# Patient Record
Sex: Female | Born: 1950 | Race: Black or African American | Hispanic: No | Smoking: Never smoker
Health system: Southern US, Community
[De-identification: ages and names within clinical notes are randomized; demographics above are authoritative.]

## PROBLEM LIST (undated history)

## (undated) DIAGNOSIS — I10 Essential (primary) hypertension: Secondary | ICD-10-CM

## (undated) DIAGNOSIS — M1990 Unspecified osteoarthritis, unspecified site: Secondary | ICD-10-CM

## (undated) DIAGNOSIS — D649 Anemia, unspecified: Secondary | ICD-10-CM

## (undated) DIAGNOSIS — E7800 Pure hypercholesterolemia, unspecified: Secondary | ICD-10-CM

## (undated) DIAGNOSIS — T7840XA Allergy, unspecified, initial encounter: Secondary | ICD-10-CM

## (undated) DIAGNOSIS — N189 Chronic kidney disease, unspecified: Secondary | ICD-10-CM

## (undated) DIAGNOSIS — R252 Cramp and spasm: Secondary | ICD-10-CM

## (undated) DIAGNOSIS — I219 Acute myocardial infarction, unspecified: Secondary | ICD-10-CM

## (undated) DIAGNOSIS — G5621 Lesion of ulnar nerve, right upper limb: Secondary | ICD-10-CM

## (undated) DIAGNOSIS — J189 Pneumonia, unspecified organism: Secondary | ICD-10-CM

## (undated) DIAGNOSIS — R400 Somnolence: Secondary | ICD-10-CM

## (undated) DIAGNOSIS — J309 Allergic rhinitis, unspecified: Secondary | ICD-10-CM

## (undated) HISTORY — DX: Allergy, unspecified, initial encounter: T78.40XA

## (undated) HISTORY — DX: Somnolence: R40.0

## (undated) HISTORY — DX: Unspecified osteoarthritis, unspecified site: M19.90

## (undated) HISTORY — DX: Anemia, unspecified: D64.9

## (undated) HISTORY — DX: Cramp and spasm: R25.2

## (undated) HISTORY — DX: Lesion of ulnar nerve, right upper limb: G56.21

## (undated) HISTORY — DX: Allergic rhinitis, unspecified: J30.9

---

## 1998-12-18 DIAGNOSIS — I10 Essential (primary) hypertension: Secondary | ICD-10-CM

## 1998-12-18 HISTORY — DX: Essential (primary) hypertension: I10

## 1999-09-28 ENCOUNTER — Ambulatory Visit (HOSPITAL_COMMUNITY): Admission: RE | Admit: 1999-09-28 | Discharge: 1999-09-28 | Payer: Self-pay | Admitting: Internal Medicine

## 2000-03-28 ENCOUNTER — Emergency Department (HOSPITAL_COMMUNITY): Admission: EM | Admit: 2000-03-28 | Discharge: 2000-03-28 | Payer: Self-pay | Admitting: Emergency Medicine

## 2000-04-09 ENCOUNTER — Emergency Department (HOSPITAL_COMMUNITY): Admission: EM | Admit: 2000-04-09 | Discharge: 2000-04-09 | Payer: Self-pay | Admitting: Emergency Medicine

## 2000-10-29 ENCOUNTER — Emergency Department (HOSPITAL_COMMUNITY): Admission: EM | Admit: 2000-10-29 | Discharge: 2000-10-29 | Payer: Self-pay | Admitting: Emergency Medicine

## 2000-10-29 ENCOUNTER — Encounter: Payer: Self-pay | Admitting: Emergency Medicine

## 2001-04-05 ENCOUNTER — Ambulatory Visit (HOSPITAL_COMMUNITY): Admission: RE | Admit: 2001-04-05 | Discharge: 2001-04-05 | Payer: Self-pay | Admitting: *Deleted

## 2001-04-09 ENCOUNTER — Encounter: Admission: RE | Admit: 2001-04-09 | Discharge: 2001-04-09 | Payer: Self-pay | Admitting: Obstetrics & Gynecology

## 2001-12-12 ENCOUNTER — Encounter: Payer: Self-pay | Admitting: Family Medicine

## 2001-12-12 ENCOUNTER — Ambulatory Visit (HOSPITAL_COMMUNITY): Admission: RE | Admit: 2001-12-12 | Discharge: 2001-12-12 | Payer: Self-pay | Admitting: Family Medicine

## 2003-06-08 ENCOUNTER — Encounter: Admission: RE | Admit: 2003-06-08 | Discharge: 2003-06-08 | Payer: Self-pay | Admitting: Family Medicine

## 2003-06-30 ENCOUNTER — Encounter: Admission: RE | Admit: 2003-06-30 | Discharge: 2003-06-30 | Payer: Self-pay | Admitting: Family Medicine

## 2003-07-02 ENCOUNTER — Encounter: Admission: RE | Admit: 2003-07-02 | Discharge: 2003-07-02 | Payer: Self-pay | Admitting: Family Medicine

## 2003-07-21 ENCOUNTER — Encounter: Admission: RE | Admit: 2003-07-21 | Discharge: 2003-07-21 | Payer: Self-pay | Admitting: Family Medicine

## 2003-07-29 ENCOUNTER — Encounter: Admission: RE | Admit: 2003-07-29 | Discharge: 2003-07-29 | Payer: Self-pay | Admitting: Family Medicine

## 2003-08-18 ENCOUNTER — Encounter: Admission: RE | Admit: 2003-08-18 | Discharge: 2003-08-18 | Payer: Self-pay | Admitting: Family Medicine

## 2003-08-20 ENCOUNTER — Encounter: Admission: RE | Admit: 2003-08-20 | Discharge: 2003-08-20 | Payer: Self-pay | Admitting: Family Medicine

## 2003-09-28 ENCOUNTER — Encounter: Admission: RE | Admit: 2003-09-28 | Discharge: 2003-09-28 | Payer: Self-pay | Admitting: Family Medicine

## 2003-10-12 ENCOUNTER — Encounter: Admission: RE | Admit: 2003-10-12 | Discharge: 2003-10-12 | Payer: Self-pay | Admitting: Family Medicine

## 2003-11-17 ENCOUNTER — Encounter: Admission: RE | Admit: 2003-11-17 | Discharge: 2003-11-17 | Payer: Self-pay | Admitting: Family Medicine

## 2004-01-21 ENCOUNTER — Encounter: Admission: RE | Admit: 2004-01-21 | Discharge: 2004-01-21 | Payer: Self-pay | Admitting: Family Medicine

## 2004-05-05 ENCOUNTER — Encounter: Admission: RE | Admit: 2004-05-05 | Discharge: 2004-05-05 | Payer: Self-pay | Admitting: Sports Medicine

## 2004-08-26 ENCOUNTER — Ambulatory Visit: Payer: Self-pay | Admitting: Family Medicine

## 2004-08-29 ENCOUNTER — Ambulatory Visit: Payer: Self-pay | Admitting: Family Medicine

## 2004-09-23 ENCOUNTER — Ambulatory Visit: Payer: Self-pay | Admitting: Family Medicine

## 2004-12-30 ENCOUNTER — Ambulatory Visit: Payer: Self-pay | Admitting: Sports Medicine

## 2005-01-09 ENCOUNTER — Ambulatory Visit: Payer: Self-pay | Admitting: Family Medicine

## 2005-01-11 ENCOUNTER — Ambulatory Visit: Payer: Self-pay | Admitting: Family Medicine

## 2005-05-13 ENCOUNTER — Emergency Department (HOSPITAL_COMMUNITY): Admission: EM | Admit: 2005-05-13 | Discharge: 2005-05-13 | Payer: Self-pay | Admitting: Emergency Medicine

## 2005-05-18 ENCOUNTER — Ambulatory Visit: Payer: Self-pay | Admitting: Family Medicine

## 2005-05-19 ENCOUNTER — Ambulatory Visit: Payer: Self-pay | Admitting: Family Medicine

## 2005-05-22 ENCOUNTER — Ambulatory Visit: Payer: Self-pay | Admitting: Sports Medicine

## 2005-05-30 ENCOUNTER — Ambulatory Visit: Payer: Self-pay | Admitting: Sports Medicine

## 2005-06-19 ENCOUNTER — Ambulatory Visit: Payer: Self-pay | Admitting: Family Medicine

## 2005-06-27 ENCOUNTER — Ambulatory Visit: Payer: Self-pay | Admitting: Family Medicine

## 2005-06-27 ENCOUNTER — Encounter (INDEPENDENT_AMBULATORY_CARE_PROVIDER_SITE_OTHER): Payer: Self-pay | Admitting: Specialist

## 2005-07-03 ENCOUNTER — Ambulatory Visit: Payer: Self-pay | Admitting: Sports Medicine

## 2005-09-01 ENCOUNTER — Ambulatory Visit (HOSPITAL_COMMUNITY): Admission: RE | Admit: 2005-09-01 | Discharge: 2005-09-01 | Payer: Self-pay | Admitting: Sports Medicine

## 2005-09-14 ENCOUNTER — Encounter: Admission: RE | Admit: 2005-09-14 | Discharge: 2005-09-14 | Payer: Self-pay | Admitting: Sports Medicine

## 2005-11-08 ENCOUNTER — Ambulatory Visit: Payer: Self-pay | Admitting: Family Medicine

## 2006-09-17 ENCOUNTER — Encounter (INDEPENDENT_AMBULATORY_CARE_PROVIDER_SITE_OTHER): Payer: Self-pay | Admitting: *Deleted

## 2006-10-10 ENCOUNTER — Encounter (INDEPENDENT_AMBULATORY_CARE_PROVIDER_SITE_OTHER): Payer: Self-pay | Admitting: *Deleted

## 2006-10-10 ENCOUNTER — Ambulatory Visit: Payer: Self-pay | Admitting: Sports Medicine

## 2006-10-17 ENCOUNTER — Ambulatory Visit: Payer: Self-pay | Admitting: Family Medicine

## 2006-11-30 ENCOUNTER — Ambulatory Visit: Payer: Self-pay | Admitting: Sports Medicine

## 2006-12-07 ENCOUNTER — Ambulatory Visit: Payer: Self-pay | Admitting: Family Medicine

## 2007-02-14 DIAGNOSIS — E669 Obesity, unspecified: Secondary | ICD-10-CM | POA: Insufficient documentation

## 2007-02-14 DIAGNOSIS — M129 Arthropathy, unspecified: Secondary | ICD-10-CM | POA: Insufficient documentation

## 2007-02-14 DIAGNOSIS — I10 Essential (primary) hypertension: Secondary | ICD-10-CM | POA: Insufficient documentation

## 2007-02-14 DIAGNOSIS — G4700 Insomnia, unspecified: Secondary | ICD-10-CM | POA: Insufficient documentation

## 2007-02-14 DIAGNOSIS — E785 Hyperlipidemia, unspecified: Secondary | ICD-10-CM | POA: Insufficient documentation

## 2007-02-14 DIAGNOSIS — E1140 Type 2 diabetes mellitus with diabetic neuropathy, unspecified: Secondary | ICD-10-CM | POA: Insufficient documentation

## 2007-02-15 ENCOUNTER — Encounter (INDEPENDENT_AMBULATORY_CARE_PROVIDER_SITE_OTHER): Payer: Self-pay | Admitting: *Deleted

## 2007-02-19 ENCOUNTER — Telehealth (INDEPENDENT_AMBULATORY_CARE_PROVIDER_SITE_OTHER): Payer: Self-pay

## 2007-02-19 ENCOUNTER — Ambulatory Visit: Payer: Self-pay | Admitting: Family Medicine

## 2007-02-19 ENCOUNTER — Telehealth: Payer: Self-pay | Admitting: *Deleted

## 2007-02-19 DIAGNOSIS — J309 Allergic rhinitis, unspecified: Secondary | ICD-10-CM

## 2007-02-19 HISTORY — DX: Allergic rhinitis, unspecified: J30.9

## 2007-02-22 ENCOUNTER — Telehealth: Payer: Self-pay | Admitting: *Deleted

## 2007-04-29 ENCOUNTER — Ambulatory Visit: Payer: Self-pay | Admitting: Sports Medicine

## 2007-05-01 ENCOUNTER — Ambulatory Visit: Payer: Self-pay | Admitting: Family Medicine

## 2007-10-28 ENCOUNTER — Encounter (INDEPENDENT_AMBULATORY_CARE_PROVIDER_SITE_OTHER): Payer: Self-pay | Admitting: Family Medicine

## 2008-01-20 ENCOUNTER — Encounter (INDEPENDENT_AMBULATORY_CARE_PROVIDER_SITE_OTHER): Payer: Self-pay | Admitting: Family Medicine

## 2008-01-20 ENCOUNTER — Ambulatory Visit: Payer: Self-pay | Admitting: Family Medicine

## 2008-01-20 LAB — CONVERTED CEMR LAB
ALT: 11 units/L (ref 0–35)
Albumin: 4.2 g/dL (ref 3.5–5.2)
BUN: 10 mg/dL (ref 6–23)
Creatinine, Ser: 0.7 mg/dL (ref 0.40–1.20)
Direct LDL: 150 mg/dL — ABNORMAL HIGH
Glucose, Bld: 219 mg/dL — ABNORMAL HIGH (ref 70–99)
Total Protein: 7.2 g/dL (ref 6.0–8.3)

## 2008-01-21 ENCOUNTER — Encounter (INDEPENDENT_AMBULATORY_CARE_PROVIDER_SITE_OTHER): Payer: Self-pay | Admitting: Family Medicine

## 2008-01-24 ENCOUNTER — Telehealth: Payer: Self-pay | Admitting: *Deleted

## 2008-02-04 ENCOUNTER — Telehealth (INDEPENDENT_AMBULATORY_CARE_PROVIDER_SITE_OTHER): Payer: Self-pay | Admitting: Family Medicine

## 2008-02-05 ENCOUNTER — Telehealth: Payer: Self-pay | Admitting: *Deleted

## 2008-02-10 ENCOUNTER — Encounter: Payer: Self-pay | Admitting: *Deleted

## 2008-02-10 ENCOUNTER — Telehealth (INDEPENDENT_AMBULATORY_CARE_PROVIDER_SITE_OTHER): Payer: Self-pay | Admitting: Family Medicine

## 2008-02-11 ENCOUNTER — Telehealth: Payer: Self-pay | Admitting: *Deleted

## 2008-02-12 ENCOUNTER — Telehealth: Payer: Self-pay | Admitting: *Deleted

## 2008-02-19 ENCOUNTER — Encounter: Payer: Self-pay | Admitting: *Deleted

## 2008-02-21 ENCOUNTER — Ambulatory Visit (HOSPITAL_COMMUNITY): Admission: RE | Admit: 2008-02-21 | Discharge: 2008-02-21 | Payer: Self-pay | Admitting: Internal Medicine

## 2008-06-02 ENCOUNTER — Telehealth: Payer: Self-pay | Admitting: *Deleted

## 2008-06-04 ENCOUNTER — Encounter (INDEPENDENT_AMBULATORY_CARE_PROVIDER_SITE_OTHER): Payer: Self-pay | Admitting: Family Medicine

## 2008-07-17 ENCOUNTER — Telehealth: Payer: Self-pay | Admitting: *Deleted

## 2008-11-27 ENCOUNTER — Encounter: Payer: Self-pay | Admitting: Family Medicine

## 2009-06-29 ENCOUNTER — Ambulatory Visit (HOSPITAL_COMMUNITY): Admission: RE | Admit: 2009-06-29 | Discharge: 2009-06-29 | Payer: Self-pay | Admitting: Internal Medicine

## 2009-08-19 ENCOUNTER — Inpatient Hospital Stay (HOSPITAL_COMMUNITY): Admission: EM | Admit: 2009-08-19 | Discharge: 2009-08-21 | Payer: Self-pay | Admitting: Emergency Medicine

## 2009-12-01 ENCOUNTER — Emergency Department (HOSPITAL_COMMUNITY): Admission: EM | Admit: 2009-12-01 | Discharge: 2009-12-01 | Payer: Self-pay | Admitting: Family Medicine

## 2010-01-20 ENCOUNTER — Encounter: Payer: Self-pay | Admitting: Family Medicine

## 2010-01-20 ENCOUNTER — Ambulatory Visit: Payer: Self-pay | Admitting: Family Medicine

## 2010-01-20 LAB — CONVERTED CEMR LAB
ALT: 13 units/L (ref 0–35)
AST: 20 units/L (ref 0–37)
Creatinine, Ser: 0.64 mg/dL (ref 0.40–1.20)
HDL: 46 mg/dL (ref >39)
Hgb A1c MFr Bld: 7.3 %
LDL Cholesterol: 153 mg/dL — ABNORMAL HIGH (ref 0–99)
Potassium: 4.1 meq/L (ref 3.5–5.3)
Sodium: 138 meq/L (ref 135–145)
Total Bilirubin: 0.5 mg/dL (ref 0.3–1.2)
Total Protein: 7.3 g/dL (ref 6.0–8.3)
Triglycerides: 130 mg/dL (ref <150)
VLDL: 26 mg/dL (ref 0–40)

## 2010-03-25 ENCOUNTER — Telehealth: Payer: Self-pay | Admitting: *Deleted

## 2010-04-21 ENCOUNTER — Ambulatory Visit: Payer: Self-pay | Admitting: Family Medicine

## 2010-05-09 ENCOUNTER — Ambulatory Visit: Payer: Self-pay | Admitting: Internal Medicine

## 2010-05-09 LAB — CONVERTED CEMR LAB
AST: 18 units/L (ref 0–37)
Albumin: 4.4 g/dL (ref 3.5–5.2)
Alkaline Phosphatase: 55 units/L (ref 39–117)
Basophils Relative: 0 % (ref 0–1)
CO2: 24 meq/L (ref 19–32)
Calcium: 9.9 mg/dL (ref 8.4–10.5)
Cholesterol: 265 mg/dL — ABNORMAL HIGH (ref 0–200)
Eosinophils Absolute: 0.2 10*3/uL (ref 0.0–0.7)
Eosinophils Relative: 3 % (ref 0–5)
Glucose, Bld: 327 mg/dL — ABNORMAL HIGH (ref 70–99)
HDL: 61 mg/dL (ref >39)
LDL Cholesterol: 172 mg/dL — ABNORMAL HIGH (ref 0–99)
Lymphs Abs: 2.3 10*3/uL (ref 0.7–4.0)
MCHC: 32.1 g/dL (ref 30.0–36.0)
MCV: 86.1 fL (ref 78.0–100.0)
Neutro Abs: 3.1 10*3/uL (ref 1.7–7.7)
Potassium: 4 meq/L (ref 3.5–5.3)
Sodium: 137 meq/L (ref 135–145)
Total Bilirubin: 0.5 mg/dL (ref 0.3–1.2)
WBC: 5.9 10*3/uL (ref 4.0–10.5)

## 2010-05-12 ENCOUNTER — Ambulatory Visit: Payer: Self-pay | Admitting: Internal Medicine

## 2010-05-23 ENCOUNTER — Ambulatory Visit: Payer: Self-pay | Admitting: Internal Medicine

## 2010-06-01 ENCOUNTER — Ambulatory Visit: Payer: Self-pay | Admitting: Family Medicine

## 2010-06-03 ENCOUNTER — Ambulatory Visit: Payer: Self-pay | Admitting: Internal Medicine

## 2010-11-07 ENCOUNTER — Emergency Department (HOSPITAL_COMMUNITY)
Admission: EM | Admit: 2010-11-07 | Discharge: 2010-11-07 | Payer: Self-pay | Source: Home / Self Care | Admitting: Emergency Medicine

## 2010-11-07 ENCOUNTER — Emergency Department (HOSPITAL_COMMUNITY)
Admission: EM | Admit: 2010-11-07 | Discharge: 2010-11-07 | Disposition: A | Discharge status: Other Acute Inpatient Hospital | Payer: Self-pay | Source: Home / Self Care | Admitting: Family Medicine

## 2010-12-09 ENCOUNTER — Ambulatory Visit (HOSPITAL_COMMUNITY)
Admission: RE | Admit: 2010-12-09 | Discharge: 2010-12-09 | Payer: Self-pay | Source: Home / Self Care | Attending: Family Medicine | Admitting: Family Medicine

## 2011-01-19 NOTE — Assessment & Plan Note (Signed)
Summary: f/u HTN, HLD, DM   Vital Signs:  Patient profile:   60 year old female Height:      66 inches Weight:      219.1 pounds BMI:     35.49 Temp:     97.9 degrees F oral Pulse rate:   83 / minute Pulse rhythm:   regular BP sitting:   159 / 80  (right arm) Cuff size:   large  Vitals Entered By: Audelia Hives CMA (January 20, 2010 11:02 AM)  Nutrition Counseling: Patient's BMI is greater than 25 and therefore counseled on weight management options.  CC:  re-establish/follow up DM, HTN, and HLD.  History of Present Illness: 60 y/o female here to re-establish care/follow up chronic issues. no complaints  DM - doesn't check blood sugars and has been out of glucophage for a while.  denies polyuria, polydipsia.   HTN: No shortness of breath, chest pain, edema.  has been out of BP med. took daughters benicar.   HLD: previously on pravastatin. ran out of that as well.   Obesity- lost  ~16 lbs since last visit. eats a lot of chocolate. not doing as much exercise 2/2 weather  Current Medications (verified): 1)  None  Allergies (verified): No Known Drug Allergies  Past History:  Past medical history reviewed for relevance to current acute and chronic problems.  Past Medical History: Allergic rhinitis HTN HLD DM  Social History: Lives alone.  out of work.  No etoh/drugs/tob.  Physical Exam  General:  obese female, NAD. vitals reviewed.  Eyes:  EOMI, PERRLA Ears:  hearing grossly normal Nose:  no rhinorrhea Mouth:  MMM Neck:  no carotid bruits or thyromegaly Lungs:  Normal respiratory effort, chest expands symmetrically. Lungs are clear to auscultation, no crackles or wheezes. Heart:  Normal rate and regular rhythm. S1 and S2 normal without gallop, murmur, click, rub or other extra sounds. Abdomen:  obese, NT, ND, +BS Extremities:  No clubbing, cyanosis, edema, or deformity noted with normal full range of motion of all joints.     Impression &  Recommendations:  Problem # 1:  DIABETES MELLITUS II, UNCOMPLICATED ([id number]) Assessment Improved A1C improved despite being off medication. will refill glucophage. unable to refer for eye testing since uninsured. will check microalbumin and feet at next visit  The following medications were removed from the medication list:    Glucophage 500 Mg Tabs (Metformin hcl) .Marland Kitchen... Take 1 tablet by mouth twice a day    Zestoretic 10-12.5 Mg Tabs (Lisinopril-hydrochlorothiazide)  Orders: Comp Met-[facility] [id number]) Lipid-[facility] [id number]) A1C-[facility] [id number]) [place]- Est  Level 4 [id number])  Problem # 2:  HYPERTENSION, BENIGN SYSTEMIC (ICD-401.1) Assessment: Unchanged  refill medication.   The following medications were removed from the medication list:    Zestoretic 10-12.5 Mg Tabs (Lisinopril-hydrochlorothiazide)  Orders: [place]- Est  Level 4 [id number])  Problem # 3:  HYPERLIPIDEMIA (ICD-272.4) Assessment: Unchanged  check direct LDL  The following medications were removed from the medication list:    Pravachol 20 Mg Tabs (Pravastatin sodium) .Marland KitchenMarland KitchenMarland KitchenMarland Kitchen 4 by mouth qhs  Orders: [place]- Est  Level 4 [id number])  Patient Instructions: 1)  Nice to have met you! 2)  Follow up with Dr. Drue Flirt in 6 weeks.    Prevention & Chronic Care Immunizations   Influenza vaccine: Not documented    Tetanus booster: 06/17/2005: Done.   Tetanus booster due: 06/18/2015    Pneumococcal vaccine: Not documented  Colorectal Screening   Hemoccult: Done.  (09/17/2006)   Hemoccult  due: 09/18/2007    Colonoscopy: Not documented  Other Screening   Pap smear: normal  (01/20/2008)   Pap smear due: 01/2009    Mammogram: normal  (02/26/2008)   Mammogram due: 02/25/2009   Smoking status: never  (02/19/2007)  Diabetes Mellitus   HgbA1C: 7.3  (01/20/2010)   Hemoglobin A1C due: 04/18/2008    Eye exam: Not documented    Foot exam: Not documented   High risk foot: Not documented   Foot care education: Not  documented    Urine microalbumin/creatinine ratio: Not documented    Diabetes flowsheet reviewed?: Yes   Progress toward A1C goal: Improved  Lipids   Total Cholesterol: Not documented   LDL: Not documented   LDL Direct: 150  (01/20/2008)   HDL: Not documented   Triglycerides: Not documented    SGOT (AST): 17  (01/20/2008)   SGPT (ALT): 11  (01/20/2008) CMP ordered    Alkaline phosphatase: 62  (01/20/2008)   Total bilirubin: 0.6  (01/20/2008)    Lipid flowsheet reviewed?: Yes   Progress toward LDL goal: Unchanged  Hypertension   Last Blood Pressure: 159 / 80  (01/20/2010)   Serum creatinine: 0.70  (01/20/2008)   Serum potassium 3.7  (01/20/2008) CMP ordered     Hypertension flowsheet reviewed?: Yes   Progress toward BP goal: Improved  Self-Management Support :   Personal Goals (by the next clinic visit) :     Personal A1C goal: 7  (01/20/2010)     Personal blood pressure goal: 130/80  (01/20/2010)     Personal LDL goal: 100  (01/20/2010)    Patient will work on the following items until the next clinic visit to reach self-care goals:     Medications and monitoring: take my medicines every day, check my blood sugar, examine my feet every day  (01/20/2010)     Eating: eat foods that are low in salt, eat baked foods instead of fried foods, eat fruit for snacks and desserts  (01/20/2010)     Activity: take a 30 minute walk every day  (01/20/2010)    Diabetes self-management support: Copy of home glucose meter record, CBG self-monitoring log, Written self-care plan  (01/20/2010)   Diabetes care plan printed    Hypertension self-management support: BP self-monitoring log, Written self-care plan  (01/20/2010)   Hypertension self-care plan printed.    Lipid self-management support: Lipid monitoring log, Written self-care plan  (01/20/2010)   Lipid self-care plan printed.   Laboratory Results   Blood Tests   Date/Time Recieved: January 20, 2010 11:25 AM  Date/Time  Reported: January 20, 2010 3:31 PM   HGBA1C: 7.3%   (Normal Range: Non-Diabetic - 3-6%   Control Diabetic - 6-8%)  Comments: ...............test performed by......Marland KitchenBonnie A. Martinique, MLS (ASCP)cm

## 2011-01-19 NOTE — Progress Notes (Signed)
Summary: cxl appt  Phone Note Call from Patient Call back at Home Phone [phone]   Caller: Patient Summary of Call: pt called to cancel appt for today and has an appt at [facility] on 5/5.  She is going to switch to them because of health problems with her daughter?  Initial call taken by: Audie Clear,  March 25, 2010 9:22 AM  Follow-up for Phone Call        I will make the changes in [facility] and [place]. Follow-up by: Elray Mcgregor RN,  March 26, 2010 8:08 AM    noted.  Nancy Nordmann  MD  March 25, 2010 9:31 AM

## 2011-02-28 LAB — DIFFERENTIAL
Eosinophils Absolute: 0.1 10*3/uL (ref 0.0–0.7)
Eosinophils Relative: 1 % (ref 0–5)
Lymphs Abs: 3.2 10*3/uL (ref 0.7–4.0)
Monocytes Absolute: 0.4 10*3/uL (ref 0.1–1.0)
Monocytes Relative: 5 % (ref 3–12)

## 2011-02-28 LAB — COMPREHENSIVE METABOLIC PANEL
ALT: 13 U/L (ref 0–35)
Albumin: 4 g/dL (ref 3.5–5.2)
BUN: 9 mg/dL (ref 6–23)
CO2: 29 mEq/L (ref 19–32)
Calcium: 9.8 mg/dL (ref 8.4–10.5)
Chloride: 103 mEq/L (ref 96–112)
Glucose, Bld: 61 mg/dL — ABNORMAL LOW (ref 70–99)
Potassium: 2.9 mEq/L — ABNORMAL LOW (ref 3.5–5.1)
Sodium: 139 mEq/L (ref 135–145)
Total Bilirubin: 0.4 mg/dL (ref 0.3–1.2)

## 2011-02-28 LAB — GLUCOSE, CAPILLARY: Glucose-Capillary: 63 mg/dL — ABNORMAL LOW (ref 70–99)

## 2011-02-28 LAB — CK TOTAL AND CKMB (NOT AT ARMC)
CK, MB: 1.1 ng/mL (ref 0.3–4.0)
Total CK: 95 U/L (ref 7–177)

## 2011-02-28 LAB — TROPONIN I: Troponin I: 0.01 ng/mL (ref 0.00–0.06)

## 2011-02-28 LAB — CBC
MCV: 87.5 fL (ref 78.0–100.0)
Platelets: 313 10*3/uL (ref 150–400)
RDW: 13.2 % (ref 11.5–15.5)

## 2011-03-24 LAB — CLOSTRIDIUM DIFFICILE EIA
C difficile Toxins A+B, EIA: NEGATIVE
C difficile Toxins A+B, EIA: NEGATIVE

## 2011-03-24 LAB — STOOL CULTURE

## 2011-03-24 LAB — CBC
HCT: 30.1 % — ABNORMAL LOW (ref 36.0–46.0)
HCT: 36.4 % (ref 36.0–46.0)
Hemoglobin: 12.1 g/dL (ref 12.0–15.0)
Hemoglobin: 13.3 g/dL (ref 12.0–15.0)
MCHC: 33.1 g/dL (ref 30.0–36.0)
MCHC: 33.4 g/dL (ref 30.0–36.0)
MCV: 88 fL (ref 78.0–100.0)
MCV: 88.1 fL (ref 78.0–100.0)
MCV: 88.2 fL (ref 78.0–100.0)
Platelets: 255 10*3/uL (ref 150–400)
Platelets: 304 10*3/uL (ref 150–400)
RBC: 4.14 MIL/uL (ref 3.87–5.11)
WBC: 14.4 10*3/uL — ABNORMAL HIGH (ref 4.0–10.5)
WBC: 18.9 10*3/uL — ABNORMAL HIGH (ref 4.0–10.5)
WBC: 23.6 10*3/uL — ABNORMAL HIGH (ref 4.0–10.5)

## 2011-03-24 LAB — COMPREHENSIVE METABOLIC PANEL
ALT: 15 U/L (ref 0–35)
AST: 26 U/L (ref 0–37)
Albumin: 3.4 g/dL — ABNORMAL LOW (ref 3.5–5.2)
Alkaline Phosphatase: 86 U/L (ref 39–117)
BUN: 13 mg/dL (ref 6–23)
Calcium: 8.9 mg/dL (ref 8.4–10.5)
Chloride: 101 mEq/L (ref 96–112)
Creatinine, Ser: 0.75 mg/dL (ref 0.4–1.2)
GFR calc Af Amer: >60 mL/min (ref >60)
Potassium: 2.8 mEq/L — ABNORMAL LOW (ref 3.5–5.1)
Sodium: 136 mEq/L (ref 135–145)
Total Bilirubin: 0.4 mg/dL (ref 0.3–1.2)
Total Protein: 7.2 g/dL (ref 6.0–8.3)

## 2011-03-24 LAB — LACTIC ACID, PLASMA: Lactic Acid, Venous: 1 mmol/L (ref 0.5–2.2)

## 2011-03-24 LAB — URINALYSIS, ROUTINE W REFLEX MICROSCOPIC
Specific Gravity, Urine: 1.027 (ref 1.005–1.030)
pH: 6 (ref 5.0–8.0)

## 2011-03-24 LAB — DIFFERENTIAL
Basophils Relative: 0 % (ref 0–1)
Lymphocytes Relative: 21 % (ref 12–46)
Lymphs Abs: 5 10*3/uL — ABNORMAL HIGH (ref 0.7–4.0)
Monocytes Relative: 2 % — ABNORMAL LOW (ref 3–12)

## 2011-03-24 LAB — OVA AND PARASITE EXAMINATION
Ova and parasites: NONE SEEN
Ova and parasites: NONE SEEN

## 2011-03-24 LAB — BASIC METABOLIC PANEL
BUN: 6 mg/dL (ref 6–23)
CO2: 27 mEq/L (ref 19–32)
Chloride: 107 mEq/L (ref 96–112)
Chloride: 112 mEq/L (ref 96–112)
GFR calc Af Amer: >60 mL/min (ref >60)
GFR calc non Af Amer: >60 mL/min (ref >60)
Potassium: 3.4 mEq/L — ABNORMAL LOW (ref 3.5–5.1)
Potassium: 3.6 mEq/L (ref 3.5–5.1)
Sodium: 139 mEq/L (ref 135–145)

## 2011-03-24 LAB — GLUCOSE, CAPILLARY
Glucose-Capillary: 126 mg/dL — ABNORMAL HIGH (ref 70–99)
Glucose-Capillary: 132 mg/dL — ABNORMAL HIGH (ref 70–99)
Glucose-Capillary: 216 mg/dL — ABNORMAL HIGH (ref 70–99)

## 2011-03-24 LAB — HEMOGLOBIN A1C
Hgb A1c MFr Bld: 7.9 % — ABNORMAL HIGH (ref 4.6–6.1)
Mean Plasma Glucose: 180 mg/dL

## 2011-03-24 LAB — HEMOCCULT GUIAC POC 1CARD (OFFICE): Fecal Occult Bld: NEGATIVE

## 2011-03-24 LAB — MAGNESIUM: Magnesium: 1.6 mg/dL (ref 1.5–2.5)

## 2011-05-02 NOTE — Consult Note (Signed)
NAMEMARLA, DELGIUDICE             ACCOUNT NO.:  [id number]   MEDICAL RECORD NO.:  [id number]          PATIENT TYPE:  EMS   LOCATION:  ED                           FACILITY:  [facility]   PHYSICIAN:  John C. Amedeo Plenty, M.D.    DATE OF BIRTH:  September 08, 1951   DATE OF CONSULTATION:  08/19/2009  DATE OF DISCHARGE:                                 CONSULTATION   REASON FOR CONSULTATION:  Diarrhea, leukocytosis and mesenteric  adenitis.   HISTORY OF PRESENT ILLNESS:  The patient is a 60 year old black female  who presents with a 1-1/2-week history of watery nonbloody diarrhea.  She has had nocturnal diarrhea and some episodes of incontinence,  particularly this morning.  She has had occasional vomiting primarily  near the onset which has resolved with some residual nausea.  She has  also had  fairly mild intermittent epigastric pain usually after eating.  She denies any fever or blood.  She denies any recent antibiotics,  recent travel, does not get her drinking water from a well, denies any  sick contacts or suspicious food ingestion that she had can recall.  Her  evaluation revealed a WBC count of 23,600, potassium 2.8 and a CT scan  showing some mesenteric adenopathy around the right colon as well as a  distended gallbladder with no other findings.  Her stool was heme  negative.  She has never had previous similar symptoms before.   PAST MEDICAL HISTORY:  Hypertension and non-insulin-dependent diabetes.   PAST SURGICAL HISTORY:  None.   MEDICATIONS:  Lisinopril and metformin.   ALLERGIES:  Levaquin.   FAMILY HISTORY:  Positive for diabetes and hypertension.  Grandmother  had breast cancer.   SOCIAL HISTORY:  The patient denies alcohol or tobacco use.   PHYSICAL EXAMINATION:  Obese black female alert, oriented, in no acute  distress.  HEART:  Regular rate and rhythm without murmur.  LUNGS:  Clear.  ABDOMEN:  Soft, nondistended with normoactive bowel sounds.  No  hepatosplenomegaly, mass  or guarding.  There is only some mild  tenderness in the suprapubic area.  Otherwise nontender.   IMPRESSION:  Probable bacterial enterocolitis or parasitic  enterocolitis.   PLAN:  Obtain full stool studies for enteric pathogens and then  recommend broad-spectrum antibiotics with Cipro and Flagyl or their  equivalent.           ______________________________  Elyse Jarvis. Amedeo Plenty, M.D.     JCH/MEDQ  D:  08/19/2009  T:  08/19/2009  Job:  [id number]

## 2011-05-02 NOTE — Consult Note (Signed)
NAMEJENNIFFER, Simmons             ACCOUNT NO.:  [id number]   MEDICAL RECORD NO.:  [id number]          PATIENT TYPE:  INP   LOCATION:  0108                         FACILITY:  [facility]   PHYSICIAN:  Marland Kitchen T. Simmons, M.D.DATE OF BIRTH:  May 11, 1951   DATE OF CONSULTATION:  08/19/2009  DATE OF DISCHARGE:                                 CONSULTATION   CONSULT REQUESTED BY:  [facility] Team.   CHIEF COMPLAINT:  Diarrhea, nausea.   PRESENT ILLNESS:  I was asked by the hospitalist team to evaluate Ms.  Simmons due to a questionably abnormal gallbladder seen on CT scan today.  The patient presents with a chief complaint of diarrhea which has been  present for 1 week.  She describes several loose or watery stools a day  without mucous or blood.  This has been associated with nausea and some  rare vomiting.  This has been going on for about a week and she  therefore presented to the emergency room.  She denies fever or chills.  There is no hematochezia.  On questioning she has been having some  epigastric pressure discomfort during this illness which appears to be  relieved when she has a bowel movement.  She however denies having any  discomfort over the past 24 hours.  She has no previous history of  abdominal pain or significant chronic GI complaints.  No fever or  chills.  No jaundice noted.   PAST MEDICAL HISTORY:  SURGERY:  None.   MEDICAL:  She is followed for:  1. Hypertension.  2. Diabetes.   MEDICATIONS:  1. Lisinopril 10 twice daily.  2. Metformin 500 twice daily.   ALLERGIES:  None.   SOCIAL HISTORY:  She is employed.  No cigarettes or alcohol.   FAMILY HISTORY:  Noncontributory.   REVIEW OF SYSTEMS:  GENERAL:  No fever, chills, weight change.  RESPIRATORY:  No shortness of breath, cough, wheezing.  CARDIAC:  No  chest pain, history of heart disease.  ABDOMEN:  GI as above.  GU:  No  urinary burning, frequency, hematuria.   PHYSICAL EXAM:  She is currently  afebrile.  VITAL SIGNS:  Within normal limits.  GENERAL:  Mildly obese, African American female in no acute distress.  SKIN:  Warm and dry.  No rash or infection.  HEENT:  No palpable mass or thyromegaly.  Sclerae nonicteric.  LUNGS:  Clear without wheezing or increased work of breathing.  CARDIAC:  Regular rate and rhythm.  No murmurs.  ABDOMEN:  Nondistended.  No scars.  No hernias.  There is mild  epigastric tenderness without guarding.  No tenderness in the right  upper quadrant.  No discernible masses or organomegaly.   LABORATORY:  White blood count is significantly elevated 23.6 thousand,  hemoglobin is 13.3, potassium is 2.8 and being replaced.  LFTs all  within normal limits.  Albumin 3.4.   CT scan of the abdomen and pelvis is reviewed.  The gallbladder is  moderately distended but there is no evidence of stones.  No wall  thickening.  No pericholecystic fluid or edema.  There are a  few mildly  enlarged lymph nodes around the mesentery of the right colon, suggesting  possibly mesenteric adenitis.   ASSESSMENT/PLAN:  A 59 year old female with 1-week illness manifested  primarily with frequent diarrhea, nausea and occasional vomiting.  She  has some upper abdominal mild pain or pressure discomfort but this is a  minor component of her illness and she denies any pain today.  I see no  evidence for acute cholecystitis.  I believe her gallbladder is likely  physiologically distended secondary to her GI illness and reduced p.o.  intake.  This all seems most consistent with an enterocolitis.  She is  being worked up and treated for this.   I do not think that any further intervention or workup for her  gallbladder is required as long as her illness resolves as expected for  an enterocolitis.  If however, she has persistent symptoms despite  appropriate treatment for enterocolitis then you could proceed with a  gallbladder ultrasound and/or HIDA scan to further evaluate the   gallbladder, but I doubt this will be necessary.  I strongly doubt  gallbladder disease.   I will not plan to follow the patient routinely.  Please reconsult if  she does not respond appropriately.      Veronica Simmons, M.D.  Electronically Signed     BTH/MEDQ  D:  08/19/2009  T:  08/19/2009  Job:  [id number]

## 2011-08-11 LAB — TB SKIN TEST
Induration: 0 mm
TB Skin Test: NEGATIVE

## 2011-09-08 ENCOUNTER — Other Ambulatory Visit (HOSPITAL_COMMUNITY): Payer: Self-pay | Admitting: Family Medicine

## 2011-09-08 ENCOUNTER — Ambulatory Visit (HOSPITAL_COMMUNITY)
Admission: RE | Admit: 2011-09-08 | Discharge: 2011-09-08 | Disposition: A | Discharge status: Home / Self Care | Payer: Self-pay | Source: Ambulatory Visit | Attending: Family Medicine | Admitting: Family Medicine

## 2011-09-08 DIAGNOSIS — R52 Pain, unspecified: Secondary | ICD-10-CM

## 2011-09-08 DIAGNOSIS — M7750 Other enthesopathy of unspecified foot: Secondary | ICD-10-CM | POA: Insufficient documentation

## 2011-09-08 DIAGNOSIS — M79609 Pain in unspecified limb: Secondary | ICD-10-CM | POA: Insufficient documentation

## 2011-12-08 ENCOUNTER — Encounter: Payer: Self-pay | Admitting: Emergency Medicine

## 2011-12-08 ENCOUNTER — Emergency Department (HOSPITAL_COMMUNITY)
Admission: EM | Admit: 2011-12-08 | Discharge: 2011-12-09 | Disposition: A | Discharge status: Home / Self Care | Payer: Self-pay | Attending: Emergency Medicine | Admitting: Emergency Medicine

## 2011-12-08 ENCOUNTER — Other Ambulatory Visit: Payer: Self-pay

## 2011-12-08 DIAGNOSIS — I10 Essential (primary) hypertension: Secondary | ICD-10-CM | POA: Insufficient documentation

## 2011-12-08 DIAGNOSIS — E119 Type 2 diabetes mellitus without complications: Secondary | ICD-10-CM | POA: Insufficient documentation

## 2011-12-08 DIAGNOSIS — Z79899 Other long term (current) drug therapy: Secondary | ICD-10-CM | POA: Insufficient documentation

## 2011-12-08 DIAGNOSIS — J329 Chronic sinusitis, unspecified: Secondary | ICD-10-CM | POA: Insufficient documentation

## 2011-12-08 DIAGNOSIS — R0602 Shortness of breath: Secondary | ICD-10-CM | POA: Insufficient documentation

## 2011-12-08 DIAGNOSIS — R51 Headache: Secondary | ICD-10-CM | POA: Insufficient documentation

## 2011-12-08 HISTORY — DX: Essential (primary) hypertension: I10

## 2011-12-08 NOTE — ED Notes (Signed)
Patient states that she has had minimal to eat today. Patient states that she started to have "facial pressure" today. The patient has had these symptoms x 3 days. The patient states that this pressure indicates that she has high blood pressure

## 2011-12-08 NOTE — ED Notes (Signed)
Pt st's she has SOB and lightheadedness related to her high bp, st's she has felt this way the entire week.  St's she takes 25mg  of lisinopril once a day, she has taken her dose today.  Denies n/v/d.  Denies chest pain.  St's she woke up and her jaw was hurting, st's it's starting to hurt again.

## 2011-12-09 MED ORDER — AZITHROMYCIN 250 MG PO TABS: ORAL_TABLET | ORAL | Status: AC

## 2011-12-09 MED ORDER — AZITHROMYCIN 250 MG PO TABS
500.0000 mg | ORAL_TABLET | Freq: Once | ORAL | Status: AC
  Administered 2011-12-09: 500 mg via ORAL
  Filled 2011-12-09 (×2): qty 1

## 2011-12-09 MED ORDER — AMLODIPINE BESYLATE 10 MG PO TABS
10.0000 mg | ORAL_TABLET | Freq: Once | ORAL | Status: AC
  Administered 2011-12-09: 10 mg via ORAL
  Filled 2011-12-09: qty 1

## 2011-12-09 MED ORDER — AMLODIPINE BESYLATE 10 MG PO TABS: 10.0000 mg | ORAL_TABLET | Freq: Every day | ORAL | Status: DC

## 2011-12-09 NOTE — ED Notes (Signed)
BP after norvasc: 172/80

## 2011-12-09 NOTE — ED Provider Notes (Signed)
Medical screening examination/treatment/procedure(s) were performed by non-physician practitioner and as supervising physician I was immediately available for consultation/collaboration.   Wynetta Fines, MD 12/09/11 [phone]

## 2011-12-09 NOTE — ED Provider Notes (Signed)
History     CSN: [id number]  Arrival date & time 12/08/11  2159   First MD Initiated Contact with Patient 12/08/11 2348      Chief Complaint  Patient presents with  . Hypertension  . Facial Pain    L sided and sinus  . Shortness of Breath    w/ exertion    (Consider location/radiation/quality/duration/timing/severity/associated sxs/prior treatment) HPI Comments: Patient here with facial pain and pressure - she states that this started about 5 days ago - she reports bilateral ear pain, denies headache - states runny nose with green drainage and nasal stuffiness - states that her blood pressure has been running high as well over the past week - denies headache, chest pain, shortness of breath - numbness or tingling.  Patient is a 60 y.o. female presenting with hypertension and shortness of breath. The history is provided by the patient. No language interpreter was used.  Hypertension This is a chronic problem. The current episode started in the past 7 days. The problem occurs constantly. The problem has been unchanged. Associated symptoms include congestion. Pertinent negatives include no abdominal pain, anorexia, change in bowel habit, chest pain, coughing, diaphoresis, fatigue, fever, headaches, myalgias, nausea, neck pain, numbness, sore throat, swollen glands, vertigo, visual change, vomiting or weakness. The symptoms are aggravated by nothing. She has tried nothing for the symptoms. The treatment provided no relief.  Shortness of Breath  Associated symptoms include shortness of breath. Pertinent negatives include no chest pain, no fever, no sore throat and no cough.    Past Medical History  Diagnosis Date  . Hypertension   . Diabetes mellitus     No past surgical history on file.  No family history on file.  History  Substance Use Topics  . Smoking status: Never Smoker   . Smokeless tobacco: Not on file  . Alcohol Use: No    OB History    Grav Para Term Preterm  Abortions TAB SAB Ect Mult Living                  Review of Systems  Constitutional: Negative for fever, diaphoresis and fatigue.  HENT: Positive for congestion. Negative for sore throat and neck pain.   Respiratory: Positive for shortness of breath. Negative for cough.   Cardiovascular: Negative for chest pain.  Gastrointestinal: Negative for nausea, vomiting, abdominal pain, anorexia and change in bowel habit.  Musculoskeletal: Negative for myalgias.  Neurological: Negative for vertigo, weakness, numbness and headaches.  All other systems reviewed and are negative.    Allergies  Levaquin  Home Medications   Current Outpatient Rx  Name Route Sig Dispense Refill  . GLYBURIDE 5 MG PO TABS Oral Take 5 mg by mouth daily with breakfast.      . LISINOPRIL 10 MG PO TABS Oral Take 10 mg by mouth daily.      Marland Kitchen METFORMIN HCL 1000 MG PO TABS Oral Take 500 mg by mouth 2 (two) times daily with a meal.       BP 172/80  Pulse 73  Temp(Src) 98.7 F (37.1 C) (Oral)  Resp 20  SpO2 97%  Physical Exam  Nursing note and vitals reviewed. Constitutional: She is oriented to person, place, and time. She appears well-developed and well-nourished. No distress.       hypertensive  HENT:  Head: Atraumatic.  Right Ear: External ear normal.  Left Ear: External ear normal.  Nose: Mucosal edema present. No nasal deformity. Right sinus exhibits maxillary sinus tenderness  and frontal sinus tenderness. Left sinus exhibits maxillary sinus tenderness and frontal sinus tenderness.  Mouth/Throat: Oropharynx is clear and moist. No oropharyngeal exudate.  Eyes: Conjunctivae are normal. Pupils are equal, round, and reactive to light.  Neck: Normal range of motion. Neck supple.  Cardiovascular: Normal rate, regular rhythm and normal heart sounds.  Exam reveals no gallop and no friction rub.   No murmur heard. Pulmonary/Chest: Effort normal and breath sounds normal. No respiratory distress. She exhibits no  tenderness.  Abdominal: Soft. Bowel sounds are normal. She exhibits no distension. There is no tenderness.  Musculoskeletal: Normal range of motion.  Lymphadenopathy:    She has no cervical adenopathy.  Neurological: She is alert and oriented to person, place, and time. No cranial nerve deficit.  Skin: Skin is warm and dry.  Psychiatric: She has a normal mood and affect. Her behavior is normal. Judgment and thought content normal.    ED Course  Procedures (including critical care time)  Labs Reviewed - No data to display No results found.   Sinusitis hypertenstion  MDM  Patient here with sinusitis - although this may be the cause of the increase in blood pressure - I think she likely will need the addition of a second agent - have given norvasc here and will place the patient on the same daily - she will follow up with PCP Dr. Brigitte Pulse this coming week and will continue to monitor her pressure.        Idalia Needle [place], [place] 12/09/11 [phone]

## 2012-04-29 LAB — TSH: TSH: 0.8

## 2012-05-27 ENCOUNTER — Emergency Department (HOSPITAL_COMMUNITY): Payer: Self-pay

## 2012-05-27 ENCOUNTER — Emergency Department (HOSPITAL_COMMUNITY)
Admission: EM | Admit: 2012-05-27 | Discharge: 2012-05-28 | Disposition: A | Discharge status: Home / Self Care | Payer: Self-pay | Attending: Emergency Medicine | Admitting: Emergency Medicine

## 2012-05-27 ENCOUNTER — Encounter (HOSPITAL_COMMUNITY): Payer: Self-pay | Admitting: *Deleted

## 2012-05-27 DIAGNOSIS — R5383 Other fatigue: Secondary | ICD-10-CM

## 2012-05-27 DIAGNOSIS — I10 Essential (primary) hypertension: Secondary | ICD-10-CM | POA: Insufficient documentation

## 2012-05-27 DIAGNOSIS — R509 Fever, unspecified: Secondary | ICD-10-CM | POA: Insufficient documentation

## 2012-05-27 DIAGNOSIS — R5381 Other malaise: Secondary | ICD-10-CM | POA: Insufficient documentation

## 2012-05-27 DIAGNOSIS — E119 Type 2 diabetes mellitus without complications: Secondary | ICD-10-CM | POA: Insufficient documentation

## 2012-05-27 LAB — COMPREHENSIVE METABOLIC PANEL
ALT: 11 U/L (ref 0–35)
AST: 18 U/L (ref 0–37)
Albumin: 4.1 g/dL (ref 3.5–5.2)
Alkaline Phosphatase: 68 U/L (ref 39–117)
BUN: 13 mg/dL (ref 6–23)
CO2: 25 mEq/L (ref 19–32)
Calcium: 10.2 mg/dL (ref 8.4–10.5)
Chloride: 92 mEq/L — ABNORMAL LOW (ref 96–112)
Creatinine, Ser: 0.78 mg/dL (ref 0.50–1.10)
GFR calc Af Amer: >90 mL/min (ref >90)
GFR calc non Af Amer: 88 mL/min — ABNORMAL LOW (ref >90)
Glucose, Bld: 305 mg/dL — ABNORMAL HIGH (ref 70–99)
Potassium: 3.6 mEq/L (ref 3.5–5.1)
Sodium: 131 mEq/L — ABNORMAL LOW (ref 135–145)
Total Bilirubin: 0.5 mg/dL (ref 0.3–1.2)
Total Protein: 8.2 g/dL (ref 6.0–8.3)

## 2012-05-27 LAB — URINALYSIS, ROUTINE W REFLEX MICROSCOPIC
Bilirubin Urine: NEGATIVE
Glucose, UA: >1000 mg/dL — AB
Ketones, ur: NEGATIVE mg/dL
Leukocytes, UA: NEGATIVE
Nitrite: NEGATIVE
Protein, ur: 100 mg/dL — AB
Specific Gravity, Urine: 1.025 (ref 1.005–1.030)
Urobilinogen, UA: 1 mg/dL (ref 0.0–1.0)
pH: 7.5 (ref 5.0–8.0)

## 2012-05-27 LAB — CBC
HCT: 38.8 % (ref 36.0–46.0)
Hemoglobin: 12.9 g/dL (ref 12.0–15.0)
MCH: 28.4 pg (ref 26.0–34.0)
MCHC: 33.2 g/dL (ref 30.0–36.0)
MCV: 85.3 fL (ref 78.0–100.0)
Platelets: 271 10*3/uL (ref 150–400)
RBC: 4.55 MIL/uL (ref 3.87–5.11)
RDW: 12.4 % (ref 11.5–15.5)
WBC: 13.2 10*3/uL — ABNORMAL HIGH (ref 4.0–10.5)

## 2012-05-27 LAB — URINE MICROSCOPIC-ADD ON

## 2012-05-27 LAB — GLUCOSE, CAPILLARY: Glucose-Capillary: 299 mg/dL — ABNORMAL HIGH (ref 70–99)

## 2012-05-27 LAB — LACTIC ACID, PLASMA: Lactic Acid, Venous: 1.9 mmol/L (ref 0.5–2.2)

## 2012-05-27 MED ORDER — ACETAMINOPHEN 650 MG RE SUPP
650.0000 mg | Freq: Once | RECTAL | Status: AC
  Administered 2012-05-27: 650 mg via RECTAL
  Filled 2012-05-27: qty 1

## 2012-05-27 MED ORDER — LIDOCAINE HCL 1 % IJ SOLN
INTRAMUSCULAR | Status: AC
  Filled 2012-05-27: qty 20

## 2012-05-27 MED ORDER — ONDANSETRON HCL 4 MG/2ML IJ SOLN
4.0000 mg | Freq: Once | INTRAMUSCULAR | Status: AC
  Administered 2012-05-27: 4 mg via INTRAVENOUS
  Filled 2012-05-27: qty 2

## 2012-05-27 MED ORDER — IOHEXOL 300 MG/ML  SOLN
100.0000 mL | Freq: Once | INTRAMUSCULAR | Status: AC | PRN
  Administered 2012-05-27: 100 mL via INTRAVENOUS

## 2012-05-27 MED ORDER — LIDOCAINE HCL (PF) 1 % IJ SOLN
5.0000 mL | Freq: Once | INTRAMUSCULAR | Status: AC
  Administered 2012-05-27: 5 mL

## 2012-05-27 MED ORDER — SODIUM CHLORIDE 0.9 % IV BOLUS (SEPSIS)
2000.0000 mL | Freq: Once | INTRAVENOUS | Status: AC
  Administered 2012-05-27: 1000 mL via INTRAVENOUS

## 2012-05-27 NOTE — ED Provider Notes (Addendum)
History    61 year old female with decreased mental status. Patient woke up this morning feeling well but more tired than usual, but otherwise her usual state of health. Throughout the day.her noticed that she had become increasingly more tired. Sleeping throughout the day which is very unusual for her. Patient was incontinent of urine because she said she couldn't get up to use the bathroom. No urinary complaints otherwise. Vomiting just prior to arrival. Patient currently denies any pain. She's not sure why she is so tired. Slightly nauseated. No fevers or chills. No neck pain or neck stiffness. She denies any trauma. States that she's taking her medicines as prescribed. Denies any ingestion.  CSN: [id number]  Arrival date & time 05/27/12  2012   First MD Initiated Contact with Patient 05/27/12 2030      No chief complaint on file.   (Consider location/radiation/quality/duration/timing/severity/associated sxs/prior treatment) HPI  Past Medical History  Diagnosis Date  . Hypertension   . Diabetes mellitus     History reviewed. No pertinent past surgical history.  No family history on file.  History  Substance Use Topics  . Smoking status: Never Smoker   . Smokeless tobacco: Not on file  . Alcohol Use: No    OB History    Grav Para Term Preterm Abortions TAB SAB Ect Mult Living                  Review of Systems   Review of symptoms negative unless otherwise noted in HPI.   Allergies  Levofloxacin  Home Medications   Current Outpatient Rx  Name Route Sig Dispense Refill  . AMLODIPINE BESYLATE 10 MG PO TABS Oral Take 1 tablet (10 mg total) by mouth daily. 30 tablet 1  . GLYBURIDE 5 MG PO TABS Oral Take 5 mg by mouth daily with breakfast.      . LISINOPRIL 10 MG PO TABS Oral Take 10 mg by mouth daily.      Marland Kitchen METFORMIN HCL 1000 MG PO TABS Oral Take 500 mg by mouth 2 (two) times daily with a meal.       There were no vitals taken for this visit.  Physical Exam    Nursing note and vitals reviewed. Constitutional: She is oriented to person, place, and time. No distress.       Laying in bed. Tired appearing. Obese.  HENT:  Head: Normocephalic and atraumatic.  Right Ear: External ear normal.  Left Ear: External ear normal.  Mouth/Throat: Oropharynx is clear and moist. No oropharyngeal exudate.  Eyes: Conjunctivae and EOM are normal. Pupils are equal, round, and reactive to light. Right eye exhibits no discharge. Left eye exhibits no discharge. No scleral icterus.  Neck: Normal range of motion. Neck supple.  Cardiovascular: Normal rate, regular rhythm and normal heart sounds.  Exam reveals no gallop and no friction rub.   No murmur heard. Pulmonary/Chest: Effort normal and breath sounds normal. No stridor. No respiratory distress.  Abdominal: Soft. She exhibits no distension. There is tenderness.       Mild tenderness in epigastrium. No rebound or guarding. No distention.  Genitourinary:       No CVA tenderness  Musculoskeletal: She exhibits no edema and no tenderness.  Lymphadenopathy:    She has no cervical adenopathy.  Neurological: She is alert and oriented to person, place, and time. No cranial nerve deficit. She exhibits normal muscle tone. Coordination normal.       Speech clear and content appropriate. AOx3. Good finger  to nose b/l.  Skin: Skin is warm and dry. She is not diaphoretic.  Psychiatric: She has a normal mood and affect. Her behavior is normal. Thought content normal.    ED Course  LUMBAR PUNCTURE Date/Time: 05/28/2012 12:00 AM Performed by: Virgel Manifold Authorized by: Virgel Manifold Consent: Verbal consent obtained. Risks and benefits: risks, benefits and alternatives were discussed Consent given by: patient Required items: required blood products, implants, devices, and special equipment available Patient identity confirmed: verbally with patient and arm band Time out: Immediately prior to procedure a "time out" was  called to verify the correct patient, procedure, equipment, support staff and site/side marked as required. Indications: evaluation for infection Anesthesia: local infiltration Local anesthetic: lidocaine 1% without epinephrine Anesthetic total: 3 ml Patient sedated: no Preparation: Patient was prepped and draped in the usual sterile fashion. Lumbar space: L4-L5 interspace Patient's position: sitting Needle gauge: 22 Needle type: spinal needle - Quincke tip Needle length: 3.5 in Number of attempts: 1 Fluid appearance: blood-tinged then clearing Tubes of fluid: 4 Total volume: 4 ml Post-procedure: site cleaned Patient tolerance: Patient tolerated the procedure well with no immediate complications. Comments: Pt with vagal response during procedure. Brady down to 30s. Talking through out.   (including critical care time)  Labs Reviewed  GLUCOSE, CAPILLARY - Abnormal; Notable for the following:    Glucose-Capillary 299 (*)    All other components within normal limits  CBC - Abnormal; Notable for the following:    WBC 13.2 (*)    All other components within normal limits  URINALYSIS, ROUTINE W REFLEX MICROSCOPIC - Abnormal; Notable for the following:    Glucose, UA >1000 (*)    Hgb urine dipstick TRACE (*)    Protein, ur 100 (*)    All other components within normal limits  COMPREHENSIVE METABOLIC PANEL - Abnormal; Notable for the following:    Sodium 131 (*)    Chloride 92 (*)    Glucose, Bld 305 (*)    GFR calc non Af Amer 88 (*)    All other components within normal limits  GLUCOSE, CSF - Abnormal; Notable for the following:    Glucose, CSF 157 (*)    All other components within normal limits  PROTEIN, CSF - Abnormal; Notable for the following:    Total  Protein, CSF 139 (*)    All other components within normal limits  LACTIC ACID, PLASMA  URINE MICROSCOPIC-ADD ON  CULTURE, BLOOD (ROUTINE X 2)  CULTURE, BLOOD (ROUTINE X 2)  CSF CELL COUNT WITH DIFFERENTIAL  CSF  CULTURE  GRAM STAIN  VDRL, CSF   Dg Chest Portable 1 View  05/27/2012  *RADIOLOGY REPORT*  Clinical Data: Fever.  PORTABLE CHEST - 1 VIEW  Comparison: 11/07/2010  Findings: Heart size and pulmonary vascularity are normal considering the supine technique.  No infiltrates or effusions.  No acute osseous abnormality.  IMPRESSION: No acute disease.  Original Report Authenticated By: Larey Seat, M.D.   EKG:  Rhythm: Sinus  Rate: 78 Axis: Normal Intervals: Right atrial enlargement ST segments: Slight ST depression laterally and inferiorly Comparison: T-wave changes are more pronounced than prior EKG from 12/08/2011  1. Fever   2. Mental status, decreased       MDM  61 year old female with decreased mental status. Likely secondary to febrile illness. Source of fever is unclear based on history, exam and w/u. Suspect viral illness. No signs of meningismus on exam. Tired but not altered. Has been in ED for 5 hours  with improvement of mental status and symptoms. No new complaints on repeat exam just prior to DC. LP bloody tap which cleared. Interpretation of results, particularly protein and glucose unreliable. No organisms on gram stain and no WBC on cell count. Feel pt safe for discharge at this time. Return precautions discussed with pt and family.  Virgel Manifold, MD 05/28/12 [phone]

## 2012-05-27 NOTE — ED Notes (Signed)
Lumbar tray set up.  EDP [place] notified.

## 2012-05-27 NOTE — ED Notes (Signed)
Per family:  Pt laid down around 10am b/c her sinuses have been bothering her.  Later family notice she had fallen on the floor, was lethargic.  Pt slept for a little while longer and then started becoming incontinent.  Pt was very weak and unable to stand on her own.  Pt started mumbling and unable to complete sentences.  Pt has also been vomiting.

## 2012-05-28 LAB — CSF CELL COUNT WITH DIFFERENTIAL
Eosinophils, CSF: 0 % (ref 0–1)
Lymphs, CSF: 0 % — ABNORMAL LOW (ref 40–80)
Monocyte-Macrophage-Spinal Fluid: 0 % — ABNORMAL LOW (ref 15–45)
Other Cells, CSF: 0
RBC Count, CSF: 130 /mm3 — ABNORMAL HIGH
Segmented Neutrophils-CSF: 0 % (ref 0–6)
Tube #: 3

## 2012-05-28 LAB — PROTEIN, CSF: Total  Protein, CSF: 139 mg/dL — ABNORMAL HIGH (ref 15–45)

## 2012-05-28 LAB — GRAM STAIN
Gram Stain: NONE SEEN
Special Requests: NORMAL

## 2012-05-28 LAB — GLUCOSE, CSF: Glucose, CSF: 157 mg/dL — ABNORMAL HIGH (ref 43–76)

## 2012-05-28 MED ORDER — IBUPROFEN 200 MG PO TABS
400.0000 mg | ORAL_TABLET | Freq: Once | ORAL | Status: AC
  Administered 2012-05-28: 400 mg via ORAL
  Filled 2012-05-28: qty 2

## 2012-05-28 NOTE — Discharge Instructions (Signed)
Fatigue Fatigue is a feeling of tiredness, lack of energy, lack of motivation, or feeling tired all the time. Having enough rest, good nutrition, and reducing stress will normally reduce fatigue. Consult your caregiver if it persists. The nature of your fatigue will help your caregiver to find out its cause. The treatment is based on the cause.  CAUSES  There are many causes for fatigue. Most of the time, fatigue can be traced to one or more of your habits or routines. Most causes fit into one or more of three general areas. They are: Lifestyle problems  Sleep disturbances.   Overwork.   Physical exertion.   Unhealthy habits.   Poor eating habits or eating disorders.   Alcohol and/or drug use .   Lack of proper nutrition (malnutrition).  Psychological problems  Stress and/or anxiety problems.   Depression.   Grief.   Boredom.  Medical Problems or Conditions  Anemia.   Pregnancy.   Thyroid gland problems.   Recovery from major surgery.   Continuous pain.   Emphysema or asthma that is not well controlled   Allergic conditions.   Diabetes.   Infections (such as mononucleosis).   Obesity.   Sleep disorders, such as sleep apnea.   Heart failure or other heart-related problems.   Cancer.   Kidney disease.   Liver disease.   Effects of certain medicines such as antihistamines, cough and cold remedies, prescription pain medicines, heart and blood pressure medicines, drugs used for treatment of cancer, and some antidepressants.  SYMPTOMS  The symptoms of fatigue include:   Lack of energy.   Lack of drive (motivation).   Drowsiness.   Feeling of indifference to the surroundings.  DIAGNOSIS  The details of how you feel help guide your caregiver in finding out what is causing the fatigue. You will be asked about your present and past health condition. It is important to review all medicines that you take, including prescription and non-prescription items. A  thorough exam will be done. You will be questioned about your feelings, habits, and normal lifestyle. Your caregiver may suggest blood tests, urine tests, or other tests to look for common medical causes of fatigue.  TREATMENT  Fatigue is treated by correcting the underlying cause. For example, if you have continuous pain or depression, treating these causes will improve how you feel. Similarly, adjusting the dose of certain medicines will help in reducing fatigue.  HOME CARE INSTRUCTIONS   Try to get the required amount of good sleep every night.   Eat a healthy and nutritious diet, and drink enough water throughout the day.   Practice ways of relaxing (including yoga or meditation).   Exercise regularly.   Make plans to change situations that cause stress. Act on those plans so that stresses decrease over time. Keep your work and personal routine reasonable.   Avoid street drugs and minimize use of alcohol.   Start taking a daily multivitamin after consulting your caregiver.  SEEK MEDICAL CARE IF:   You have persistent tiredness, which cannot be accounted for.   You have fever.   You have unintentional weight loss.   You have headaches.   You have disturbed sleep throughout the night.   You are feeling sad.   You have constipation.   You have dry skin.   You have gained weight.   You are taking any new or different medicines that you suspect are causing fatigue.   You are unable to sleep at night.     You develop any unusual swelling of your legs or other parts of your body.  SEEK IMMEDIATE MEDICAL CARE IF:   You are feeling confused.   Your vision is blurred.   You feel faint or pass out.   You develop severe headache.   You develop severe abdominal, pelvic, or back pain.   You develop chest pain, shortness of breath, or an irregular or fast heartbeat.   You are unable to pass a normal amount of urine.   You develop abnormal bleeding such as bleeding from  the rectum or you vomit blood.   You have thoughts about harming yourself or committing suicide.   You are worried that you might harm someone else.  MAKE SURE YOU:   Understand these instructions.   Will watch your condition.   Will get help right away if you are not doing well or get worse.  Document Released: 10/01/2007 Document Revised: 11/23/2011 Document Reviewed: 10/01/2007 [facility] Patient Information 2012 [place].Fever, Adult A fever is a temperature of 100.4 F (38 C) or above.  HOME CARE  Take fever medicine as told by your doctor. Do not  take aspirin for fever if you are younger than 62 years of age.   If you are given antibiotic medicine, take it as told. Finish the medicine even if you start to feel better.   Rest.   Drink enough fluids to keep your pee (urine) clear or pale yellow. Do not drink alcohol.   Take a bath or shower with room temperature water. Do not use ice water or alcohol sponge baths.   Wear lightweight, loose clothes.  GET HELP RIGHT AWAY IF:   You are short of breath or have trouble breathing.   You are very weak.   You are dizzy or you pass out (faint).   You are very thirsty or are making little or no urine.   You have new pain.   You throw up (vomit) or have watery poop (diarrhea).   You keep throwing up or having watery poop for more than 1 to 2 days.   You have a stiff neck or light bothers your eyes.   You have a skin rash.   You have a fever or problems (symptoms) that last for more than 2 to 3 days.   You have a fever and your problems quickly get worse.   You keep throwing up the fluids you drink.   You do not feel better after 3 days.   You have new problems.  MAKE SURE YOU:   Understand these instructions.   Will watch your condition.   Will get help right away if you are not doing well or get worse.  Document Released: 09/12/2008 Document Revised: 11/23/2011 Document Reviewed: 10/05/2011 [facility]  Patient Information 2012 [place].  RESOURCE GUIDE  Chronic Pain Problems: Contact [facility]  [phone] Patients need to be referred by their primary care doctor.  Insufficient Money for Medicine: Contact United Way:  call "211" or [place] [phone].  No Primary Care Doctor: - Call [facility]  [phone] - can help you locate a primary care doctor that  accepts your insurance, provides certain services, etc. - Physician Referral Service- [phone]  Agencies that provide inexpensive medical care: - [facility]  [id number] - [facility]  [phone] - [facility]  [phone] - [facility]  [phone] - Planned Parenthood  [facility]  [id number]  [place] Providers: - [facility]- [address]  [phone], Mon-Fri 9am-7pm, Sat 9am-1pm - [place], [place]  [place], [address] [place]  [phone] [facility]- [address]  [place], [address], [phone]  Only accepts [place] patients after they have their name  applied to their card  Self Pay (no insurance) in [place]: - Sickle Cell Patients: Dr Kevan Ny, [facility]  [place], [facility]- [place]  [facility] [place]- [id number] [address], [facility]- see information above (Speak to [employer] if you do not have insurance)       -  [facility]- [place], [place],  [place], [place]  Dr Vista Lawman-  [address], [place],  [facility]- [address] [phone]       -  [facility]- [address], [place], also [address] [id number]       -    [facility]- [address], [place], 1st & 3rd Saturday   every month, 10am-1pm  1) Find a Doctor and Pay Out of Pocket Although you won't have to find out who is covered by your insurance plan, it is a good idea to ask around and get recommendations. You will then need to call the office and see if the doctor you have chosen will accept you as a new patient and what types of options they offer for patients who are self-pay. Some doctors offer discounts or will set up payment plans for their patients who do not have insurance, but you will need to ask so you aren't surprised when you get to your appointment.  2) Contact Your Local Health Department Not all health departments have doctors that can see patients for sick visits, but many do, so it is worth a call to see if yours does. If you don't know where your local health department is, you can check in your phone book. The CDC also has a tool to help you locate your state's health department, and many state websites also have listings of all of their local health departments.  3) Find a [facility] If your illness is not likely to be very severe or complicated, you may want to try a walk in clinic. These are popping up all over the country in pharmacies, drugstores, and shopping centers. They're usually staffed by nurse practitioners or physician assistants that have been trained to treat common illnesses and complaints. They're usually fairly quick and inexpensive. However, if you have serious medical issues or chronic medical problems, these are probably not your best option  STD Testing - [place], [facility], [address] [place], phone [phone] or [phone].  Monday - Friday, call  for  an appointment. - [place], [facility] Clinic, [address], [place], phone [phone] or [phone].  Monday - Friday, call for an appointment.  Abuse/Neglect: - [place] [phone] - [place] [phone] (After Hours)  Emergency Shelter:  [facility] [phone]  Maternity Homes: - [facility] [place] [phone] - [place] [phone]  MRSA Hotline #:   618-216-2069  [facility] Dept. [address]         [place] Phone:  [phone]                                  Phone:  [phone]                   Phone:  [phone]  [place], [place] in [place], [address],                                  [place] [phone] or [phone] (After Hours)   [place]  Substance Abuse Resources: - Alcohol and Drug Services  [phone] - [place] [phone] - [facility] [place] [phone] - Residential & Outpatient Substance Abuse Program  [phone]  Psychological Services: - [place]  [place]  [place], [phone] [place]. [address] [place], [place]: [phone] or [phone], [employer]  Dental Assistance  If unable to pay or uninsured, contact:  [facility] or [facility]. to become qualified  for the adult dental clinic.  Patients with Medicaid: [facility] [phone], [place] [address] [phone]  If unable to pay, or uninsured, contact [facility] [phone]) or [place] [phone] in [place], [place] in [facility]) to become qualified for the adult dental clinic  Other [place]- [place], [place], [place], [address], [place], [place], 2nd and 4th Thursday of the month at 6:30am.  10 clients each day by appointment, can sometimes see walk-in patients if someone does not show for an appointment. [facility]- [address] [place], [place], [address], [place], [place], [place], [address], [place] Department- [place] Department-  [phone]     

## 2012-05-29 LAB — VDRL, CSF: VDRL Quant, CSF: NONREACTIVE

## 2012-05-31 LAB — CSF CULTURE: Gram Stain: NONE SEEN

## 2012-05-31 LAB — CSF CULTURE W GRAM STAIN
Culture: NO GROWTH
Special Requests: NORMAL

## 2012-06-03 LAB — CULTURE, BLOOD (ROUTINE X 2)
Culture  Setup Time: 201306110100
Culture  Setup Time: 201306110100
Culture: NO GROWTH
Culture: NO GROWTH

## 2012-10-15 ENCOUNTER — Ambulatory Visit (INDEPENDENT_AMBULATORY_CARE_PROVIDER_SITE_OTHER): Payer: Self-pay | Admitting: Family Medicine

## 2012-10-15 ENCOUNTER — Encounter: Payer: Self-pay | Admitting: Family Medicine

## 2012-10-15 VITALS — BP 158/86 | HR 75 | Temp 98.1°F | Ht 66.0 in | Wt 231.0 lb

## 2012-10-15 DIAGNOSIS — Z0000 Encounter for general adult medical examination without abnormal findings: Secondary | ICD-10-CM

## 2012-10-15 DIAGNOSIS — E119 Type 2 diabetes mellitus without complications: Secondary | ICD-10-CM

## 2012-10-15 DIAGNOSIS — G5620 Lesion of ulnar nerve, unspecified upper limb: Secondary | ICD-10-CM

## 2012-10-15 DIAGNOSIS — Z1211 Encounter for screening for malignant neoplasm of colon: Secondary | ICD-10-CM

## 2012-10-15 DIAGNOSIS — G5621 Lesion of ulnar nerve, right upper limb: Secondary | ICD-10-CM

## 2012-10-15 DIAGNOSIS — E785 Hyperlipidemia, unspecified: Secondary | ICD-10-CM

## 2012-10-15 DIAGNOSIS — I10 Essential (primary) hypertension: Secondary | ICD-10-CM

## 2012-10-15 LAB — POCT GLYCOSYLATED HEMOGLOBIN (HGB A1C): Hemoglobin A1C: 8

## 2012-10-15 MED ORDER — AMLODIPINE BESYLATE 5 MG PO TABS: 5.0000 mg | ORAL_TABLET | Freq: Every day | ORAL | Status: DC

## 2012-10-15 NOTE — Patient Instructions (Addendum)
Veronica Simmons,  Thank you for coming in to see me today.  It was a pleasure meeting you.   Regarding blood pressure:  Stop atenololo-chlorathalidone.  Start norvasc 5 mg daily sent to [facility] on [address].   Call this number regarding, the breast cancer screening.  [name], [place] [address] [phone]  Collect stool on stool cards and send back to [place].   [name] will call you regarding insurance (orange card) program.   Please see me in 4 week for pap smear and HTN f/u.   Dr. Adrian Blackwater

## 2012-10-17 ENCOUNTER — Other Ambulatory Visit: Payer: Self-pay | Admitting: Family Medicine

## 2012-10-17 NOTE — Progress Notes (Signed)
Subjective:     Patient ID: Veronica Simmons, female   DOB: 25-Dec-1950, 61 y.o.   MRN: [id number]  HPI 61 yo F new patient presents to establish primary care and to discuss the following: She [facility] patient and brings in a packet of her medical records.   1. DM 2: diabetic x 13 years. Never been on insulin. No CP or SOB. Positive for excessive thirst. Negative urinary frequency. Not checking CBGs lately. Out of strips.   2. HTN: x 13 yrs. Denies CP, SOB. Admits to joint swelling.   3. Pain R hand with contractures.   Health maintenance: due to pap, mammogram, colon CA screening (does yearly stool cards), flu shot, zostavax. Declined flu shot.   Review of Systems Patient Information Form: Screening and ROS  AUDIT-C Score: 0 Do you feel safe in relationships? yes PHQ-2: positive to question 2.   Review of Symptoms  General: Positive for fever.  Negative for unexplained weight loss,  Skin: Negative for new or changing mole, sore that won't heal HEENT: Negative for trouble hearing, trouble seeing, ringing in ears, mouth sores, hoarseness, change in voice, dysphagia. CV:  Negative for chest pain, dyspnea, edema, palpitations Resp: Negative for cough, dyspnea, hemoptysis GI: Negative for nausea, vomiting, diarrhea, constipation, abdominal pain, melena, hematochezia. GU: Negative for dysuria, incontinence, urinary hesitance, hematuria, vaginal or penile discharge, polyuria, sexual difficulty, lumps in testicle or breasts MSK: Positive for muscle cramps or aches, joint pain or swelling Neuro:  Positive for weakness and numbness. Negative for headaches, dizziness, passing out/fainting Psych: Negative for depression, anxiety, memory problems Endo: Positive for excessive thirst. Negative for frequent urination.      Objective:   Physical Exam BP 158/86  Pulse 75  Temp 98.1 F (36.7 C) (Oral)  Ht 5\' 6"  (1.676 m)  Wt 231 lb (104.781 kg)  BMI 37.28 kg/m2 General  appearance: alert, cooperative and no distress Head: Normocephalic, without obvious abnormality, atraumatic Eyes: conjunctivae/corneas clear. PERRL, EOM's intact.  Throat: lips, mucosa, and tongue normal; teeth and gums normal Neck: no adenopathy, no carotid bruit, no JVD, supple, symmetrical, trachea midline and thyroid not enlarged, symmetric, no tenderness/mass/nodules Lungs: clear to auscultation bilaterally Heart: regular rate and rhythm, S1, S2 normal, no murmur, click, rub or gallop Abdomen: soft, non-tender; bowel sounds normal; no masses,  no organomegaly Extremities: R hand 4th and 5th digit contracture. Able to extend manually. Non tender. L hand DIP swelling, non tender, no inflammation.  Neurologic: Grossly normal    Assessment and Plan:

## 2012-10-31 DIAGNOSIS — Z0000 Encounter for general adult medical examination without abnormal findings: Secondary | ICD-10-CM | POA: Insufficient documentation

## 2012-10-31 DIAGNOSIS — G5621 Lesion of ulnar nerve, right upper limb: Secondary | ICD-10-CM

## 2012-10-31 HISTORY — DX: Lesion of ulnar nerve, right upper limb: G56.21

## 2012-10-31 NOTE — Assessment & Plan Note (Signed)
Call this number regarding, the breast cancer screening.  [place], [place] [address] [phone]  Collect stool on stool cards and send back to [place].   [name] will call you regarding insurance (orange card) program.   Please see me in 4 week for pap smear and HTN f/u.

## 2012-10-31 NOTE — Assessment & Plan Note (Addendum)
A: Improved 8 from 10.7 back in July.  P: Continue oral therapy.

## 2012-10-31 NOTE — Assessment & Plan Note (Addendum)
A: Elevated above goal. Looking back at previous office notes from [facility] she runs high 150s-160s/90s.  P:  Regarding blood pressure:  Stop atenololo-chlorathalidone.  Start norvasc 5 mg daily sent to [facility] on [address].

## 2012-11-11 ENCOUNTER — Ambulatory Visit: Payer: Self-pay | Admitting: Family Medicine

## 2012-11-13 LAB — HEMOCCULT GUIAC POC 1CARD (OFFICE): Card #3 Fecal Occult Blood, POC: NEGATIVE

## 2012-11-13 NOTE — Addendum Note (Signed)
Addended by: [place], Detrick Dani on: 11/13/2012 05:20 PM   Modules accepted: Orders

## 2012-11-18 ENCOUNTER — Encounter: Payer: Self-pay | Admitting: *Deleted

## 2012-11-22 ENCOUNTER — Other Ambulatory Visit: Payer: Self-pay | Admitting: Family Medicine

## 2012-11-22 NOTE — Telephone Encounter (Signed)
Called patient to clarify metformin dose. Asked her to call back with details.

## 2012-11-27 ENCOUNTER — Ambulatory Visit (INDEPENDENT_AMBULATORY_CARE_PROVIDER_SITE_OTHER): Payer: Self-pay | Admitting: Family Medicine

## 2012-11-27 ENCOUNTER — Encounter: Payer: Self-pay | Admitting: Family Medicine

## 2012-11-27 VITALS — BP 142/78 | HR 67 | Temp 98.0°F | Ht 66.0 in | Wt 227.0 lb

## 2012-11-27 DIAGNOSIS — R232 Flushing: Secondary | ICD-10-CM | POA: Insufficient documentation

## 2012-11-27 DIAGNOSIS — I10 Essential (primary) hypertension: Secondary | ICD-10-CM

## 2012-11-27 MED ORDER — FLUOXETINE HCL 20 MG PO TABS: 20.0000 mg | ORAL_TABLET | Freq: Every day | ORAL | Status: DC

## 2012-11-27 MED ORDER — BENAZEPRIL HCL 40 MG PO TABS: 40.0000 mg | ORAL_TABLET | Freq: Every day | ORAL | Status: DC

## 2012-11-27 NOTE — Assessment & Plan Note (Signed)
Start celexa. 

## 2012-11-27 NOTE — Assessment & Plan Note (Signed)
A: elevated but near goal. Tolerating norvasc.  P:  lotensin and norvasc.  Taper  atenolol-chlorthalidone. 1/2 tab daily x 3 days then 1/2 tab every other day x 3 days. Then stop.

## 2012-11-27 NOTE — Progress Notes (Signed)
Subjective:     Patient ID: Veronica Simmons, female   DOB: 01/04/1951, 61 y.o.   MRN: [id number]  HPI 61 yo F presents for f/u to discuss the following:  1. Would like to go back to work. Works for [employer]. Has contractures in hands. Has a release of medical information for that she needed for information for.   2. Weight loss: has a pill that she researched that she would like to try. Does not recall the name of the pill. Will call with information. [email or web address] and [email or web address]  3. HTN: started norvasc 4 days ago. Still taking antenolol-chlothalidone. Not taking lotensin. denies HA, vision changes, CP, SOB and LE edema.   4. Hot flashes: occur multiple times per day. Worse at night.   Review of Systems As per HPI    Objective:   Physical Exam BP 142/78  Pulse 67  Temp 98 F (36.7 C) (Oral)  Ht 5\' 6"  (1.676 m)  Wt 227 lb (102.967 kg)  BMI 36.64 kg/m2 General appearance: alert, cooperative and no distress Lungs: clear to auscultation bilaterally Heart: regular rate and rhythm, S1, S2 normal, no murmur, click, rub or gallop Extremities: extremities normal, atraumatic, no cyanosis or edema    Assessment and Plan:

## 2012-11-27 NOTE — Patient Instructions (Addendum)
Veronica Simmons,  Thank you very much for coming in to see me today.   I will look out for medical record request from [facility]. I agree that getting back to work is a good idea. I will send you to PT once you have insurance.   Please call with the name of with weight loss medication. In addition, here is a diet I would like you to follow.  For your diet:  1. Make sure to eat breakfast, lunch and dinner (also may add one snack mid morning or mid afternoon).  2. Carbs: no more than 2 servings (30 gram/2oz) per meal and 1 serving per snack.  3. Exercise such that you sweat some and your heart rate goes up most days of the weak.  4. Water, water, water  5. Check blood sugar 2-3 x per week to get a feel for how different foods effect your blood sugar:  Goal fasting <100  Goal after eating < 200  For hot flashes: start prozac (fluoextine) 20 mg daily.   For HTN: restart lotensin. Continue norvasc.  Taper  atenolol-chlorthalidone. 1/2 tab daily x 3 days then 1/2 tab every other day x 3 days. Then stop.   See me in 3 weeks.   Dr. Adrian Blackwater

## 2012-12-20 ENCOUNTER — Other Ambulatory Visit: Payer: Self-pay | Admitting: Family Medicine

## 2012-12-20 MED ORDER — METFORMIN HCL 500 MG PO TABS: 500.0000 mg | ORAL_TABLET | Freq: Every day | ORAL | Status: DC

## 2012-12-20 MED ORDER — METFORMIN HCL 1000 MG PO TABS: 1000.0000 mg | ORAL_TABLET | Freq: Every day | ORAL | Status: DC

## 2012-12-20 NOTE — Telephone Encounter (Signed)
Refill sent in. Please inform patient.

## 2012-12-20 NOTE — Telephone Encounter (Signed)
Pt informed. Veronica Simmons  

## 2012-12-20 NOTE — Telephone Encounter (Signed)
Pt states that she has been taking Metformin 1000 AM & 500 PM - she is out of the 500mg  and wants to know if Dr Adrian Blackwater can call some in for her.  Her sugar has been running high  [facility] [address]

## 2013-01-01 ENCOUNTER — Ambulatory Visit (INDEPENDENT_AMBULATORY_CARE_PROVIDER_SITE_OTHER): Payer: Self-pay | Admitting: Family Medicine

## 2013-01-01 ENCOUNTER — Encounter: Payer: Self-pay | Admitting: Family Medicine

## 2013-01-01 VITALS — BP 160/78 | HR 89 | Temp 98.2°F | Ht 66.0 in | Wt 231.0 lb

## 2013-01-01 DIAGNOSIS — I10 Essential (primary) hypertension: Secondary | ICD-10-CM

## 2013-01-01 NOTE — Progress Notes (Signed)
Subjective:     Patient ID: Nile Dear, female   DOB: [birth year], 62 y.o.   MRN: [id number]  HPI 62 yo F presents for f/u visit for:  1. HTN: compliant with medications. Denies HA, CP, SOB and LE edema.   2. Obesity: exercise twice daily. Asked me about the web sites she has questions about. I have not had time to review web sites.   Review of Systems As per HPI    Objective:   Physical Exam BP 164/72  Pulse 89  Temp 98.2 F (36.8 C) (Oral)  Ht 5\' 6"  (1.676 m)  Wt 231 lb (104.781 kg)  BMI 37.28 kg/m2 General appearance: alert, cooperative and no distress Lungs: clear to auscultation bilaterally Heart: regular rate and rhythm, S1, S2 normal, no murmur, click, rub or gallop Extremities: extremities normal, atraumatic, no cyanosis or edema    Assessment and Plan:

## 2013-01-01 NOTE — Assessment & Plan Note (Signed)
Continue current medications. Follow in 3 months. Follow up for BP sooner if it is > 150/90.

## 2013-01-01 NOTE — Patient Instructions (Addendum)
Veronica Simmons,  Thank you for coming in today.  Continue current medications. Follow in 3 months. Follow up for BP sooner if it is > 150/90.   Dr. Adrian Blackwater

## 2013-01-24 ENCOUNTER — Telehealth: Payer: Self-pay | Admitting: Family Medicine

## 2013-01-24 NOTE — Telephone Encounter (Signed)
Will forward to Dr. Funches 

## 2013-01-24 NOTE — Telephone Encounter (Signed)
Pt is asking about her getting diet pills - was told that Dr Adrian Blackwater was going to research it and call something in for her.  She is giving a reminder call...   [place]

## 2013-01-31 NOTE — Telephone Encounter (Signed)
Called patient. Left VM.  I checked out both of the web sites she asked me to research. If she is interested in trying Alli she can do so. I did warn of her greasy/fat stools, anal leakage and to be mindful for possible fluctuations in blood sugar. I recommend trying the medication for now more than 3 months. The med is OTC, no Rx needed.

## 2013-02-04 ENCOUNTER — Ambulatory Visit: Payer: Self-pay

## 2013-02-07 ENCOUNTER — Telehealth: Payer: Self-pay | Admitting: Family Medicine

## 2013-02-07 NOTE — Telephone Encounter (Signed)
Patient states that pain in her feet keeps her up at night. Would like to speak to a nurse or Funches about possibly getting a rx for pain in feet.

## 2013-02-07 NOTE — Telephone Encounter (Signed)
Pt states that her feet have been tingling and trobbing for a while now.  Asked what her sugars have been running, and she say they "are probably high".  Advised that she come in for appt to check on her Diabetes since it has been since October and that this could have to do with her Diabetes.  Pt is agreeable and appt made for 3.3.14. [place], Salome Spotted

## 2013-02-17 ENCOUNTER — Encounter: Payer: Self-pay | Admitting: Family Medicine

## 2013-02-17 ENCOUNTER — Ambulatory Visit (INDEPENDENT_AMBULATORY_CARE_PROVIDER_SITE_OTHER): Payer: Self-pay | Admitting: Family Medicine

## 2013-02-17 VITALS — BP 180/90 | HR 78 | Temp 97.5°F | Ht 66.0 in | Wt 228.0 lb

## 2013-02-17 DIAGNOSIS — E119 Type 2 diabetes mellitus without complications: Secondary | ICD-10-CM

## 2013-02-17 DIAGNOSIS — I10 Essential (primary) hypertension: Secondary | ICD-10-CM

## 2013-02-17 LAB — POCT GLYCOSYLATED HEMOGLOBIN (HGB A1C): Hemoglobin A1C: 7.2

## 2013-02-17 MED ORDER — BENAZEPRIL HCL 40 MG PO TABS: 40.0000 mg | ORAL_TABLET | Freq: Every day | ORAL | Status: DC

## 2013-02-17 MED ORDER — GLIPIZIDE ER 10 MG PO TB24: 10.0000 mg | ORAL_TABLET | Freq: Every day | ORAL | Status: DC

## 2013-02-17 MED ORDER — AMLODIPINE BESYLATE 10 MG PO TABS: 10.0000 mg | ORAL_TABLET | Freq: Every day | ORAL | Status: DC

## 2013-02-17 MED ORDER — METFORMIN HCL 1000 MG PO TABS: 1000.0000 mg | ORAL_TABLET | Freq: Two times a day (BID) | ORAL | Status: DC

## 2013-02-17 MED ORDER — GABAPENTIN 300 MG PO CAPS: 300.0000 mg | ORAL_CAPSULE | Freq: Every day | ORAL | Status: DC

## 2013-02-17 NOTE — Progress Notes (Signed)
Subjective:     Patient ID: Veronica Simmons, female   DOB: 24-Apr-1951, 62 y.o.   MRN: [id number]  HPI 62 yo F presents for f/u to discuss the following:  1. DM2: compliant with metformin and glipizide. Does not check CBGS at home. Denies CP, SOB, GI upset. Admits to tingling in feet and hands with numbness at night.   2. HTN: missed meds doses. No medication today. Woke up with headache this AM. No vision changes. No swelling. Not compliant with low salt diet. Working on weight loss.   Review of Systems As per HPI     Objective:   Physical Exam BP 180/90  Pulse 78  Temp(Src) 97.5 F (36.4 C) (Oral)  Ht 5\' 6"  (1.676 m)  Wt 228 lb (103.42 kg)  BMI 36.82 kg/m2 Wt Readings from Last 3 Encounters:  02/17/13 228 lb (103.42 kg)  01/01/13 231 lb (104.781 kg)  11/27/12 227 lb (102.967 kg)   General appearance: alert, cooperative and no distress Lungs: clear to auscultation bilaterally Heart: regular rate and rhythm, S1, S2 normal, no murmur, click, rub or gallop Extremities: extremities normal, atraumatic, no cyanosis or edema    Assessment and Plan:

## 2013-02-17 NOTE — Assessment & Plan Note (Signed)
A: improved.  P: Continue metformin and glipizide Start gabapentin for neuropathy

## 2013-02-17 NOTE — Patient Instructions (Addendum)
Ms. Coder,  Thank you for coming in today.  Awesome job with diabetes: Continue glipizide 10 daily Metformin 1000 mg twice daily  Start gabapentin 300 mg nightly for tingling/numbness in feet.  Continue weight loss.   For HTN: BP elevated today. Continue lotensin 40 mg daily  Increased norvasc from 5 to 10 mg daily.   Please f/u in one month.  Dr. Adrian Blackwater

## 2013-02-17 NOTE — Assessment & Plan Note (Signed)
A: declined. BP elevated. P: Increase norvasc to 10 mg daily  Continue lotensin at current dose

## 2013-03-18 ENCOUNTER — Ambulatory Visit: Payer: Self-pay | Admitting: Family Medicine

## 2013-03-28 ENCOUNTER — Encounter: Payer: Self-pay | Admitting: Family Medicine

## 2013-03-28 ENCOUNTER — Ambulatory Visit (INDEPENDENT_AMBULATORY_CARE_PROVIDER_SITE_OTHER): Payer: Self-pay | Admitting: Family Medicine

## 2013-03-28 VITALS — BP 152/90 | HR 88 | Ht 65.5 in | Wt 224.0 lb

## 2013-03-28 DIAGNOSIS — I10 Essential (primary) hypertension: Secondary | ICD-10-CM

## 2013-03-28 DIAGNOSIS — E119 Type 2 diabetes mellitus without complications: Secondary | ICD-10-CM

## 2013-03-28 LAB — BASIC METABOLIC PANEL
BUN: 12 mg/dL (ref 6–23)
Potassium: 4.1 mEq/L (ref 3.5–5.3)
Sodium: 139 mEq/L (ref 135–145)

## 2013-03-28 MED ORDER — GABAPENTIN 300 MG PO CAPS: 600.0000 mg | ORAL_CAPSULE | Freq: Three times a day (TID) | ORAL | Status: DC

## 2013-03-28 NOTE — Patient Instructions (Addendum)
Veronica Simmons,  Thank you for coming in today. Your blood pressure has greatly improved.  For continued BP management: Increase exercise to 30-45 mins 5 days weekly. Checking labs today.  For neuropathy:  Increase gabapentin, start by taking 600 mg nightly. If you tolerate this for 3-4 days. You can start 300 mg during the day. Work up every 3-4 days to goal of 600 mg three times a day if tolerated. Cut back if you develop fatigue/dizziness.   Dr. Adrian Blackwater

## 2013-03-28 NOTE — Assessment & Plan Note (Signed)
A: improved. Still above goal of 140/90. P: Continue current regimen. Would like to start HCTZ, patient decline. Lifestyle modifications. Check BMP.

## 2013-03-28 NOTE — Progress Notes (Signed)
Subjective:     Patient ID: Veronica Simmons, female   DOB: 12/23/1950, 62 y.o.   MRN: [id number]  HPI 62 year old female presents for follow visit to discuss the following  1 hypertension: Patient compliant with medications. She exercises 3-4 times a week for about 30 minutes. She denies headache chest pain shortness of breath. She is not interested in starting additional medication for hypertension.  2. bilateral lower extremity pain: Pain is throughout the day and night. Pain is described as cramping tingling and throbbing. Patient compliant with gabapentin nightly. She denies associated weakness.  3. Disability: Patient is a platform denied disability twice. 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 She plans to reapply for disability. She's also interested in physical therapy for her hand.  Review of Systems As per HPI     Objective:   Physical Exam BP 152/90  Pulse 88  Ht 5' 5.5" (1.664 m)  Wt 224 lb (101.606 kg)  BMI 36.7 kg/m2 General appearance: alert, cooperative and no distress Lungs: clear to auscultation bilaterally Heart: normal apical impulse Extremities:  RUE: flexion and atrophy of 3rd-5th digit on R hand.  LE: extremities normal, atraumatic, no cyanosis or edema     Assessment and Plan:

## 2013-03-28 NOTE — Assessment & Plan Note (Signed)
A: neuropathy P: taper up gabapentin

## 2013-04-01 ENCOUNTER — Telehealth: Payer: Self-pay | Admitting: Family Medicine

## 2013-04-01 NOTE — Telephone Encounter (Signed)
Called patient. Left message with her daughter.  Normal Cr. Elevated blood sugar.

## 2013-04-08 ENCOUNTER — Telehealth: Payer: Self-pay | Admitting: *Deleted

## 2013-04-08 ENCOUNTER — Telehealth: Payer: Self-pay | Admitting: Family Medicine

## 2013-04-08 DIAGNOSIS — G5621 Lesion of ulnar nerve, right upper limb: Secondary | ICD-10-CM

## 2013-04-08 NOTE — Telephone Encounter (Signed)
Pt has now been certified with orange card and needs to be set up for PT for her hand. pls let her know

## 2013-04-08 NOTE — Telephone Encounter (Signed)
Pt referral placed

## 2013-04-08 NOTE — Telephone Encounter (Signed)
Pt needs referal put in for physical therapy.  Veronica Simmons, SMA

## 2013-04-09 ENCOUNTER — Ambulatory Visit (INDEPENDENT_AMBULATORY_CARE_PROVIDER_SITE_OTHER): Payer: No Typology Code available for payment source | Admitting: *Deleted

## 2013-04-09 DIAGNOSIS — Z111 Encounter for screening for respiratory tuberculosis: Secondary | ICD-10-CM

## 2013-04-09 NOTE — Progress Notes (Signed)
Patient here today for PPD.  PPD placed on left ventral forearm.  Patient informed to return in 48-72 hours to have site read.  Nolene Ebbs, RN

## 2013-04-11 ENCOUNTER — Ambulatory Visit (INDEPENDENT_AMBULATORY_CARE_PROVIDER_SITE_OTHER): Payer: No Typology Code available for payment source | Admitting: *Deleted

## 2013-04-11 ENCOUNTER — Encounter: Payer: Self-pay | Admitting: *Deleted

## 2013-04-11 DIAGNOSIS — Z111 Encounter for screening for respiratory tuberculosis: Secondary | ICD-10-CM

## 2013-04-11 LAB — TB SKIN TEST
Induration: 0 mm
TB Skin Test: NEGATIVE

## 2013-04-11 NOTE — Progress Notes (Signed)
PPD Reading Note PPD read and results entered in [place]. Result: 0 mm induration. Interpretation: Negative Nolene Ebbs, RN

## 2013-04-17 ENCOUNTER — Telehealth: Payer: Self-pay | Admitting: Family Medicine

## 2013-04-17 DIAGNOSIS — E1140 Type 2 diabetes mellitus with diabetic neuropathy, unspecified: Secondary | ICD-10-CM

## 2013-04-17 DIAGNOSIS — E669 Obesity, unspecified: Secondary | ICD-10-CM

## 2013-04-17 DIAGNOSIS — G5621 Lesion of ulnar nerve, right upper limb: Secondary | ICD-10-CM

## 2013-04-17 NOTE — Telephone Encounter (Signed)
Patient is calling to let Dr. Adrian Blackwater know that her [place] has been renewed so she is ready to move forward with the Physical Therapy for her hand.

## 2013-04-18 NOTE — Telephone Encounter (Signed)
Called patient back regarding weight loss.  Alli is too expensive.   Advised increasing metformin to 1000 mg BID. Advised green tea extract safe and effective, patient may experience palpitations. If so she will need to cut back on intake.   Advised clinical nutritionist, Iver Nestle [phone].  Referral placed. Patient will need to call Jeannie.

## 2013-04-18 NOTE — Addendum Note (Signed)
Addended by: Boykin Nearing on: 04/18/2013 10:12 AM   Modules accepted: Orders

## 2013-04-18 NOTE — Telephone Encounter (Signed)
Pt is aware that physical therapy will call her.  She would like to know what she take besides Alli that is cheaper than $45.  Please advise.

## 2013-04-18 NOTE — Telephone Encounter (Signed)
PT referral placed. Please inform patient.

## 2013-04-29 ENCOUNTER — Ambulatory Visit: Payer: No Typology Code available for payment source

## 2013-05-07 ENCOUNTER — Other Ambulatory Visit: Payer: Self-pay | Admitting: Family Medicine

## 2013-05-07 ENCOUNTER — Ambulatory Visit: Payer: No Typology Code available for payment source | Attending: Family Medicine | Admitting: Occupational Therapy

## 2013-05-07 DIAGNOSIS — G5621 Lesion of ulnar nerve, right upper limb: Secondary | ICD-10-CM

## 2013-05-07 DIAGNOSIS — G5620 Lesion of ulnar nerve, unspecified upper limb: Secondary | ICD-10-CM | POA: Insufficient documentation

## 2013-05-07 DIAGNOSIS — M21519 Acquired clawhand, unspecified hand: Secondary | ICD-10-CM | POA: Insufficient documentation

## 2013-05-07 DIAGNOSIS — IMO0001 Reserved for inherently not codable concepts without codable children: Secondary | ICD-10-CM | POA: Insufficient documentation

## 2013-05-09 ENCOUNTER — Ambulatory Visit: Payer: No Typology Code available for payment source | Admitting: *Deleted

## 2013-05-13 ENCOUNTER — Ambulatory Visit: Payer: No Typology Code available for payment source | Admitting: Occupational Therapy

## 2013-05-13 ENCOUNTER — Encounter: Payer: Self-pay | Admitting: Family Medicine

## 2013-05-13 ENCOUNTER — Ambulatory Visit (INDEPENDENT_AMBULATORY_CARE_PROVIDER_SITE_OTHER): Payer: No Typology Code available for payment source | Admitting: Family Medicine

## 2013-05-13 ENCOUNTER — Ambulatory Visit (HOSPITAL_COMMUNITY)
Admission: RE | Admit: 2013-05-13 | Discharge: 2013-05-13 | Disposition: A | Discharge status: Home / Self Care | Payer: No Typology Code available for payment source | Source: Ambulatory Visit | Attending: Family Medicine | Admitting: Family Medicine

## 2013-05-13 VITALS — BP 163/80 | HR 90 | Ht 65.5 in | Wt 226.0 lb

## 2013-05-13 DIAGNOSIS — R1032 Left lower quadrant pain: Secondary | ICD-10-CM

## 2013-05-13 DIAGNOSIS — R252 Cramp and spasm: Secondary | ICD-10-CM | POA: Insufficient documentation

## 2013-05-13 DIAGNOSIS — M76899 Other specified enthesopathies of unspecified lower limb, excluding foot: Secondary | ICD-10-CM | POA: Insufficient documentation

## 2013-05-13 DIAGNOSIS — R109 Unspecified abdominal pain: Secondary | ICD-10-CM | POA: Insufficient documentation

## 2013-05-13 MED ORDER — CYCLOBENZAPRINE HCL 5 MG PO TABS: 5.0000 mg | ORAL_TABLET | Freq: Every day | ORAL | Status: AC

## 2013-05-13 NOTE — Patient Instructions (Signed)
Please go to the hospital and get an x-ray of your left hip. Try taking Flexeril (a muscle relaxer) once per day (see if you can manage in the morning, but take it in the evening if you can't manage the morning) for the next three weeks to help with the cramps.

## 2013-05-13 NOTE — Progress Notes (Signed)
Patient ID: Veronica Simmons, female   DOB: 12-13-1951, 62 y.o.   MRN: [id number] Subjective: The patient is a 62 y.o. year old female who presents today for left leg cramps.  Patient reports that for the last several month she has been having problems with worsening left leg cramps. Beginning in the left inguinal region and seemed to radiate down the inside of her leg. She denies any true weakness or recent falls. Cramps come on intermittently with no obvious inciting factors. She denies any of her type sensations. She describes it as "a charley horse" that gets better with massage. Symptoms have been happening anywhere from several times per week to 1-2 times per day. There are no obviously aggravating positions.  Patient's past medical, social, and family history were reviewed and updated as appropriate. History  Substance Use Topics  . Smoking status: Never Smoker   . Smokeless tobacco: Never Used  . Alcohol Use: No   Objective:  Filed Vitals:   05/13/13 0925  BP: 163/80  Pulse: 90   Gen: No acute distress, obese Left hip: Normal range of motion of the left hip passively with no obvious discomfort with motion. Reflexes are 2+ and symmetric bilateral lower and upper extremities. Strength is 5+ both lower extremities  Assessment/Plan:  Please also see individual problems in problem list for problem-specific plans.

## 2013-05-13 NOTE — Assessment & Plan Note (Signed)
While it is possible that the patient's symptoms are related to her low back pathology, given the location this is somewhat unlikely. If the patient's pain is truly muscle cramps, there is little we will be able to do. However, I will obtain a hip x-ray to rule out hip pathology. We will try increasing her dietary potassium and that is muscle relaxer once a day for the next 3 weeks.

## 2013-05-15 ENCOUNTER — Ambulatory Visit: Payer: No Typology Code available for payment source | Admitting: Occupational Therapy

## 2013-05-20 ENCOUNTER — Ambulatory Visit: Payer: No Typology Code available for payment source | Attending: Family Medicine | Admitting: Occupational Therapy

## 2013-05-20 DIAGNOSIS — M21519 Acquired clawhand, unspecified hand: Secondary | ICD-10-CM | POA: Insufficient documentation

## 2013-05-20 DIAGNOSIS — G5620 Lesion of ulnar nerve, unspecified upper limb: Secondary | ICD-10-CM | POA: Insufficient documentation

## 2013-05-20 DIAGNOSIS — IMO0001 Reserved for inherently not codable concepts without codable children: Secondary | ICD-10-CM | POA: Insufficient documentation

## 2013-05-21 ENCOUNTER — Encounter: Payer: Self-pay | Admitting: Family Medicine

## 2013-05-21 ENCOUNTER — Ambulatory Visit (INDEPENDENT_AMBULATORY_CARE_PROVIDER_SITE_OTHER): Payer: No Typology Code available for payment source | Admitting: Family Medicine

## 2013-05-21 VITALS — BP 173/78 | HR 94 | Temp 97.8°F | Ht 65.5 in | Wt 227.0 lb

## 2013-05-21 DIAGNOSIS — J3489 Other specified disorders of nose and nasal sinuses: Secondary | ICD-10-CM

## 2013-05-21 DIAGNOSIS — R0981 Nasal congestion: Secondary | ICD-10-CM

## 2013-05-21 MED ORDER — FLUTICASONE PROPIONATE 50 MCG/ACT NA SUSP: 2.0000 | Freq: Every day | NASAL | Status: DC

## 2013-05-21 NOTE — Assessment & Plan Note (Signed)
Nasal congestion possibly from allergic rhinitis vs viral infection.  Likely causing obstruction of eustachian tube causing fluid retention of in the middle ear.  Will start flonase to see if this may be helpful for her symptoms.  Discussed limiting use of afrin.

## 2013-05-21 NOTE — Progress Notes (Signed)
  Subjective:    Patient ID: Veronica Simmons, female    DOB: 1951-04-28, 62 y.o.   MRN: [id number]  Sinus Problem    C/o nasal congestion and ear fullness for the past 2-3 weeks.  She has had similar symptoms in the past that has resolved with allergy medications.  She denies sinus pain, purulent drainage or fever.  She does endorse feeling "swimmy headed", denies tinnitus.  She is currently using afrin for the past 3 days with temporary relief of symptoms.    Review of Systems Denies cough, shortness of breath, headache, nausea or vomiting     Objective:   Physical Exam  Constitutional: No distress.  HENT:  Head: Normocephalic and atraumatic.  Nose congested, turbinates edematous  Fluid behind TM bilaterally, non erythematous, no bulging.  Sinuses non tender   Cardiovascular: Normal rate.   Pulmonary/Chest: Effort normal and breath sounds normal. She has no wheezes.  Lymphadenopathy:    She has no cervical adenopathy.          Assessment & Plan:

## 2013-05-21 NOTE — Patient Instructions (Signed)
Thank you for coming in today, it was good to see you Use the nasal spray each day, follow up in two weeks if not improving.

## 2013-05-23 ENCOUNTER — Ambulatory Visit: Payer: No Typology Code available for payment source | Admitting: Occupational Therapy

## 2013-05-23 ENCOUNTER — Telehealth: Payer: Self-pay | Admitting: Family Medicine

## 2013-05-23 NOTE — Telephone Encounter (Signed)
Ms. Rajski went to pick up her rx for Flonase and was told it was $40, which she cannot afford.  Requesting a cheaper version of medication called to pharmacy.  Please let her know when taken care of.  If it is something on the [place] plan, [employer] will honor that price and give to her.

## 2013-05-23 NOTE — Telephone Encounter (Signed)
No other cheaper versions of prescription medication.  Nasacort works well and is over the counter now, cost is around [employer]

## 2013-05-27 ENCOUNTER — Ambulatory Visit: Payer: No Typology Code available for payment source | Admitting: Occupational Therapy

## 2013-05-29 ENCOUNTER — Ambulatory Visit: Payer: No Typology Code available for payment source | Admitting: Occupational Therapy

## 2013-06-03 ENCOUNTER — Ambulatory Visit: Payer: No Typology Code available for payment source | Admitting: Occupational Therapy

## 2013-06-03 ENCOUNTER — Ambulatory Visit (INDEPENDENT_AMBULATORY_CARE_PROVIDER_SITE_OTHER): Payer: No Typology Code available for payment source | Admitting: Family Medicine

## 2013-06-03 ENCOUNTER — Telehealth: Payer: Self-pay | Admitting: *Deleted

## 2013-06-03 VITALS — BP 163/82 | HR 86 | Ht 65.5 in | Wt 226.0 lb

## 2013-06-03 DIAGNOSIS — J3489 Other specified disorders of nose and nasal sinuses: Secondary | ICD-10-CM

## 2013-06-03 DIAGNOSIS — I10 Essential (primary) hypertension: Secondary | ICD-10-CM

## 2013-06-03 DIAGNOSIS — R0981 Nasal congestion: Secondary | ICD-10-CM

## 2013-06-03 LAB — BASIC METABOLIC PANEL
Chloride: 95 mEq/L — ABNORMAL LOW (ref 96–112)
Creat: 0.65 mg/dL (ref 0.50–1.10)
Potassium: 4.2 mEq/L (ref 3.5–5.3)
Sodium: 136 mEq/L (ref 135–145)

## 2013-06-03 MED ORDER — BENAZEPRIL-HYDROCHLOROTHIAZIDE 20-12.5 MG PO TABS: 1.0000 | ORAL_TABLET | Freq: Two times a day (BID) | ORAL | Status: DC

## 2013-06-03 MED ORDER — AMOXICILLIN-POT CLAVULANATE 875-125 MG PO TABS: 1.0000 | ORAL_TABLET | Freq: Two times a day (BID) | ORAL | Status: DC

## 2013-06-03 MED ORDER — AMOXICILLIN 500 MG PO CAPS: 500.0000 mg | ORAL_CAPSULE | Freq: Three times a day (TID) | ORAL | Status: AC

## 2013-06-03 NOTE — Patient Instructions (Addendum)
Veronica Simmons,  Thank you for coming in today. Please continue flonase Take amoxicillin for 5 days.  Start new combo blood pressure pill. F/u in 2-4 weeks   Dr. Adrian Blackwater

## 2013-06-03 NOTE — Telephone Encounter (Signed)
[facility] calling to clarify two antibiotics were sent in for patient today, Augmentin and Amoxicillin.  Pharmacy wanting to verify patient needs both filled?  Will fwd to MD for clarification.  Reyana Leisey, Loralyn Freshwater, [place]

## 2013-06-04 NOTE — Assessment & Plan Note (Signed)
A: The etiology of this patient's nasal congestion and fluid retention her middle ear is  most likely allergic. However she has been compliant with Flonase with log of his symptoms. P: Advised patient to continue Flonase. I will also treat her with a course of amoxicillin.

## 2013-06-04 NOTE — Telephone Encounter (Signed)
Callback from patient's pharmacy. Clarify that the order was for the amoxicillin. They filled the order yesterday.

## 2013-06-04 NOTE — Assessment & Plan Note (Signed)
A.: The patient's blood pressure remains consistently above goal. P. change patient's regimen to include hydrochlorothiazide by changing from enalapril to Lotensin HCT 20-12.5 mg twice a day.   normal BMP today.

## 2013-06-04 NOTE — Progress Notes (Signed)
Subjective:     Patient ID: Veronica Simmons, female   DOB: 1951-02-02, 62 y.o.   MRN: [id number]  HPI 62 year old female presents for followup to discuss the following  #1 nasal congestion and left ear pain: Patient was started on Flonase 2 weeks ago with no improvement of symptoms. She complains of persistent nasal congestion and pain in her left ear. She denies fever and current headache. She has not had any recent antibiotics.  #2 hypertension: Patient is compliant with Lotensin and Norvasc. He denies chest pain shortness of breath. She does have intermittent headache.  Review of Systems As per HPI     Objective:   Physical Exam BP 163/82  Pulse 86  Ht 5' 5.5" (1.664 m)  Wt 226 lb (102.513 kg)  BMI 37.02 kg/m2 General appearance: alert, cooperative and no distress Eyes: conjunctivae/corneas clear. PERRL, EOM's intact.  Ears: normal TM and external ear canal right ear and abnormal TM left ear - air-fluid level Nose: no discharge, turbinates pink, swollen Throat: lips, mucosa, and tongue normal; teeth and gums normal    Assessment and Plan:

## 2013-06-05 ENCOUNTER — Ambulatory Visit: Payer: No Typology Code available for payment source | Admitting: Occupational Therapy

## 2013-06-06 ENCOUNTER — Encounter: Payer: Self-pay | Admitting: Family Medicine

## 2013-07-17 ENCOUNTER — Ambulatory Visit (INDEPENDENT_AMBULATORY_CARE_PROVIDER_SITE_OTHER): Payer: No Typology Code available for payment source | Admitting: Family Medicine

## 2013-07-17 ENCOUNTER — Encounter: Payer: Self-pay | Admitting: Family Medicine

## 2013-07-17 VITALS — BP 165/82 | HR 72 | Ht 65.5 in | Wt 223.9 lb

## 2013-07-17 DIAGNOSIS — A6000 Herpesviral infection of urogenital system, unspecified: Secondary | ICD-10-CM

## 2013-07-17 DIAGNOSIS — R300 Dysuria: Secondary | ICD-10-CM

## 2013-07-17 LAB — POCT UA - MICROSCOPIC ONLY

## 2013-07-17 LAB — POCT URINALYSIS DIPSTICK
Bilirubin, UA: NEGATIVE
Glucose, UA: 250
Nitrite, UA: NEGATIVE
Spec Grav, UA: >=1.03
Urobilinogen, UA: 1

## 2013-07-17 MED ORDER — ACYCLOVIR 200 MG PO CAPS: 200.0000 mg | ORAL_CAPSULE | Freq: Every day | ORAL | Status: DC

## 2013-07-17 NOTE — Patient Instructions (Signed)
Thank you for coming in, today! You have several small ulcers on your cervix. This probably explains your pain, itching, and bleeding. You do not appear to have a urinary tract infection or yeast infection. 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 I will prescribe you a medication called acyclovir. You should take 500 mg five times per day for 5 days. If this helps, you may be prescribed this medication chronically, to suppress the infection. Make an appointment to come back in 1 to 2 weeks to see Dr. Skeet Simmer. She will be your new regular doctor. Please feel free to call with any questions or concerns at any time, at [phone]. --Dr. Venetia Maxon  Genital Herpes Genital herpes is a sexually transmitted disease. This means that it is a disease passed by having sex with an infected person. There is no cure for genital herpes. The time between attacks can be months to years. The virus may live in a person but produce no problems (symptoms). This infection can be passed to a baby as it travels down the birth canal (vagina). In a newborn, this can cause central nervous system damage, eye damage, or even death. The virus that causes genital herpes is usually HSV-2 virus. The virus that causes oral herpes is usually HSV-1. The diagnosis (learning what is wrong) is made through culture results. SYMPTOMS  Usually symptoms of pain and itching begin a few days to a week after contact. It first appears as small blisters that progress to small painful ulcers which then scab over and heal after several days. It affects the outer genitalia, birth canal, cervix, penis, anal area, buttocks, and thighs. HOME CARE INSTRUCTIONS   Keep ulcerated areas dry and clean.  Take medications as directed. Antiviral medications can speed up healing. They will not prevent recurrences or cure this infection. These medications can also be taken for suppression if there are frequent  recurrences.  While the infection is active, it is contagious. Avoid all sexual contact during active infections.  Condoms may help prevent spread of the herpes virus.  Practice safe sex.  Wash your hands thoroughly after touching the genital area.  Avoid touching your eyes after touching your genital area.  Inform your caregiver if you have had genital herpes and become pregnant. It is your responsibility to insure a safe outcome for your baby in this pregnancy.  Only take over-the-counter or prescription medicines for pain, discomfort, or fever as directed by your caregiver. SEEK MEDICAL CARE IF:   You have a recurrence of this infection.  You do not respond to medications and are not improving.  You have new sources of pain or discharge which have changed from the original infection.  You have an oral temperature above 102 F (38.9 C).  You develop abdominal pain.  You develop eye pain or signs of eye infection. Document Released: 12/01/2000 Document Revised: 02/26/2012 Document Reviewed: 12/22/2009 [facility] Patient Information 2014 [place], [place].

## 2013-07-17 NOTE — Progress Notes (Signed)
S: Pt is a 62yo female who presents to clinic for SDA for "blood when she wiped" after urinating the morning of this visit. She also has vague pelvic discomfort. She denies frank/active vaginal bleeding and no longer has periods. She denies frank or new external vaginal lesions, but on further questioning after exam (see below) pt reported she has had vulvar blisters before that have been tested and were positive for herpes. The last time this happened was "years ago," but she endorses "off and on" burning pain "inside" similar to her current vague pelvic pain, which has been ongoing also for "years." She has been treated in the past with acyclovir, she thinks, but has not taken this in a long time. Otherwise denies fever/chills, frank vaginal discharge, current or new sexual partners (year 2000 was her last sexual activity).  ROS: As above, but generally pan-positive. See medical student note, below. Most notably, pt complains of insomnia and diarrhea, both of which seem chronic.  O: BP 165/82  Pulse 72  Ht 5' 5.5" (1.664 m)  Wt 223 lb 14.4 oz (101.56 kg)  BMI 36.68 kg/m2 Gen: well-appearing elderly female in NAD, very pleasant Abd: soft, but with some suprapubic tenderness, no masses appreciated GU: no external vaginal/vulvar abnormality, no frank lesions or skin breakdown  Speculum exam: cervix with numerous small, erythematous ulcers but no frank bleeding, vaginal wall lesions, or discharge  Bimanual exam: mild-moderate CMT, no adnexal tenderness or masses, moderate fundal tenderness  Note: CMT and fundal tenderness seemed to be due to pressure placed on cervical ulcers rather than tenderness of the fundus itself  Assessment/Plan: # Genital herpes - Acyclovir x 5 days, then f/u with PCP. See problem list note.  I have independently interviewed and examined this patient in conjunction with the student, whose note is documented below. The above reflects my independent interview, exam, and  assessment/plan. Please see also attached problem list notes from today's visit for specifics for each problem.  Emmaline Kluver, MD PGY-2, [facility]  Subjective:     Patient ID: Veronica Simmons, female   DOB: 1951-09-03, 61 y.o.   MRN: [id number]  HPI Veronica Simmons presents today with a chief complaint of blood on her toilet paper when she was wiping this morning. She was urinating and when she wiped she noticed bright red blood on the paper. She also has noticed vaginal itching that is present off and on since she was a child. She denies any vaginal discharge or unpleasant odor. Occasionally she has the urge to go to the bathroom and when she rushes to the toilet she finds she has already urinated a little in her underwear. She also complains of a sharp abdominal pain on her left side, and occasional diarrhea including a recent episode on this past Saturday.  Review of Systems  Constitutional: Negative for fever and chills.  Respiratory: Negative for cough and shortness of breath.   Cardiovascular: Negative for chest pain.  Gastrointestinal: Positive for abdominal pain and diarrhea. Negative for nausea, vomiting, constipation and blood in stool.  Endocrine: Positive for polyuria.  Genitourinary: Positive for urgency and frequency. Negative for hematuria and menstrual problem.       Vaginal pruritis   Skin: Negative for rash.  Neurological: Negative for light-headedness and headaches.       Objective:   Physical Exam  Constitutional: She is oriented to person, place, and time. She appears well-nourished. No distress.  HENT:  Head: Normocephalic and atraumatic.  Cardiovascular: Normal  rate and regular rhythm.  Exam reveals no gallop and no friction rub.   No murmur heard. Pulmonary/Chest: Effort normal and breath sounds normal. She has no wheezes. She has no rales.  Abdominal: Soft. Bowel sounds are normal. She exhibits no distension and no mass. There is  tenderness. There is no rebound and no guarding.  Genitourinary:  On pelvic exam, performed by Dr. Venetia Maxon, cervix had several small round ulcerative lesions and there was faint blood on cotton swab. Exam was very painful to patient.   On bimanual exam, performed by Dr. Venetia Maxon, revealed some mild to moderate cervical motion tenderness.  Neurological: She is alert and oriented to person, place, and time.  Skin: Skin is warm and dry. No rash noted. No erythema.       Assessment:     1. Vaginal bleeding  2. Vaginitis    Plan:    1. Vaginal bleeding: upon further questioning , patient admitted to being told that she had herpes in the past. Her last sexual encounter was in 2000, but she remembers external labial ulcers in the past. We will treat her with acyclovir 200mg  five times daily for 5 days and see her for a follow up in 1 week. If her symptoms worsen, the patient should call the office for further guidance.  2. Vaginitis: No evidence on exam for a yeast or bacterial infection. Ms. Farrin is 14 years into menopause and could be experience vaginal dryness and/or atrophy. Monitor symptoms and check in with patient at follow up. Treating the herpes flare up may improve the vaginal pruritis.   Patient should also continue with her primary care provider here at the clinic for maintenance of her HTN and Diabetes.

## 2013-07-22 NOTE — Assessment & Plan Note (Signed)
A: Pt with reported history of vulvar lesion/blister culture-diagnosed herpes "years ago." No current vulvar lesions, but numerous small cervical ulcers with vague pelvic pain and an "itching inside"-like sensation ("my friend is itching") and small amount of bleeding after urination (see HPI). Similar symptoms have been "on and off" for years but pt does not recall bleeding before. Pt believes she has been treated in the past with acyclovir.  P: Rx for acyclovir 200 mg 5 times daily for 5 days. Pt instructed to f/u in 1-2 weeks or sooner if no resolution of symptoms with medication. Counseled on red flags that would indicate prompt return to care. Could consider suppression dosing of acyclovir in the future. Also discussed this  with pt's new PCP, Dr. Skeet Simmer. Pt will f/u with her as needed. Advised f/u with PCP for other issues as noted in HPI (insomnia, diarrhea, etc).

## 2013-07-31 ENCOUNTER — Telehealth: Payer: Self-pay | Admitting: Family Medicine

## 2013-07-31 NOTE — Telephone Encounter (Signed)
Please advise- Tildon Husky, RN-BSN

## 2013-07-31 NOTE — Telephone Encounter (Signed)
Pt is calling for a new hand brace. She received one from the PT when she finished in January but it has broken and she now needs a new one. JW

## 2013-08-01 NOTE — Telephone Encounter (Signed)
I do not see any notes regarding hand brace from PT. If they are the ones that initially wrote the Rx they should probably be the ones that re-order the brace. However I am happy to see her here again in clinic if she is continuing to have difficulty with her hands/wrists. thx

## 2013-08-22 ENCOUNTER — Telehealth: Payer: Self-pay | Admitting: Family Medicine

## 2013-08-22 NOTE — Telephone Encounter (Signed)
The patient is calling because she is out of refills on her blood pressure medication (we could not determine the name of), also, the dose of her Metformin was increased to 2000mg  so she has run out of it to soon so she needs a new Rx to reflect the increase and she needs refills on that as well.  All of this needs to be sent to [facility] on [address].

## 2013-08-22 NOTE — Telephone Encounter (Signed)
Will forward to MD. Jazmin Hartsell,CMA  

## 2013-08-25 ENCOUNTER — Ambulatory Visit (INDEPENDENT_AMBULATORY_CARE_PROVIDER_SITE_OTHER): Payer: No Typology Code available for payment source | Admitting: Family Medicine

## 2013-08-25 ENCOUNTER — Encounter: Payer: Self-pay | Admitting: Family Medicine

## 2013-08-25 ENCOUNTER — Telehealth: Payer: Self-pay | Admitting: Family Medicine

## 2013-08-25 VITALS — BP 159/77 | HR 86 | Temp 98.0°F | Ht 65.5 in | Wt 225.0 lb

## 2013-08-25 DIAGNOSIS — E1149 Type 2 diabetes mellitus with other diabetic neurological complication: Secondary | ICD-10-CM

## 2013-08-25 DIAGNOSIS — R232 Flushing: Secondary | ICD-10-CM

## 2013-08-25 DIAGNOSIS — G5620 Lesion of ulnar nerve, unspecified upper limb: Secondary | ICD-10-CM

## 2013-08-25 DIAGNOSIS — E1142 Type 2 diabetes mellitus with diabetic polyneuropathy: Secondary | ICD-10-CM

## 2013-08-25 DIAGNOSIS — E669 Obesity, unspecified: Secondary | ICD-10-CM

## 2013-08-25 DIAGNOSIS — E1140 Type 2 diabetes mellitus with diabetic neuropathy, unspecified: Secondary | ICD-10-CM

## 2013-08-25 DIAGNOSIS — I10 Essential (primary) hypertension: Secondary | ICD-10-CM

## 2013-08-25 DIAGNOSIS — A6000 Herpesviral infection of urogenital system, unspecified: Secondary | ICD-10-CM

## 2013-08-25 DIAGNOSIS — G5621 Lesion of ulnar nerve, right upper limb: Secondary | ICD-10-CM

## 2013-08-25 MED ORDER — GLIPIZIDE ER 10 MG PO TB24: 10.0000 mg | ORAL_TABLET | Freq: Every day | ORAL | Status: DC

## 2013-08-25 MED ORDER — PAROXETINE HCL 10 MG PO TABS: 10.0000 mg | ORAL_TABLET | Freq: Every day | ORAL | Status: DC

## 2013-08-25 MED ORDER — GABAPENTIN 300 MG PO CAPS: 600.0000 mg | ORAL_CAPSULE | Freq: Every day | ORAL | Status: DC

## 2013-08-25 MED ORDER — BENAZEPRIL HCL 40 MG PO TABS: 40.0000 mg | ORAL_TABLET | Freq: Every day | ORAL | Status: DC

## 2013-08-25 MED ORDER — AMLODIPINE BESYLATE 10 MG PO TABS: 10.0000 mg | ORAL_TABLET | Freq: Every day | ORAL | Status: DC

## 2013-08-25 MED ORDER — METFORMIN HCL 1000 MG PO TABS: 1000.0000 mg | ORAL_TABLET | Freq: Two times a day (BID) | ORAL | Status: DC

## 2013-08-25 NOTE — Assessment & Plan Note (Signed)
A: slight decline with rise in A1c. Weight stable.  P: Diabetic foot exam done. Patient left before retinal scan could be completed. She declined pneumovax.

## 2013-08-25 NOTE — Patient Instructions (Addendum)
Ms. Spratling,  Thank you very much for coming in to see me today. Was a pleasure seeing you again. Well-done with your diabetes. As continue to take metformin and glipizide as prescribed. As continue to exercise regularly. I do recommend exercising most days of the week which is 4 days at least weekly.  Regarding her hypertension: I have refilled her blood pressure medication.  I removed hydrochlorothiazide from  benazepril. Please pick up the new prescriptions and start them today. He may take Norvasc at night and benazepril in the morning.  Regarding her hot flashes, insomnia, stress: Please start paroxetine nightly.  50% of patients will have sedation with this medication and 50% will have stimulation. If you find that taking this medication at night worsens your insomnia the options are to take it during the day or 2 at a low dose of trazodone to this medicine. The major side effects and this class of medication is suicidality.  Please call if he develops suicidal thoughts please call and stop taking the medication.   Followup with Dr. Skeet Simmer, your new PCP, in 3 weeks to followup blood pressure and insomnias, hot flashes, stress. Please schedule your mammogram.   Dr. Adrian Blackwater

## 2013-08-25 NOTE — Assessment & Plan Note (Addendum)
A: declined. We discussed starting paroxetine which would help with vasomotor symptoms and in 50% of users is sedating, so would also help with restlessness. Patient decided against this medication when I was counseling her regarding risk of SI.  P: Patient to restart evening primrose oil.

## 2013-08-25 NOTE — Assessment & Plan Note (Addendum)
A: declined due to medication noncompliance. P: Medications refilled.  Changed from lotensin HCTZ 20-12.5 to lotensin 40 in order to improve glycemic control.  Patient to f/u in 6 weeks for BP recheck. She has tolerated ACE inhibitor very well in the past, so no need to repeat BMP in 2 weeks. Recommend repeat BMP at 6 week f/u.

## 2013-08-25 NOTE — Progress Notes (Signed)
Subjective:     Patient ID: Veronica Simmons, female   DOB: Apr 23, 1951, 62 y.o.   MRN: [id number]  HPI 62 yo F presents for f/u. She was previously uninsured but now has the orange card.  #1 DM2: complaint with metformin and glipizide. She is not currently taking gabapentin for neuropathy  Does not check blood sugars.  Denies symptomatic hypoglycemia. +: nocturia. Tingling in feet at night.  -: CP, SOB, N/V/D, vision changes.  Eye exam: due. Agreed to retina screen. Foot exam: due.   2. HTN:  Out of Norvasc x two weeks.  ROS as per above   3. Insomnia: wakes up at night with hot flashes and to urinate. Reports worsening hot flashes since running out of evening primrose. Wakes up 2-3 times per night. Admits to day time somnolence. Admits to stress and depressed mood. Has fleeting SI. No plan for suicide.   4. Obesity: trouble losing weight. Would like weight loss medication. Works out 3 times per week. Overeats when stressed.   5. Cubital tunnel syndrome R hand: has gone to PT. Had splint made. Improved with splint. Splint is broken.   6. Health maintenance: declined flu shot and pneumovax. Declined zostavax due to cost.  Due for pap smear, mammogram, colonoscopy.   Review of Systems As per HPI     Objective:   Physical Exam BP 159/77  Pulse 86  Temp(Src) 98 F (36.7 C) (Oral)  Ht 5' 5.5" (1.664 m)  Wt 225 lb (102.059 kg)  BMI 36.86 kg/m2 General appearance: alert, cooperative and no distress Extremities: extremities normal, atraumatic, no cyanosis or edema Contractures in R hand.  Diabetic foot exam done and documented.     Assessment and Plan:

## 2013-08-25 NOTE — Assessment & Plan Note (Addendum)
A: improved with splinting. P: order for replacement splint faxed.  Attn: [facility]  Appt for pt is Sept 10  Stacy [phone]  Fax: [phone]

## 2013-08-25 NOTE — Telephone Encounter (Signed)
Stacy with [facility] states the splint for the pt cannot be repaired. She needs a new order for a splint to be made. Appt for pt is Sept 10  Stacy  [phone] Fax:  [phone] Can send to Stacy's attention.

## 2013-08-25 NOTE — Assessment & Plan Note (Signed)
A: unchanged. Counseled patient that consistent healthy diet and exercise is safer and better tolerated than any weight loss medication. She is still very interested in weight lost medication, as she has been since becoming a patient here. P: Encourage regular exercise.  Increase exercise from 3 times weekly to minimum of 4x weekly.

## 2013-08-25 NOTE — Telephone Encounter (Signed)
Noted. Order faxed.  

## 2013-08-25 NOTE — Assessment & Plan Note (Signed)
Cannot afford acyclovir.

## 2013-08-27 ENCOUNTER — Ambulatory Visit: Payer: No Typology Code available for payment source | Admitting: Occupational Therapy

## 2013-09-18 ENCOUNTER — Ambulatory Visit: Payer: No Typology Code available for payment source | Admitting: Occupational Therapy

## 2013-09-24 ENCOUNTER — Ambulatory Visit: Payer: Self-pay

## 2013-10-02 ENCOUNTER — Ambulatory Visit: Payer: Self-pay

## 2013-10-08 ENCOUNTER — Ambulatory Visit: Payer: Self-pay

## 2013-10-13 ENCOUNTER — Ambulatory Visit: Payer: Self-pay

## 2013-11-05 ENCOUNTER — Encounter: Payer: Self-pay | Admitting: Family Medicine

## 2013-11-05 ENCOUNTER — Ambulatory Visit (INDEPENDENT_AMBULATORY_CARE_PROVIDER_SITE_OTHER): Payer: Self-pay | Admitting: Family Medicine

## 2013-11-05 VITALS — BP 168/72 | HR 106 | Temp 97.7°F | Ht 65.5 in | Wt 221.0 lb

## 2013-11-05 DIAGNOSIS — E1142 Type 2 diabetes mellitus with diabetic polyneuropathy: Secondary | ICD-10-CM

## 2013-11-05 DIAGNOSIS — E1140 Type 2 diabetes mellitus with diabetic neuropathy, unspecified: Secondary | ICD-10-CM

## 2013-11-05 DIAGNOSIS — E1149 Type 2 diabetes mellitus with other diabetic neurological complication: Secondary | ICD-10-CM

## 2013-11-05 DIAGNOSIS — B373 Candidiasis of vulva and vagina: Secondary | ICD-10-CM

## 2013-11-05 MED ORDER — FLUCONAZOLE 150 MG PO TABS: 150.0000 mg | ORAL_TABLET | Freq: Once | ORAL | Status: DC

## 2013-11-05 NOTE — Assessment & Plan Note (Signed)
Uncontrolled. Last A1C was 7.7 but CBG consistently high. Will con't current regimen for now, and follow up with Dr. Valentina Lucks for diabetes education. Patient states she uses her daughter's meter. I have instructed her to get the [employer] meter since she does not have any insurance. She should write down all of her numbers to bring to Dr. Valentina Lucks and she agrees with this plan. RTC as needed.

## 2013-11-05 NOTE — Progress Notes (Signed)
Patient ID: Veronica Simmons, female   DOB: [birth year], 62 y.o.   MRN: [id number]     Subjective: HPI: Patient is a 62 y.o. female presenting to clinic today for same day appointment for elevated blood sugars.  Hyperglycemia- Usually checks blood sugars in the mornings. She states in the morning her blood sugars are 200-300's. She states she has not changed her diet lately. She reports a vaginal rash, no other known infection. She feels tired and no energy. She takes Metformin 1000mg  in morning and 500mg  at night. Glipizide 10mg  once in the morning. She rarely checks her blood sugar in the afternoons and it is "a little lower" but still >200mg . She has never met with Dr. Valentina Lucks. PCP is Dr. Skeet Simmer.  Vaginal discharge- She reports itching, burning and irritation. She reports heavy discharge which is typical for her. She has used barrier cream which is helping with the irritation.   History Reviewed: Never smoker. Health Maintenance: Declined flu shot  ROS: Please see HPI above.  Objective: Office vital signs reviewed. BP 168/72  Pulse 106  Temp(Src) 97.7 F (36.5 C) (Oral)  Ht 5' 5.5" (1.664 m)  Wt 221 lb (100.245 kg)  BMI 36.20 kg/m2  Physical Examination:  General: Awake, alert. NAD HEENT: Atraumatic, normocephalic Neck: No masses palpated. No LAD Pulm: CTAB, no wheezes Cardio: RRR, no murmurs appreciated Abdomen:+BS, soft, nontender, nondistended Extremities: No edema Neuro: Grossly intact  Assessment: 62 y.o. female with hyperglycemia resulting in yeast vaginitis   Plan: See Problem List and After Visit Summary

## 2013-11-05 NOTE — Patient Instructions (Signed)
I am sorry you are not feeling well.  I have sent in Diflucan for your yeast infection. Take one pill now, and if you are not better in 3 days, you can take the other one. Use a diaper rash cream to help protect your skin.  For your blood sugar, please make an appointment to see Dr. Valentina Lucks in pharmacy clinic to discuss what to do next. Please write down all of your blood sugars, including the time you checked it, for him to look that.  Please follow up with Dr. Skeet Simmer for a regular check up within the next month.  Veronica Simmons, M.D.

## 2013-11-05 NOTE — Assessment & Plan Note (Signed)
Likely secondary to elevated CBG. Will treat with Diflucan now, and repeat in 72 hours if persists. Use barrier cream to help with healing of skin.

## 2013-11-07 ENCOUNTER — Ambulatory Visit (INDEPENDENT_AMBULATORY_CARE_PROVIDER_SITE_OTHER): Payer: Self-pay | Admitting: Pharmacist

## 2013-11-07 ENCOUNTER — Encounter: Payer: Self-pay | Admitting: Pharmacist

## 2013-11-07 VITALS — BP 165/72 | HR 83 | Ht 65.5 in | Wt 225.0 lb

## 2013-11-07 DIAGNOSIS — E785 Hyperlipidemia, unspecified: Secondary | ICD-10-CM

## 2013-11-07 DIAGNOSIS — E1140 Type 2 diabetes mellitus with diabetic neuropathy, unspecified: Secondary | ICD-10-CM

## 2013-11-07 DIAGNOSIS — E1149 Type 2 diabetes mellitus with other diabetic neurological complication: Secondary | ICD-10-CM

## 2013-11-07 DIAGNOSIS — E1142 Type 2 diabetes mellitus with diabetic polyneuropathy: Secondary | ICD-10-CM

## 2013-11-07 DIAGNOSIS — I10 Essential (primary) hypertension: Secondary | ICD-10-CM

## 2013-11-07 MED ORDER — BENAZEPRIL HCL 40 MG PO TABS: 40.0000 mg | ORAL_TABLET | Freq: Every day | ORAL | Status: DC

## 2013-11-07 MED ORDER — DAPAGLIFLOZIN PROPANEDIOL 5 MG PO TABS: 1.0000 | ORAL_TABLET | Freq: Every day | ORAL | Status: DC

## 2013-11-07 MED ORDER — METFORMIN HCL 1000 MG PO TABS: 1000.0000 mg | ORAL_TABLET | Freq: Two times a day (BID) | ORAL | Status: DC

## 2013-11-07 MED ORDER — AMLODIPINE BESYLATE 10 MG PO TABS: 10.0000 mg | ORAL_TABLET | Freq: Every day | ORAL | Status: DC

## 2013-11-07 MED ORDER — GLUCOSE BLOOD VI STRP: ORAL_STRIP | Status: DC

## 2013-11-07 NOTE — Progress Notes (Addendum)
S:    Patient arrives in good spirits and is very open discussion about her disease states.    Presents for diabetes management and medications for weight loss. Patient reports having history of Diabetes and hypertenstion since 2002. Patient also reports she has taken metformin for 10 years. During the medication review she reports taking a lot of herbals but doesn't know the strength or doses of any of the herbals. She is motivated to lose weight, and wants a medication to help with that. She has recently been recertified for the orange card. She stopped taking amlodipine 10 mg because she could not afford the medication. She also states her glucometer is broken and she is relying on her daughter's glucometer.    O:  . Lab Results  Component Value Date   HGBA1C 7.7 08/25/2013     home fasting CBG readings of ~200.  2 hour post-prandial/random CBG readings of ~300.  A/P: Diabetes diagnosed in 2002 currently uncontrolled. Denies hypoglycemic events and is able to verbalize appropriate hypoglycemia management plan.  Reports adherence with medication with the exception of the amlodipine.  Diabetes control is currently suboptimal due to diet and daily stress. After significant discussion about Victoza, the patient is resistant to starting an injectable medication. Started Farxiga 5 mg daily. We signed the patient up for the assistance program with Dr. Gwendlyn Deutscher as the prescribing physician. (Patient will get drug for FREE as long as Dr. Gwendlyn Deutscher remains prescribing physician).  Patient was counseled on the adverse reactions to this medication. She expressed understanding and all questions were answered. [facility] the [facility]'s [facility] to help with medication cost.    Blood pressure continues to be uncontrolled. We reinitiated amlodipine 10 mg. The prescription for 1 month is $4 at [facility].   We recommended she initiate aspirin 81mg  every day. She states she will consider the addition.    Extensive counseling to consider initiation of Crestor (rosuvastatin). Patient is not willing to try any medication and is aware of the risks associated with not initiating lipid lowering therapy. She is content using garlic as a lipid lowering agent despite education of the lowering level necessary for cardioprotection.   Written patient instructions provided.  Follow up in [facility] in the second or third week of December to follow up with her blood pressure after starting amlodipine. Total time in face to face counseling 60 minutes.  Patient seen with Veronica Simmons, PharmD Candidate, and Wilfred Curtis, PharmD Resident..   Addendum: FMTS Attending  Note: Veronica Eniola,MD I  have seen this patient, reviewed her chart. I have discussed this patient with the Dr Maryjean Ka. I agree with Dr Julieanne Cotton findings, assessment and care plan. Plan to monitor kidney function regularly for patient started on Farxiga.

## 2013-11-07 NOTE — Assessment & Plan Note (Addendum)
Diabetes diagnosed in 2002 currently uncontrolled. Denies hypoglycemic events and is able to verbalize appropriate hypoglycemia management plan.  Reports adherence with medication with the exception of the amlodipine.  Diabetes control is currently suboptimal due to diet and daily stress. After significant discussion about Victoza, the patient is resistant to starting an injectable medication. Started Farxiga 5 mg daily. We signed the patient up for the assistance program with Dr. Gwendlyn Deutscher as the prescribing physician. (Patient will get drug for FREE as long as Dr. Gwendlyn Deutscher remains prescribing physician).  Patient was counseled on the adverse reactions to this medication. She expressed understanding and all questions were answered. [facility] the [facility]'s [facility] to help with medication cost.    Patient will go to [facility] dept. For new meter AND strips.   New Rx called to [facility] dept pharmacy.   Written patient instructions provided.  Follow up in [facility] in the second or third week of December to follow up with her blood pressure after starting amlodipine. Total time in face to face counseling 60 minutes.  Patient seen with Elisabeth Cara, PharmD Candidate, and Wilfred Curtis, PharmD Resident.Marland Kitchen

## 2013-11-07 NOTE — Patient Instructions (Signed)
Thanks for coming in today!  We sent a prescription for Farxiga, amlodipine, benazepril, and metformin to [facility]. Please start taking your amlodipine in the evening.   We called in a meter and strips to [facility]. Their phone number is [phone].  Consider taking one baby aspirin every day for your heart.   When you come for your next appointment, please bring your blood sugar logs and bring a list of your herbals.   Schedule an appointment to come back and see [place] the second or third week of December so we can check your blood pressure.

## 2013-11-07 NOTE — Assessment & Plan Note (Signed)
Blood pressure continues to be uncontrolled. We reinitiated amlodipine 10 mg. The prescription for 1 month is $4 at [facility].   We recommended she initiate aspirin 81mg  every day. She states she will consider the addition.

## 2013-11-07 NOTE — Assessment & Plan Note (Signed)
Extensive counseling to consider initiation of Crestor (rosuvastatin). Patient is not willing to try any medication and is aware of the risks associated with not initiating lipid lowering therapy. She is content using garlic as a lipid lowering agent despite education of the lowering level necessary for cardioprotection.

## 2013-11-10 NOTE — Progress Notes (Signed)
Patient ID: Veronica Simmons, female   DOB: [birth year], 62 y.o.   MRN: [id number] Reviewed: Agree with Dr. Graylin Shiver documentation and management.

## 2013-12-26 ENCOUNTER — Ambulatory Visit: Payer: No Typology Code available for payment source

## 2013-12-26 ENCOUNTER — Telehealth: Payer: Self-pay | Admitting: Family Medicine

## 2013-12-26 NOTE — Telephone Encounter (Signed)
Clinic portion completed and placed in MDs box. Everhett Bozard,CMA

## 2013-12-26 NOTE — Telephone Encounter (Signed)
Form mailed to patient. Jazmin Hartsell,CMA

## 2013-12-26 NOTE — Telephone Encounter (Signed)
Form filled for foster parent program

## 2013-12-26 NOTE — Telephone Encounter (Signed)
Patient dropped off paper to be filled out for her to become a foster parent.  Please mail to her when completed.

## 2013-12-31 NOTE — Telephone Encounter (Signed)
Documentation already completed and mailed to patient as per note by jazmin hartsell on 12/26/12

## 2014-01-02 ENCOUNTER — Ambulatory Visit: Payer: No Typology Code available for payment source | Attending: Family Medicine | Admitting: Occupational Therapy

## 2014-01-02 DIAGNOSIS — M25549 Pain in joints of unspecified hand: Secondary | ICD-10-CM | POA: Insufficient documentation

## 2014-01-02 DIAGNOSIS — IMO0001 Reserved for inherently not codable concepts without codable children: Secondary | ICD-10-CM | POA: Insufficient documentation

## 2014-01-05 ENCOUNTER — Telehealth: Payer: Self-pay | Admitting: Family Medicine

## 2014-01-05 DIAGNOSIS — G5620 Lesion of ulnar nerve, unspecified upper limb: Secondary | ICD-10-CM

## 2014-01-05 NOTE — Telephone Encounter (Signed)
Pt went to [facility]) about her Splint ,they saw pt on 01/02/2014. Now [facility](PT) is requesting a referral. Could you please make a referral for this pt.  Thank You   Marines

## 2014-01-07 ENCOUNTER — Encounter: Payer: Self-pay | Admitting: Family Medicine

## 2014-01-07 ENCOUNTER — Ambulatory Visit (HOSPITAL_COMMUNITY)
Admission: RE | Admit: 2014-01-07 | Discharge: 2014-01-07 | Disposition: A | Discharge status: Home / Self Care | Payer: No Typology Code available for payment source | Source: Ambulatory Visit | Attending: Family Medicine | Admitting: Family Medicine

## 2014-01-07 ENCOUNTER — Ambulatory Visit (INDEPENDENT_AMBULATORY_CARE_PROVIDER_SITE_OTHER): Payer: No Typology Code available for payment source | Admitting: Family Medicine

## 2014-01-07 VITALS — BP 147/66 | HR 96 | Temp 98.6°F | Ht 65.5 in | Wt 220.0 lb

## 2014-01-07 DIAGNOSIS — R002 Palpitations: Secondary | ICD-10-CM

## 2014-01-07 DIAGNOSIS — I499 Cardiac arrhythmia, unspecified: Secondary | ICD-10-CM | POA: Insufficient documentation

## 2014-01-07 NOTE — Assessment & Plan Note (Signed)
Likely due to herbal treatment for weight loss. Normal sinus rhythm on ECG unchanged from previous, TSH last normal in 2013, last hemoglobin normal in 2013, FOBT negative. Counseled to discontinue her herbal medication and avoid caffeine. If continues, return to clinic for further evaluation and possible Holter monitoring.

## 2014-01-07 NOTE — Progress Notes (Signed)
Patient ID: Aldyn Chaudry, female   DOB: 18-Aug-1951, 63 y.o.   MRN: [id number]   Subjective:  HPI:  Azhar Bohach is a 63 y.o. female with a history of obesity, hypertension, T2DM with neuropathy and hyperlipidemia here for evaluation of palpitations.  She reports an approximately one-month history of intermittent nonsustained heart palpitations. She denies having had this ever before. It seems to coincide with her taking herbal treatment for weight loss. She is unsure of the name, but it was recommended by Dr. Irena Cords.  She denies associated symptoms, including chest pain, shortness of breath, dizziness, vision changes, syncope. Denies blood in stool, vaginal bleeding. Staying hydrated.   Review of Systems:  Per HPI. All other systems reviewed and are negative.    Past Medical History: Patient Active Problem List   Diagnosis Date Noted  . Yeast vaginitis 11/05/2013  . Recurrent genital herpes 07/17/2013  . Hot flashes not due to menopause 11/27/2012  . Cubital tunnel syndrome on right 10/31/2012  . Type 2 diabetes, controlled, with neuropathy 02/14/2007  . HYPERLIPIDEMIA 02/14/2007  . OBESITY, NOS 02/14/2007  . HYPERTENSION, BENIGN SYSTEMIC 02/14/2007  . ARTHRITIS 02/14/2007  . INSOMNIA NOS 02/14/2007   Medications: reviewed and updated Current Outpatient Prescriptions  Medication Sig Dispense Refill  . amLODipine (NORVASC) 10 MG tablet Take 1 tablet (10 mg total) by mouth daily.  90 tablet  3  . aspirin EC 81 MG tablet Take 1 tablet (81 mg total) by mouth daily.  30 tablet  3  . benazepril (LOTENSIN) 40 MG tablet Take 1 tablet (40 mg total) by mouth daily.  90 tablet  3  . Dapagliflozin Propanediol 5 MG TABS Take 1 tablet by mouth daily.  30 tablet  2  . Evening Primrose Oil CAPS Take by mouth.      . fluconazole (DIFLUCAN) 150 MG tablet Take 1 tablet (150 mg total) by mouth once.  1 tablet  1  . gabapentin (NEURONTIN) 300 MG capsule Take 2 capsules (600 mg total) by mouth at  bedtime.  180 capsule  3  . glipiZIDE (GLUCOTROL XL) 10 MG 24 hr tablet Take 1 tablet (10 mg total) by mouth daily.  90 tablet  3  . glucose blood (TRUETEST TEST) test strip Use as instructed- dispense 1 box QS for twice daily testing.  100 each  12  . metFORMIN (GLUCOPHAGE) 1000 MG tablet Take 1 tablet (1,000 mg total) by mouth 2 (two) times daily with a meal.  180 tablet  3   No current facility-administered medications for this visit.   Objective:  Physical Exam: BP 147/66  Pulse 96  Temp(Src) 98.6 F (37 C) (Oral)  Ht 5' 5.5" (1.664 m)  Wt 220 lb (99.791 kg)  BMI 36.04 kg/m2  Gen: Overweight, pleasant 63 y.o. female in NAD HEENT: MMM, EOMI, PERRL, anicteric sclerae, no conjunctival pallor CV: RRR, no MRG, no JVD Resp: Non-labored, CTAB, no wheezes noted Abd: Soft, NTND, BS present, no guarding or organomegaly MSK: No edema noted, full ROM Neuro: Alert and oriented, speech normal    Assessment:     Briunna Paneto is a 63 y.o. female here for heart palpitations.    Plan:     See problem list for problem-specific plans.

## 2014-01-19 ENCOUNTER — Telehealth: Payer: Self-pay | Admitting: Family Medicine

## 2014-01-19 NOTE — Telephone Encounter (Signed)
Pt has problems wiping her vagina. It seems to crack and bleed when wiping. It is dried out and hurts. Please advise

## 2014-01-20 ENCOUNTER — Ambulatory Visit (INDEPENDENT_AMBULATORY_CARE_PROVIDER_SITE_OTHER): Payer: No Typology Code available for payment source | Admitting: Family Medicine

## 2014-01-20 ENCOUNTER — Other Ambulatory Visit (HOSPITAL_COMMUNITY)
Admission: RE | Admit: 2014-01-20 | Discharge: 2014-01-20 | Disposition: A | Discharge status: Home / Self Care | Payer: No Typology Code available for payment source | Source: Ambulatory Visit | Attending: Family Medicine | Admitting: Family Medicine

## 2014-01-20 ENCOUNTER — Encounter: Payer: Self-pay | Admitting: Family Medicine

## 2014-01-20 VITALS — BP 158/73 | HR 90 | Temp 98.0°F | Ht 65.5 in | Wt 220.0 lb

## 2014-01-20 DIAGNOSIS — N760 Acute vaginitis: Secondary | ICD-10-CM | POA: Insufficient documentation

## 2014-01-20 DIAGNOSIS — Z113 Encounter for screening for infections with a predominantly sexual mode of transmission: Secondary | ICD-10-CM | POA: Insufficient documentation

## 2014-01-20 DIAGNOSIS — B373 Candidiasis of vulva and vagina: Secondary | ICD-10-CM

## 2014-01-20 DIAGNOSIS — E669 Obesity, unspecified: Secondary | ICD-10-CM

## 2014-01-20 DIAGNOSIS — N898 Other specified noninflammatory disorders of vagina: Secondary | ICD-10-CM

## 2014-01-20 DIAGNOSIS — N950 Postmenopausal bleeding: Secondary | ICD-10-CM

## 2014-01-20 DIAGNOSIS — N939 Abnormal uterine and vaginal bleeding, unspecified: Secondary | ICD-10-CM

## 2014-01-20 DIAGNOSIS — B3731 Acute candidiasis of vulva and vagina: Secondary | ICD-10-CM

## 2014-01-20 NOTE — Assessment & Plan Note (Signed)
A: lost 11lbs since last visit, reassured pt that diet and exercise best ways to lose weight. Esp with pt's recent trial of dr Irena Cords medication which induced palpitations. Per previous notes apparently diet pills have been an ongoing request since pt first started coming to clinic P: routine exercise Referral to Dr. Jenne Campus  Referral to [facility] [place]

## 2014-01-20 NOTE — Progress Notes (Signed)
Patient ID: Veronica Simmons, female   DOB: September 06, 1951, 63 y.o.   MRN: [id number] [facility] Veronica Bell, MD Phone: [phone]  Subjective:  Veronica Simmons is a 63 y.o F who presents to me for an acute care visit to discuss weight loss and vaginal irritation  # weight loss  -pt states that she has been struggling with her weight for quite some time. Endorses trying a healthy diet and exercise. Knows what she is suppose to do but has trouble trying to follow through on regimen. Reads a lot online and has even tried Veronica Simmons diet pills. But states that this medication gave her palpitations  -is down 11lbs since last year  #vaginal irritation -states that she has had persistent vaginal irritation for as long as she can remember. Was last seen by Veronica Simmons on 11/05/13 and tx with diflucan and topical barrier cream -now endorsing genital region feeling like "chapped lips" occasionally with have blood on tp after wiping. Some days worse than others. No pruritis no foul smelling or chunky discharge -denies sexual intercourse for over 15 years -occasionally will have assc pelvic pain  All systems were reviewed and were negative unless otherwise noted in the HPI  Past Medical History Patient Active Problem List   Diagnosis Date Noted  . Palpitations 01/07/2014  . Yeast vaginitis 11/05/2013  . Recurrent genital herpes 07/17/2013  . Hot flashes not due to menopause 11/27/2012  . Cubital tunnel syndrome on right 10/31/2012  . Type 2 diabetes, controlled, with neuropathy 02/14/2007  . HYPERLIPIDEMIA 02/14/2007  . OBESITY, NOS 02/14/2007  . HYPERTENSION, BENIGN SYSTEMIC 02/14/2007  . ARTHRITIS 02/14/2007  . INSOMNIA NOS 02/14/2007   Reviewed problem list.  Medications- reviewed and updated Chief complaint-noted No additions to family history Social history- patient is a never smoker  Objective: BP 158/73  Pulse 90  Temp(Src) 98 F (36.7 C) (Oral)  Ht 5' 5.5"  (1.664 m)  Wt 220 lb (99.791 kg)  BMI 36.04 kg/m2 Gen: NAD, alert, cooperative with exam Abd: SNTND, BS present, no guarding or organomegaly Genital exam: labia normal without signs of excoriation, vaginal canal non erythematous no lesions, nml vaginal discharge. Cervix with irritation and sml cyst present at 12olock. No bleeding present Ext: No edema, warm, normal tone, moves UE/LE spontaneously Neuro: Alert and oriented, No gross deficits Skin: no rashes no lesions  Assessment/Plan: See problem based a/p

## 2014-01-20 NOTE — Telephone Encounter (Signed)
Called pt this AM, no answer, left voice mail.  Pt need to schedule appt with PCP.  Derl Barrow, RN

## 2014-01-20 NOTE — Patient Instructions (Signed)
Veronica Simmons, I am sorry that you are not feeling well lately. I will let you know as soon as I get the results back from the testing Avoid soap products on the vaginal area. Wash only with water and manual cleansing Wear cotton panties with white panty liners only Do not douche or use perfumed products Please f/up with the ultrasound to unsure that the lining of your uterus looks normal  I have placed the referrals for our nutritionist and the diabetic education center If neither of these resources work for you please let me know See you back in 1-3 months or sooner as needed  Bernadene Bell, MD

## 2014-01-20 NOTE — Assessment & Plan Note (Signed)
A: hx of genital herpes (but cannot afford acyclovir) no active lesions present. No gross vaginal bleeding but with hx of intermittent spotting on toilet paper am concerned for endometrial dysplasia (in post menopausal woman) P: GCCT, bv, trich, yeast sent  Will treat re results of cultures Instructed pt to avoid soap and cleanse manually with water No douching, white panty liners only, no perfume  Sending for pelvic/transvaginal US to eval endometrial strip Plan to obtain PAP at next visit

## 2014-01-26 ENCOUNTER — Telehealth: Payer: Self-pay | Admitting: Family Medicine

## 2014-01-26 NOTE — Telephone Encounter (Signed)
LVM for patient regarding negative culture results. It may be that she has a component of atrophic vaginitis. I would like to see her back in the office in the next few weeks after she gets her ultrasound done to discuss more options going forward. Could we let her know?  Thx [facility] MD

## 2014-01-27 ENCOUNTER — Ambulatory Visit (HOSPITAL_COMMUNITY)
Admission: RE | Admit: 2014-01-27 | Discharge: 2014-01-27 | Disposition: A | Discharge status: Home / Self Care | Payer: No Typology Code available for payment source | Source: Ambulatory Visit | Attending: Family Medicine | Admitting: Family Medicine

## 2014-01-27 DIAGNOSIS — D259 Leiomyoma of uterus, unspecified: Secondary | ICD-10-CM | POA: Insufficient documentation

## 2014-01-27 DIAGNOSIS — N950 Postmenopausal bleeding: Secondary | ICD-10-CM | POA: Insufficient documentation

## 2014-02-09 ENCOUNTER — Telehealth: Payer: Self-pay | Admitting: Family Medicine

## 2014-02-09 NOTE — Telephone Encounter (Signed)
Pt called and would like to know what her ultra sound results are. jw

## 2014-02-13 ENCOUNTER — Other Ambulatory Visit: Payer: Self-pay | Admitting: *Deleted

## 2014-02-13 MED ORDER — ESTROGENS, CONJUGATED 0.625 MG/GM VA CREA: 1.0000 | TOPICAL_CREAM | Freq: Every day | VAGINAL | Status: DC

## 2014-02-13 MED ORDER — DAPAGLIFLOZIN PROPANEDIOL 5 MG PO TABS: 1.0000 | ORAL_TABLET | Freq: Every day | ORAL | Status: DC

## 2014-02-13 NOTE — Telephone Encounter (Signed)
Spoke to patient regarding results of recent testing. [place] nml but still having daily itching and skin dryness/breakdown. Will prescribe premarin daily for the next few weeks to see if this improves pt symptoms. She is amenable to rtc if sx do not improve. [facility], MD

## 2014-02-13 NOTE — Telephone Encounter (Signed)
Pt called and needs refills on her medication and also what her ultrasound results were. jw

## 2014-02-16 ENCOUNTER — Telehealth: Payer: Self-pay | Admitting: Family Medicine

## 2014-02-16 NOTE — Telephone Encounter (Signed)
LMOVM of Health department:  1. Do they carry Premarin?  If so I will call it in  Once I hear back from pharmacy, will call pt and let her know that the orange card does not cover acupuncture. [place], Salome Spotted

## 2014-02-16 NOTE — Telephone Encounter (Signed)
Pt called and needs the Premarin sent to the health dept pharmacy because she has the orange card. She would also like to be referred to an acupuncturist for her hand. jw

## 2014-02-18 NOTE — Telephone Encounter (Signed)
Spoke with Health Department.  Premarin is only available thru the MAP program, pt informed that she would have to go and see if she is eligible for that.  Even if she is it could still take 1-2 months before Premarin would be avaliable.  She wants to know if there is anything else Dr. Skeet Simmer could suggest as the medication @ [facility] is ~$300.  Advised I would send message to MD.     Also pt informed that orange card does not cover acupuncture. Naythan Douthit, Salome Spotted

## 2014-02-19 NOTE — Telephone Encounter (Signed)
I have been trying to look for cheaper options for her. Certainly she can try OTC moisturizers such as Replens. Me Again. Vagisil. Or Feminease. Also I would recommend lubricants during any sort of sexual activity.  [facility], MD

## 2014-02-20 NOTE — Telephone Encounter (Signed)
LMOVM for pt to return call.  Please inform of the below. Veronica Simmons  

## 2014-03-03 ENCOUNTER — Telehealth: Payer: Self-pay | Admitting: Family Medicine

## 2014-03-03 NOTE — Telephone Encounter (Signed)
Would like to know if dr Skeet Simmer can prescribe Adipex which is for weight loss

## 2014-03-05 NOTE — Telephone Encounter (Signed)
Unfortunately this is not a medication I usually prescribe and I am uncomfortable with the dosing and monitoring it would require. It also has many drug drug interactions that can cause potentially harmful/fatal side effects. Has she tried to see our nutritionist here or used the resources as the diabetic education center? I know her complaint in the past is that she knows what to do but can't follow through. Dr. Jenne Campus is a great resource for strategies about how to follow through on dietary plans. Did she ever get the message about vaginal treatments? Thanks [facility] MD

## 2014-03-05 NOTE — Telephone Encounter (Signed)
LM for patient to call back.  Please given message from Dr. Skeet Simmer.  Thanks [employer]

## 2014-05-05 ENCOUNTER — Encounter (HOSPITAL_COMMUNITY): Payer: Self-pay | Admitting: Emergency Medicine

## 2014-05-05 ENCOUNTER — Emergency Department (HOSPITAL_COMMUNITY): Payer: No Typology Code available for payment source

## 2014-05-05 ENCOUNTER — Emergency Department (HOSPITAL_COMMUNITY)
Admission: EM | Admit: 2014-05-05 | Discharge: 2014-05-05 | Disposition: A | Discharge status: Home / Self Care | Payer: No Typology Code available for payment source | Attending: Emergency Medicine | Admitting: Emergency Medicine

## 2014-05-05 DIAGNOSIS — S93609A Unspecified sprain of unspecified foot, initial encounter: Secondary | ICD-10-CM | POA: Insufficient documentation

## 2014-05-05 DIAGNOSIS — Y990 Civilian activity done for income or pay: Secondary | ICD-10-CM | POA: Insufficient documentation

## 2014-05-05 DIAGNOSIS — S93602A Unspecified sprain of left foot, initial encounter: Secondary | ICD-10-CM

## 2014-05-05 DIAGNOSIS — D649 Anemia, unspecified: Secondary | ICD-10-CM | POA: Insufficient documentation

## 2014-05-05 DIAGNOSIS — Z79899 Other long term (current) drug therapy: Secondary | ICD-10-CM | POA: Insufficient documentation

## 2014-05-05 DIAGNOSIS — Y9289 Other specified places as the place of occurrence of the external cause: Secondary | ICD-10-CM | POA: Insufficient documentation

## 2014-05-05 DIAGNOSIS — Z8669 Personal history of other diseases of the nervous system and sense organs: Secondary | ICD-10-CM | POA: Insufficient documentation

## 2014-05-05 DIAGNOSIS — Y93E5 Activity, floor mopping and cleaning: Secondary | ICD-10-CM | POA: Insufficient documentation

## 2014-05-05 DIAGNOSIS — E119 Type 2 diabetes mellitus without complications: Secondary | ICD-10-CM | POA: Insufficient documentation

## 2014-05-05 DIAGNOSIS — M129 Arthropathy, unspecified: Secondary | ICD-10-CM | POA: Insufficient documentation

## 2014-05-05 DIAGNOSIS — I10 Essential (primary) hypertension: Secondary | ICD-10-CM | POA: Insufficient documentation

## 2014-05-05 DIAGNOSIS — X500XXA Overexertion from strenuous movement or load, initial encounter: Secondary | ICD-10-CM | POA: Insufficient documentation

## 2014-05-05 MED ORDER — NAPROXEN 500 MG PO TABS: 500.0000 mg | ORAL_TABLET | Freq: Two times a day (BID) | ORAL | Status: DC

## 2014-05-05 MED ORDER — HYDROCODONE-ACETAMINOPHEN 5-325 MG PO TABS: 1.0000 | ORAL_TABLET | ORAL | Status: DC | PRN

## 2014-05-05 MED ORDER — OXYCODONE-ACETAMINOPHEN 5-325 MG PO TABS
1.0000 | ORAL_TABLET | Freq: Once | ORAL | Status: AC
  Administered 2014-05-05: 1 via ORAL
  Filled 2014-05-05: qty 1

## 2014-05-05 NOTE — ED Notes (Signed)
Pt states that she woke up from nap today and her L foot and ankle were swollen and painful on the lateral aspect. Alert and oriented. No trauma noted.

## 2014-05-05 NOTE — ED Provider Notes (Signed)
CSN: [id number]     Arrival date & time 05/05/14  1725 History   First MD Initiated Contact with Patient 05/05/14 1733     Chief Complaint  Patient presents with  . Foot Pain  . Ankle Pain     (Consider location/radiation/quality/duration/timing/severity/associated sxs/prior Treatment) HPI Veronica Simmons is a 63 y.o. female who presents to ED with complaint of left foot pain and swelling. 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 States she took a nap in the afternoon. States when woke up she noticed her foot and ankle was very painful and swollen. States she was not able to walk on it. States she did not try any treatment but came straight here. Denies fever, chills, malaise. Denies prior history of the same. No wounds. No other injuries. No hx of gout.   Past Medical History  Diagnosis Date  . Diabetes mellitus 2000    Never on insulin   . Hypertension 2000  . Arthritis   . Anemia 1970s    on iron   . ALLERGIC RHINITIS 02/19/2007    Qualifier: Diagnosis of  By: Lorne Skeens MD, VALENICA    . Cubital tunnel syndrome on right 10/31/2012    Secondary to old fracture. Evaluated by ortho. [facility] offered surgery but patient refused. Numbness and burning pain 4th and 5th digit.     History reviewed. No pertinent past surgical history. Family History  Problem Relation Age of Onset  . Early death Mother 68    shot   . Heart disease Father 58  . Diabetes Daughter   . Kidney disease Daughter     on dialysis   . Diabetes Maternal Aunt   . Cancer Paternal Uncle     Breast Cancer    History  Substance Use Topics  . Smoking status: Never Smoker   . Smokeless tobacco: Never Used  . Alcohol Use: No   OB History   Grav Para Term Preterm Abortions TAB SAB Ect Mult Living                 Review of Systems  Constitutional:  Negative for fever and chills.  Genitourinary: Negative for dysuria and flank pain.  Musculoskeletal: Positive for arthralgias and joint swelling. Negative for myalgias.  Skin: Negative for rash.  Neurological: Negative for dizziness, weakness and headaches.  All other systems reviewed and are negative.     Allergies  Levofloxacin  Home Medications   Prior to Admission medications   Medication Sig Start Date End Date Taking? Authorizing Provider  amLODipine (NORVASC) 10 MG tablet Take 1 tablet (10 mg total) by mouth daily. 11/07/13  Yes Andrena Mews, MD  benazepril (LOTENSIN) 40 MG tablet Take 1 tablet (40 mg total) by mouth daily. 11/07/13  Yes Andrena Mews, MD  Dapagliflozin Propanediol (FARXIGA) 5 MG TABS Take 5 mg by mouth daily.   Yes Historical Provider, MD  metFORMIN (GLUCOPHAGE) 1000 MG tablet Take 1 tablet (1,000 mg total) by mouth 2 (two) times daily with a meal. 11/07/13  Yes Andrena Mews, MD   BP 179/74  Pulse 88  Temp(Src) 97.8 F (36.6 C) (Oral)  SpO2 98% Physical Exam  Nursing note and vitals reviewed. Constitutional: She appears well-developed and well-nourished. No distress.  HENT:  Head: Normocephalic.  Eyes: Conjunctivae are normal.  Neck: Neck supple.  Musculoskeletal:  She exhibits no edema.  Swelling noted over left lateral malleolus of the ankle and left 4th and 5th metatarsals. Tender to palpation over lateral malleolus and 5th metatarsal. Pain with ROM of 4th and 5th toes. Full ROM of the ankle.   Neurological: She is alert.  Skin: Skin is warm and dry.  Psychiatric: She has a normal mood and affect. Her behavior is normal.    ED Course  Procedures (including critical care time) Labs Review Labs Reviewed - No data to display  Imaging Review Dg Ankle Complete Left  05/05/2014   CLINICAL DATA:  Pain and swelling secondary to a twisting injury.  EXAM: LEFT ANKLE COMPLETE - 3+ VIEW  COMPARISON:  Foot radiographs dated 09/08/2011  FINDINGS:  There is no evidence of fracture, dislocation, or joint effusion. There is no evidence of arthropathy or other focal bone abnormality. Slight soft tissue swelling around the ankle.  IMPRESSION: No acute osseous abnormality.   Electronically Signed   By: Rozetta Nunnery M.D.   On: 05/05/2014 18:42   Dg Foot Complete Left  05/05/2014   CLINICAL DATA:  Left foot and ankle pain and swelling secondary to a twisting injury.  EXAM: LEFT FOOT - COMPLETE 3+ VIEW  COMPARISON:  Radiographs dated 09/08/2011  FINDINGS: There is no fracture or dislocation. There are slight degenerative changes at the first metatarsophalangeal joint with bunion formation. There has been no significant change since the prior exam.  IMPRESSION: No acute abnormalities.   Electronically Signed   By: Rozetta Nunnery M.D.   On: 05/05/2014 19:00     EKG Interpretation None      MDM   Final diagnoses:  Sprain of foot, left    Pt with left ankle and foot and swelling after twisting it at work earlier. No signs of infection on the exam. Questioned gout, however pt has no hx of it. Pt is afebrile. X-rays obtained and are negative. Suspect a sprain. Home with ACE wrap, pain medications,  RICE therapy. Follow up.    Filed Vitals:   05/05/14 1731  BP: 179/74  Pulse: 88  Temp: 97.8 F (36.6 C)  TempSrc: Oral  SpO2: 98%       Renold Genta, PA-C 05/06/14 0049

## 2014-05-05 NOTE — ED Notes (Signed)
Patient transported to X-ray 

## 2014-05-05 NOTE — Discharge Instructions (Signed)
Take naprosyn for pain and inflammation. Take norco for severe pain. Keep leg elevated. ACE wrap for compression. Follow up with your primary care doctor.    Foot Sprain The muscles and cord like structures which attach muscle to bone (tendons) that surround the feet are made up of units. A foot sprain can occur at the weakest spot in any of these units. This condition is most often caused by injury to or overuse of the foot, as from playing contact sports, or aggravating a previous injury, or from poor conditioning, or obesity. SYMPTOMS  Pain with movement of the foot.  Tenderness and swelling at the injury site.  Loss of strength is present in moderate or severe sprains. THE THREE GRADES OR SEVERITY OF FOOT SPRAIN ARE:  Mild (Grade I): Slightly pulled muscle without tearing of muscle or tendon fibers or loss of strength.  Moderate (Grade II): Tearing of fibers in a muscle, tendon, or at the attachment to bone, with small decrease in strength.  Severe (Grade III): Rupture of the muscle-tendon-bone attachment, with separation of fibers. Severe sprain requires surgical repair. Often repeating (chronic) sprains are caused by overuse. Sudden (acute) sprains are caused by direct injury or over-use. DIAGNOSIS  Diagnosis of this condition is usually by your own observation. If problems continue, a caregiver may be required for further evaluation and treatment. X-rays may be required to make sure there are not breaks in the bones (fractures) present. Continued problems may require physical therapy for treatment. PREVENTION  Use strength and conditioning exercises appropriate for your sport.  Warm up properly prior to working out.  Use athletic shoes that are made for the sport you are participating in.  Allow adequate time for healing. Early return to activities makes repeat injury more likely, and can lead to an unstable arthritic foot that can result in prolonged disability. Mild sprains  generally heal in 3 to 10 days, with moderate and severe sprains taking 2 to 10 weeks. Your caregiver can help you determine the proper time required for healing. HOME CARE INSTRUCTIONS   Apply ice to the injury for 15-20 minutes, 03-04 times per day. Put the ice in a plastic bag and place a towel between the bag of ice and your skin.  An elastic wrap (like an Ace bandage) may be used to keep swelling down.  Keep foot above the level of the heart, or at least raised on a footstool, when swelling and pain are present.  Try to avoid use other than gentle range of motion while the foot is painful. Do not resume use until instructed by your caregiver. Then begin use gradually, not increasing use to the point of pain. If pain does develop, decrease use and continue the above measures, gradually increasing activities that do not cause discomfort, until you gradually achieve normal use.  Use crutches if and as instructed, and for the length of time instructed.  Keep injured foot and ankle wrapped between treatments.  Massage foot and ankle for comfort and to keep swelling down. Massage from the toes up towards the knee.  Only take over-the-counter or prescription medicines for pain, discomfort, or fever as directed by your caregiver. SEEK IMMEDIATE MEDICAL CARE IF:   Your pain and swelling increase, or pain is not controlled with medications.  You have loss of feeling in your foot or your foot turns cold or blue.  You develop new, unexplained symptoms, or an increase of the symptoms that brought you to your caregiver. MAKE SURE  YOU:   Understand these instructions.  Will watch your condition.  Will get help right away if you are not doing well or get worse. Document Released: 05/26/2002 Document Revised: 02/26/2012 Document Reviewed: 07/23/2008 [employer] Patient Information 2014 [place], [place].

## 2014-05-07 NOTE — ED Provider Notes (Signed)
Medical screening examination/treatment/procedure(s) were conducted as a shared visit with non-physician practitioner(s) and myself.  I personally evaluated the patient during the encounter.   EKG Interpretation None      Pt s/p twisting injury to left foot. +foot tenderness. No signif sts. Ankle stable. Distal pulses palp. No erythema. Skin intact.   Mirna Mires, MD 05/07/14 1124

## 2014-05-29 ENCOUNTER — Other Ambulatory Visit: Payer: Self-pay | Admitting: *Deleted

## 2014-05-29 MED ORDER — DAPAGLIFLOZIN PROPANEDIOL 5 MG PO TABS: 5.0000 mg | ORAL_TABLET | Freq: Every day | ORAL | Status: DC

## 2014-06-02 ENCOUNTER — Ambulatory Visit (INDEPENDENT_AMBULATORY_CARE_PROVIDER_SITE_OTHER): Payer: No Typology Code available for payment source | Admitting: Family Medicine

## 2014-06-02 ENCOUNTER — Encounter: Payer: Self-pay | Admitting: Family Medicine

## 2014-06-02 VITALS — BP 140/74 | HR 84 | Temp 97.9°F | Wt 217.0 lb

## 2014-06-02 DIAGNOSIS — A6000 Herpesviral infection of urogenital system, unspecified: Secondary | ICD-10-CM

## 2014-06-02 MED ORDER — VALACYCLOVIR HCL 1 G PO TABS: 1000.0000 mg | ORAL_TABLET | Freq: Two times a day (BID) | ORAL | Status: DC

## 2014-06-02 NOTE — Assessment & Plan Note (Signed)
A: recurrent outbreak. P: treat with valtrex

## 2014-06-02 NOTE — Progress Notes (Signed)
   Subjective:    Patient ID: Veronica Simmons, female    DOB: 1951-07-28, 63 y.o.   MRN: [id number] CC: rash  HPI 62 yo F presents for SD visit with complaint of rash in labia x one week. With associated itching and irritation. No new sex partners. History of cold sore and genital herpes.   Soc hx: non smoker  Review of Systems As per HPI     Objective:   Physical Exam BP 164/73  Pulse 84  Temp(Src) 97.9 F (36.6 C) (Oral)  Wt 217 lb (98.431 kg) General appearance: alert, cooperative and no distress Skin: labial ulceration, L inferior. Mild skin erythema and evidence of excoriation in labia.        Assessment & Plan:

## 2014-06-02 NOTE — Patient Instructions (Signed)
Veronica Simmons,  Thank you for coming in today. Recurrent genital herpes outbreak. Sent in valtrex 1000 mg twice daily for 10 days to your [employer].  F/u with Dr. Skeet Simmer if you have recurrence of failure to heal there is the possibility of suppressive therapy.  Dr. Adrian Blackwater

## 2014-06-04 ENCOUNTER — Telehealth: Payer: Self-pay | Admitting: Family Medicine

## 2014-06-04 MED ORDER — ACYCLOVIR 400 MG PO TABS: 400.0000 mg | ORAL_TABLET | Freq: Three times a day (TID) | ORAL | Status: DC

## 2014-06-04 NOTE — Telephone Encounter (Signed)
Sent in zovirax. Not sure if it is cheaper. Patient should call ahead.

## 2014-06-04 NOTE — Telephone Encounter (Signed)
Need a different medication for vaginal irritation called in that's cheaper.  Cannot afford the $40 charge.

## 2014-06-05 NOTE — Telephone Encounter (Signed)
LM for patient regarding new rx.  Advised her to call back if this doesn't work and is no cheaper. Daeshawn Redmann,CMA

## 2014-06-08 ENCOUNTER — Telehealth: Payer: Self-pay | Admitting: Family Medicine

## 2014-06-10 NOTE — Telephone Encounter (Signed)
Called Pt about DM care. When Pt calls back please schedule appointment to check LDL and BP.

## 2014-06-15 ENCOUNTER — Telehealth: Payer: Self-pay | Admitting: Family Medicine

## 2014-06-15 NOTE — Telephone Encounter (Signed)
Pt called and said that the medication that Dr. Adrian Blackwater prescribed her Zovirax is not working and would like Cipio called in instead. jw

## 2014-06-15 NOTE — Telephone Encounter (Signed)
LM for patient to call back. Veronica Simmons,CMA  

## 2014-06-15 NOTE — Telephone Encounter (Signed)
Cipio is not a medication. Please call patient to clarify request.  The treatment options for HSV are acyclovir, valacyclovir, watchful waiting.  May offer f/u with PCP if lesions not healing or worsening.

## 2014-06-16 ENCOUNTER — Telehealth: Payer: Self-pay | Admitting: Family Medicine

## 2014-06-16 DIAGNOSIS — A6000 Herpesviral infection of urogenital system, unspecified: Secondary | ICD-10-CM

## 2014-06-16 MED ORDER — ACYCLOVIR 400 MG PO TABS: 400.0000 mg | ORAL_TABLET | Freq: Three times a day (TID) | ORAL | Status: DC

## 2014-06-16 NOTE — Telephone Encounter (Signed)
Pt called and would like the same medication that she was given to try again, maybe she just needs to use it more. jw

## 2014-06-21 ENCOUNTER — Other Ambulatory Visit: Payer: Self-pay | Admitting: Family Medicine

## 2014-07-07 ENCOUNTER — Ambulatory Visit: Payer: Self-pay | Admitting: Family Medicine

## 2014-08-10 ENCOUNTER — Ambulatory Visit: Payer: Self-pay

## 2014-09-13 ENCOUNTER — Other Ambulatory Visit: Payer: Self-pay | Admitting: Family Medicine

## 2014-09-29 ENCOUNTER — Other Ambulatory Visit: Payer: Self-pay | Admitting: Family Medicine

## 2014-09-30 ENCOUNTER — Ambulatory Visit: Payer: Self-pay | Admitting: Family Medicine

## 2014-10-02 ENCOUNTER — Ambulatory Visit (INDEPENDENT_AMBULATORY_CARE_PROVIDER_SITE_OTHER): Payer: Self-pay | Admitting: Family Medicine

## 2014-10-02 ENCOUNTER — Encounter: Payer: Self-pay | Admitting: Family Medicine

## 2014-10-02 VITALS — BP 187/69 | HR 80 | Temp 98.2°F | Wt 217.0 lb

## 2014-10-02 DIAGNOSIS — B373 Candidiasis of vulva and vagina: Secondary | ICD-10-CM

## 2014-10-02 DIAGNOSIS — G5621 Lesion of ulnar nerve, right upper limb: Secondary | ICD-10-CM

## 2014-10-02 DIAGNOSIS — B3731 Acute candidiasis of vulva and vagina: Secondary | ICD-10-CM

## 2014-10-02 MED ORDER — FLUCONAZOLE 150 MG PO TABS: 150.0000 mg | ORAL_TABLET | Freq: Once | ORAL | Status: DC

## 2014-10-02 MED ORDER — GABAPENTIN 100 MG PO CAPS: 100.0000 mg | ORAL_CAPSULE | Freq: Three times a day (TID) | ORAL | Status: DC

## 2014-10-02 NOTE — Patient Instructions (Signed)
For your hand, I am prescribing gabapentin. You can take this as needed to help calm the nerves.  For your discharge, I am prescribing diflucan with refills since this has been an ongoing issue for you.

## 2014-10-14 NOTE — Assessment & Plan Note (Signed)
Has splint which helps some, still doesn't want surgery, referred for PT at last visit - trial of gabapentin

## 2014-10-14 NOTE — Assessment & Plan Note (Addendum)
Frequent yeast infections, resolve quickly with diflucan, pt requesting not to have to come in every time, predisposed by diabetes - wrote for diflucan with refills for convenience, pt reliable and has had enough to know when she has an infection

## 2014-10-14 NOTE — Progress Notes (Signed)
   Subjective:    Patient ID: Veronica Simmons, female    DOB: 1951-06-05, 63 y.o.   MRN: [id number]  Vaginal Discharge The patient's primary symptoms include a vaginal discharge.  Hand Pain    Pt presents for right hand and elbow pain. She reports pain is worst in fingers and they often get stiff. The pain is tingling/burning in nature. It has been happening for many years but is worse in cold weather so it has gotten worse lately. She has a splint that helps but also prevents her from using her hand so she can't wear it that much.   Review of Systems  Genitourinary: Positive for vaginal discharge.   See HPI    Objective:   Physical Exam  Nursing note and vitals reviewed. Constitutional: She is oriented to person, place, and time. She appears well-developed and well-nourished. No distress.  HENT:  Head: Normocephalic and atraumatic.  Eyes: Conjunctivae are normal. Right eye exhibits no discharge. Left eye exhibits no discharge. No scleral icterus.  Cardiovascular: Normal rate.   Pulmonary/Chest: Effort normal.  Abdominal: She exhibits no distension.  Musculoskeletal:       Right elbow: She exhibits normal range of motion, no swelling, no deformity and no laceration. Tenderness found. Medial epicondyle tenderness noted.       Right wrist: Normal.       Right forearm: Normal. She exhibits no tenderness, no bony tenderness, no swelling, no edema, no deformity and no laceration.       Right hand: She exhibits tenderness. She exhibits normal range of motion, no bony tenderness, normal capillary refill, no deformity, no laceration and no swelling. Decreased sensation noted. Decreased strength noted.  Neurological: She is alert and oriented to person, place, and time. She displays normal reflexes.  Skin: Skin is warm and dry. No rash noted. She is not diaphoretic. No erythema.  Psychiatric: She has a normal mood and affect. Her behavior is normal.          Assessment & Plan:

## 2014-10-19 ENCOUNTER — Other Ambulatory Visit: Payer: Self-pay | Admitting: Family Medicine

## 2014-11-16 ENCOUNTER — Other Ambulatory Visit: Payer: Self-pay | Admitting: Family Medicine

## 2014-11-24 ENCOUNTER — Other Ambulatory Visit: Payer: Self-pay | Admitting: Family Medicine

## 2014-12-07 ENCOUNTER — Ambulatory Visit: Payer: Self-pay

## 2014-12-17 ENCOUNTER — Other Ambulatory Visit: Payer: Self-pay | Admitting: Family Medicine

## 2014-12-21 ENCOUNTER — Ambulatory Visit (INDEPENDENT_AMBULATORY_CARE_PROVIDER_SITE_OTHER): Payer: Self-pay | Admitting: Family Medicine

## 2014-12-21 ENCOUNTER — Encounter: Payer: Self-pay | Admitting: Family Medicine

## 2014-12-21 VITALS — BP 161/77 | HR 99 | Temp 97.9°F | Ht 66.0 in | Wt 222.0 lb

## 2014-12-21 DIAGNOSIS — D649 Anemia, unspecified: Secondary | ICD-10-CM

## 2014-12-21 DIAGNOSIS — G4700 Insomnia, unspecified: Secondary | ICD-10-CM

## 2014-12-21 DIAGNOSIS — G5621 Lesion of ulnar nerve, right upper limb: Secondary | ICD-10-CM

## 2014-12-21 DIAGNOSIS — E1140 Type 2 diabetes mellitus with diabetic neuropathy, unspecified: Secondary | ICD-10-CM

## 2014-12-21 LAB — COMPREHENSIVE METABOLIC PANEL
ALT: 11 U/L (ref 0–35)
AST: 15 U/L (ref 0–37)
Albumin: 3.8 g/dL (ref 3.5–5.2)
Alkaline Phosphatase: 52 U/L (ref 39–117)
BILIRUBIN TOTAL: 0.4 mg/dL (ref 0.2–1.2)
BUN: 14 mg/dL (ref 6–23)
CO2: 26 mEq/L (ref 19–32)
Calcium: 9.5 mg/dL (ref 8.4–10.5)
Chloride: 100 mEq/L (ref 96–112)
Creat: 0.7 mg/dL (ref 0.50–1.10)
GLUCOSE: 259 mg/dL — AB (ref 70–99)
Potassium: 4.2 mEq/L (ref 3.5–5.3)
SODIUM: 135 meq/L (ref 135–145)
Total Protein: 6.8 g/dL (ref 6.0–8.3)

## 2014-12-21 LAB — CBC
HCT: 38.9 % (ref 36.0–46.0)
HEMOGLOBIN: 12.3 g/dL (ref 12.0–15.0)
MCH: 28.5 pg (ref 26.0–34.0)
MCHC: 31.6 g/dL (ref 30.0–36.0)
MCV: 90.3 fL (ref 78.0–100.0)
MPV: 8.7 fL (ref 8.6–12.4)
Platelets: 343 10*3/uL (ref 150–400)
RBC: 4.31 MIL/uL (ref 3.87–5.11)
RDW: 13.4 % (ref 11.5–15.5)
WBC: 6 10*3/uL (ref 4.0–10.5)

## 2014-12-21 MED ORDER — METFORMIN HCL 1000 MG PO TABS: ORAL_TABLET | ORAL | Status: DC

## 2014-12-21 MED ORDER — TRAZODONE HCL 50 MG PO TABS: 25.0000 mg | ORAL_TABLET | Freq: Every evening | ORAL | Status: DC | PRN

## 2014-12-21 NOTE — Telephone Encounter (Signed)
Pt has an appt today to see Dr. Sherril Cong and will mention this at that time. Dionis Autry,CMA

## 2014-12-21 NOTE — Patient Instructions (Signed)
Insomnia Insomnia is frequent trouble falling and/or staying asleep. Insomnia can be a long term problem or a short term problem. Both are common. Insomnia can be a short term problem when the wakefulness is related to a certain stress or worry. Long term insomnia is often related to ongoing stress during waking hours and/or poor sleeping habits. Overtime, sleep deprivation itself can make the problem worse. Every little thing feels more severe because you are overtired and your ability to cope is decreased. CAUSES   Stress, anxiety, and depression.  Poor sleeping habits.  Distractions such as TV in the bedroom.  Naps close to bedtime.  Engaging in emotionally charged conversations before bed.  Technical reading before sleep.  Alcohol and other sedatives. They may make the problem worse. They can hurt normal sleep patterns and normal dream activity.  Stimulants such as caffeine for several hours prior to bedtime.  Pain syndromes and shortness of breath can cause insomnia.  Exercise late at night.  Changing time zones may cause sleeping problems (jet lag). It is sometimes helpful to have someone observe your sleeping patterns. They should look for periods of not breathing during the night (sleep apnea). They should also look to see how long those periods last. If you live alone or observers are uncertain, you can also be observed at a sleep clinic where your sleep patterns will be professionally monitored. Sleep apnea requires a checkup and treatment. Give your caregivers your medical history. Give your caregivers observations your family has made about your sleep.  SYMPTOMS   Not feeling rested in the morning.  Anxiety and restlessness at bedtime.  Difficulty falling and staying asleep. TREATMENT   Your caregiver may prescribe treatment for an underlying medical disorders. Your caregiver can give advice or help if you are using alcohol or other drugs for self-medication. Treatment  of underlying problems will usually eliminate insomnia problems.  Medications can be prescribed for short time use. They are generally not recommended for lengthy use.  Over-the-counter sleep medicines are not recommended for lengthy use. They can be habit forming.  You can promote easier sleeping by making lifestyle changes such as:  Using relaxation techniques that help with breathing and reduce muscle tension.  Exercising earlier in the day.  Changing your diet and the time of your last meal. No night time snacks.  Establish a regular time to go to bed.  Counseling can help with stressful problems and worry.  Soothing music and white noise may be helpful if there are background noises you cannot remove.  Stop tedious detailed work at least one hour before bedtime. HOME CARE INSTRUCTIONS   Keep a diary. Inform your caregiver about your progress. This includes any medication side effects. See your caregiver regularly. Take note of:  Times when you are asleep.  Times when you are awake during the night.  The quality of your sleep.  How you feel the next day. This information will help your caregiver care for you.  Get out of bed if you are still awake after 15 minutes. Read or do some quiet activity. Keep the lights down. Wait until you feel sleepy and go back to bed.  Keep regular sleeping and waking hours. Avoid naps.  Exercise regularly.  Avoid distractions at bedtime. Distractions include watching television or engaging in any intense or detailed activity like attempting to balance the household checkbook.  Develop a bedtime ritual. Keep a familiar routine of bathing, brushing your teeth, climbing into bed at the same   time each night, listening to soothing music. Routines increase the success of falling to sleep faster.  Use relaxation techniques. This can be using breathing and muscle tension release routines. It can also include visualizing peaceful scenes. You can  also help control troubling or intruding thoughts by keeping your mind occupied with boring or repetitive thoughts like the old concept of counting sheep. You can make it more creative like imagining planting one beautiful flower after another in your backyard garden.  During your day, work to eliminate stress. When this is not possible use some of the previous suggestions to help reduce the anxiety that accompanies stressful situations. MAKE SURE YOU:   Understand these instructions.  Will watch your condition.  Will get help right away if you are not doing well or get worse. Document Released: 12/01/2000 Document Revised: 02/26/2012 Document Reviewed: 01/01/2008 ExitCare Patient Information 2015 [employer]. This information is not intended to replace advice given to you by your health care provider. Make sure you discuss any questions you have with your health care provider.  

## 2014-12-21 NOTE — Progress Notes (Signed)
   Subjective:    Patient ID: Veronica Simmons, female    DOB: Dec 09, 1951, 64 y.o.   MRN: [id number]  HPI Pt presents for f/u of hand pain. Reports her right hands hurts worse than ever recently. She wants to see another specialist for a second opinion and to see if there are any nonsurgical options.  She is also complaining of difficulty sleeping and feeling tired all the time. She can't fall asleep and when she does she wakes up frequently throughout the night. She also feels tired in the same way that she did in the past when anemic so she is not sure if it is that or the lack of sleep. Denies mood symptoms. Occasional dizziness/lightheadedness on standing.   Review of Systems See HPI    Objective:   Physical Exam  Constitutional: She is oriented to person, place, and time. She appears well-developed and well-nourished. No distress.  HENT:  Head: Normocephalic and atraumatic.  Eyes: Conjunctivae are normal. Right eye exhibits no discharge. Left eye exhibits no discharge. No scleral icterus.  Cardiovascular: Normal rate.   Pulmonary/Chest: Effort normal.  Abdominal: She exhibits no distension.  Musculoskeletal:  Right hand with contractures and ulnar deviation  Neurological: She is alert and oriented to person, place, and time.  Skin: Skin is warm and dry. She is not diaphoretic.  Psychiatric: She has a normal mood and affect. Her behavior is normal.  Nursing note and vitals reviewed.         Assessment & Plan:

## 2014-12-21 NOTE — Telephone Encounter (Signed)
Note to nursing staff - please call patient and inform her that one month supply of metformin to be supplied however needs appointment to discuss diabetes, please schedule

## 2014-12-21 NOTE — Assessment & Plan Note (Signed)
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

## 2014-12-21 NOTE — Assessment & Plan Note (Addendum)
Long history of trouble falling and staying asleep, ambien worked ok in past but still woke up frequently, no mood disturbance - discussed sleep hygiene recs - trial of trazodone prn

## 2014-12-21 NOTE — Assessment & Plan Note (Signed)
Reports on iron for anemia in the past which presented with fatigue, feels similarly now, not taking iron currently - will check CBC and CMP

## 2015-01-25 ENCOUNTER — Ambulatory Visit: Payer: Self-pay | Admitting: Family Medicine

## 2015-02-09 ENCOUNTER — Telehealth: Payer: Self-pay | Admitting: Student

## 2015-02-09 NOTE — Telephone Encounter (Signed)
Pt is asking about her referral to [facility]....

## 2015-02-09 NOTE — Telephone Encounter (Signed)
Spoke with patient and informed her that it could be month or so before she hears anything about an appt.  [facility] has to contact the specialist in the area to see if they are willing to take this discount.  She voiced understanding and is aware that she would hear something before [place]. Keslyn Teater,CMA

## 2015-02-10 ENCOUNTER — Other Ambulatory Visit: Payer: Self-pay | Admitting: Family Medicine

## 2015-03-29 ENCOUNTER — Ambulatory Visit: Payer: Self-pay | Admitting: Student

## 2015-04-19 ENCOUNTER — Encounter: Payer: Self-pay | Admitting: Student

## 2015-04-19 ENCOUNTER — Ambulatory Visit (INDEPENDENT_AMBULATORY_CARE_PROVIDER_SITE_OTHER): Payer: Self-pay | Admitting: Student

## 2015-04-19 VITALS — BP 159/72 | HR 88 | Temp 98.2°F | Ht 66.0 in | Wt 213.8 lb

## 2015-04-19 DIAGNOSIS — E1140 Type 2 diabetes mellitus with diabetic neuropathy, unspecified: Secondary | ICD-10-CM

## 2015-04-19 DIAGNOSIS — I10 Essential (primary) hypertension: Secondary | ICD-10-CM

## 2015-04-19 LAB — POCT GLYCOSYLATED HEMOGLOBIN (HGB A1C): HEMOGLOBIN A1C: 8.1

## 2015-04-19 MED ORDER — ASPIRIN EC 81 MG PO TBEC: 81.0000 mg | DELAYED_RELEASE_TABLET | Freq: Every day | ORAL | Status: DC

## 2015-04-19 NOTE — Assessment & Plan Note (Addendum)
A1c today Consider taking off Wilder Glade if cost is too great with recent los of insurance Healthy diet and exercise reviewed Foot exam today Ophtho exam referral needed. Will place at next visit

## 2015-04-19 NOTE — Assessment & Plan Note (Signed)
BP persistently elevated to 158/72 then 151/78 on repeat. Currently on max dose of Norvasc and Ace. Consider adding HCTZ if persistent elevated BP at future visit

## 2015-04-19 NOTE — Patient Instructions (Addendum)
Follow up in 1 month for blood pressure check Please exercise at least 30 mins per day  Please maintain a healthy diet rich in fruits and vegetables

## 2015-04-19 NOTE — Progress Notes (Signed)
   Subjective:    Patient ID: Veronica Simmons, female    DOB: 09/16/1951, 64 y.o.   MRN: [id number]   CC: diabetes check  HPI  64 y/o with T2DM, HTN who presents for DM follow up  DM - denies neuralgia, changes in vision. Has been taking medicines regularly but feels she has been stressed and not making good food choices lately. She lost her job recently. Denies SI/HI. Does report frequent yeast infections for which she takes diflucan which has been ordered in the past with refills      Review of Systems   See HPI for ROS. All other systems reviewed and are negative.  Past medical history, surgical, family, and social history reviewed and updated in the EMR as appropriate.  Past Medical History  Diagnosis Date  . Diabetes mellitus 2000    Never on insulin   . Hypertension 2000  . Arthritis   . Anemia 1970s    on iron   . ALLERGIC RHINITIS 02/19/2007    Qualifier: Diagnosis of  By: Lorne Skeens MD, VALENICA    . Cubital tunnel syndrome on right 10/31/2012    Secondary to old fracture. Evaluated by ortho. [facility] offered surgery but patient refused. Numbness and burning pain 4th and 5th digit.     @PSG @ OB History    No data available     History   Social History  . Marital Status: Divorced    Spouse Name: N/A  . Number of Children: 4  . Years of Education: Masters Cu   Occupational History  . [employer]    Social History Main Topics  . Smoking status: Never Smoker   . Smokeless tobacco: Never Used  . Alcohol Use: No  . Drug Use: No  . Sexual Activity: No   Other Topics Concern  . Not on file   Social History Narrative   Lives with eldest daughter (on dialysis) serves as her primary caretaker.           Objective:  BP 159/72 mmHg  Pulse 88  Temp(Src) 98.2 F (36.8 C) (Oral)  Ht 5\' 6"  (1.676 m)  Wt 213 lb 12.8 oz (96.979 kg)  BMI 34.52 kg/m2 Vitals and nursing note reviewed  General: NAD Cardiac: RRR, normal  heart sounds, no murmurs. Respiratory: CTAB, normal effort Abdomen: soft, nontender, nondistended, no hepatic or splenomegaly. Bowel sounds present Extremities: no edema or cyanosis. WWP. Right hand with contractures and ulnar deviation  Skin: warm and dry, no rashes noted Neuro: alert and oriented, no focal deficits  Foot exam: + b/l DP pulses, no lacerations or trauma, well kept, sensation intact bilaterally to sharp and soft touch   Assessment & Plan:  See Problem List      Ifeoluwa Beller A. Lincoln [name] MD, [facility] Resident PGY-1 Pager [phone]

## 2015-04-21 NOTE — Progress Notes (Signed)
I was preceptor for this office visit.  

## 2015-04-24 ENCOUNTER — Other Ambulatory Visit: Payer: Self-pay | Admitting: Family Medicine

## 2015-04-26 ENCOUNTER — Telehealth: Payer: Self-pay | Admitting: Student

## 2015-04-26 NOTE — Telephone Encounter (Addendum)
Pt called and would like to change her medication to something else. She is currently on Metformin and would like to change it to a different medication. Please call to discuss. jw  Pt called and she will follow in clinic in 1-2 weeks to discuss further,. Will not change medication at this time. Testing strips called in and she will check and record her blood sugars until her visit  -alyssa haney

## 2015-04-26 NOTE — Telephone Encounter (Signed)
Refill request. Will forward to PCP for review. Mohsin Crum, CMA. 

## 2015-04-28 NOTE — Telephone Encounter (Signed)
Pt called back to check the status of her request to change Metformin to something else. Also she needs some strips called in. jw

## 2015-04-29 ENCOUNTER — Other Ambulatory Visit: Payer: Self-pay | Admitting: Student

## 2015-04-29 DIAGNOSIS — E119 Type 2 diabetes mellitus without complications: Secondary | ICD-10-CM

## 2015-04-29 DIAGNOSIS — E1140 Type 2 diabetes mellitus with diabetic neuropathy, unspecified: Secondary | ICD-10-CM

## 2015-04-29 NOTE — Progress Notes (Signed)
Pt called and after two pt identifiers were confirmed, asked for refill her dm test strips which was done.She additionally asked if she could be switched to [place] from metformin at this time. Her last A1c was 8.1 on 5/2 and she feels she is bing incompletely controled with the metformin. She was again counseled on diet and exercise and to follow in clinic to revisit her medication regimen. She will schedule an appointment in 1-2 weeks  Mercie Balsley A. Lincoln [name] MD, [facility] Resident PGY-1 Pager [phone]

## 2015-04-29 NOTE — Telephone Encounter (Signed)
Pt is calling back and could like to change her medication to [place]. She would like to have a response asap, as she is out of her current medication. Please advise. Thank you, [name], ASA

## 2015-05-19 ENCOUNTER — Other Ambulatory Visit: Payer: Self-pay | Admitting: Student

## 2015-05-19 NOTE — Telephone Encounter (Signed)
Wilder Glade Rx refilled  Jennelle Pinkstaff A. Lincoln [name] MD, [facility] Resident PGY-1

## 2015-06-02 ENCOUNTER — Telehealth: Payer: Self-pay | Admitting: Student

## 2015-06-02 NOTE — Telephone Encounter (Signed)
Patient is calling because she needs test strips to check her blood sugar and she also would like her diabetic medication changed because it gives her yeast infections. She does not know what the medication is called. Thank you, [name], ASA

## 2015-06-02 NOTE — Telephone Encounter (Signed)
Will forward to PCP for review. Khalel Alms, CMA. 

## 2015-06-04 NOTE — Telephone Encounter (Signed)
Please call pt to make an appointment and we can discuss diabetic regimen as well as blood sugar testing. She is not on insulin and we do not typically check blood sugars at home if not on insulin  Thanks!  -Shaun Zuccaro

## 2015-06-07 NOTE — Telephone Encounter (Signed)
LM for patient to call back.  Please assist her in making an appt with someone for this concern. Wrenn Willcox,CMA

## 2015-06-15 ENCOUNTER — Telehealth: Payer: Self-pay | Admitting: Student

## 2015-06-15 DIAGNOSIS — G5601 Carpal tunnel syndrome, right upper limb: Secondary | ICD-10-CM

## 2015-06-15 NOTE — Telephone Encounter (Signed)
Pt called because she now has Medicaid and would like to have a referral to a hand specialist. jw

## 2015-06-15 NOTE — Telephone Encounter (Signed)
Will forward to MD. Jazmin Hartsell,CMA  

## 2015-06-16 NOTE — Telephone Encounter (Signed)
Amb referral to hand surgery sent. Please inform pt and help with appointment  Thank you!  Sylvanus Telford

## 2015-06-22 ENCOUNTER — Telehealth: Payer: Self-pay | Admitting: Student

## 2015-06-22 DIAGNOSIS — E1140 Type 2 diabetes mellitus with diabetic neuropathy, unspecified: Secondary | ICD-10-CM

## 2015-06-22 NOTE — Telephone Encounter (Signed)
Return call to patient regarding blood sugars.  Pt stated she was given samples of Janumet from a doctor in [place].  Pt felt that the Janumet help with lowering her blood sugars under 200.  Pt stated her blood sugars would usually run in the 300-400s.  Highest blood sugar in the past two weeks has been 214.  Pt is requesting a Rx be sent to [facility] off of [address] for the [place].  Will forward to PCP.  Derl Barrow, RN

## 2015-06-22 NOTE — Telephone Encounter (Signed)
Left voice message for patient to return nurse call regarding her blood sugars.  Derl Barrow, RN

## 2015-06-22 NOTE — Telephone Encounter (Signed)
Would like to speak to a nurse regarding her blood sugar and medication. Thank you, [name], ASA

## 2015-06-23 MED ORDER — METFORMIN HCL 1000 MG PO TABS: ORAL_TABLET | ORAL | Status: DC

## 2015-06-23 MED ORDER — DAPAGLIFLOZIN PROPANEDIOL 5 MG PO TABS: 5.0000 mg | ORAL_TABLET | Freq: Every day | ORAL | Status: DC

## 2015-06-23 NOTE — Telephone Encounter (Signed)
Patient calls again, patient is completely out of meds and needs something sent in today. Patient has an appt with Dr. Lincoln [name] on 7/11 but needs meds to last until the appt. Please advise.

## 2015-06-23 NOTE — Telephone Encounter (Signed)
Will forward to PCP to review. Harlem Thresher, CMA.

## 2015-06-23 NOTE — Telephone Encounter (Signed)
Farxiga and Metformin refilled. As she had 12 refills on her metformin and it was prescribed in 12/2014 she cannot be out of it. Will discuss starting Janumet at her visit next week Please inform the patient  Thank you  Shivansh Hardaway A. Lincoln [name] MD, [facility] Resident PGY-1 Pager [phone]

## 2015-06-28 ENCOUNTER — Ambulatory Visit (INDEPENDENT_AMBULATORY_CARE_PROVIDER_SITE_OTHER): Payer: Medicaid Other | Admitting: Student

## 2015-06-28 ENCOUNTER — Encounter: Payer: Self-pay | Admitting: Student

## 2015-06-28 VITALS — BP 164/84 | HR 101 | Temp 99.3°F | Wt 223.0 lb

## 2015-06-28 DIAGNOSIS — Z23 Encounter for immunization: Secondary | ICD-10-CM | POA: Diagnosis not present

## 2015-06-28 DIAGNOSIS — E1143 Type 2 diabetes mellitus with diabetic autonomic (poly)neuropathy: Secondary | ICD-10-CM | POA: Diagnosis not present

## 2015-06-28 DIAGNOSIS — E1140 Type 2 diabetes mellitus with diabetic neuropathy, unspecified: Secondary | ICD-10-CM | POA: Diagnosis not present

## 2015-06-28 DIAGNOSIS — Z1239 Encounter for other screening for malignant neoplasm of breast: Secondary | ICD-10-CM

## 2015-06-28 DIAGNOSIS — I10 Essential (primary) hypertension: Secondary | ICD-10-CM

## 2015-06-28 DIAGNOSIS — G5621 Lesion of ulnar nerve, right upper limb: Secondary | ICD-10-CM | POA: Diagnosis present

## 2015-06-28 DIAGNOSIS — Z0000 Encounter for general adult medical examination without abnormal findings: Secondary | ICD-10-CM | POA: Diagnosis not present

## 2015-06-28 MED ORDER — ZOSTER VACCINE LIVE 19400 UNT/0.65ML ~~LOC~~ SOLR: 0.6500 mL | Freq: Once | SUBCUTANEOUS | Status: DC

## 2015-06-28 MED ORDER — SITAGLIPTIN PHOS-METFORMIN HCL 50-500 MG PO TABS: 1.0000 | ORAL_TABLET | Freq: Two times a day (BID) | ORAL | Status: DC

## 2015-06-28 NOTE — Assessment & Plan Note (Signed)
Continued numbness and tingling of right hand ulnar region 2/2 carpal tunnel vs remote hand injury - Will refer to hand surgery for eval - Refer to PT - encouraged to take neurtontin regularly for pain

## 2015-06-28 NOTE — Assessment & Plan Note (Signed)
Desire for med change to janumet in setting of increased HgbA1c - Will switch to janumet - encouraged maintain health diet  - Continue to encourage exercise as part of a healthy lifestyle - Encouraged to take neurontin regularly for neuropathy  - Optho visit encouraged

## 2015-06-28 NOTE — Assessment & Plan Note (Signed)
BP elevated in clinic today. But pt states she does not wish to tale more pills and is maxed on Norvasc and Lotensin - Discuss antihypertensive regimen at next visit

## 2015-06-28 NOTE — Patient Instructions (Addendum)
Return in 1 month for diabetes check Please take Janumet daily Please decrease carbohydrates and sweets Please follow with hand surgery and Physical therapy Please schedule mammogram. colonoscopy and get the Zostavax from your Pharmacy

## 2015-06-28 NOTE — Progress Notes (Signed)
   Subjective:    Patient ID: Nile Dear, female    DOB: 1951/12/04, 64 y.o.   MRN: [id number]   CC: DM  Med change and concern for right carpal tunnel  HPI  64 y/o with T2DM, HTN and right carpel tunnel   DM -Desires to switch current metformin and farxiga to janument. She recently tried it at a different practice in [facility] and found her blood sugars improved dramatically from her usual 300-400 to 100-200. Reported she feels as if she does not have the self control to regulate her diet.  - Intermittent B/l foot numbness and pain, occasionally takes neurtontin  Right carpel tunnel -Reports right pinky finger contracture for the last several years with numbness and tingling -Symptoms improved with ace bandage wrap she bought from [place] that keeps that finger extended - Has been evaluated by hand surgeon in the past who recommended surgery but she did not want it at the time - Desires a TENS device which a friend suggested - has not been taking     Review of Systems   See HPI for ROS.   Past medical history, surgical, family, and social history reviewed and updated in the EMR as appropriate.  Past Medical History  Diagnosis Date  . Diabetes mellitus 2000    Never on insulin   . Hypertension 2000  . Arthritis   . Anemia 1970s    on iron   . ALLERGIC RHINITIS 02/19/2007    Qualifier: Diagnosis of  By: Lorne Skeens MD, VALENICA    . Cubital tunnel syndrome on right 10/31/2012    Secondary to old fracture. Evaluated by ortho. [facility] offered surgery but patient refused. Numbness and burning pain 4th and 5th digit.      History   Social History  . Marital Status: Divorced    Spouse Name: N/A  . Number of Children: 4  . Years of Education: Masters Cu   Occupational History  . [employer]    Social History Main Topics  . Smoking status: Never Smoker   . Smokeless tobacco: Never Used  . Alcohol Use: No  . Drug Use: No  .  Sexual Activity: No   Other Topics Concern  . Not on file   Social History Narrative   Lives with eldest daughter (on dialysis) serves as her primary caretaker.           Objective:  BP 164/84 mmHg  Pulse 101  Temp(Src) 99.3 F (37.4 C)  Wt 223 lb (101.152 kg) Vitals and nursing note reviewed  General: NAD Cardiac: RRR, normal heart sounds, no murmurs. Respiratory: CTAB, normal effort Abdomen: soft, nontender, nondistended, no hepatic or splenomegaly. Bowel sounds present Extremities: no edema or cyanosis. WWP. 5/5 UE strength and ROM, right 5th digit contracture See quality metric for Foot exam Skin: warm and dry, no rashes noted Neuro: alert and oriented, no focal deficits   Assessment & Plan:  See Problem List      Alyssa A. Lincoln [name] MD, [facility] Resident PGY-1 Pager [phone]

## 2015-06-29 ENCOUNTER — Telehealth: Payer: Self-pay | Admitting: *Deleted

## 2015-06-29 NOTE — Telephone Encounter (Signed)
Prior Authorization received from [employer] for [employer]. Formulary and PA form placed in provider box for completion. Derl Barrow, RN

## 2015-06-30 ENCOUNTER — Telehealth: Payer: Self-pay | Admitting: Student

## 2015-06-30 NOTE — Telephone Encounter (Signed)
Received PA approval for Janumet 50-1000 mg via [employer].  Med approved for 06/30/15 - 06/24/16.  [facility] informed.  PA approval number [id number]. Derl Barrow, RN

## 2015-06-30 NOTE — Telephone Encounter (Signed)
Pharmacist from [facility] called stating they need a prior authorization for the Janumet 50-500 mg.  The original PA was sent for Janumet 50-1000 mg.  The PA was approved for the 50-1000 mg.  The pharmacist is requesting to change to the 50-1000 mg and the patient is ok with change.  Please send new Rx for the Janumet 50-1000 mg since medicaid would cover the cost.  Derl Barrow, RN

## 2015-06-30 NOTE — Telephone Encounter (Signed)
Will forward to MD. Jazmin Hartsell,CMA  

## 2015-06-30 NOTE — Telephone Encounter (Signed)
Pt called and needs a referral to see an eye doctor. She has diabetes and would like to see Dr. Shirley Muscat at [facility] [phone]. jw

## 2015-07-01 ENCOUNTER — Other Ambulatory Visit: Payer: Self-pay | Admitting: Student

## 2015-07-01 DIAGNOSIS — E1140 Type 2 diabetes mellitus with diabetic neuropathy, unspecified: Secondary | ICD-10-CM

## 2015-07-01 DIAGNOSIS — E1143 Type 2 diabetes mellitus with diabetic autonomic (poly)neuropathy: Secondary | ICD-10-CM

## 2015-07-01 MED ORDER — SITAGLIPTIN PHOS-METFORMIN HCL 50-1000 MG PO TABS: 1.0000 | ORAL_TABLET | Freq: Two times a day (BID) | ORAL | Status: DC

## 2015-07-01 MED ORDER — SITAGLIPTIN PHOS-METFORMIN HCL 50-500 MG PO TABS: 1.0000 | ORAL_TABLET | Freq: Two times a day (BID) | ORAL | Status: DC

## 2015-07-01 NOTE — Progress Notes (Signed)
Just add her requested provider in the notes section of the referral. Jazmin Hartsell,CMA

## 2015-07-01 NOTE — Addendum Note (Signed)
Addended by: Derl Barrow on: 07/01/2015 01:50 PM   Modules accepted: Orders, Medications

## 2015-07-01 NOTE — Progress Notes (Signed)
Referral sent. Could not find her requested provider on the ophthalmology list.  Please inform the pt  Thanks  Kmari Brian A. Lincoln [name] MD, [facility] Resident PGY-2 Pager [phone]

## 2015-07-01 NOTE — Progress Notes (Signed)
Janumet re-orderd. Please in form the pt  Thanks  Alyssa A. Lincoln [name] MD, [place] [facility] Resident PGY-2 Pager [phone]

## 2015-07-02 ENCOUNTER — Ambulatory Visit (HOSPITAL_COMMUNITY)
Admission: RE | Admit: 2015-07-02 | Discharge: 2015-07-02 | Disposition: A | Discharge status: Home / Self Care | Payer: Medicaid Other | Source: Ambulatory Visit | Attending: Family Medicine | Admitting: Family Medicine

## 2015-07-02 ENCOUNTER — Other Ambulatory Visit: Payer: Self-pay | Admitting: Student

## 2015-07-02 DIAGNOSIS — Z1231 Encounter for screening mammogram for malignant neoplasm of breast: Secondary | ICD-10-CM

## 2015-07-02 DIAGNOSIS — G5621 Lesion of ulnar nerve, right upper limb: Secondary | ICD-10-CM

## 2015-07-02 DIAGNOSIS — Z1239 Encounter for other screening for malignant neoplasm of breast: Secondary | ICD-10-CM

## 2015-07-02 DIAGNOSIS — E1143 Type 2 diabetes mellitus with diabetic autonomic (poly)neuropathy: Secondary | ICD-10-CM

## 2015-07-02 LAB — HM MAMMOGRAPHY

## 2015-07-20 ENCOUNTER — Telehealth: Payer: Self-pay | Admitting: Student

## 2015-07-20 NOTE — Telephone Encounter (Signed)
Ms. Might is calling and would like to be referred to a hand specialist as her hand is causing her right much pain. Additionally, she needs a referral to an eye doctor because of her diabetes. Please contact pt when completed. Thank you, [name], ASA

## 2015-07-20 NOTE — Telephone Encounter (Signed)
Spoke to patient, patient has been referred to [facility] twice. The last time being in July. Patient's Medicaid card had the incorrect office on it and [facility] [place] would not schedule her until this was corrected. It is now corrected and I have called and LMOVM for [facility] to see if patient can be scheduled now. Will await a callback. Also, opthal appt was scheduled for patient, for tomorrow at [facility] (did not know she had a specific eye doctor she wanted to see because that info was not put in the referral note when referral was placed in July). Letter was mailed to patient, but she states she has been staying with her daughter, who just had surgery and did not get the letter. I will cancel the appt for tomorrow and schedule her for sometime sooner. No referrals needed to be placed in EPIC at this time.

## 2015-07-23 ENCOUNTER — Other Ambulatory Visit: Payer: Self-pay | Admitting: Student

## 2015-07-23 ENCOUNTER — Telehealth: Payer: Self-pay | Admitting: Student

## 2015-07-23 DIAGNOSIS — G609 Hereditary and idiopathic neuropathy, unspecified: Secondary | ICD-10-CM

## 2015-07-23 NOTE — Progress Notes (Signed)
Nerve conduction study ordered. Please inform pt   Heidi Lemay A. Lincoln [name] MD, [place] [facility] Resident PGY-2 Pager [phone]

## 2015-07-23 NOTE — Telephone Encounter (Signed)
Spoke to [place] at [facility], Dr. Charlotte Crumb. Before patient can be schedule with Dr. Burney Gauze, he is requiring patient to have nerve condution study. This can be performed at [facility] but they are needing an order for the study from her PCP. Requesting Dr. Lincoln [name] place an order for a nerve conduction study. Please let me know when this has been done so I can fax the order to [facility], so she can get the patient scheduled.

## 2015-07-23 NOTE — Progress Notes (Signed)
Will forward note to [place] so we can get this order faxed to [facility]. Mykaylah Ballman,CMA

## 2015-07-23 NOTE — Telephone Encounter (Signed)
Will forward to MD to place order for nerve conduction study. Arliene Rosenow,CMA

## 2015-07-30 ENCOUNTER — Ambulatory Visit: Payer: Medicaid Other | Admitting: Student

## 2015-07-31 LAB — HM DIABETES EYE EXAM

## 2015-08-05 ENCOUNTER — Telehealth: Payer: Self-pay | Admitting: Student

## 2015-08-05 NOTE — Telephone Encounter (Signed)
janumet---dr changed medication last month and now has diarrhea. Thinks it may be too strong Please advise

## 2015-08-05 NOTE — Telephone Encounter (Signed)
Will forward to PCP for review. [name], CMA. 

## 2015-08-05 NOTE — Telephone Encounter (Signed)
Janumet can cause diarrhea and some GI upset. If too distressing, please have her come tom the office for furtehr evaluation  Thanks  Marquitta Persichetti A. Lincoln [name] MD, [place] [facility] Resident PGY-2 Pager [phone]

## 2015-08-06 NOTE — Telephone Encounter (Signed)
Pt may be seen in clinic for further questions. Janumet should not cause these complaints. If she would like to again change her diabetic regimen, she will need to be seen in clinic  Alyssa A. Lincoln [name] MD, [place] [facility] Resident PGY-2 Pager [phone]

## 2015-08-06 NOTE — Telephone Encounter (Signed)
Spoke with pt and advised her as directed below and stated that she also has weakness in her legs and cramping in legs that fluctuates. She wanted to know if this med is too strong (Janumet). Please advise. Jullien Granquist, CMA.

## 2015-08-09 NOTE — Telephone Encounter (Signed)
Spoke with Veronica Simmons and patient has already performed nerve conduction study and is scheduled for Dr. Burney Gauze in September for an appt. Jazmin Hartsell,CMA

## 2015-08-20 ENCOUNTER — Telehealth: Payer: Medicaid Other | Admitting: Student

## 2015-08-20 DIAGNOSIS — E119 Type 2 diabetes mellitus without complications: Secondary | ICD-10-CM

## 2015-08-20 NOTE — Telephone Encounter (Signed)
Pt called and would like a refill on her Accu-Chek strips and lancets. jw

## 2015-08-24 MED ORDER — ONETOUCH ULTRASOFT LANCETS MISC: Status: DC

## 2015-08-24 NOTE — Telephone Encounter (Signed)
Test strips and lancets refilled  Brylin Stanislawski A. Lincoln [name] MD, [place] [facility] Resident PGY-2 Pager [phone]

## 2015-08-25 ENCOUNTER — Other Ambulatory Visit: Payer: Self-pay | Admitting: Student

## 2015-08-25 NOTE — Telephone Encounter (Signed)
Need refill on her test strips and lancets, but want them to be sent to [facility] on [address] and [address].

## 2015-08-25 NOTE — Telephone Encounter (Signed)
Patient does not have test strips listed on current medication list.  Derl Barrow, RN

## 2015-08-26 NOTE — Telephone Encounter (Signed)
Pt called to check the status of her test strips and lancets being transferred to [facility]. Patient also seen a commercial for an ankle brace and a hand brace. The company said that the except Medicaid. She would like the doctor to order these for her. She has the 1-800 number and item number when the doctor is ready to order these. jw

## 2015-08-26 NOTE — Telephone Encounter (Signed)
Will forward to PCP for refill request of lancets. Dorota Heinrichs, CMA.

## 2015-08-27 MED ORDER — ONETOUCH ULTRASOFT LANCETS MISC: Status: DC

## 2015-08-27 NOTE — Telephone Encounter (Signed)
Lancets refilled  Elenora Hawbaker A. Lincoln [name] MD, [place] [facility] Resident PGY-2 Pager [phone]

## 2015-08-30 ENCOUNTER — Telehealth: Payer: Medicaid Other | Admitting: Student

## 2015-08-30 DIAGNOSIS — E119 Type 2 diabetes mellitus without complications: Secondary | ICD-10-CM

## 2015-08-30 NOTE — Telephone Encounter (Signed)
Pt called because the pharmacy received the prescription for the patient Lancets, but did not receive a prescription for her strips. Can we call this in also. She is also looking to get a brace for her hand and foot. jw

## 2015-08-30 NOTE — Telephone Encounter (Signed)
Will forward to PCP for review. Evoleth Nordmeyer, CMA. 

## 2015-08-31 NOTE — Telephone Encounter (Signed)
Diabetic testing strips refilled  Veronica Simmons A. Lincoln [name] MD, [place] [facility] Resident PGY-2 Pager [phone]

## 2015-09-03 ENCOUNTER — Telehealth: Payer: Self-pay | Admitting: Student

## 2015-09-03 DIAGNOSIS — E118 Type 2 diabetes mellitus with unspecified complications: Secondary | ICD-10-CM

## 2015-09-03 NOTE — Telephone Encounter (Signed)
Accu-chek test strips are not listed on patient's med list.  Patient has medicaid and they are the preferred brand.  Derl Barrow, RN

## 2015-09-03 NOTE — Telephone Encounter (Signed)
Pt checking status of refill for her diabetic testing strips, pt uses the accuchek, goes to [facility]/ [address].

## 2015-09-03 NOTE — Telephone Encounter (Signed)
Accu check strips sent  Veronica Simmons [name] MD, [place] [facility] Resident PGY-2 Pager [phone]

## 2015-09-06 ENCOUNTER — Encounter: Payer: Self-pay | Admitting: Occupational Therapy

## 2015-09-06 ENCOUNTER — Ambulatory Visit: Payer: Medicaid Other | Attending: Orthopedic Surgery | Admitting: Occupational Therapy

## 2015-09-06 DIAGNOSIS — M25541 Pain in joints of right hand: Secondary | ICD-10-CM

## 2015-09-06 DIAGNOSIS — M2560 Stiffness of unspecified joint, not elsewhere classified: Secondary | ICD-10-CM | POA: Diagnosis present

## 2015-09-06 DIAGNOSIS — M79641 Pain in right hand: Secondary | ICD-10-CM | POA: Insufficient documentation

## 2015-09-06 NOTE — Therapy (Signed)
[facility] [address] [place], [place], [address] Phone: [phone]   Fax:  [phone]  Occupational Therapy Evaluation  Patient Details  Name: Veronica Simmons MRN: [id number] Date of Birth: 01/12/1951 Referring Provider:  Charlotte Crumb, MD  Encounter Date: 09/06/2015      OT End of Session - 09/06/15 1323    Visit Number 1   Date for OT Re-Evaluation 10/06/15   Authorization Type MCD - not a qualifying diagnosis for authorization   Activity Tolerance Patient tolerated treatment well      Past Medical History  Diagnosis Date  . Diabetes mellitus 2000    Never on insulin   . Hypertension 2000  . Arthritis   . Anemia 1970s    on iron   . ALLERGIC RHINITIS 02/19/2007    Qualifier: Diagnosis of  By: Lorne Skeens MD, VALENICA    . Cubital tunnel syndrome on right 10/31/2012    Secondary to old fracture. Evaluated by ortho. [facility] offered surgery but patient refused. Numbness and burning pain 4th and 5th digit.      History reviewed. No pertinent past surgical history.  There were no vitals filed for this visit.  Visit Diagnosis:  Joint pain in fingers of right hand  Stiffness of multiple joints      Subjective Assessment - 09/06/15 0822    Subjective  Thank you for these splints. They feel much better   Pertinent History Pt was seen last year for anti-claw splint due to ulnar neuropathy   Patient Stated Goals Get a more comfortable splint (b/c the previous one made is uncomfortable) and hopefully prevent surgery   Currently in Pain? Yes   Pain Score 5    Pain Location Hand   Pain Orientation Right   Pain Descriptors / Indicators Pins and needles;Throbbing;Sharp   Pain Type Chronic pain   Pain Onset --  since 2010   Pain Frequency Intermittent   Aggravating Factors  nothing   Pain Relieving Factors brace with rest           [facility] OT Assessment - 09/06/15 0001    Assessment    Diagnosis Rt ulnar neuropathy   Onset Date --  since 2010   Assessment Pt with clawing of 4th and 5th digits Rt hand   Prior Therapy last year (fabricated anti-claw splint)    Precautions   Precautions None  reported   Required Braces or Orthoses Other Brace/Splint   Other Brace/Splint MD sent order to adjust current splint to include 4th-5th digits only, however due to position/nature of anti-claw splint, unable to do and provide the stability needed to prevent clawing. Therefore made new splint (see treatment below)   Prior Function   Level of Independence Independent   Vocation Part time employment   Vocation Requirements counseling  However, pt reports may be filing for long term disability   Written Expression   Dominant Hand Right                  OT Treatments/Exercises (OP) - 09/06/15 0001    Splinting   Splinting Order from MD to adjust current anti-claw splint to only include 4th-5th digits, however unable to do and provide the stability needed to prevent clawing. Therefore, fabricated and fitted new hand based dorsal splint (for daytime) with 4th-5th digits placed in MP flexion to prevent clawing and to allow PIP extension. Issued splint. Therapist also fabricated hand based volar splint (for night wear) to include  4th and 5th digits in MP flexion and full IP extension. Issued 2nd splint.                OT Education - 09/06/15 0930    Education provided Yes   Education Details splint wear and care   Person(s) Educated Patient   Methods Explanation;Demonstration;Handout   Comprehension Verbalized understanding             OT Long Term Goals - 09/06/15 1323    OT LONG TERM GOAL #1   Title Independent with splint wear and care   Baseline issued, may need adjustments   Time 4   Period Weeks   Status On-going               Plan - 09/06/15 1324    Clinical Impression Statement Pt is a 64 y.o. female who returns today s/p ulnar  neuropathy Rt side for adjustments to current anti-claw splint (fabricated last year). However, made new splints today (see assessment/treatment) for reasons.    Pt will benefit from skilled therapeutic intervention in order to improve on the following deficits (Retired) Decreased range of motion;Impaired UE functional use;Pain;Decreased strength   OT Frequency --  1-3 visits for splint adjustments only   OT Duration 4 weeks   OT Treatment/Interventions Self-care/ADL training;Patient/family education;Splinting   Plan splint adjustments prn   OT Home Exercise Plan 09/06/15: splint wear and care education   Consulted and Agree with Plan of Care Patient        Problem List Patient Active Problem List   Diagnosis Date Noted  . Health care maintenance 06/28/2015  . Anemia 12/21/2014  . Palpitations 01/07/2014  . Recurrent yeast vaginitis 11/05/2013  . Recurrent genital herpes 07/17/2013  . Hot flashes not due to menopause 11/27/2012  . Cubital tunnel syndrome on right 10/31/2012  . Type 2 diabetes, controlled, with neuropathy 02/14/2007  . HYPERLIPIDEMIA 02/14/2007  . OBESITY, NOS 02/14/2007  . HYPERTENSION, BENIGN SYSTEMIC 02/14/2007  . ARTHRITIS 02/14/2007  . Insomnia 02/14/2007    Carey Bullocks, OTR/L 09/06/2015, 1:27 PM  [facility] [address] [place], [place], [address] Phone: [phone]   Fax:  [phone]

## 2015-09-06 NOTE — Telephone Encounter (Signed)
Pt called because she said that the pharmacy does not have the Accu-chek strips. Can we call this in again. Also she would like a referral to see a Arthritis specialist. Veronica Simmons

## 2015-09-06 NOTE — Patient Instructions (Signed)
Your Splint This splint should initially be fitted by a healthcare practitioner.  The healthcare practitioner is responsible for providing wearing instructions and precautions to the patient, other healthcare practitioners and care provider involved in the patient's care.  This splint was custom made for you. Please read the following instructions to learn about wearing and caring for your splint.  Precautions Should your splint cause any of the following problems, remove the splint immediately and contact your therapist/physician.  Swelling  Severe Pain  Pressure Areas  Stiffness  Numbness  Do not wear your splint while operating machinery unless it has been fabricated for that purpose.  When To Wear Your Splint Where your splint according to your therapist/physician instructions. Wear daytime splint during day (can take off for showers, washing dishes, etc). This splint fits the back of your hand Wear splint that is on the palm side of your hand for night time  Care and Cleaning of Your Splint 1. Keep your splint away from open flames. 2. Your splint will lose its shape in temperatures over 135 degrees Farenheit, ( in car windows, near radiators, ovens or in hot water).  Never make any adjustments to your splint, if the splint needs adjusting remove it and make an appointment to see your therapist. 3. Your splint may be cleaned with rubbing alcohol.  Do not immerse in hot water over 135 degrees Farenheit. 4. Straps may be washed with soap and water, but do not moisten the self-adhesive portion. 5. For ink or hard to remove spots use a scouring cleanser which contains chlorine.  Rinse the splint thoroughly after using chlorine cleanser. 6.

## 2015-09-07 MED ORDER — GLUCOSE BLOOD VI STRP: ORAL_STRIP | Status: DC

## 2015-09-07 NOTE — Telephone Encounter (Signed)
Spoke with patient and informed her that script was called into the pharmacy.  I also informed her that her pcp is not in clinic this week and that she can check the status of her referral at the end of next week.  Pt voiced understanding. Jazmin Hartsell,CMA

## 2015-09-07 NOTE — Telephone Encounter (Signed)
Pt called and would like to know what's going on with her strips we keep sending them but the pharmacy said they are not getting them. jw

## 2015-09-13 ENCOUNTER — Telehealth: Payer: Self-pay | Admitting: Student

## 2015-09-13 DIAGNOSIS — E119 Type 2 diabetes mellitus without complications: Secondary | ICD-10-CM

## 2015-09-13 NOTE — Telephone Encounter (Signed)
Need new Accucheck gucometer sent to [facility] on [address] and [address].

## 2015-09-16 NOTE — Telephone Encounter (Signed)
Pt called because she is checking the status of her Accucheck glucometer. She asked for this on 09/13/15 and the pharmacy said that they have not received anything from [place]. Also she is looking to see if she can get a monitor that shocks you with electronic pulses to keep her blood flowing through her hands.Please call patient and let her know of the update on her glucometer. jw

## 2015-09-16 NOTE — Telephone Encounter (Signed)
Accucheck device ordered.    Alyssa A. Lincoln [name] MD, [place] [facility] Resident PGY-2 Pager [phone]

## 2015-09-16 NOTE — Telephone Encounter (Signed)
Will forward to PCP for review of sending new Accucheck glucometer rx to pharmacy. Adams,Latoya, CMA.

## 2015-09-22 ENCOUNTER — Encounter: Payer: Self-pay | Admitting: Student

## 2015-09-29 ENCOUNTER — Telehealth: Payer: Self-pay | Admitting: Student

## 2015-09-29 NOTE — Telephone Encounter (Signed)
Will forward to MD.  Patient may need to be seen in clinic before referral can be placed. Veronica Simmons,CMA

## 2015-09-29 NOTE — Telephone Encounter (Signed)
Would like referral to arthritis specialist---[facility] --fax number [phone]

## 2015-09-30 NOTE — Telephone Encounter (Signed)
Pt will need to be seen in clinic prior to referral being made

## 2015-10-07 ENCOUNTER — Other Ambulatory Visit: Payer: Self-pay | Admitting: Student

## 2015-10-07 DIAGNOSIS — G4700 Insomnia, unspecified: Secondary | ICD-10-CM

## 2015-10-07 MED ORDER — TRAZODONE HCL 50 MG PO TABS: 25.0000 mg | ORAL_TABLET | Freq: Every evening | ORAL | Status: DC | PRN

## 2015-10-07 NOTE — Telephone Encounter (Signed)
Pt calling and needs a refill on Trazodone. Veronica Simmons, ASA

## 2015-10-07 NOTE — Telephone Encounter (Signed)
Trazodone refilled  Alyssa A. Lincoln [name] MD, [place] [facility] Resident PGY-2 Pager [phone]

## 2015-10-21 ENCOUNTER — Ambulatory Visit: Payer: Medicaid Other | Admitting: Student

## 2015-10-26 ENCOUNTER — Encounter: Payer: Self-pay | Admitting: Student

## 2015-10-26 ENCOUNTER — Ambulatory Visit (INDEPENDENT_AMBULATORY_CARE_PROVIDER_SITE_OTHER): Payer: Medicaid Other | Admitting: Student

## 2015-10-26 VITALS — BP 132/70 | HR 58 | Temp 98.5°F | Wt 224.0 lb

## 2015-10-26 DIAGNOSIS — Z0000 Encounter for general adult medical examination without abnormal findings: Secondary | ICD-10-CM

## 2015-10-26 DIAGNOSIS — E1140 Type 2 diabetes mellitus with diabetic neuropathy, unspecified: Secondary | ICD-10-CM

## 2015-10-26 LAB — POCT GLYCOSYLATED HEMOGLOBIN (HGB A1C): Hemoglobin A1C: 7.7

## 2015-10-26 NOTE — Assessment & Plan Note (Signed)
Will need colonoscopy and pap smear.  - Colonoscopy papers already given, will follow up procedure - will need pap at next visit

## 2015-10-26 NOTE — Progress Notes (Signed)
   Subjective:    Patient ID: Veronica Simmons, female    DOB: [birth year], 64 y.o.   MRN: [id number]   CC: legs cold at night  HPI 64 y/o with PMH DM here for concern of her legs getting cold at night  Knees cold at night  -Ongoing for last several years.  - She feels as if she does to have " good circulation in the lower part of her body" and would like to see an arthritic specialist - denies leg or knee pain, numbness, stiffness or decreased ROM - she does have a history of diabetic neuropathy in her legs and this feels different from this - legs feel better after adding more blankets   DM - A1c 7.7 today from 8/1 in 04/2015. She has been trying to exercise but admits that her diet is not as good as it should be. She would like some help with a diet plan  Review of Systems   See HPI for ROS.   Past Medical History  Diagnosis Date  . Diabetes mellitus 2000    Never on insulin   . Hypertension 2000  . Arthritis   . Anemia 1970s    on iron   . ALLERGIC RHINITIS 02/19/2007    Qualifier: Diagnosis of  By: Lorne Skeens MD, VALENICA    . Cubital tunnel syndrome on right 10/31/2012    Secondary to old fracture. Evaluated by ortho. [facility] offered surgery but patient refused. Numbness and burning pain 4th and 5th digit.     No past surgical history on file. OB History    No data available     Social History   Social History  . Marital Status: Divorced    Spouse Name: N/A  . Number of Children: 4  . Years of Education: Masters Cu   Occupational History  . [employer]    Social History Main Topics  . Smoking status: Never Smoker   . Smokeless tobacco: Never Used  . Alcohol Use: No  . Drug Use: No  . Sexual Activity: No   Other Topics Concern  . Not on file   Social History Narrative   Lives with eldest daughter (on dialysis) serves as her primary caretaker.           Objective:  BP 132/70 mmHg  Pulse 58  Temp(Src) 98.5 F (36.9  C) (Oral)  Wt 224 lb (101.606 kg) Vitals and nursing note reviewed  General: NAD Cardiac: RRR,  Respiratory: CTAB, normal effort Extremities: no LE edema or cyanosis. WWP. 1+ patellar reflexes, normal ROM Skin: warm and dry, no rashes noted Neuro: alert and oriented, no focal deficits   Assessment & Plan:    Type 2 diabetes, controlled, with neuropathy A1c 7.7 from 8.1 - continue Farxiga, diet and exercise - referral made to Dr Jenne Campus for nutrition consult to help with further diet modifications  Health care maintenance Will need colonoscopy and pap smear.  - Colonoscopy papers already given, will follow up procedure - will need pap at next visit   Knee cold sensation at night - Potentially a sequelae fo diabetic neuropathy but her symptoms only occuring at night make this less likely - Pt will continue conservative measures of adding blankets at night as needed   Veronica Simmons A. Lincoln [name] MD, [place] [facility] Resident PGY-1 Pager [phone]

## 2015-10-26 NOTE — Assessment & Plan Note (Signed)
A1c 7.7 from 8.1 - continue Farxiga, diet and exercise - referral made to Dr Jenne Campus for nutrition consult to help with further diet modifications

## 2015-10-26 NOTE — Patient Instructions (Addendum)
Return to clinic in 3 months Your A1c was 7.7 today. Congrats! Please continue to eat healthfully and get exercise Obtain twice as many veg's as protein or carbohydrate foods for both lunch and dinner. The card for our clinic Nutritionist was given to you. Please call to make an appointment

## 2015-12-29 ENCOUNTER — Other Ambulatory Visit: Payer: Self-pay | Admitting: Student

## 2015-12-29 MED ORDER — GLUCOSE BLOOD VI STRP: ORAL_STRIP | Status: AC

## 2015-12-29 NOTE — Telephone Encounter (Signed)
Testing strips refilled

## 2015-12-29 NOTE — Telephone Encounter (Signed)
Refill request for test strips

## 2016-01-06 ENCOUNTER — Ambulatory Visit (INDEPENDENT_AMBULATORY_CARE_PROVIDER_SITE_OTHER): Payer: Medicaid Other | Admitting: Student

## 2016-01-06 ENCOUNTER — Encounter: Payer: Self-pay | Admitting: Student

## 2016-01-06 ENCOUNTER — Other Ambulatory Visit: Payer: Self-pay | Admitting: Student

## 2016-01-06 VITALS — BP 146/70 | HR 80 | Temp 97.9°F | Wt 226.0 lb

## 2016-01-06 DIAGNOSIS — R197 Diarrhea, unspecified: Secondary | ICD-10-CM | POA: Diagnosis not present

## 2016-01-06 MED ORDER — GLUCOSE BLOOD VI STRP: ORAL_STRIP | Status: AC

## 2016-01-06 MED ORDER — LOPERAMIDE HCL 2 MG PO TABS: 2.0000 mg | ORAL_TABLET | Freq: Four times a day (QID) | ORAL | Status: DC | PRN

## 2016-01-06 NOTE — Progress Notes (Signed)
   Subjective:    Patient ID: Veronica Simmons, female    DOB: 24-Jan-1951, 65 y.o.   MRN: [id number]   CC: Diarrhea  HPI:  65 y/o presenting for intermittent diarrhea for the last 3 months  Diarrhea - Reports that since starting Janumet ( sitagliptin-metformin) she has diarrhea after eating sugar/carb containing foods - she typically tries to avoid these foods, but Saturday is her " cheat day" - on Sunday she has 3-4 loose brown stools - more recently on 1/17, she had very carbohydrate heavy food and had 5-6 loose stools on 1/18 - denies black stools or bright red blood in her stool - denies N/V, fever, abdominal pain - she has similar issues when on metformin - she denies frequent loose stools prior to starting metfomin - last episode of loose stool was this AM - does not eat sugar free candy, she does drink diet pepsi. She has not noted loose stool after drinking this    Review of Systems ROS Per HPI else denies recent illness, SOB, chest pain, weakness  Past Medical, Surgical, Social, and Family History Reviewed & Updated per EMR.   Objective:  BP 146/70 mmHg  Pulse 80  Temp(Src) 97.9 F (36.6 C) (Oral)  Wt 226 lb (102.513 kg)  SpO2 95% Vitals and nursing note reviewed  General: NAD Cardiac: RRR,  Respiratory: CTAB, normal effort Abdomen: soft, nontender, nondistended, + BS Neuro: alert and oriented, no focal deficits   Assessment & Plan:    Diarrhea Diarrhea with clear precipitant of sugar containing foods in setting of metformin use. After discussion with patient regarding possible changing her Janumet to another agent versus further reducing her sugar intake, the patient decided she is pleased with the reduction in A1c she had with Janumet and feels these symptoms are a sign she should eat less sugar - Will continue to follow closely       Riki Gehring A. Lincoln [name] MD, [place] [facility] Resident PGY-2 Pager [phone]

## 2016-01-06 NOTE — Patient Instructions (Signed)
Follow up in one month for Diabetes check If you have questions or concerns, call the office at [phone]

## 2016-01-07 NOTE — Telephone Encounter (Signed)
Pt says RX for amlodapine and test strips is pending because her insurance will not cover them. She has Medicaid

## 2016-01-09 DIAGNOSIS — R197 Diarrhea, unspecified: Secondary | ICD-10-CM | POA: Insufficient documentation

## 2016-01-09 NOTE — Assessment & Plan Note (Signed)
Diarrhea with clear precipitant of sugar containing foods in setting of metformin use. After discussion with patient regarding possible changing her Janumet to another agent versus further reducing her sugar intake, the patient decided she is pleased with the reduction in A1c she had with Janumet and feels these symptoms are a sign she should eat less sugar - Will continue to follow closely

## 2016-01-14 ENCOUNTER — Other Ambulatory Visit: Payer: Self-pay | Admitting: Student

## 2016-01-14 ENCOUNTER — Telehealth: Payer: Self-pay | Admitting: Student

## 2016-01-14 DIAGNOSIS — M25521 Pain in right elbow: Secondary | ICD-10-CM

## 2016-01-14 NOTE — Telephone Encounter (Signed)
Will forward to MD. Jazmin Hartsell,CMA  

## 2016-01-14 NOTE — Telephone Encounter (Signed)
Pt called and would like a referral to the PT at [facility].jw

## 2016-01-14 NOTE — Telephone Encounter (Signed)
Pt needs a new new prescription for her Janumet and also if something different can be called in for sleep. jw

## 2016-01-14 NOTE — Telephone Encounter (Signed)
PT referral made

## 2016-02-02 ENCOUNTER — Ambulatory Visit: Payer: Medicare Other | Attending: Family Medicine | Admitting: Physical Therapy

## 2016-02-02 ENCOUNTER — Encounter: Payer: Self-pay | Admitting: Physical Therapy

## 2016-02-02 DIAGNOSIS — X58XXXD Exposure to other specified factors, subsequent encounter: Secondary | ICD-10-CM | POA: Insufficient documentation

## 2016-02-02 DIAGNOSIS — S5401XD Injury of ulnar nerve at forearm level, right arm, subsequent encounter: Secondary | ICD-10-CM | POA: Insufficient documentation

## 2016-02-02 NOTE — Therapy (Signed)
[address] [place], [place], [address] Phone: [phone]   Fax:  [phone]  Physical Therapy Evaluation  Patient Details  Name: Veronica Simmons MRN: [id number] Date of Birth: 1951-07-17 Referring Provider: Lincoln [name]  Encounter Date: 02/02/2016      PT End of Session - 02/02/16 1326    Visit Number 1   Authorization Type medicad   PT Start Time 1300   PT Stop Time 1348   PT Time Calculation (min) 48 min   Activity Tolerance Patient tolerated treatment well   Behavior During Therapy [facility] for tasks assessed/performed      Past Medical History  Diagnosis Date  . Diabetes mellitus 2000    Never on insulin   . Hypertension 2000  . Arthritis   . Anemia 1970s    on iron   . ALLERGIC RHINITIS 02/19/2007    Qualifier: Diagnosis of  By: Lorne Skeens MD, VALENICA    . Cubital tunnel syndrome on right 10/31/2012    Secondary to old fracture. Evaluated by ortho. [facility] offered surgery but patient refused. Numbness and burning pain 4th and 5th digit.      History reviewed. No pertinent past surgical history.  There were no vitals filed for this visit.  Visit Diagnosis:  Injury of right ulnar nerve, subsequent encounter - Plan: PT plan of care cert/re-cert      Subjective Assessment - 02/02/16 1304    Subjective Patient reports right upper arm pain about 5-6 years. She reports that she broke the arm about 50 years ago.  She reports that over the past 2 years she has had more pain in the right hand and difficulty with ADL's   Patient Stated Goals have less pain and use the right arm   Currently in Pain? Yes   Pain Score 3    Pain Location Finger (Comment which one)  right 4th and 5th fingers   Pain Orientation Right   Pain Descriptors / Indicators Aching;Tightness;Throbbing   Pain Type Chronic pain   Pain Onset More than a month ago   Pain Frequency Constant   Aggravating Factors   difficulty with ADL's   Pain Relieving Factors having a brace on the hand helps some            [facility] PT Assessment - 02/02/16 0001    Assessment   Medical Diagnosis right ulnar nerve pain   Referring Provider Haney   Onset Date/Surgical Date 01/14/16   Prior Therapy had OT in the past year   Balance Screen   Has the patient fallen in the past 6 months No   Has the patient had a decrease in activity level because of a fear of falling?  No   Is the patient reluctant to leave their home because of a fear of falling?  No   Home Environment   Additional Comments does housework, some yardwork, does care for her adult daughter that is having health issues   Prior Function   Level of Independence Independent   Leisure no exercise   AROM   Overall AROM Comments AROM of the right elbow 30 degrees from full extension, supination to 50 degrees, right fifth finger PIP is held in 70 degrees flexion with the DIP held in 45 degrees flexion, 4th finger PIP is at 60 degrees flexion, passively I can get her straight,    Strength   Overall Strength Comments right grip strength 40#, left 55#  Palpation   Palpation comment tightness in the palmar aspect of the 3rd, 4th and 5th digits, she has muscle wasting of the thenar eminence                   [facility] Adult PT Treatment/Exercise - 02/02/16 0001    Modalities   Modalities Paraffin   RUE Paraffin   Number Minutes Paraffin 15 Minutes   RUE Paraffin Location Hand                PT Education - 02/02/16 1325    Education provided Yes   Education Details stretches for fingers and wrist   Person(s) Educated Patient   Methods Explanation;Demonstration   Comprehension Verbalized understanding             PT Long Term Goals - 02/02/16 1332    PT LONG TERM GOAL #1   Title independent with HEP   Time 1   Period Days   Status Achieved               Plan - 02/02/16 1328    Clinical Impression Statement Patient  seems to have an ulnar nerve issue, with the 4th and 5th fingers drawing into flexion, however she also has wasting of the thenar eminence.  She has medicaid so only gets one visit a year.  I encouraged her to apply for Medicare secondary to her turning 15 last month.   Pt will benefit from skilled therapeutic intervention in order to improve on the following deficits Decreased range of motion;Impaired flexibility   Rehab Potential Good   PT Frequency One time visit   PT Treatment/Interventions Parrafin;Patient/family education   PT Next Visit Plan patient is to try HEP and look for a parratin bath online   Consulted and Agree with Plan of Care Patient         Problem List Patient Active Problem List   Diagnosis Date Noted  . Diarrhea 01/09/2016  . Health care maintenance 06/28/2015  . Anemia 12/21/2014  . Palpitations 01/07/2014  . Recurrent yeast vaginitis 11/05/2013  . Recurrent genital herpes 07/17/2013  . Hot flashes not due to menopause 11/27/2012  . Cubital tunnel syndrome on right 10/31/2012  . Type 2 diabetes, controlled, with neuropathy ([place]) 02/14/2007  . HYPERLIPIDEMIA 02/14/2007  . OBESITY, NOS 02/14/2007  . HYPERTENSION, BENIGN SYSTEMIC 02/14/2007  . ARTHRITIS 02/14/2007  . Insomnia 02/14/2007    Sumner Boast., PT 02/02/2016, 1:39 PM  [address] [place], [place], [address] Phone: [phone]   Fax:  [phone]  Name: Veronica Simmons MRN: [id number] Date of Birth: [birth year]

## 2016-03-06 ENCOUNTER — Telehealth: Payer: Self-pay | Admitting: *Deleted

## 2016-03-06 NOTE — Telephone Encounter (Signed)
Pt needs a new splint for her arm, she has had this one for about 6 months.  In order to go back to the neuro rehab she will need a referral.  She wants to know if MD will send new referral or if she needs to be seen first. Eldar Robitaille, Salome Spotted, [place]

## 2016-03-14 ENCOUNTER — Ambulatory Visit (INDEPENDENT_AMBULATORY_CARE_PROVIDER_SITE_OTHER): Payer: Medicare Other | Admitting: Student

## 2016-03-14 ENCOUNTER — Encounter: Payer: Self-pay | Admitting: Student

## 2016-03-14 VITALS — BP 160/74 | HR 100 | Temp 98.3°F | Wt 222.2 lb

## 2016-03-14 DIAGNOSIS — G5621 Lesion of ulnar nerve, right upper limb: Secondary | ICD-10-CM | POA: Diagnosis not present

## 2016-03-14 DIAGNOSIS — S6991XS Unspecified injury of right wrist, hand and finger(s), sequela: Secondary | ICD-10-CM | POA: Diagnosis present

## 2016-03-14 DIAGNOSIS — E1140 Type 2 diabetes mellitus with diabetic neuropathy, unspecified: Secondary | ICD-10-CM | POA: Diagnosis not present

## 2016-03-14 DIAGNOSIS — I10 Essential (primary) hypertension: Secondary | ICD-10-CM

## 2016-03-14 MED ORDER — GLYBURIDE 5 MG PO TABS: 5.0000 mg | ORAL_TABLET | Freq: Every day | ORAL | Status: DC

## 2016-03-14 NOTE — Assessment & Plan Note (Signed)
Last A1c 7.7 in 10/2015. Patient tolerance to metformin. -Will stop Janumet and start glyburide - Wi 100 Microvena next visit ll titrate up as needed - A1c at next visit -

## 2016-03-14 NOTE — Assessment & Plan Note (Signed)
Ambulatory referral to hand surgery made to evaluate right hand injury and chronic pain.

## 2016-03-14 NOTE — Patient Instructions (Signed)
Follow-up in 2 weeks for diabetes check  We discussed over-the-counter cold and flu remedies like Alka-Seltzer cold and flu. You may use this as needed If you have questions or concerns call the office at [phone]

## 2016-03-14 NOTE — Assessment & Plan Note (Addendum)
Blood pressure above goal today - Pt non compliant with ACE-i or aspirin - Will continue to encourage compliance

## 2016-03-14 NOTE — Progress Notes (Signed)
   Subjective:    Patient ID: Veronica Simmons, female    DOB: [birth year], 65 y.o.   MRN: [id number]   CC: Diarrhea, cold sy 150/74 mptoms  HPI: 65 year old female presenting for diarrhea and concern for cold symptoms  Diarrhea - Patient previously reported diarrhea with Janumet, intolerance to metformin component. - Previously stated diarrhea after eating sweets, bread - Continues to have diarrhea with these diet options but now desires to stop metformin - Otherwise denies hypoglycemia blood sugars ranging from 150-200 - Denies shortness breath, chest pain  Cold symptoms - Has had congestion, cough for 5 days - No known sick contacts - Has tried NyQuil with minimal relief - Denies fevers  Right hand carpal tunnel - Treated with pain brace provided by hand surgery - Patient like reevaluation to consider further treatment options  Review of Systems ROS Per history of present illness otherwise denies nausea vomiting, abdominal pain, headache, weakness Past Medical, Surgical, Social, and Family History Reviewed & Updated per EMR.   Objective:  BP 160/74 mmHg  Pulse 100  Temp(Src) 98.3 F (36.8 C) (Oral)  Wt 222 lb 3.2 oz (100.789 kg) Vitals and nursing note reviewed  General: NAD Cardiac: RRR, Respiratory: CTAB, normal effort Extremities: Right hand in brace Skin: warm and dry, no rashes noted Neuro: alert and oriented, no focal deficits   Assessment & Plan:    Cubital tunnel syndrome on right Ambulatory referral to hand surgery made to evaluate right hand injury and chronic pain.  Type 2 diabetes, controlled, with neuropathy Last A1c 7.7 in 10/2015. Patient tolerance to metformin. -Will stop Janumet and start glyburide - Wi 100 Microvena next visit ll titrate up as needed - A1c at next visit -   HYPERTENSION, BENIGN SYSTEMIC Blood pressure above goal today - Pt non compliant with ACE-i or aspirin - Will continue to encourage compliance     Andrena Margerum A.  Lincoln [name] MD, [facility] Resident PGY-2 Pager [phone]

## 2016-03-20 ENCOUNTER — Telehealth: Payer: Self-pay | Admitting: Student

## 2016-03-20 NOTE — Telephone Encounter (Signed)
Lm for patient that she would need to call back with her glucose readings and also that she would need to try this medication for 2 weeks before changing dosage.  She needs to make an appt to see PCP to discuss this concern.  Shawntez Dickison,CMA

## 2016-03-20 NOTE — Telephone Encounter (Signed)
Pt started a new blood sugar medication once a day. She said that she feels she needs to take this at least twice a day like her previous medication. She said that her blood sugars have been very high since starting her new medication. jw

## 2016-03-21 NOTE — Telephone Encounter (Signed)
Patient given information.  Please close encounter

## 2016-03-23 ENCOUNTER — Telehealth: Payer: Self-pay | Admitting: Student

## 2016-03-23 NOTE — Telephone Encounter (Signed)
Need to discuss the medication dosage.  Can't continue taking as prescribed because she's have urinary incontinence.  Waking up every 2 hrs during the night.  NOT getting enough rest.  Please contact patient to advise.

## 2016-03-24 ENCOUNTER — Telehealth: Payer: Self-pay | Admitting: Student

## 2016-03-24 NOTE — Telephone Encounter (Signed)
Patient called regarding concern for increased urinary frequency. Does not reports abdominal pain or fevers. Blood sugars have been ranging in to 200-300s. She was switched from Janumet to glyburide 5mg  due to medication intolerance. Patient advised to increased glyburide to 5 mg BID. She has an appointment on 4/11. She will bring her recorded blood sugar readings to this visit

## 2016-03-27 ENCOUNTER — Emergency Department (HOSPITAL_COMMUNITY)
Admission: EM | Admit: 2016-03-27 | Discharge: 2016-03-28 | Disposition: A | Discharge status: Home / Self Care | Payer: Medicare Other | Attending: Emergency Medicine | Admitting: Emergency Medicine

## 2016-03-27 ENCOUNTER — Telehealth: Payer: Self-pay | Admitting: Student

## 2016-03-27 ENCOUNTER — Encounter (HOSPITAL_COMMUNITY): Payer: Self-pay | Admitting: Emergency Medicine

## 2016-03-27 DIAGNOSIS — Z79899 Other long term (current) drug therapy: Secondary | ICD-10-CM | POA: Insufficient documentation

## 2016-03-27 DIAGNOSIS — D649 Anemia, unspecified: Secondary | ICD-10-CM | POA: Insufficient documentation

## 2016-03-27 DIAGNOSIS — M1990 Unspecified osteoarthritis, unspecified site: Secondary | ICD-10-CM | POA: Diagnosis not present

## 2016-03-27 DIAGNOSIS — E1165 Type 2 diabetes mellitus with hyperglycemia: Secondary | ICD-10-CM | POA: Insufficient documentation

## 2016-03-27 DIAGNOSIS — R739 Hyperglycemia, unspecified: Secondary | ICD-10-CM

## 2016-03-27 DIAGNOSIS — Z8669 Personal history of other diseases of the nervous system and sense organs: Secondary | ICD-10-CM | POA: Insufficient documentation

## 2016-03-27 DIAGNOSIS — R030 Elevated blood-pressure reading, without diagnosis of hypertension: Secondary | ICD-10-CM

## 2016-03-27 DIAGNOSIS — I10 Essential (primary) hypertension: Secondary | ICD-10-CM | POA: Diagnosis not present

## 2016-03-27 DIAGNOSIS — IMO0001 Reserved for inherently not codable concepts without codable children: Secondary | ICD-10-CM

## 2016-03-27 LAB — BASIC METABOLIC PANEL
Anion gap: 10 (ref 5–15)
BUN: 22 mg/dL — ABNORMAL HIGH (ref 6–20)
CO2: 24 mmol/L (ref 22–32)
Calcium: 9.6 mg/dL (ref 8.9–10.3)
Chloride: 101 mmol/L (ref 101–111)
Creatinine, Ser: 0.96 mg/dL (ref 0.44–1.00)
GFR calc Af Amer: >60 mL/min (ref >60)
GFR calc non Af Amer: >60 mL/min (ref >60)
Glucose, Bld: 434 mg/dL — ABNORMAL HIGH (ref 65–99)
Potassium: 4 mmol/L (ref 3.5–5.1)
Sodium: 135 mmol/L (ref 135–145)

## 2016-03-27 LAB — CBC
HCT: 34.5 % — ABNORMAL LOW (ref 36.0–46.0)
Hemoglobin: 11.3 g/dL — ABNORMAL LOW (ref 12.0–15.0)
MCH: 28.5 pg (ref 26.0–34.0)
MCHC: 32.8 g/dL (ref 30.0–36.0)
MCV: 87.1 fL (ref 78.0–100.0)
Platelets: 314 10*3/uL (ref 150–400)
RBC: 3.96 MIL/uL (ref 3.87–5.11)
RDW: 13.6 % (ref 11.5–15.5)
WBC: 6.5 10*3/uL (ref 4.0–10.5)

## 2016-03-27 LAB — URINE MICROSCOPIC-ADD ON: WBC UA: NONE SEEN WBC/hpf (ref 0–5)

## 2016-03-27 LAB — URINALYSIS, ROUTINE W REFLEX MICROSCOPIC
Bilirubin Urine: NEGATIVE
Glucose, UA: >1000 mg/dL — AB
Ketones, ur: NEGATIVE mg/dL
Leukocytes, UA: NEGATIVE
Nitrite: NEGATIVE
Protein, ur: 100 mg/dL — AB
Specific Gravity, Urine: 1.031 — ABNORMAL HIGH (ref 1.005–1.030)
pH: 6.5 (ref 5.0–8.0)

## 2016-03-27 LAB — CBG MONITORING, ED
GLUCOSE-CAPILLARY: 313 mg/dL — AB (ref 65–99)
Glucose-Capillary: 408 mg/dL — ABNORMAL HIGH (ref 65–99)

## 2016-03-27 MED ORDER — LABETALOL HCL 5 MG/ML IV SOLN
20.0000 mg | Freq: Once | INTRAVENOUS | Status: AC
  Administered 2016-03-27: 20 mg via INTRAVENOUS
  Filled 2016-03-27: qty 4

## 2016-03-27 MED ORDER — INSULIN ASPART 100 UNIT/ML ~~LOC~~ SOLN
5.0000 [IU] | Freq: Once | SUBCUTANEOUS | Status: AC
  Administered 2016-03-27: 5 [IU] via SUBCUTANEOUS
  Filled 2016-03-27: qty 1

## 2016-03-27 MED ORDER — SITAGLIPTIN PHOS-METFORMIN HCL 50-500 MG PO TABS: 1.0000 | ORAL_TABLET | Freq: Two times a day (BID) | ORAL | Status: DC

## 2016-03-27 MED ORDER — SODIUM CHLORIDE 0.9 % IV BOLUS (SEPSIS)
1000.0000 mL | Freq: Once | INTRAVENOUS | Status: AC
  Administered 2016-03-27: 1000 mL via INTRAVENOUS

## 2016-03-27 NOTE — Discharge Instructions (Signed)
Hypertension Hypertension, commonly called high blood pressure, is when the force of blood pumping through your arteries is too strong. Your arteries are the blood vessels that carry blood from your heart throughout your body. A blood pressure reading consists of a higher number over a lower number, such as 110/72. The higher number (systolic) is the pressure inside your arteries when your heart pumps. The lower number (diastolic) is the pressure inside your arteries when your heart relaxes. Ideally you want your blood pressure below 120/80. Hypertension forces your heart to work harder to pump blood. Your arteries may become narrow or stiff. Having untreated or uncontrolled hypertension can cause heart attack, stroke, kidney disease, and other problems. RISK FACTORS Some risk factors for high blood pressure are controllable. Others are not.  Risk factors you cannot control include:   Race. You may be at higher risk if you are African American.  Age. Risk increases with age.  Gender. Men are at higher risk than women before age 45 years. After age 65, women are at higher risk than men. Risk factors you can control include:  Not getting enough exercise or physical activity.  Being overweight.  Getting too much fat, sugar, calories, or salt in your diet.  Drinking too much alcohol. SIGNS AND SYMPTOMS Hypertension does not usually cause signs or symptoms. Extremely high blood pressure (hypertensive crisis) may cause headache, anxiety, shortness of breath, and nosebleed. DIAGNOSIS To check if you have hypertension, your health care provider will measure your blood pressure while you are seated, with your arm held at the level of your heart. It should be measured at least twice using the same arm. Certain conditions can cause a difference in blood pressure between your right and left arms. A blood pressure reading that is higher than normal on one occasion does not mean that you need treatment. If  it is not clear whether you have high blood pressure, you may be asked to return on a different day to have your blood pressure checked again. Or, you may be asked to monitor your blood pressure at home for 1 or more weeks. TREATMENT Treating high blood pressure includes making lifestyle changes and possibly taking medicine. Living a healthy lifestyle can help lower high blood pressure. You may need to change some of your habits. Lifestyle changes may include:  Following the DASH diet. This diet is high in fruits, vegetables, and whole grains. It is low in salt, red meat, and added sugars.  Keep your sodium intake below 2,300 mg per day.  Getting at least 30-45 minutes of aerobic exercise at least 4 times per week.  Losing weight if necessary.  Not smoking.  Limiting alcoholic beverages.  Learning ways to reduce stress. Your health care provider may prescribe medicine if lifestyle changes are not enough to get your blood pressure under control, and if one of the following is true:  You are 18-59 years of age and your systolic blood pressure is above 140.  You are 60 years of age or older, and your systolic blood pressure is above 150.  Your diastolic blood pressure is above 90.  You have diabetes, and your systolic blood pressure is over 140 or your diastolic blood pressure is over 90.  You have kidney disease and your blood pressure is above 140/90.  You have heart disease and your blood pressure is above 140/90. Your personal target blood pressure may vary depending on your medical conditions, your age, and other factors. HOME CARE INSTRUCTIONS    Have your blood pressure rechecked as directed by your health care provider.   Take medicines only as directed by your health care provider. Follow the directions carefully. Blood pressure medicines must be taken as prescribed. The medicine does not work as well when you skip doses. Skipping doses also puts you at risk for  problems.  Do not smoke.   Monitor your blood pressure at home as directed by your health care provider. SEEK MEDICAL CARE IF:   You think you are having a reaction to medicines taken.  You have recurrent headaches or feel dizzy.  You have swelling in your ankles.  You have trouble with your vision. SEEK IMMEDIATE MEDICAL CARE IF:  You develop a severe headache or confusion.  You have unusual weakness, numbness, or feel faint.  You have severe chest or abdominal pain.  You vomit repeatedly.  You have trouble breathing. MAKE SURE YOU:   Understand these instructions.  Will watch your condition.  Will get help right away if you are not doing well or get worse.   This information is not intended to replace advice given to you by your health care provider. Make sure you discuss any questions you have with your health care provider.   Document Released: 12/04/2005 Document Revised: 04/20/2015 Document Reviewed: 09/26/2013 Elsevier Interactive Patient Education 2016 [place]. Hyperglycemia Hyperglycemia occurs when the glucose (sugar) in your blood is too high. Hyperglycemia can happen for many reasons, but it most often happens to people who do not know they have diabetes or are not managing their diabetes properly.  CAUSES  Whether you have diabetes or not, there are other causes of hyperglycemia. Hyperglycemia can occur when you have diabetes, but it can also occur in other situations that you might not be as aware of, such as: Diabetes  If you have diabetes and are having problems controlling your blood glucose, hyperglycemia could occur because of some of the following reasons:  Not following your meal plan.  Not taking your diabetes medications or not taking it properly.  Exercising less or doing less activity than you normally do.  Being sick. Pre-diabetes  This cannot be ignored. Before people develop Type 2 diabetes, they almost always have  "pre-diabetes." This is when your blood glucose levels are higher than normal, but not yet high enough to be diagnosed as diabetes. Research has shown that some long-term damage to the body, especially the heart and circulatory system, may already be occurring during pre-diabetes. If you take action to manage your blood glucose when you have pre-diabetes, you may delay or prevent Type 2 diabetes from developing. Stress  If you have diabetes, you may be "diet" controlled or on oral medications or insulin to control your diabetes. However, you may find that your blood glucose is higher than usual in the hospital whether you have diabetes or not. This is often referred to as "stress hyperglycemia." Stress can elevate your blood glucose. This happens because of hormones put out by the body during times of stress. If stress has been the cause of your high blood glucose, it can be followed regularly by your caregiver. That way he/she can make sure your hyperglycemia does not continue to get worse or progress to diabetes. Steroids  Steroids are medications that act on the infection fighting system (immune system) to block inflammation or infection. One side effect can be a rise in blood glucose. Most people can produce enough extra insulin to allow for this rise, but for those  who cannot, steroids make blood glucose levels go even higher. It is not unusual for steroid treatments to "uncover" diabetes that is developing. It is not always possible to determine if the hyperglycemia will go away after the steroids are stopped. A special blood test called an A1c is sometimes done to determine if your blood glucose was elevated before the steroids were started. SYMPTOMS  Thirsty.  Frequent urination.  Dry mouth.  Blurred vision.  Tired or fatigue.  Weakness.  Sleepy.  Tingling in feet or leg. DIAGNOSIS  Diagnosis is made by monitoring blood glucose in one or all of the following ways:  A1c test. This  is a chemical found in your blood.  Fingerstick blood glucose monitoring.  Laboratory results. TREATMENT  First, knowing the cause of the hyperglycemia is important before the hyperglycemia can be treated. Treatment may include, but is not be limited to:  Education.  Change or adjustment in medications.  Change or adjustment in meal plan.  Treatment for an illness, infection, etc.  More frequent blood glucose monitoring.  Change in exercise plan.  Decreasing or stopping steroids.  Lifestyle changes. HOME CARE INSTRUCTIONS   Test your blood glucose as directed.  Exercise regularly. Your caregiver will give you instructions about exercise. Pre-diabetes or diabetes which comes on with stress is helped by exercising.  Eat wholesome, balanced meals. Eat often and at regular, fixed times. Your caregiver or nutritionist will give you a meal plan to guide your sugar intake.  Being at an ideal weight is important. If needed, losing as little as 10 to 15 pounds may help improve blood glucose levels. SEEK MEDICAL CARE IF:   You have questions about medicine, activity, or diet.  You continue to have symptoms (problems such as increased thirst, urination, or weight gain). SEEK IMMEDIATE MEDICAL CARE IF:   You are vomiting or have diarrhea.  Your breath smells fruity.  You are breathing faster or slower.  You are very sleepy or incoherent.  You have numbness, tingling, or pain in your feet or hands.  You have chest pain.  Your symptoms get worse even though you have been following your caregiver's orders.  If you have any other questions or concerns.   This information is not intended to replace advice given to you by your health care provider. Make sure you discuss any questions you have with your health care provider.   Document Released: 05/30/2001 Document Revised: 02/26/2012 Document Reviewed: 08/10/2015 Elsevier Interactive Patient Education [employer].

## 2016-03-27 NOTE — Telephone Encounter (Signed)
Pt is calling back because the new medication is not controlling her blood sugar. It is in the 200-400, she doesn't know what to do. Please call to discuss the options she has. jw

## 2016-03-27 NOTE — ED Provider Notes (Signed)
CSN: [id number]     Arrival date & time 03/27/16  1916 History   First MD Initiated Contact with Patient 03/27/16 2201     Chief Complaint  Patient presents with  . Hyperglycemia     (Consider location/radiation/quality/duration/timing/severity/associated sxs/prior Treatment) HPI Comments: Patient presents to the emergency department with chief complaints of hyperglycemia. She states that she had a recent change in her diabetes medication. She was changed from Janumet to Glyburide.  She states that the switch occurred because she was having GI upset. She states that since switching her blood sugars have not been as well controlled. She has follow-up with her primary care provider tomorrow, but states that she felt "fuzzy" tonight and new that she needed to get her blood sugar down. She also states that her blood pressure has been running high. She reports that she has been compliant in taking her amlodipine.  The history is provided by the patient. No language interpreter was used.    Past Medical History  Diagnosis Date  . Diabetes mellitus 2000    Never on insulin   . Hypertension 2000  . Arthritis   . Anemia 1970s    on iron   . ALLERGIC RHINITIS 02/19/2007    Qualifier: Diagnosis of  By: Lorne Skeens MD, VALENICA    . Cubital tunnel syndrome on right 10/31/2012    Secondary to old fracture. Evaluated by ortho. [facility] offered surgery but patient refused. Numbness and burning pain 4th and 5th digit.     History reviewed. No pertinent past surgical history. Family History  Problem Relation Age of Onset  . Early death Mother 40    shot   . Heart disease Father 42  . Diabetes Daughter   . Kidney disease Daughter     on dialysis   . Diabetes Maternal Aunt   . Cancer Paternal Uncle     Breast Cancer    Social History  Substance Use Topics  . Smoking status: Never Smoker   . Smokeless tobacco: Never Used  . Alcohol Use: No   OB History    No data available      Review of Systems  Constitutional: Negative for fever and chills.  Respiratory: Negative for shortness of breath.   Cardiovascular: Negative for chest pain.  Gastrointestinal: Negative for nausea, vomiting, diarrhea and constipation.  Genitourinary: Negative for dysuria.  All other systems reviewed and are negative.     Allergies  Levofloxacin  Home Medications   Prior to Admission medications   Medication Sig Start Date End Date Taking? Authorizing Provider  amLODipine (NORVASC) 10 MG tablet TAKE 1 TABLET BY MOUTH EVERY DAY 01/07/16  Yes Alyssa A Haney, MD  gabapentin (NEURONTIN) 100 MG capsule Take 1 capsule (100 mg total) by mouth 3 (three) times daily. 10/02/14  Yes Frazier Richards, MD  glyBURIDE (DIABETA) 5 MG tablet Take 1 tablet (5 mg total) by mouth daily with breakfast. Patient taking differently: Take 5 mg by mouth 2 (two) times daily with a meal.  03/14/16  Yes Alyssa A Haney, MD  Multiple Vitamin (MULTIVITAMIN WITH MINERALS) TABS tablet Take 1 tablet by mouth daily.   Yes Historical Provider, MD  omega-3 acid ethyl esters (LOVAZA) 1 g capsule Take 1 g by mouth daily.   Yes Historical Provider, MD  potassium chloride SA (K-DUR,KLOR-CON) 20 MEQ tablet Take 20 mEq by mouth daily.   Yes Historical Provider, MD  traZODone (DESYREL) 50 MG tablet Take 0.5-1 tablets (25-50 mg total)  by mouth at bedtime as needed for sleep. 10/07/15  Yes Alyssa A Haney, MD  glucose blood (ACCU-CHEK AVIVA PLUS) test strip Use as instructed 01/06/16   Alyssa A Haney, MD  glucose blood test strip Use as instructed 12/29/15   Veatrice Bourbon, MD  Lancets (ONETOUCH ULTRASOFT) lancets Use as instructed Patient not taking: Reported on 09/06/2015 08/27/15   Veatrice Bourbon, MD  loperamide (IMODIUM A-D) 2 MG tablet Take 1 tablet (2 mg total) by mouth 4 (four) times daily as needed for diarrhea or loose stools. 01/06/16   Alyssa A Haney, MD   BP 188/106 mmHg  Pulse 94  Temp(Src) 98.1 F (36.7 C) (Oral)  Resp 18   Ht 5\' 5"  (1.651 m)  Wt 100.699 kg  BMI 36.94 kg/m2  SpO2 96% Physical Exam  Constitutional: She is oriented to person, place, and time. She appears well-developed and well-nourished.  HENT:  Head: Normocephalic and atraumatic.  Eyes: Conjunctivae and EOM are normal. Pupils are equal, round, and reactive to light.  Neck: Normal range of motion. Neck supple.  Cardiovascular: Normal rate and regular rhythm.  Exam reveals no gallop and no friction rub.   No murmur heard. Pulmonary/Chest: Effort normal and breath sounds normal. No respiratory distress. She has no wheezes. She has no rales. She exhibits no tenderness.  Abdominal: Soft. Bowel sounds are normal. She exhibits no distension and no mass. There is no tenderness. There is no rebound and no guarding.  Musculoskeletal: Normal range of motion. She exhibits no edema or tenderness.  Neurological: She is alert and oriented to person, place, and time.  Skin: Skin is warm and dry.  Psychiatric: She has a normal mood and affect. Her behavior is normal. Judgment and thought content normal.  Nursing note and vitals reviewed.   ED Course  Procedures (including critical care time) Labs Review Labs Reviewed  BASIC METABOLIC PANEL - Abnormal; Notable for the following:    Glucose, Bld 434 (*)    BUN 22 (*)    All other components within normal limits  CBC - Abnormal; Notable for the following:    Hemoglobin 11.3 (*)    HCT 34.5 (*)    All other components within normal limits  URINALYSIS, ROUTINE W REFLEX MICROSCOPIC (NOT AT [facility]) - Abnormal; Notable for the following:    Specific Gravity, Urine 1.031 (*)    Glucose, UA >1000 (*)    Hgb urine dipstick TRACE (*)    Protein, ur 100 (*)    All other components within normal limits  URINE MICROSCOPIC-ADD ON - Abnormal; Notable for the following:    Squamous Epithelial / LPF 0-5 (*)    Bacteria, UA RARE (*)    All other components within normal limits  CBG MONITORING, ED - Abnormal;  Notable for the following:    Glucose-Capillary 408 (*)    All other components within normal limits  CBG MONITORING, ED - Abnormal; Notable for the following:    Glucose-Capillary 313 (*)    All other components within normal limits      MDM   Final diagnoses:  Hyperglycemia  Elevated blood pressure    Patient with concerns over hyperglycemia and hypertension. Discussed that she will likely need to follow-up with her primary care doctor tomorrow about these concerns. Her laboratory workup is quite reassuring tonight. I will get her blood sugar a little better controlled this evening with fluids and a small dose of insulin. We'll also attempt to get better blood  pressure control.  11:57 PM Glucose is trending down nicely, blood pressure is also trending down nicely. Patient feels much better. She requests to be restarted on her Janumet, and wishes to discontinue the glyburide.  I advised her that I would rather her PCP make these changes.  She states that she is concerned about not being able to see her PCP tomorrow.  Will make the switch back per patient request and recommend close follow-up.    Montine Circle, PA-C 03/27/16 2358  Virgel Manifold, MD 03/28/16 1434

## 2016-03-27 NOTE — ED Notes (Signed)
Pt from home with c/o high blood sugar. Pt states that about 2 weeks ago her medications were changed because her PO medication for diabetes was giving her diarrhea. She states that ever since then, she has had high blood sugar. Pt states her CBG about 2 hours ago was 518 when she checked it at home. Pt denies pain anywhere, just lethargy

## 2016-03-28 NOTE — ED Notes (Signed)
Patient was alert, oriented and stable upon discharge. RN went over AVS and patient had no further questions.  

## 2016-03-28 NOTE — Telephone Encounter (Signed)
Referral placed at following visit on 03/14/16.  Will close encounter. Fleeger, Salome Spotted, CMA

## 2016-04-01 ENCOUNTER — Other Ambulatory Visit: Payer: Self-pay | Admitting: Student

## 2016-04-03 ENCOUNTER — Telehealth: Payer: Self-pay | Admitting: Student

## 2016-04-03 NOTE — Telephone Encounter (Signed)
Pt called because she needs a refill on her Amlodipine. Also the ER adjusted her Jamumet to a lower dose and this is really working out very good for her. She would like a new prescription of this sent in [place] 50-500MG  to her pharmacy. jw

## 2016-04-05 ENCOUNTER — Telehealth: Payer: Self-pay | Admitting: Student

## 2016-04-05 MED ORDER — SITAGLIPTIN PHOS-METFORMIN HCL 50-500 MG PO TABS: 1.0000 | ORAL_TABLET | Freq: Two times a day (BID) | ORAL | Status: DC

## 2016-04-05 NOTE — Telephone Encounter (Signed)
Janumet ordered and sent to pharmacy

## 2016-04-10 ENCOUNTER — Other Ambulatory Visit: Payer: Self-pay | Admitting: Student

## 2016-04-18 ENCOUNTER — Telehealth: Payer: Self-pay | Admitting: Student

## 2016-04-18 NOTE — Telephone Encounter (Signed)
Pt is wanting to talk to a nurse about her BP medication. She said that her blood pressure is being low all the time. She is wondering if her medication needs to be changed. Please call to discuss. jw

## 2016-04-18 NOTE — Telephone Encounter (Signed)
LM for pt to return my call. Page Nicki Reaper, CMA

## 2016-04-25 ENCOUNTER — Ambulatory Visit (INDEPENDENT_AMBULATORY_CARE_PROVIDER_SITE_OTHER): Payer: Medicare Other | Admitting: Student

## 2016-04-25 ENCOUNTER — Encounter: Payer: Self-pay | Admitting: Student

## 2016-04-25 VITALS — BP 160/80 | HR 87 | Temp 98.8°F | Wt 222.2 lb

## 2016-04-25 DIAGNOSIS — I10 Essential (primary) hypertension: Secondary | ICD-10-CM | POA: Diagnosis not present

## 2016-04-25 DIAGNOSIS — H1131 Conjunctival hemorrhage, right eye: Secondary | ICD-10-CM | POA: Diagnosis not present

## 2016-04-25 MED ORDER — ASPIRIN EC 81 MG PO TBEC: 81.0000 mg | DELAYED_RELEASE_TABLET | Freq: Every day | ORAL | Status: AC

## 2016-04-25 MED ORDER — LISINOPRIL 20 MG PO TABS: 20.0000 mg | ORAL_TABLET | Freq: Every day | ORAL | Status: DC

## 2016-04-25 NOTE — Patient Instructions (Signed)
Follow up on 5/12 for Blood pressure check If you started lightheaded, stop the blood pressure medication and call the office  If you have questions or concerns, call the office at [phone]

## 2016-04-27 ENCOUNTER — Ambulatory Visit: Payer: Medicare Other | Admitting: Family Medicine

## 2016-04-27 DIAGNOSIS — H1131 Conjunctival hemorrhage, right eye: Secondary | ICD-10-CM | POA: Insufficient documentation

## 2016-04-27 NOTE — Assessment & Plan Note (Signed)
Likely in setting of poorly controlled blood pressure in setting of medication on compliance - Lisinopril 20 started today in addition to current Norvasc 10 - BP check in 1 week - strict return precautions discussed

## 2016-04-27 NOTE — Assessment & Plan Note (Signed)
Lisinopril ordered for today to be taken with Norvasc - Recheck BP in one week - hypotension precautions given

## 2016-04-27 NOTE — Progress Notes (Signed)
   Subjective:    Patient ID: Veronica Simmons, female    DOB: [birth year], 65 y.o.   MRN: [id number]  Same Day: CC: blood shot eye, elevated blood pressure  HPI: 65 y/o F presenting for blood red right eye that she noted yesterday, and blood pressure follo wup  Subconjunctival hemorrhage - Went to her ophthalmologist yesterday after she first noted her right eye was red - work up at that time was negative and he felt this occurred due to elevated blood pressure - she had just finished brushing her teeth and went to brush her hair when she noted this - no eye pain, vision changes, or headache - at her ophthalmologist office she was noted to be hypertensive to systolic of [id number] reportedly - she was previously managed on Novasc and an ACE-i but she stopped the ACE  Smoking status reviewed  Review of Systems  Denies chest pain, SOB, headache, changes in vision    Objective:  BP 160/80 mmHg  Pulse 87  Temp(Src) 98.8 F (37.1 C) (Oral)  Wt 100.789 kg (222 lb 3.2 oz)  SpO2 96% Vitals and nursing note reviewed  General: NAD HEENT: right conjunctiva was blood beneath it, no swelling of the eye, EOMI bilaterally, PERRL bilaterally, no retinal hemorrhage on fundoscopic exam Cardiac: RRR,  Respiratory: CTAB, normal effort Neuro: alert and oriented, no focal deficits   Assessment & Plan:    Subconjunctival hemorrhage of right eye Likely in setting of poorly controlled blood pressure in setting of medication on compliance - Lisinopril 20 started today in addition to current Norvasc 10 - BP check in 1 week - strict return precautions discussed  HYPERTENSION, BENIGN SYSTEMIC Lisinopril ordered for today to be taken with Norvasc - Recheck BP in one week - hypotension precautions given     Veronica Pe A. Lincoln [name] MD, [facility] Resident PGY-2 Pager [phone]

## 2016-04-28 ENCOUNTER — Ambulatory Visit (INDEPENDENT_AMBULATORY_CARE_PROVIDER_SITE_OTHER): Payer: Medicare Other | Admitting: *Deleted

## 2016-04-28 VITALS — BP 160/78 | HR 83

## 2016-04-28 DIAGNOSIS — Z0130 Encounter for examination of blood pressure without abnormal findings: Secondary | ICD-10-CM

## 2016-04-28 DIAGNOSIS — Z136 Encounter for screening for cardiovascular disorders: Secondary | ICD-10-CM

## 2016-04-28 DIAGNOSIS — I10 Essential (primary) hypertension: Secondary | ICD-10-CM

## 2016-04-28 NOTE — Progress Notes (Signed)
   Patient ID: Veronica Simmons, female   DOB: 1951-05-23, 65 y.o.   MRN: [id number]   Patient in nurse clinic for repeat blood pressure check.  Patient stated she just took her blood pressure medication about 30 minutes prior to appointment time.  Patient denies any symptoms today. Left eye is still red, per patient a lot better and she is applying the eye drops as ordered.  Patient is trying to exercise and change her diet.  Offered patient to speak with Leroy Sea, IC regarding multiple screening that would help make her health better, but declined for today.  Will forward to PCP.  Derl Barrow, RN   Today's Vitals   04/28/16 1149 04/28/16 1157  BP: 170/80 160/78  Pulse: 85 83  SpO2: 96% 95%  PainSc:  0-No pain

## 2016-05-09 ENCOUNTER — Telehealth: Payer: Self-pay | Admitting: *Deleted

## 2016-05-09 NOTE — Telephone Encounter (Signed)
Received faxed request from fax # [phone] for medical necessity for Back Support Form placed in Dr. Etta Grandchild box for review. Velora Heckler, RN

## 2016-05-11 ENCOUNTER — Ambulatory Visit (INDEPENDENT_AMBULATORY_CARE_PROVIDER_SITE_OTHER): Payer: Medicare Other | Admitting: Family Medicine

## 2016-05-11 ENCOUNTER — Encounter: Payer: Self-pay | Admitting: Family Medicine

## 2016-05-11 VITALS — BP 140/70 | HR 85 | Temp 98.1°F | Wt 219.0 lb

## 2016-05-11 DIAGNOSIS — M7989 Other specified soft tissue disorders: Secondary | ICD-10-CM

## 2016-05-11 NOTE — Progress Notes (Signed)
   Subjective:    Patient ID: Veronica Simmons, female    DOB: Dec 14, 1951, 65 y.o.   MRN: [id number]  Seen for Same day visit for   CC: leg swelling   She returned on Tuesday, She has returned on a train ride that was 20 hours long.  This is occuring in the left leg.  There was some pain but not now.  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  No prior history of blood clots  Has tried icy hot.  Has been walking to get rid of the swelling.  Denies any new medications.  Has been elevating them at night which improves the swelling.   PMH: HTN, HLD, obsesity  SH: no tobacco or alcohol  FH: non contributory   Review of Systems   See HPI for ROS. Objective:  BP 140/70 mmHg  Pulse 85  Temp(Src) 98.1 F (36.7 C) (Oral)  Wt 219 lb (99.338 kg)  General: NAD,  Cardiac: RRR, normal heart sounds, no murmurs. +1 pitting edema bilaterally to mid tibia Respiratory: CTAB, normal effort Extremities: . WWP. MSK: no pain to palpation of left or right gastrocnemius, no redness of gastrocnemius, negative Homan's sign bilaterally  Skin: warm and dry, no rashes noted    Assessment & Plan:   Leg swelling Most likely this is dependent edema  Wells score of 1.  Exam is not suggestive of DVT  No history of heart failure and no crackles  She does have some anemia but mild which could contribute  - advised elevation and compression  - if still occurring may need compression hoses or further investigation.

## 2016-05-11 NOTE — Patient Instructions (Addendum)
Thank you for coming in,   Most likely your swelling is from venous insufficiency.   If it were a blood clot, one leg would be significantly larger and there would redness and pain associated with it.   Compression stockings and elevating your legs are the way to reduce the swelling.   Please bring all of your medications with you to each visit.   Health maintenance items that are due.  Health Maintenance  Topic Date Due  .  Hepatitis C: One time screening is recommended by Center for Disease Control  (CDC) for  adults born from [birth year] through 1966.   12-11-1951  . HIV Screening  12/07/1965  . Colon Cancer Screening  12/07/2000  . Shingles Vaccine  12/07/2010  . Pap Smear  01/19/2011  . Tetanus Vaccine  06/18/2015  . DEXA scan (bone density measurement)  12/08/2015  . Pneumonia vaccines (1 of 2 - PCV13) 12/08/2015  . Hemoglobin A1C  04/24/2016  . Complete foot exam   06/27/2016  . Flu Shot  07/18/2016  . Eye exam for diabetics  07/30/2016  . Mammogram  07/01/2017     Sign up for My Chart to have easy access to your labs results, and communication with your Primary care physician   Please feel free to call with any questions or concerns at any time, at [phone]. --Dr. Raeford Razor   Venous Stasis or Chronic Venous Insufficiency Chronic venous insufficiency, also called venous stasis, is a condition that affects the veins in the legs. The condition prevents blood from being pumped through these veins effectively. Blood may no longer be pumped effectively from the legs back to the heart. This condition can range from mild to severe. With proper treatment, you should be able to continue with an active life. CAUSES  Chronic venous insufficiency occurs when the vein walls become stretched, weakened, or damaged or when valves within the vein are damaged. Some common causes of this include:  High blood pressure inside the veins (venous hypertension).  Increased blood pressure in the leg  veins from long periods of sitting or standing.  A blood clot that blocks blood flow in a vein (deep vein thrombosis).  Inflammation of a superficial vein (phlebitis) that causes a blood clot to form. RISK FACTORS Various things can make you more likely to develop chronic venous insufficiency, including:  Family history of this condition.  Obesity.  Pregnancy.  Sedentary lifestyle.  Smoking.  Jobs requiring long periods of standing or sitting in one place.  Being a certain age. Women in their 70s and 27s and men in their 22s are more likely to develop this condition. SIGNS AND SYMPTOMS  Symptoms may include:   Varicose veins.  Skin breakdown or ulcers.  Reddened or discolored skin on the leg.  Brown, smooth, tight, and painful skin just above the ankle, usually on the inside surface (lipodermatosclerosis).  Swelling. DIAGNOSIS  To diagnose this condition, your health care provider will take a medical history and do a physical exam. The following tests may be ordered to confirm the diagnosis:  Duplex ultrasound--A procedure that produces a picture of a blood vessel and nearby organs and also provides information on blood flow through the blood vessel.  Plethysmography--A procedure that tests blood flow.  A venogram, or venography--A procedure used to look at the veins using X-ray and dye. TREATMENT The goals of treatment are to help you return to an active life and to minimize pain or disability. Treatment will depend on the  severity of the condition. Medical procedures may be needed for severe cases. Treatment options may include:   Use of compression stockings. These can help with symptoms and lower the chances of the problem getting worse, but they do not cure the problem.  Sclerotherapy--A procedure involving an injection of a material that "dissolves" the damaged veins. Other veins in the network of blood vessels take over the function of the damaged  veins.  Surgery to remove the vein or cut off blood flow through the vein (vein stripping or laser ablation surgery).  Surgery to repair a valve. HOME CARE INSTRUCTIONS   Wear compression stockings as directed by your health care provider.  Only take over-the-counter or prescription medicines for pain, discomfort, or fever as directed by your health care provider.  Follow up with your health care provider as directed. SEEK MEDICAL CARE IF:   You have redness, swelling, or increasing pain in the affected area.  You see a red streak or line that extends up or down from the affected area.  You have a breakdown or loss of skin in the affected area, even if the breakdown is small.  You have an injury to the affected area. SEEK IMMEDIATE MEDICAL CARE IF:   You have an injury and open wound in the affected area.  Your pain is severe and does not improve with medicine.  You have sudden numbness or weakness in the foot or ankle below the affected area, or you have trouble moving your foot or ankle.  You have a fever or persistent symptoms for more than 2-3 days.  You have a fever and your symptoms suddenly get worse. MAKE SURE YOU:   Understand these instructions.  Will watch your condition.  Will get help right away if you are not doing well or get worse.   This information is not intended to replace advice given to you by your health care provider. Make sure you discuss any questions you have with your health care provider.   Document Released: 04/09/2007 Document Revised: 09/24/2013 Document Reviewed: 08/11/2013 Elsevier Interactive Patient Education [employer].

## 2016-05-12 DIAGNOSIS — M7989 Other specified soft tissue disorders: Secondary | ICD-10-CM | POA: Insufficient documentation

## 2016-05-12 NOTE — Assessment & Plan Note (Signed)
Most likely this is dependent edema  Wells score of 1.  Exam is not suggestive of DVT  No history of heart failure and no crackles  She does have some anemia but mild which could contribute  - advised elevation and compression  - if still occurring may need compression hoses or further investigation.

## 2016-05-25 ENCOUNTER — Telehealth: Payer: Self-pay | Admitting: *Deleted

## 2016-05-25 DIAGNOSIS — M25531 Pain in right wrist: Secondary | ICD-10-CM

## 2016-05-25 NOTE — Telephone Encounter (Signed)
Pt calling in wanting to be referred to [facility] [place] for [place] hand. Deseree Kennon Holter, CMA

## 2016-05-26 NOTE — Telephone Encounter (Signed)
Referral made 

## 2016-06-01 ENCOUNTER — Other Ambulatory Visit: Payer: Self-pay | Admitting: Family Medicine

## 2016-06-01 DIAGNOSIS — Z1231 Encounter for screening mammogram for malignant neoplasm of breast: Secondary | ICD-10-CM

## 2016-06-08 ENCOUNTER — Ambulatory Visit: Payer: Medicare Other | Attending: Family Medicine | Admitting: Physical Therapy

## 2016-06-08 ENCOUNTER — Encounter: Payer: Self-pay | Admitting: Physical Therapy

## 2016-06-08 DIAGNOSIS — S5401XD Injury of ulnar nerve at forearm level, right arm, subsequent encounter: Secondary | ICD-10-CM | POA: Insufficient documentation

## 2016-06-08 DIAGNOSIS — M25531 Pain in right wrist: Secondary | ICD-10-CM | POA: Diagnosis not present

## 2016-06-08 DIAGNOSIS — M2560 Stiffness of unspecified joint, not elsewhere classified: Secondary | ICD-10-CM | POA: Diagnosis not present

## 2016-06-08 DIAGNOSIS — X58XXXD Exposure to other specified factors, subsequent encounter: Secondary | ICD-10-CM | POA: Insufficient documentation

## 2016-06-08 DIAGNOSIS — M25521 Pain in right elbow: Secondary | ICD-10-CM | POA: Diagnosis not present

## 2016-06-08 DIAGNOSIS — M25621 Stiffness of right elbow, not elsewhere classified: Secondary | ICD-10-CM | POA: Insufficient documentation

## 2016-06-08 DIAGNOSIS — M25541 Pain in joints of right hand: Secondary | ICD-10-CM | POA: Diagnosis not present

## 2016-06-08 NOTE — Therapy (Signed)
[place] [address] [place], [place], [address] Phone: [phone]   Fax:  [phone]  Physical Therapy Evaluation  Patient Details  Name: Veronica Simmons MRN: [id number] Date of Birth: [birth year] Referring Provider: Lincoln [name]  Encounter Date: 06/08/2016      PT End of Session - 06/08/16 0951    Visit Number 1   Date for PT Re-Evaluation 08/08/16   PT Start Time 0926   PT Stop Time [id number]   PT Time Calculation (min) 48 min   Activity Tolerance Patient tolerated treatment well   Behavior During Therapy [facility] for tasks assessed/performed      Past Medical History  Diagnosis Date  . Diabetes mellitus 2000    Never on insulin   . Hypertension 2000  . Arthritis   . Anemia 1970s    on iron   . ALLERGIC RHINITIS 02/19/2007    Qualifier: Diagnosis of  By: Lorne Skeens MD, VALENICA    . Cubital tunnel syndrome on right 10/31/2012    Secondary to old fracture. Evaluated by ortho. [facility] offered surgery but patient refused. Numbness and burning pain 4th and 5th digit.      History reviewed. No pertinent past surgical history.  There were no vitals filed for this visit.       Subjective Assessment - 06/08/16 0929    Subjective Patient reports right upper arm pain about 5-6 years. She reports that she broke the arm about 50 years ago.  She reports that over the past 2 years she has had more pain in the right hand and difficulty with ADL's.  I saw her about 4 months ago, but due to her having Medicaid she only was able to be seen for 1 visit..  She had a brace made by an OT in January she reports that it does not helping the "clawing up" of the hand   Patient Stated Goals have less pain and use the right arm   Currently in Pain? Yes   Pain Score 3    Pain Location Hand   Pain Orientation Right   Pain Descriptors / Indicators Aching;Tightness   Pain Type Chronic pain   Pain Onset More than a month ago   Pain Frequency Intermittent   Aggravating Factors  difficulty with all ADL's   Pain Relieving Factors She reports that she bought an arthritis glove that helps some.   Effect of Pain on Daily Activities she reports difficulty cooking and cleaning            [facility] PT Assessment - 06/08/16 0001    Assessment   Medical Diagnosis right ulnar nerve pain   Referring Provider Haney   Onset Date/Surgical Date 01/14/16   Prior Therapy had OT in the past year   Precautions   Precautions None   Balance Screen   Has the patient fallen in the past 6 months No   Has the patient had a decrease in activity level because of a fear of falling?  No   Is the patient reluctant to leave their home because of a fear of falling?  No   Home Environment   Additional Comments does housework, some yardwork, does care for her adult daughter that is having health issues   Prior Function   Level of Independence Independent   Leisure no exercise   Posture/Postural Control   Posture Comments fwd head, right hand is held in a claw position with flexion at the  PIP joints   AROM   Overall AROM Comments AROM of the right elbow 40 degrees from full extension, supination to 50 degrees, right fifth finger PIP is held in 70 degrees flexion with the DIP held in 45 degrees flexion, 4th finger PIP is at 60 degrees flexion, passively I can get her straight,    Strength   Overall Strength Comments right grip strength 35#, left 55#   Flexibility   Soft Tissue Assessment /Muscle Length --  + neural tension of the right arm   Palpation   Palpation comment tightness in the palmar aspect of the 3rd, 4th and 5th digits, she has muscle wasting of the thenar eminence                   [facility] Adult PT Treatment/Exercise - 06/08/16 0001    Modalities   Modalities Paraffin;Moist Heat;Electrical Stimulation   Moist Heat Therapy   Number Minutes Moist Heat 15 Minutes   Moist Heat Location Elbow   Electrical Stimulation    Electrical Stimulation Location right elbow area   Electrical Stimulation Action IFC   Electrical Stimulation Parameters sitting   Electrical Stimulation Goals Pain   RUE Paraffin   Number Minutes Paraffin 15 Minutes   RUE Paraffin Location Hand                PT Education - 06/08/16 0951    Education provided Yes   Education Details stretches for fingers, wrist and elbow, all extension   Person(s) Educated Patient   Methods Explanation;Demonstration;Handout   Comprehension Verbalized understanding          PT Short Term Goals - 06/08/16 0955    PT SHORT TERM GOAL #1   Title independent with intiial HEP   Time 2   Period Weeks   Status New           PT Long Term Goals - 06/08/16 0955    PT LONG TERM GOAL #1   Title report pain and tingling decreased 50%   Time 8   Period Weeks   Status New   PT LONG TERM GOAL #2   Title report able to do her hair, she currently reports that she has to have someone do it for her   Time 8   Period Weeks   Status New   PT LONG TERM GOAL #3   Title increase right grip strength to 45#   Time 8   Period Weeks   Status New   PT LONG TERM GOAL #4   Title be able to extend all fingers to neutral   Time 8   Period Weeks   Status New               Plan - 06/08/16 [id number]    Clinical Impression Statement Patient with reports of ulnar nerve impingement, the 4th and 5th digits draw into flexion significantly at rest, the 2nd and 3rd fingers do as well but not to the extent of the 4th and 5th.  She reports pain in the right elbow, wrist and fingers.  She reports that over the past year she has lost some function with difficulty doing hair, dressing and cooking   Rehab Potential Good   PT Frequency 2x / week   PT Duration 8 weeks   PT Treatment/Interventions ADLs/Self Care Home Management;Electrical Stimulation;Moist Heat;Therapeutic exercise;Therapeutic activities;Parrafin;Ultrasound;Neuromuscular  re-education;Patient/family education;Manual techniques   PT Next Visit Plan start PROM of the elbow, wrist and fingers, add exercises  but avoid increasing flexion of the fingers   Consulted and Agree with Plan of Care Patient      Patient will benefit from skilled therapeutic intervention in order to improve the following deficits and impairments:  Decreased coordination, Decreased range of motion, Decreased strength, Increased fascial restricitons, Increased muscle spasms, Impaired flexibility, Pain, Impaired UE functional use  Visit Diagnosis: Pain in right wrist - Plan: PT plan of care cert/re-cert  Pain in right elbow - Plan: PT plan of care cert/re-cert  Stiffness of right elbow, not elsewhere classified - Plan: PT plan of care cert/re-cert      G-Codes - [id number]    Functional Assessment Tool Used foto 67% limitation   Functional Limitation Other PT primary   Other PT Primary Current Status [id number]) At least 60 percent but less than 80 percent impaired, limited or restricted   Other PT Primary Goal Status [id number]) At least 40 percent but less than 60 percent impaired, limited or restricted       Problem List Patient Active Problem List   Diagnosis Date Noted  . Leg swelling 05/12/2016  . Subconjunctival hemorrhage of right eye 04/27/2016  . Diarrhea 01/09/2016  . Health care maintenance 06/28/2015  . Anemia 12/21/2014  . Palpitations 01/07/2014  . Recurrent yeast vaginitis 11/05/2013  . Recurrent genital herpes 07/17/2013  . Hot flashes not due to menopause 11/27/2012  . Cubital tunnel syndrome on right 10/31/2012  . Type 2 diabetes, controlled, with neuropathy ([place]) 02/14/2007  . HYPERLIPIDEMIA 02/14/2007  . OBESITY, NOS 02/14/2007  . HYPERTENSION, BENIGN SYSTEMIC 02/14/2007  . ARTHRITIS 02/14/2007  . Insomnia 02/14/2007    Sumner Boast., PT 06/08/2016, 10:07 AM  [address]  [place], [place], [address] Phone: [phone]   Fax:  [phone]  Name: Veronica Simmons MRN: [id number] Date of Birth: [birth year]

## 2016-06-15 ENCOUNTER — Telehealth: Payer: Self-pay | Admitting: *Deleted

## 2016-06-15 ENCOUNTER — Ambulatory Visit: Payer: Medicare Other | Admitting: Physical Therapy

## 2016-06-15 ENCOUNTER — Encounter: Payer: Self-pay | Admitting: Physical Therapy

## 2016-06-15 ENCOUNTER — Telehealth: Payer: Self-pay | Admitting: Student

## 2016-06-15 DIAGNOSIS — M25521 Pain in right elbow: Secondary | ICD-10-CM

## 2016-06-15 DIAGNOSIS — M2560 Stiffness of unspecified joint, not elsewhere classified: Secondary | ICD-10-CM | POA: Diagnosis not present

## 2016-06-15 DIAGNOSIS — M25541 Pain in joints of right hand: Secondary | ICD-10-CM | POA: Diagnosis not present

## 2016-06-15 DIAGNOSIS — S5401XD Injury of ulnar nerve at forearm level, right arm, subsequent encounter: Secondary | ICD-10-CM | POA: Diagnosis not present

## 2016-06-15 DIAGNOSIS — M25621 Stiffness of right elbow, not elsewhere classified: Secondary | ICD-10-CM

## 2016-06-15 DIAGNOSIS — M25531 Pain in right wrist: Secondary | ICD-10-CM | POA: Diagnosis not present

## 2016-06-15 MED ORDER — TRAZODONE HCL 100 MG PO TABS: ORAL_TABLET | ORAL | Status: DC

## 2016-06-15 NOTE — Therapy (Signed)
[place], [place], [address] Phone: [phone]   Fax:  [phone]  Physical Therapy Treatment  Patient Details  Name: Veronica Simmons MRN: [id number] Date of Birth: 1951/04/20 Referring Provider: Lincoln [name]  Encounter Date: 06/15/2016      PT End of Session - 06/15/16 0920    Visit Number 2   Date for PT Re-Evaluation 08/08/16   Authorization Type medicad   PT Start Time 0845   PT Stop Time 0935   PT Time Calculation (min) 50 min   Activity Tolerance Patient tolerated treatment well   Behavior During Therapy [facility] for tasks assessed/performed      Past Medical History  Diagnosis Date  . Diabetes mellitus 2000    Never on insulin   . Hypertension 2000  . Arthritis   . Anemia 1970s    on iron   . ALLERGIC RHINITIS 02/19/2007    Qualifier: Diagnosis of  By: Lorne Skeens MD, VALENICA    . Cubital tunnel syndrome on right 10/31/2012    Secondary to old fracture. Evaluated by ortho. [facility] offered surgery but patient refused. Numbness and burning pain 4th and 5th digit.      History reviewed. No pertinent past surgical history.  There were no vitals filed for this visit.      Subjective Assessment - 06/15/16 0845    Subjective Pt reports that some days are good some are bad.    Currently in Pain? Yes   Pain Score 6    Pain Location Hand   Pain Orientation Right                         [facility] Adult PT Treatment/Exercise - 06/15/16 0001    Exercises   Exercises Shoulder;Elbow;Wrist;Hand   Shoulder Exercises: Standing   Extension 10 reps;Theraband  x2   Theraband Level (Shoulder Extension) Level 1 (Yellow)   Row Both;10 reps;Theraband  x2   Other Standing Exercises Standing forward reaches with yellow ball 2x10; Triceps extensions 10 lb 2x10   Other Standing Exercises Seated bent over rows 2lb 2x10; Seated front raises 2lb 2x10   Shoulder Exercises:  ROM/Strengthening   UBE (Upper Arm Bike) L1 53frd/3rev   Modalities   Modalities Paraffin;Moist Heat;Electrical Stimulation   Moist Heat Therapy   Number Minutes Moist Heat 15 Minutes   Moist Heat Location Elbow   Electrical Stimulation   Electrical Stimulation Location right elbow area   Electrical Stimulation Action IFC   Electrical Stimulation Parameters sitting   Electrical Stimulation Goals Pain   RUE Paraffin   Number Minutes Paraffin 15 Minutes   RUE Paraffin Location Hand   Manual Therapy   Manual Therapy Passive ROM   Passive ROM R elbow and hand                   PT Short Term Goals - 06/08/16 0955    PT SHORT TERM GOAL #1   Title independent with intiial HEP   Time 2   Period Weeks   Status New           PT Long Term Goals - 06/08/16 0955    PT LONG TERM GOAL #1   Title report pain and tingling decreased 50%   Time 8   Period Weeks   Status New   PT LONG TERM GOAL #2   Title report able to do her hair, she currently reports  that she has to have someone do it for her   Time 8   Period Weeks   Status New   PT LONG TERM GOAL #3   Title increase right grip strength to 45#   Time 8   Period Weeks   Status New   PT LONG TERM GOAL #4   Title be able to extend all fingers to neutral   Time 8   Period Weeks   Status New               Plan - 06/15/16 0920    Clinical Impression Statement Pt tolerated a mild progression to exercises this date and reports no limitations at home. Pt does reports some pain in the R triceps area.    Rehab Potential Good   PT Frequency 2x / week   PT Duration 8 weeks   PT Treatment/Interventions ADLs/Self Care Home Management;Electrical Stimulation;Moist Heat;Therapeutic exercise;Therapeutic activities;Parrafin;Ultrasound;Neuromuscular re-education;Patient/family education;Manual techniques   PT Next Visit Plan start PROM of the elbow, wrist and fingers, add exercises but avoid increasing flexion of the  fingers      Patient will benefit from skilled therapeutic intervention in order to improve the following deficits and impairments:  Decreased coordination, Decreased range of motion, Decreased strength, Increased fascial restricitons, Increased muscle spasms, Impaired flexibility, Pain, Impaired UE functional use  Visit Diagnosis: Stiffness of right elbow, not elsewhere classified  Injury of right ulnar nerve, subsequent encounter  Joint pain in fingers of right hand  Stiffness of multiple joints  Pain in right wrist  Pain in right elbow     Problem List Patient Active Problem List   Diagnosis Date Noted  . Leg swelling 05/12/2016  . Subconjunctival hemorrhage of right eye 04/27/2016  . Diarrhea 01/09/2016  . Health care maintenance 06/28/2015  . Anemia 12/21/2014  . Palpitations 01/07/2014  . Recurrent yeast vaginitis 11/05/2013  . Recurrent genital herpes 07/17/2013  . Hot flashes not due to menopause 11/27/2012  . Cubital tunnel syndrome on right 10/31/2012  . Type 2 diabetes, controlled, with neuropathy ([place]) 02/14/2007  . HYPERLIPIDEMIA 02/14/2007  . OBESITY, NOS 02/14/2007  . HYPERTENSION, BENIGN SYSTEMIC 02/14/2007  . ARTHRITIS 02/14/2007  . Insomnia 02/14/2007    Scot Jun, PTA  06/15/2016, 9:25 AM  [address] [place], [place], [address] Phone: [phone]   Fax:  [phone]  Name: Veronica Simmons MRN: [id number] Date of Birth: Apr 23, 1951

## 2016-06-15 NOTE — Telephone Encounter (Signed)
Patient states trazadone is not working to help her sleep at night, wants to know if she can increase her dose or if this can be changed to something else.

## 2016-06-15 NOTE — Telephone Encounter (Signed)
Increased trazodone to 100 qHS as needed for sleep. Please inform pateint

## 2016-06-15 NOTE — Telephone Encounter (Signed)
Will forward to MD to advise. Jazmin Hartsell,CMA  

## 2016-06-15 NOTE — Telephone Encounter (Signed)
Patient is aware of medication increase. Veronica Simmons,CMA

## 2016-06-19 ENCOUNTER — Encounter: Payer: Self-pay | Admitting: Family Medicine

## 2016-06-19 ENCOUNTER — Ambulatory Visit (INDEPENDENT_AMBULATORY_CARE_PROVIDER_SITE_OTHER): Payer: Medicare Other | Admitting: Family Medicine

## 2016-06-19 VITALS — BP 152/69 | HR 89 | Temp 97.7°F | Wt 222.0 lb

## 2016-06-19 DIAGNOSIS — L989 Disorder of the skin and subcutaneous tissue, unspecified: Secondary | ICD-10-CM | POA: Diagnosis not present

## 2016-06-19 MED ORDER — LIDOCAINE 5 % EX OINT: 1.0000 "application " | TOPICAL_OINTMENT | Freq: Three times a day (TID) | CUTANEOUS | Status: DC | PRN

## 2016-06-19 MED ORDER — BACITRACIN 500 UNIT/GM EX OINT: 1.0000 "application " | TOPICAL_OINTMENT | Freq: Two times a day (BID) | CUTANEOUS | Status: DC

## 2016-06-19 NOTE — Progress Notes (Signed)
   HPI  CC: Skin lesion Hard knot under skin. Located on the right thigh, near the gluteus. Sore, somewhat painful. Seems irritated but no injury. Never had anything like this in the past. No bleeding. No swelling. Tenderness moderate with palpation directly on lesion. No evidence of discharge. No fevers, chills, rash.  Review of Systems   See HPI for ROS. All other systems reviewed and are negative.  CC, SH/smoking status, and VS noted  Objective: BP 152/69 mmHg  Pulse 89  Temp(Src) 97.7 F (36.5 C) (Oral)  Wt 222 lb (100.699 kg) Gen: NAD, alert, cooperative, and pleasant. CV: RRR, no murmur Resp: CTAB, no wheezes, non-labored Integument: 1 x 1 cm skin lesion noted on the proximal medial right thigh below the gluteal cleft. No evidence of drainage. No surrounding erythema or induration. Central keratinized region present. Tenderness experienced with manipulation of this area.   Assessment and plan:  Skin lesion of right lower limb 1 x 1 cm skin lesion without evidence of infection. Tenderness only noted with direct palpation of the lesion itself. Central firm/keratinized region suggestive of a scab. Lesion is likely caused by friction and/or a small skin tear. (Appearance is not consistent with genital herpes as pts herpes status is noted) - Bacitracin ointment provided in clinic. Apply twice a day with over-the-counter Band-Aid to help protect against friction. - Encouraged keeping area clean and dry and avoiding prolonged moisture or hot restrictive clothing. - Topical lidocaine ointment also provided to help with pain.    Meds ordered this encounter  Medications  . lidocaine (XYLOCAINE) 5 % ointment    Sig: Apply 1 application topically 3 (three) times daily as needed.    Dispense:  30 g    Refill:  0  . bacitracin 500 UNIT/GM ointment    Sig: Apply 1 application topically 2 (two) times daily.    Dispense:  15 g    Refill:  0     Elberta Leatherwood, MD,MS,  PGY2 06/19/2016  6:56 PM

## 2016-06-19 NOTE — Patient Instructions (Addendum)
It was a pleasure seeing you today in our clinic. Today we discussed your right thigh sore. Here is the treatment plan we have discussed and agreed upon together:  '- Apply the bacitracin ointment twice a day to the affected area with a Band-Aid to protect this area. - You may apply some of the lidocaine ointment to help with any pain/discomfort. - Follow-up in our office in 1-2 weeks if you do not have any noticeable improvement or healing.      Why follow it? Research shows. . Those who follow the Mediterranean diet have a reduced risk of heart disease  . The diet is associated with a reduced incidence of Parkinson's and Alzheimer's diseases . People following the diet may have longer life expectancies and lower rates of chronic diseases  . The Dietary Guidelines for Americans recommends the Mediterranean diet as an eating plan to promote health and prevent disease  What Is the Mediterranean Diet?  . Healthy eating plan based on typical foods and recipes of Mediterranean-style cooking . The diet is primarily a plant based diet; these foods should make up a majority of meals   Starches - Plant based foods should make up a majority of meals - They are an important sources of vitamins, minerals, energy, antioxidants, and fiber - Choose whole grains, foods high in fiber and minimally processed items  - Typical grain sources include wheat, oats, barley, corn, brown rice, bulgar, farro, millet, polenta, couscous  - Various types of beans include chickpeas, lentils, fava beans, black beans, white beans   Fruits  Veggies - Large quantities of antioxidant rich fruits & veggies; 6 or more servings  - Vegetables can be eaten raw or lightly drizzled with oil and cooked  - Vegetables common to the traditional Mediterranean Diet include: artichokes, arugula, beets, broccoli, brussel sprouts, cabbage, carrots, celery, collard greens, cucumbers, eggplant, kale, leeks, lemons, lettuce, mushrooms, okra,  onions, peas, peppers, potatoes, pumpkin, radishes, rutabaga, shallots, spinach, sweet potatoes, turnips, zucchini - Fruits common to the Mediterranean Diet include: apples, apricots, avocados, cherries, clementines, dates, figs, grapefruits, grapes, melons, nectarines, oranges, peaches, pears, pomegranates, strawberries, tangerines  Fats - Replace butter and margarine with healthy oils, such as olive oil, canola oil, and tahini  - Limit nuts to no more than a handful a day  - Nuts include walnuts, almonds, pecans, pistachios, pine nuts  - Limit or avoid candied, honey roasted or heavily salted nuts - Olives are central to the [employer] - can be eaten whole or used in a variety of dishes   Meats Protein - Limiting red meat: no more than a few times a month - When eating red meat: choose lean cuts and keep the portion to the size of deck of cards - Eggs: approx. 0 to 4 times a week  - Fish and lean poultry: at least 2 a week  - Healthy protein sources include, chicken, [place], lean beef, lamb - Increase intake of seafood such as tuna, salmon, trout, mackerel, shrimp, scallops - Avoid or limit high fat processed meats such as sausage and bacon  Dairy - Include moderate amounts of low fat dairy products  - Focus on healthy dairy such as fat free yogurt, skim milk, low or reduced fat cheese - Limit dairy products higher in fat such as whole or 2% milk, cheese, ice cream  Alcohol - Moderate amounts of red wine is ok  - No more than 5 oz daily for women (all ages) and men older  than age 37  - No more than 10 oz of wine daily for men younger than 19  Other - Limit sweets and other desserts  - Use herbs and spices instead of salt to flavor foods  - Herbs and spices common to the traditional Mediterranean Diet include: basil, bay leaves, chives, cloves, cumin, fennel, garlic, lavender, marjoram, mint, oregano, parsley, pepper, rosemary, sage, savory, sumac, tarragon, thyme   It's not just a  diet, it's a lifestyle:  . The Mediterranean diet includes lifestyle factors typical of those in the region  . Foods, drinks and meals are best eaten with others and savored . Daily physical activity is important for overall good health . This could be strenuous exercise like running and aerobics . This could also be more leisurely activities such as walking, housework, yard-work, or taking the stairs . Moderation is the key; a balanced and healthy diet accommodates most foods and drinks . Consider portion sizes and frequency of consumption of certain foods   Meal Ideas & Options:  . Breakfast:  o Whole wheat toast or whole wheat English muffins with peanut butter & hard boiled egg o Steel cut oats topped with apples & cinnamon and skim milk  o Fresh fruit: banana, strawberries, melon, berries, peaches  o Smoothies: strawberries, bananas, greek yogurt, peanut butter o Low fat greek yogurt with blueberries and granola  o Egg white omelet with spinach and mushrooms o Breakfast couscous: whole wheat couscous, apricots, skim milk, cranberries  . Sandwiches:  o Hummus and grilled vegetables (peppers, zucchini, squash) on whole wheat bread   o Grilled chicken on whole wheat pita with lettuce, tomatoes, cucumbers or tzatziki  o Tuna salad on whole wheat bread: tuna salad made with greek yogurt, olives, red peppers, capers, green onions o Garlic rosemary lamb pita: lamb sauted with garlic, rosemary, salt & pepper; add lettuce, cucumber, greek yogurt to pita - flavor with lemon juice and black pepper  . Seafood:  o Mediterranean grilled salmon, seasoned with garlic, basil, parsley, lemon juice and black pepper o Shrimp, lemon, and spinach whole-grain pasta salad made with low fat greek yogurt  o Seared scallops with lemon orzo  o Seared tuna steaks seasoned salt, pepper, coriander topped with tomato mixture of olives, tomatoes, olive oil, minced garlic, parsley, green onions and cappers  . Meats:   o Herbed greek chicken salad with kalamata olives, cucumber, feta  o Red bell peppers stuffed with spinach, bulgur, lean ground beef (or lentils) & topped with feta   o Kebabs: skewers of chicken, tomatoes, onions, zucchini, squash  o [place] burgers: made with red onions, mint, dill, lemon juice, feta cheese topped with roasted red peppers . Vegetarian o Cucumber salad: cucumbers, artichoke hearts, celery, red onion, feta cheese, tossed in olive oil & lemon juice  o Hummus and whole grain pita points with a greek salad (lettuce, tomato, feta, olives, cucumbers, red onion) o Lentil soup with celery, carrots made with vegetable broth, garlic, salt and pepper  o Tabouli salad: parsley, bulgur, mint, scallions, cucumbers, tomato, radishes, lemon juice, olive oil, salt and pepper.

## 2016-06-19 NOTE — Assessment & Plan Note (Addendum)
1 x 1 cm skin lesion without evidence of infection. Tenderness only noted with direct palpation of the lesion itself. Central firm/keratinized region suggestive of a scab. Lesion is likely caused by friction and/or a small skin tear. (Appearance is not consistent with genital herpes as pts herpes status is noted) - Bacitracin ointment provided in clinic. Apply twice a day with over-the-counter Band-Aid to help protect against friction. - Encouraged keeping area clean and dry and avoiding prolonged moisture or hot restrictive clothing. - Topical lidocaine ointment also provided to help with pain.

## 2016-06-23 ENCOUNTER — Ambulatory Visit: Payer: Medicare Other | Attending: Family Medicine | Admitting: Physical Therapy

## 2016-06-23 ENCOUNTER — Encounter: Payer: Self-pay | Admitting: Physical Therapy

## 2016-06-23 DIAGNOSIS — M25541 Pain in joints of right hand: Secondary | ICD-10-CM | POA: Diagnosis not present

## 2016-06-23 DIAGNOSIS — M2560 Stiffness of unspecified joint, not elsewhere classified: Secondary | ICD-10-CM | POA: Diagnosis not present

## 2016-06-23 DIAGNOSIS — M25521 Pain in right elbow: Secondary | ICD-10-CM

## 2016-06-23 DIAGNOSIS — M25621 Stiffness of right elbow, not elsewhere classified: Secondary | ICD-10-CM

## 2016-06-23 DIAGNOSIS — S5401XD Injury of ulnar nerve at forearm level, right arm, subsequent encounter: Secondary | ICD-10-CM | POA: Diagnosis not present

## 2016-06-23 DIAGNOSIS — M25531 Pain in right wrist: Secondary | ICD-10-CM | POA: Diagnosis not present

## 2016-06-23 NOTE — Therapy (Signed)
[place], [place], [address] Phone: [phone]   Fax:  [phone]  Physical Therapy Treatment  Patient Details  Name: Veronica Simmons MRN: [id number] Date of Birth: 06-Aug-1951 Referring Provider: Lincoln [name]  Encounter Date: 06/23/2016      PT End of Session - 06/23/16 1002    Visit Number 3   Date for PT Re-Evaluation 08/08/16   PT Start Time 0930   PT Stop Time 1020   PT Time Calculation (min) 50 min      Past Medical History  Diagnosis Date  . Diabetes mellitus 2000    Never on insulin   . Hypertension 2000  . Arthritis   . Anemia 1970s    on iron   . ALLERGIC RHINITIS 02/19/2007    Qualifier: Diagnosis of  By: Lorne Skeens MD, VALENICA    . Cubital tunnel syndrome on right 10/31/2012    Secondary to old fracture. Evaluated by ortho. [facility] offered surgery but patient refused. Numbness and burning pain 4th and 5th digit.      History reviewed. No pertinent past surgical history.  There were no vitals filed for this visit.      Subjective Assessment - 06/23/16 0930    Subjective tingling and carrying on in RT hand   Currently in Pain? Yes   Pain Score 4    Pain Location Hand   Pain Orientation Right            [facility] PT Assessment - 06/23/16 0001    AROM   Overall AROM Comments AROM  elbow ext 0 and supination 52  passive finger ROM WNLS, drawing noted                     [facility] Adult PT Treatment/Exercise - 06/23/16 0001    Elbow Exercises   Elbow Flexion Strengthening;Right;15 reps;Standing   Bar Weights/Barbell (Elbow Flexion) 5 lbs   Elbow Extension Right;15 reps;Standing   Bar Weights/Barbell (Elbow Extension) 3 lbs   Wrist Flexion Strengthening;Right;15 reps;Standing;Bar weights/barbell   Bar Weights/Barbell (Wrist Flexion) 2 lbs   Wrist Extension Strengthening;Right;15 reps;Standing   Bar Weights/Barbell (Wrist Extension) 2 lbs   Other  elbow exercises hammer sup/pron   Other elbow exercises 2# UD and RD 15 times each   Shoulder Exercises: ROM/Strengthening   UBE (Upper Arm Bike) L 2 3 fwd/3 back   Other ROM/Strengthening Exercises lat pull 15 # 2 sets 15   Other ROM/Strengthening Exercises seated row 15# 2 sets 15   Hand Exercises   Other Hand Exercises velcro board   Other Hand Exercises finger web   Modalities   Modalities Paraffin;Moist Heat;Electrical Stimulation   Moist Heat Therapy   Number Minutes Moist Heat 15 Minutes   Moist Heat Location Elbow   Electrical Stimulation   Electrical Stimulation Location right elbow area   Electrical Stimulation Action IFC   Electrical Stimulation Parameters sitting   Electrical Stimulation Goals Pain   RUE Paraffin   Number Minutes Paraffin 15 Minutes   RUE Paraffin Location Hand                  PT Short Term Goals - 06/23/16 0940    PT SHORT TERM GOAL #1   Title independent with intiial HEP   Status Achieved           PT Long Term Goals - 06/23/16 0940    PT LONG TERM GOAL #1  Title report pain and tingling decreased 50%   Status On-going   PT LONG TERM GOAL #2   Title report able to do her hair, she currently reports that she has to have someone do it for her   Status On-going   PT LONG TERM GOAL #3   Title increase right grip strength to 45#   Baseline 38#   Status On-going   PT LONG TERM GOAL #4   Title be able to extend all fingers to neutral   Status On-going               Plan - 06/23/16 1003    Clinical Impression Statement full elbow ext, atrophy of RT hand-full PROM still limited AROM. Tolerated ther ex well. Progressing with goals.   PT Next Visit Plan progress ROM and func use of RT UE      Patient will benefit from skilled therapeutic intervention in order to improve the following deficits and impairments:  Decreased coordination, Decreased range of motion, Decreased strength, Increased fascial restricitons, Increased  muscle spasms, Impaired flexibility, Pain, Impaired UE functional use  Visit Diagnosis: Stiffness of right elbow, not elsewhere classified  Injury of right ulnar nerve, subsequent encounter  Joint pain in fingers of right hand  Pain in right wrist  Pain in right elbow     Problem List Patient Active Problem List   Diagnosis Date Noted  . Skin lesion of right lower limb 06/19/2016  . Leg swelling 05/12/2016  . Subconjunctival hemorrhage of right eye 04/27/2016  . Diarrhea 01/09/2016  . Health care maintenance 06/28/2015  . Anemia 12/21/2014  . Palpitations 01/07/2014  . Recurrent yeast vaginitis 11/05/2013  . Recurrent genital herpes 07/17/2013  . Hot flashes not due to menopause 11/27/2012  . Cubital tunnel syndrome on right 10/31/2012  . Type 2 diabetes, controlled, with neuropathy ([place]) 02/14/2007  . HYPERLIPIDEMIA 02/14/2007  . OBESITY, NOS 02/14/2007  . HYPERTENSION, BENIGN SYSTEMIC 02/14/2007  . ARTHRITIS 02/14/2007  . Insomnia 02/14/2007    PAYSEUR,ANGIE PTA 06/23/2016, 10:06 AM  [address] [place], [place], [address] Phone: [phone]   Fax:  [phone]  Name: Macon Weltzin MRN: [id number] Date of Birth: 09-13-1951

## 2016-06-29 ENCOUNTER — Encounter: Payer: Self-pay | Admitting: Physical Therapy

## 2016-06-29 ENCOUNTER — Ambulatory Visit: Payer: Medicare Other | Admitting: Physical Therapy

## 2016-06-29 DIAGNOSIS — M25521 Pain in right elbow: Secondary | ICD-10-CM | POA: Diagnosis not present

## 2016-06-29 DIAGNOSIS — M25541 Pain in joints of right hand: Secondary | ICD-10-CM

## 2016-06-29 DIAGNOSIS — M25531 Pain in right wrist: Secondary | ICD-10-CM

## 2016-06-29 DIAGNOSIS — M25621 Stiffness of right elbow, not elsewhere classified: Secondary | ICD-10-CM

## 2016-06-29 DIAGNOSIS — M2560 Stiffness of unspecified joint, not elsewhere classified: Secondary | ICD-10-CM | POA: Diagnosis not present

## 2016-06-29 DIAGNOSIS — S5401XD Injury of ulnar nerve at forearm level, right arm, subsequent encounter: Secondary | ICD-10-CM | POA: Diagnosis not present

## 2016-06-29 NOTE — Therapy (Signed)
[place], [place], [address] Phone: [phone]   Fax:  [phone]  Physical Therapy Treatment  Patient Details  Name: Veronica Simmons MRN: [id number] Date of Birth: 09-23-1951 Referring Provider: Lincoln [name]  Encounter Date: 06/29/2016      PT End of Session - 06/29/16 1059    Visit Number 4   Date for PT Re-Evaluation 08/08/16   PT Start Time [id number]   PT Stop Time 1114   PT Time Calculation (min) 59 min   Activity Tolerance Patient tolerated treatment well   Behavior During Therapy [facility] for tasks assessed/performed      Past Medical History  Diagnosis Date  . Diabetes mellitus 2000    Never on insulin   . Hypertension 2000  . Arthritis   . Anemia 1970s    on iron   . ALLERGIC RHINITIS 02/19/2007    Qualifier: Diagnosis of  By: Lorne Skeens MD, VALENICA    . Cubital tunnel syndrome on right 10/31/2012    Secondary to old fracture. Evaluated by ortho. [facility] offered surgery but patient refused. Numbness and burning pain 4th and 5th digit.      History reviewed. No pertinent past surgical history.  There were no vitals filed for this visit.      Subjective Assessment - 06/29/16 1014    Subjective "The numbness and tingling has been off and on, exercises seem to help"   Currently in Pain? Yes   Pain Score 7    Pain Location Hand   Pain Orientation Right   Pain Descriptors / Indicators Tingling;Numbness                         [facility] Adult PT Treatment/Exercise - 06/29/16 0001    Elbow Exercises   Wrist Flexion Strengthening;Right;15 reps;Standing;Bar weights/barbell   Bar Weights/Barbell (Wrist Flexion) 2 lbs   Wrist Extension Strengthening;Right;15 reps;Standing   Bar Weights/Barbell (Wrist Extension) 2 lbs   Other elbow exercises 2# UD and RD 15 times each   Shoulder Exercises: Standing   Other Standing Exercises bicep curls pulley 10 lb then  2x15;  wall push ups 2x10   Other Standing Exercises Triceps press downs 15ln 2x15   Shoulder Exercises: ROM/Strengthening   UBE (Upper Arm Bike) L 2 3 fwd/3 back   Other ROM/Strengthening Exercises lat pull 15 # 2 sets 15   Other ROM/Strengthening Exercises seated row 15# 2 sets 15   Hand Exercises   Other Hand Exercises velcro board   Other Hand Exercises finger web; egg squeezes orang   Modalities   Modalities Paraffin;Moist Heat;Electrical Stimulation   Moist Heat Therapy   Number Minutes Moist Heat 15 Minutes   Moist Heat Location Elbow   Electrical Stimulation   Electrical Stimulation Location right elbow area   Electrical Stimulation Action IFC   Electrical Stimulation Parameters sitting   Electrical Stimulation Goals Pain   RUE Paraffin   Number Minutes Paraffin 15 Minutes   RUE Paraffin Location Hand                  PT Short Term Goals - 06/23/16 0940    PT SHORT TERM GOAL #1   Title independent with intiial HEP   Status Achieved           PT Long Term Goals - 06/23/16 0940    PT LONG TERM GOAL #1   Title report pain and tingling  decreased 50%   Status On-going   PT LONG TERM GOAL #2   Title report able to do her hair, she currently reports that she has to have someone do it for her   Status On-going   PT LONG TERM GOAL #3   Title increase right grip strength to 45#   Baseline 38#   Status On-going   PT LONG TERM GOAL #4   Title be able to extend all fingers to neutral   Status On-going               Plan - 06/29/16 1100    Clinical Impression Statement Pt tolerated more interventions well, reports that exercises has reduced some numbness and tingling. Continues to progress.   Rehab Potential Good   PT Frequency 2x / week   PT Duration 8 weeks   PT Treatment/Interventions ADLs/Self Care Home Management;Electrical Stimulation;Moist Heat;Therapeutic exercise;Therapeutic activities;Parrafin;Ultrasound;Neuromuscular re-education;Patient/family  education;Manual techniques   PT Next Visit Plan progress ROM and func use of RT UE      Patient will benefit from skilled therapeutic intervention in order to improve the following deficits and impairments:  Decreased coordination, Decreased range of motion, Decreased strength, Increased fascial restricitons, Increased muscle spasms, Impaired flexibility, Pain, Impaired UE functional use  Visit Diagnosis: Stiffness of right elbow, not elsewhere classified  Injury of right ulnar nerve, subsequent encounter  Joint pain in fingers of right hand  Pain in right wrist  Pain in right elbow  Stiffness of multiple joints     Problem List Patient Active Problem List   Diagnosis Date Noted  . Skin lesion of right lower limb 06/19/2016  . Leg swelling 05/12/2016  . Subconjunctival hemorrhage of right eye 04/27/2016  . Diarrhea 01/09/2016  . Health care maintenance 06/28/2015  . Anemia 12/21/2014  . Palpitations 01/07/2014  . Recurrent yeast vaginitis 11/05/2013  . Recurrent genital herpes 07/17/2013  . Hot flashes not due to menopause 11/27/2012  . Cubital tunnel syndrome on right 10/31/2012  . Type 2 diabetes, controlled, with neuropathy ([place]) 02/14/2007  . HYPERLIPIDEMIA 02/14/2007  . OBESITY, NOS 02/14/2007  . HYPERTENSION, BENIGN SYSTEMIC 02/14/2007  . ARTHRITIS 02/14/2007  . Insomnia 02/14/2007    Scot Jun, PTA  06/29/2016, 11:04 AM  [address] [place], [place], [address] Phone: [phone]   Fax:  [phone]  Name: Veronica Simmons MRN: [id number] Date of Birth: [birth year]

## 2016-07-04 ENCOUNTER — Ambulatory Visit: Payer: Medicare Other

## 2016-07-06 ENCOUNTER — Encounter: Payer: Self-pay | Admitting: Physical Therapy

## 2016-07-06 ENCOUNTER — Ambulatory Visit: Payer: Medicare Other | Admitting: Physical Therapy

## 2016-07-06 DIAGNOSIS — M2560 Stiffness of unspecified joint, not elsewhere classified: Secondary | ICD-10-CM | POA: Diagnosis not present

## 2016-07-06 DIAGNOSIS — S5401XD Injury of ulnar nerve at forearm level, right arm, subsequent encounter: Secondary | ICD-10-CM | POA: Diagnosis not present

## 2016-07-06 DIAGNOSIS — M25541 Pain in joints of right hand: Secondary | ICD-10-CM | POA: Diagnosis not present

## 2016-07-06 DIAGNOSIS — M25531 Pain in right wrist: Secondary | ICD-10-CM | POA: Diagnosis not present

## 2016-07-06 DIAGNOSIS — M25521 Pain in right elbow: Secondary | ICD-10-CM | POA: Diagnosis not present

## 2016-07-06 DIAGNOSIS — M25621 Stiffness of right elbow, not elsewhere classified: Secondary | ICD-10-CM

## 2016-07-06 NOTE — Therapy (Signed)
[place], [place], [address] Phone: [phone]   Fax:  [phone]  Physical Therapy Treatment  Patient Details  Name: Veronica Simmons MRN: [id number] Date of Birth: Mar 31, 1951 Referring Provider: Lincoln [name]  Encounter Date: 07/06/2016      PT End of Session - 07/06/16 0926    Visit Number 5   Date for PT Re-Evaluation 08/08/16   Authorization Type medicad   PT Start Time 0845   PT Stop Time 0940   PT Time Calculation (min) 55 min   Activity Tolerance Patient tolerated treatment well   Behavior During Therapy [facility] for tasks assessed/performed      Past Medical History  Diagnosis Date  . Diabetes mellitus 2000    Never on insulin   . Hypertension 2000  . Arthritis   . Anemia 1970s    on iron   . ALLERGIC RHINITIS 02/19/2007    Qualifier: Diagnosis of  By: Lorne Skeens MD, VALENICA    . Cubital tunnel syndrome on right 10/31/2012    Secondary to old fracture. Evaluated by ortho. [facility] offered surgery but patient refused. Numbness and burning pain 4th and 5th digit.      History reviewed. No pertinent past surgical history.  There were no vitals filed for this visit.      Subjective Assessment - 07/06/16 0849    Subjective "My hand is getting better, I have more strength" Pt also reports that she has been doing weights.   Currently in Pain? Yes   Pain Score 4    Pain Location Hand   Pain Orientation Right   Pain Descriptors / Indicators Tingling;Numbness            [facility] PT Assessment - 07/06/16 0001    Strength   Overall Strength Comments right grip strength 50#, left 45#                     [facility] Adult PT Treatment/Exercise - 07/06/16 0001    Elbow Exercises   Wrist Flexion Strengthening;Right;Standing;Bar weights/barbell;10 reps   Bar Weights/Barbell (Wrist Flexion) 3 lbs   Wrist Extension Strengthening;Right;15 reps;Standing;10 reps   Bar  Weights/Barbell (Wrist Extension) 3 lbs   Other elbow exercises hammer sup/pron   Other elbow exercises UD and RD 15 times each with red Tband    Shoulder Exercises: ROM/Strengthening   UBE (Upper Arm Bike) L 2 3 fwd/3 back   Other ROM/Strengthening Exercises lat pull 20lb  2 sets 15   Other ROM/Strengthening Exercises seated row 20lb 2 sets 15   Hand Exercises   Other Hand Exercises velcro board   Other Hand Exercises finger web; egg squeezes orang   Modalities   Modalities Paraffin;Moist Heat;Electrical Stimulation   Moist Heat Therapy   Number Minutes Moist Heat 15 Minutes   Moist Heat Location Elbow   Electrical Stimulation   Electrical Stimulation Location right elbow area   Electrical Stimulation Action IFC   Electrical Stimulation Parameters sitting   Electrical Stimulation Goals Pain   RUE Paraffin   Number Minutes Paraffin 15 Minutes   RUE Paraffin Location Hand                  PT Short Term Goals - 06/23/16 0940    PT SHORT TERM GOAL #1   Title independent with intiial HEP   Status Achieved           PT Long Term Goals -  07/06/16 0859    PT LONG TERM GOAL #1   Title report pain and tingling decreased 50%   Baseline continues to have tingling   Status Partially Met   PT LONG TERM GOAL #2   Title report able to do her hair, she currently reports that she has to have someone do it for her   Status On-going   PT LONG TERM GOAL #3   Title increase right grip strength to 45#   Status Achieved   PT LONG TERM GOAL #4   Title be able to extend all fingers to neutral   Status On-going               Plan - 07/06/16 0926    Clinical Impression Statement Pt enters clinic reporting that her hand felt better overall. Pt has progress and met her grip strength goal, pt also has progressed and partially met her pain goal. Does report increase elbow pain during UBE warm up but able to completed all of today's activities, some with increase weight.    Rehab Potential Good   PT Frequency 2x / week   PT Duration 8 weeks   PT Treatment/Interventions ADLs/Self Care Home Management;Electrical Stimulation;Moist Heat;Therapeutic exercise;Therapeutic activities;Parrafin;Ultrasound;Neuromuscular re-education;Patient/family education;Manual techniques   PT Next Visit Plan progress ROM and func use of RT U, increase velcro board resistance.      Patient will benefit from skilled therapeutic intervention in order to improve the following deficits and impairments:  Decreased coordination, Decreased range of motion, Decreased strength, Increased fascial restricitons, Increased muscle spasms, Impaired flexibility, Pain, Impaired UE functional use  Visit Diagnosis: Stiffness of right elbow, not elsewhere classified  Injury of right ulnar nerve, subsequent encounter  Joint pain in fingers of right hand  Pain in right wrist  Pain in right elbow  Stiffness of multiple joints     Problem List Patient Active Problem List   Diagnosis Date Noted  . Skin lesion of right lower limb 06/19/2016  . Leg swelling 05/12/2016  . Subconjunctival hemorrhage of right eye 04/27/2016  . Diarrhea 01/09/2016  . Health care maintenance 06/28/2015  . Anemia 12/21/2014  . Palpitations 01/07/2014  . Recurrent yeast vaginitis 11/05/2013  . Recurrent genital herpes 07/17/2013  . Hot flashes not due to menopause 11/27/2012  . Cubital tunnel syndrome on right 10/31/2012  . Type 2 diabetes, controlled, with neuropathy ([place]) 02/14/2007  . HYPERLIPIDEMIA 02/14/2007  . OBESITY, NOS 02/14/2007  . HYPERTENSION, BENIGN SYSTEMIC 02/14/2007  . ARTHRITIS 02/14/2007  . Insomnia 02/14/2007    Scot Jun, PTA  07/06/2016, 9:29 AM  [address] [place], [place], [address] Phone: [phone]   Fax:  [phone]  Name: Veronica Simmons MRN: [id number] Date of Birth: November 12, 1951

## 2016-07-08 ENCOUNTER — Ambulatory Visit: Payer: Self-pay

## 2016-07-12 ENCOUNTER — Ambulatory Visit: Payer: Medicare Other | Admitting: Physical Therapy

## 2016-07-13 ENCOUNTER — Other Ambulatory Visit: Payer: Self-pay | Admitting: Student

## 2016-07-19 ENCOUNTER — Encounter: Payer: Self-pay | Admitting: Physical Therapy

## 2016-07-19 ENCOUNTER — Ambulatory Visit: Payer: Medicare Other | Attending: Family Medicine | Admitting: Physical Therapy

## 2016-07-19 DIAGNOSIS — M25521 Pain in right elbow: Secondary | ICD-10-CM | POA: Insufficient documentation

## 2016-07-19 DIAGNOSIS — M25621 Stiffness of right elbow, not elsewhere classified: Secondary | ICD-10-CM | POA: Diagnosis not present

## 2016-07-19 DIAGNOSIS — M25541 Pain in joints of right hand: Secondary | ICD-10-CM | POA: Diagnosis not present

## 2016-07-19 DIAGNOSIS — M2560 Stiffness of unspecified joint, not elsewhere classified: Secondary | ICD-10-CM | POA: Insufficient documentation

## 2016-07-19 DIAGNOSIS — S5401XD Injury of ulnar nerve at forearm level, right arm, subsequent encounter: Secondary | ICD-10-CM

## 2016-07-19 DIAGNOSIS — M25531 Pain in right wrist: Secondary | ICD-10-CM

## 2016-07-19 NOTE — Therapy (Signed)
[facility] [address] [place], [place], [address] Phone: [phone]   Fax:  [phone]  Physical Therapy Treatment  Patient Details  Name: Veronica Simmons MRN: [id number] Date of Birth: 09/15/1951 Referring Provider: Haney  Encounter Date: 07/19/2016      PT End of Session - 07/19/16 1108    Visit Number 6   Date for PT Re-Evaluation 08/08/16   Authorization Type medicad   PT Start Time 1015   PT Stop Time 1117   PT Time Calculation (min) 62 min   Activity Tolerance Patient tolerated treatment well   Behavior During Therapy [facility] for tasks assessed/performed      Past Medical History:  Diagnosis Date  . ALLERGIC RHINITIS 02/19/2007   Qualifier: Diagnosis of  By: EGGLESTON CLARK MD, VALENICA    . Anemia 1970s   on iron   . Arthritis   . Cubital tunnel syndrome on right 10/31/2012   Secondary to old fracture. Evaluated by ortho. [facility] offered surgery but patient refused. Numbness and burning pain 4th and 5th digit.    . Diabetes mellitus 2000   Never on insulin   . Hypertension 2000    History reviewed. No pertinent surgical history.  There were no vitals filed for this visit.      Subjective Assessment - 07/19/16 1013    Subjective "Its going, Im still doing my exercises, I know it is going to be a long process"    Currently in Pain? Yes   Pain Score 4    Pain Location Hand   Pain Orientation Right   Pain Descriptors / Indicators Throbbing            [facility] PT Assessment - 07/19/16 0001      AROM   Overall AROM Comments AROM WFL     Strength   Overall Strength Comments right grip strength 45#, left 45#                     [facility] Adult PT Treatment/Exercise - 07/19/16 0001      Shoulder Exercises: Seated   Other Seated Exercises Seated OHP 2x10     Shoulder Exercises: Standing   Other Standing Exercises bicept curls pulley 10lb 2x10; wall push ups 2x10   Other  Standing Exercises Tricep press downs 15lb 2x10; standing rev grip rows 2x15     Shoulder Exercises: ROM/Strengthening   UBE (Upper Arm Bike) L 2 3 fwd/3 back     Hand Exercises   Other Hand Exercises velcro board   Other Hand Exercises finger web; egg squeezes orang     Modalities   Modalities Paraffin;Moist Heat;Electrical Stimulation     Moist Heat Therapy   Number Minutes Moist Heat 15 Minutes   Moist Heat Location Elbow     Electrical Stimulation   Electrical Stimulation Location right elbow area   Electrical Stimulation Action IFC   Electrical Stimulation Parameters sitting    Electrical Stimulation Goals Pain     RUE Paraffin   Number Minutes Paraffin 15 Minutes   RUE Paraffin Location Hand                  PT Short Term Goals - 06/23/16 0940      PT SHORT TERM GOAL #1   Title independent with intiial HEP   Status Achieved           PT Long Term Goals - 07/19/16 1052        PT LONG TERM GOAL #1   Title report pain and tingling decreased 50%   Status Partially Met     PT LONG TERM GOAL #2   Title report able to do her hair, she currently reports that she has to have someone do it for her   Status Partially Met     PT LONG TERM GOAL #4   Title be able to extend all fingers to neutral   Status On-going               Plan - 07/19/16 1110    Clinical Impression Statement Pt has progressed and met some goals. Continues to have difficulty extending the R digits. Pt reports that the exercises does wake up the pain while on UBE but able to complete all exercises.     Rehab Potential Good   PT Frequency 2x / week   PT Duration 8 weeks   PT Next Visit Plan MT to extend digits       Patient will benefit from skilled therapeutic intervention in order to improve the following deficits and impairments:  Decreased coordination, Decreased range of motion, Decreased strength, Increased fascial restricitons, Increased muscle spasms, Impaired  flexibility, Pain, Impaired UE functional use  Visit Diagnosis: Stiffness of right elbow, not elsewhere classified  Injury of right ulnar nerve, subsequent encounter  Joint pain in fingers of right hand  Pain in right wrist  Pain in right elbow  Stiffness of multiple joints     Problem List Patient Active Problem List   Diagnosis Date Noted  . Skin lesion of right lower limb 06/19/2016  . Leg swelling 05/12/2016  . Subconjunctival hemorrhage of right eye 04/27/2016  . Diarrhea 01/09/2016  . Health care maintenance 06/28/2015  . Anemia 12/21/2014  . Palpitations 01/07/2014  . Recurrent yeast vaginitis 11/05/2013  . Recurrent genital herpes 07/17/2013  . Hot flashes not due to menopause 11/27/2012  . Cubital tunnel syndrome on right 10/31/2012  . Type 2 diabetes, controlled, 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  . HYPERLIPIDEMIA 02/14/2007  . OBESITY, NOS 02/14/2007  . HYPERTENSION, BENIGN SYSTEMIC 02/14/2007  . ARTHRITIS 02/14/2007  . Insomnia 02/14/2007    Ronald G Pemberton, PTA  07/19/2016, 11:15 AM   [facility] [address] [place], [place], [address] Phone: [phone]   Fax:  [phone]  Name: Veronica Simmons MRN: [id number] Date of Birth: 05/02/1951    

## 2016-07-20 ENCOUNTER — Encounter: Payer: Self-pay | Admitting: *Deleted

## 2016-07-20 ENCOUNTER — Ambulatory Visit (INDEPENDENT_AMBULATORY_CARE_PROVIDER_SITE_OTHER): Payer: Medicare Other | Admitting: *Deleted

## 2016-07-20 VITALS — BP 150/65 | HR 81 | Temp 97.9°F | Ht 65.5 in | Wt 222.8 lb

## 2016-07-20 DIAGNOSIS — Z1159 Encounter for screening for other viral diseases: Secondary | ICD-10-CM | POA: Diagnosis not present

## 2016-07-20 DIAGNOSIS — E785 Hyperlipidemia, unspecified: Secondary | ICD-10-CM | POA: Diagnosis not present

## 2016-07-20 DIAGNOSIS — Z0000 Encounter for general adult medical examination without abnormal findings: Secondary | ICD-10-CM

## 2016-07-20 DIAGNOSIS — E1140 Type 2 diabetes mellitus with diabetic neuropathy, unspecified: Secondary | ICD-10-CM

## 2016-07-20 DIAGNOSIS — Z114 Encounter for screening for human immunodeficiency virus [HIV]: Secondary | ICD-10-CM

## 2016-07-20 LAB — POCT GLYCOSYLATED HEMOGLOBIN (HGB A1C): HEMOGLOBIN A1C: 8.5

## 2016-07-20 NOTE — Progress Notes (Signed)
Patient ID: Veronica Simmons, female   DOB: June 19, 1951, 65 y.o.   MRN: [id number]  [facility] consult from RN Lauren to assess patient for safety for SI. Patient reported suicidal thoughts    Patient is pleasant, positive, joyful, engaged in conversation and well groomed.  States she had thoughts but no intention of acting on them.  Per patient "I would never hurt myself these thoughts are mainly associated with wanting to go home to be with God when he takes me".  Patient has a strong faith and uses this to manager her feelings and thoughts.  Patient has formal education, is goal oriented and has local family support.  Discussed services offered by [facility], patient appreciative of support offered however patient is not interested in follow up. Depression screen PHQ 2/9 07/20/2016  Decreased Interest 2  Down, Depressed, Hopeless 1  PHQ - 2 Score 3  Altered sleeping 3  Tired, decreased energy 3  Change in appetite 0  Feeling bad or failure about yourself  1  Trouble concentrating 1  Moving slowly or fidgety/restless 1  Suicidal thoughts 1  PHQ-9 Score 13  Difficult doing work/chores Somewhat difficult  PHQ-9 is 13 an indicator of mild depression. Per patient this is mainly due to family and financial stressors. Patient denies SI of hurting self or others, has no plan.  No history of inpatient or outpatient treatment, no history of SI or family history. Patient is not interested in [facility] services or being referred for therapy.  Based on the above information patient does not appear to be a danger to herself. Discussed safety plan with patient in the event she has SI. Offered [facility] services to assist patient with managing her stressors.  She continued to decline the service. Plan:  Provided patient with contact number and information sheet for [facility], provided community resources for outpatient providers and a pocket card for Mobile Crisis phone number. Will follow up with patient next week.  Casimer Lanius,  LCSW Licensed Clinical Social Worker [facility]   [phone] 2:37 PM

## 2016-07-20 NOTE — Progress Notes (Signed)
Subjective:   Veronica Simmons is a 65 y.o. female who presents for an Initial Medicare Annual Wellness Visit.   Cardiac Risk Factors include: advanced age (>33men, >77 women);diabetes mellitus;dyslipidemia;hypertension;obesity (BMI >30kg/m2)     Objective:    Today's Vitals   07/20/16 1120 07/20/16 1121  BP:  (!) 150/65  Pulse:  81  Temp:  97.9 F (36.6 C)  TempSrc:  Oral  SpO2:  99%  Weight: 222 lb 12.8 oz (101.1 kg)   Height: 5' 5.5" (1.664 m)   PainSc:  7   PainLoc:  Foot   Body mass index is 36.51 kg/m.   Current Medications (verified) Outpatient Encounter Prescriptions as of 07/20/2016  Medication Sig  . amLODipine (NORVASC) 10 MG tablet TAKE 1 TABLET BY MOUTH EVERY DAY  . bacitracin 500 UNIT/GM ointment Apply 1 application topically 2 (two) times daily.  Marland Kitchen gabapentin (NEURONTIN) 100 MG capsule Take 1 capsule (100 mg total) by mouth 3 (three) times daily.  Marland Kitchen glucose blood (ACCU-CHEK AVIVA PLUS) test strip Use as instructed  . glucose blood test strip Use as instructed  . JANUMET 50-1000 MG tablet TAKE 1 TABLET BY MOUTH TWICE DAILY WITH MEALS (Patient taking differently: Taking 1 tab in PM only)  . Lancets (ONETOUCH ULTRASOFT) lancets Use as instructed  . lidocaine (XYLOCAINE) 5 % ointment Apply 1 application topically 3 (three) times daily as needed.  Marland Kitchen lisinopril (PRINIVIL,ZESTRIL) 20 MG tablet Take 1 tablet (20 mg total) by mouth daily.  Marland Kitchen loperamide (IMODIUM A-D) 2 MG tablet Take 1 tablet (2 mg total) by mouth 4 (four) times daily as needed for diarrhea or loose stools.  . Multiple Vitamin (MULTIVITAMIN WITH MINERALS) TABS tablet Take 1 tablet by mouth daily.  Marland Kitchen omega-3 acid ethyl esters (LOVAZA) 1 g capsule Take 1 g by mouth daily.  . potassium chloride SA (K-DUR,KLOR-CON) 20 MEQ tablet Take 20 mEq by mouth daily.  . sitaGLIPtin-metformin (JANUMET) 50-500 MG tablet Take 1 tablet by mouth 2 (two) times daily with a meal. (Patient taking differently: Take 1 tablet  by mouth 2 (two) times daily with a meal. )  . traZODone (DESYREL) 100 MG tablet TAKE 1/2 TO 1 TABLET BY MOUTH EVERY NIGHT AT BEDTIME AS NEEDED FOR SLEEP  . aspirin EC 81 MG tablet Take 1 tablet (81 mg total) by mouth daily. (Patient not taking: Reported on 07/20/2016)   No facility-administered encounter medications on file as of 07/20/2016.     Allergies (verified) Levofloxacin   History: Past Medical History:  Diagnosis Date  . ALLERGIC RHINITIS 02/19/2007   Qualifier: Diagnosis of  By: Lorne Skeens MD, VALENICA    . Anemia 1970s   on iron   . Arthritis   . Cubital tunnel syndrome on right 10/31/2012   Secondary to old fracture. Evaluated by ortho. [facility] offered surgery but patient refused. Numbness and burning pain 4th and 5th digit.    . Diabetes mellitus 2000   Never on insulin   . Hypertension 2000   History reviewed. No pertinent surgical history. Family History  Problem Relation Age of Onset  . Early death Mother 56    shot   . Heart disease Father 30  . Diabetes Daughter   . Kidney disease Daughter     on dialysis   . Diabetes Maternal Aunt   . Cancer Paternal Uncle     Breast Cancer    Social History   Occupational History  . 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 Smoking status: Never Smoker  . Smokeless tobacco: Never Used  . Alcohol use No  . Drug use: No  . Sexual activity: No    Tobacco Counseling Counseling given: Yes   Activities of Daily Living In your present state of health, do you have any difficulty performing the following activities: 07/20/2016  Hearing? Y  Vision? Y  Difficulty concentrating or making decisions? N  Walking or climbing stairs? Y  Dressing or bathing? N  Doing errands, shopping? N  Preparing Food and eating ? N  Using the Toilet? Y  In the past six months, have you accidently leaked urine? Y  Do you have problems with loss of bowel control? Y  Managing your Medications?  N  Managing your Finances? N  Housekeeping or managing your Housekeeping? N  Some recent data might be hidden  Home Safety:  My home has a working smoke alarm:  Yes, one in every room           My home throw rugs have been fastened down to the floor or removed:  Has one throw rug in bedroom that she will remove I have non-slip mats in the bathtub and shower:  Yes         All my home's stairs have railings or bannisters: one level home with  5 outside steps with railing         My home's floors, stairs and hallways are free from clutter, wires and cords:  Has several non-working outlets so has cords running from working ones. Has obtained "[employer]" to have outlets updated which will eliminate need for extension cords.    Immunizations and Health Maintenance Immunization History  Administered Date(s) Administered  . PPD Test 04/09/2013  . Td 06/17/2005   Health Maintenance Due  Topic Date Due  . Hepatitis C Screening  [birth year]  . HIV Screening  [birth year]1965  . COLONOSCOPY  12/07/2000  . ZOSTAVAX  12/07/2010  . PAP SMEAR  01/19/2011  . TETANUS/TDAP  06/18/2015  . DEXA SCAN  12/08/2015  . PNA vac Low Risk Adult (1 of 2 - PCV13) 12/08/2015  . HEMOGLOBIN A1C  04/24/2016  . FOOT EXAM  06/27/2016  . INFLUENZA VACCINE  07/18/2016  Blood drawn today for HIV, Hep C, Lipid panel and Hgb A1c Patient states she has done fecal occult blood testing in past and prefers this over colonoscopy. Discussed potential for more findings with colonoscopy and encouraged patient to schedule. Contact info given. Patient to go to [facility] to obtain zostavax and TDaP Encouraged to schedule mammogram and bone density at [facility]. Contact info given  Patient refused pneumococcal vaccine Encouraged patient to schedule appt with PCP for pap smear.  Diabetic Foot Exam - Simple   Simple Foot Form Diabetic Foot exam was performed with the following findings:  Yes 07/20/2016 12:05 PM  Visual  Inspection No deformities, no ulcerations, no other skin breakdown bilaterally:  Yes Sensation Testing Intact to touch and monofilament testing bilaterally:  Yes See comments:  Yes Pulse Check Posterior Tibialis and Dorsalis pulse intact bilaterally:  Yes Comments No sensation in right great toe to monofilament testing     Patient Care Team: Veatrice Bourbon, MD as PCP - General (Student) Calton Dach, MD as Referring Physician (Optometry)  Indicate any recent Medical Services you may have received from other than [facility] providers in the past year (date may be approximate).  Assessment:   This is a routine wellness examination for Veronica Simmons.   Hearing/Vision screen  Hearing Screening   125Hz  250Hz  500Hz  1000Hz  2000Hz  3000Hz  4000Hz  6000Hz  8000Hz   Right ear:   40 40 40  Fail    Left ear:   40 40 40  Fail      Dietary issues and exercise activities discussed: Current Exercise Habits: Home exercise routine, Type of exercise: walking (Dancing), Time (Minutes): 30, Frequency (Times/Week): 5, Weekly Exercise (Minutes/Week): 150, Intensity: Moderate  Goals    . Blood Pressure < 140/90    . HEMOGLOBIN A1C < 7.0    . Weight (lb) < 207 lb (93.9 kg)          7% weight loss     . Weight < 150 lb (68.04 kg)     Discussed recording consumption intake to assist with desired 7% weight loss. Recommended MyPLate or My Fitness Pal apps to assist with recording.  Depression Screen PHQ 2/9 Scores 07/20/2016 06/19/2016 05/11/2016 04/28/2016 04/25/2016 10/26/2015 06/28/2015  PHQ - 2 Score 3 0 0 0 0 0 0  PHQ- 9 Score 13 - - - - - -  Patient denies thoughts that she would be better off dead or harming herself in some way at present. [place] in to evaluate patient prior to discharge. PCP made aware.  Fall Risk Fall Risk  07/20/2016 06/19/2016 05/11/2016 04/28/2016 04/25/2016  Falls in the past year? No No No No No    Cognitive Function: Mini-Cog failed with score  1/5  Screening Tests Health Maintenance  Topic Date Due  . Hepatitis C Screening  1951/07/23  . HIV Screening  12/07/1965  . COLONOSCOPY  12/07/2000  . ZOSTAVAX  12/07/2010  . PAP SMEAR  01/19/2011  . TETANUS/TDAP  06/18/2015  . DEXA SCAN  12/08/2015  . PNA vac Low Risk Adult (1 of 2 - PCV13) 12/08/2015  . HEMOGLOBIN A1C  04/24/2016  . FOOT EXAM  06/27/2016  . INFLUENZA VACCINE  07/18/2016  . OPHTHALMOLOGY EXAM  07/30/2016  . MAMMOGRAM  07/01/2017     Plan:     During the course of the visit, Veronica Simmons was educated and counseled about the following appropriate screening and preventive services:   Vaccines to include Pneumoccal, Influenza, Td, Zostavax  Cardiovascular disease screening  Colorectal cancer screening  Bone density screening  Diabetes screening  Mammography/PAP  Nutrition counseling   Patient Instructions (the written plan) were given to the patient.    Velora Heckler, RN   07/20/2016

## 2016-07-20 NOTE — Patient Instructions (Signed)
Bone Densitometry Bone densitometry is an imaging test that uses a special X-ray to measure the amount of calcium and other minerals in your bones (bone density). This test is also known as a bone mineral density test or dual-energy X-ray absorptiometry (DXA). The test can measure bone density at your hip and your spine. It is similar to having a regular X-ray. You may have this test to:  Diagnose a condition that causes weak or thin bones (osteoporosis).  Predict your risk of a broken bone (fracture).  Determine how well osteoporosis treatment is working. LET [facility] CARE PROVIDER KNOW ABOUT:  Any allergies you have.  All medicines you are taking, including vitamins, herbs, eye drops, creams, and over-the-counter medicines.  Previous problems you or members of your family have had with the use of anesthetics.  Any blood disorders you have.  Previous surgeries you have had.  Medical conditions you have.  Possibility of pregnancy.  Any other medical test you had within the previous 14 days that used contrast material. RISKS AND COMPLICATIONS Generally, this is a safe procedure. However, problems can occur and may include the following:  This test exposes you to a very small amount of radiation.  The risks of radiation exposure may be greater to unborn children. BEFORE THE PROCEDURE  Do not take any calcium supplements for 24 hours before having the test. You can otherwise eat and drink what you usually do.  Take off all metal jewelry, eyeglasses, dental appliances, and any other metal objects. PROCEDURE  You may lie on an exam table. There will be an X-ray generator below you and an imaging device above you.  Other devices, such as boxes or braces, may be used to position your body properly for the scan.  You will need to lie still while the machine slowly scans your body.  The images will show up on a computer monitor. AFTER THE PROCEDURE You may need more testing  at a later time.   This information is not intended to replace advice given to you by your health care provider. Make sure you discuss any questions you have with your health care provider.   Document Released: 12/26/2004 Document Revised: 12/25/2014 Document Reviewed: 05/14/2014 Elsevier Interactive Patient Education 2016 [employer].   Colonoscopy A colonoscopy is an exam to look at the entire large intestine (colon). This exam can help find problems such as tumors, polyps, inflammation, and areas of bleeding. The exam takes about 1 hour.  LET [facility] CARE PROVIDER KNOW ABOUT:   Any allergies you have.  All medicines you are taking, including vitamins, herbs, eye drops, creams, and over-the-counter medicines.  Previous problems you or members of your family have had with the use of anesthetics.  Any blood disorders you have.  Previous surgeries you have had.  Medical conditions you have. RISKS AND COMPLICATIONS  Generally, this is a safe procedure. However, as with any procedure, complications can occur. Possible complications include:  Bleeding.  Tearing or rupture of the colon wall.  Reaction to medicines given during the exam.  Infection (rare). BEFORE THE PROCEDURE   Ask your health care provider about changing or stopping your regular medicines.  You may be prescribed an oral bowel prep. This involves drinking a large amount of medicated liquid, starting the day before your procedure. The liquid will cause you to have multiple loose stools until your stool is almost clear or light green. This cleans out your colon in preparation for the procedure.  Do not eat or drink anything else once you have started the bowel prep, unless your health care provider tells you it is safe to do so.  Arrange for someone to drive you home after the procedure. PROCEDURE   You will be given medicine to help you relax (sedative).  You will lie on your side with your knees  bent.  A long, flexible tube with a light and camera on the end (colonoscope) will be inserted through the rectum and into the colon. The camera sends video back to a computer screen as it moves through the colon. The colonoscope also releases carbon dioxide gas to inflate the colon. This helps your health care provider see the area better.  During the exam, your health care provider may take a small tissue sample (biopsy) to be examined under a microscope if any abnormalities are found.  The exam is finished when the entire colon has been viewed. AFTER THE PROCEDURE   Do not drive for 24 hours after the exam.  You may have a small amount of blood in your stool.  You may pass moderate amounts of gas and have mild abdominal cramping or bloating. This is caused by the gas used to inflate your colon during the exam.  Ask when your test results will be ready and how you will get your results. Make sure you get your test results.   This information is not intended to replace advice given to you by your health care provider. Make sure you discuss any questions you have with your health care provider.   Document Released: 12/01/2000 Document Revised: 09/24/2013 Document Reviewed: 08/11/2013 Elsevier Interactive Patient Education 2016 [employer] Inc[employer]   Fat and Cholesterol Restricted Diet Getting too much fat and cholesterol in your diet may cause health problems. Following this diet helps keep your fat and cholesterol at normal levels. This can keep you from getting sick. WHAT TYPES OF FAT SHOULD I CHOOSE?  Choose monosaturated and polyunsaturated fats. These are found in foods such as olive oil, canola oil, flaxseeds, walnuts, almonds, and seeds.  Eat more omega-3 fats. Good choices include salmon, mackerel, sardines, tuna, flaxseed oil, and ground flaxseeds.  Limit saturated fats. These are in animal products such as meats, butter, and cream. They can also be in plant products such as palm  oil, palm kernel oil, and coconut oil.   Avoid foods with partially hydrogenated oils in them. These contain trans fats. Examples of foods that have trans fats are stick margarine, some tub margarines, cookies, crackers, and other baked goods. WHAT GENERAL GUIDELINES DO I NEED TO FOLLOW?   Check food labels. Look for the words "trans fat" and "saturated fat."  When preparing a meal:  Fill half of your plate with vegetables and green salads.  Fill one fourth of your plate with whole grains. Look for the word "whole" as the first word in the ingredient list.  Fill one fourth of your plate with lean protein foods.  Limit fruit to two servings a day. Choose fruit instead of juice.  Eat more foods with soluble fiber. Examples of foods with this type of fiber are apples, broccoli, carrots, beans, peas, and barley. Try to get 20-30 g (grams) of fiber per day.  Eat more home-cooked foods. Eat less at restaurants and buffets.  Limit or avoid alcohol.  Limit foods high in starch and sugar.  Limit fried foods.  Cook foods without frying them. Baking, boiling, grilling, and broiling are all great options.  Lose weight if you are overweight. Losing even a small amount of weight can help your overall health. It can also help prevent diseases such as diabetes and heart disease. WHAT FOODS CAN I EAT? Grains Whole grains, such as whole wheat or whole grain breads, crackers, cereals, and pasta. Unsweetened oatmeal, bulgur, barley, quinoa, or brown rice. Corn or whole wheat flour tortillas. Vegetables Fresh or frozen vegetables (raw, steamed, roasted, or grilled). Green salads. Fruits All fresh, canned (in natural juice), or frozen fruits. Meat and Other Protein Products Ground beef (85% or leaner), grass-fed beef, or beef trimmed of fat. Skinless chicken or [place]. Ground chicken or [place]. Pork trimmed of fat. All fish and seafood. Eggs. Dried beans, peas, or lentils. Unsalted nuts or seeds.  Unsalted canned or dry beans. Dairy Low-fat dairy products, such as skim or 1% milk, 2% or reduced-fat cheeses, low-fat ricotta or cottage cheese, or plain low-fat yogurt. Fats and Oils Tub margarines without trans fats. Light or reduced-fat mayonnaise and salad dressings. Avocado. Olive, canola, sesame, or safflower oils. Natural peanut or almond butter (choose ones without added sugar and oil). The items listed above may not be a complete list of recommended foods or beverages. Contact your dietitian for more options. WHAT FOODS ARE NOT RECOMMENDED? Grains White bread. White pasta. White rice. Cornbread. Bagels, pastries, and croissants. Crackers that contain trans fat. Vegetables White potatoes. Corn. Creamed or fried vegetables. Vegetables in a cheese sauce. Fruits Dried fruits. Canned fruit in light or heavy syrup. Fruit juice. Meat and Other Protein Products Fatty cuts of meat. Ribs, chicken wings, bacon, sausage, bologna, salami, chitterlings, fatback, hot dogs, bratwurst, and packaged luncheon meats. Liver and organ meats. Dairy Whole or 2% milk, cream, half-and-half, and cream cheese. Whole milk cheeses. Whole-fat or sweetened yogurt. Full-fat cheeses. Nondairy creamers and whipped toppings. Processed cheese, cheese spreads, or cheese curds. Sweets and Desserts Corn syrup, sugars, honey, and molasses. Candy. Jam and jelly. Syrup. Sweetened cereals. Cookies, pies, cakes, donuts, muffins, and ice cream. Fats and Oils Butter, stick margarine, lard, shortening, ghee, or bacon fat. Coconut, palm kernel, or palm oils. Beverages Alcohol. Sweetened drinks (such as sodas, lemonade, and fruit drinks or punches). The items listed above may not be a complete list of foods and beverages to avoid. Contact your dietitian for more information.   This information is not intended to replace advice given to you by your health care provider. Make sure you discuss any questions you have with your health  care provider.   Document Released: 06/04/2012 Document Revised: 12/25/2014 Document Reviewed: 03/05/2014 Elsevier Interactive Patient Education 2016 [place].  Diabetes and Foot Care Diabetes may cause you to have problems because of poor blood supply (circulation) to your feet and legs. This may cause the skin on your feet to become thinner, break easier, and heal more slowly. Your skin may become dry, and the skin may peel and crack. You may also have nerve damage in your legs and feet causing decreased feeling in them. You may not notice minor injuries to your feet that could lead to infections or more serious problems. Taking care of your feet is one of the most important things you can do for yourself.  HOME CARE INSTRUCTIONS  Wear shoes at all times, even in the house. Do not go barefoot. Bare feet are easily injured.  Check your feet daily for blisters, cuts, and redness. If you cannot see the bottom of your feet, use a mirror or ask someone for help.  Wash your feet with warm water (do not use hot water) and mild soap. Then pat your feet and the areas between your toes until they are completely dry. Do not soak your feet as this can dry your skin.  Apply a moisturizing lotion or petroleum jelly (that does not contain alcohol and is unscented) to the skin on your feet and to dry, brittle toenails. Do not apply lotion between your toes.  Trim your toenails straight across. Do not dig under them or around the cuticle. File the edges of your nails with an emery board or nail file.  Do not cut corns or calluses or try to remove them with medicine.  Wear clean socks or stockings every day. Make sure they are not too tight. Do not wear knee-high stockings since they may decrease blood flow to your legs.  Wear shoes that fit properly and have enough cushioning. To break in new shoes, wear them for just a few hours a day. This prevents you from injuring your feet. Always look in your  shoes before you put them on to be sure there are no objects inside.  Do not cross your legs. This may decrease the blood flow to your feet.  If you find a minor scrape, cut, or break in the skin on your feet, keep it and the skin around it clean and dry. These areas may be cleansed with mild soap and water. Do not cleanse the area with peroxide, alcohol, or iodine.  When you remove an adhesive bandage, be sure not to damage the skin around it.  If you have a wound, look at it several times a day to make sure it is healing.  Do not use heating pads or hot water bottles. They may burn your skin. If you have lost feeling in your feet or legs, you may not know it is happening until it is too late.  Make sure your health care provider performs a complete foot exam at least annually or more often if you have foot problems. Report any cuts, sores, or bruises to your health care provider immediately. SEEK MEDICAL CARE IF:   You have an injury that is not healing.  You have cuts or breaks in the skin.  You have an ingrown nail.  You notice redness on your legs or feet.  You feel burning or tingling in your legs or feet.  You have pain or cramps in your legs and feet.  Your legs or feet are numb.  Your feet always feel cold. SEEK IMMEDIATE MEDICAL CARE IF:   There is increasing redness, swelling, or pain in or around a wound.  There is a red line that goes up your leg.  Pus is coming from a wound.  You develop a fever or as directed by your health care provider.  You notice a bad smell coming from an ulcer or wound.   This information is not intended to replace advice given to you by your health care provider. Make sure you discuss any questions you have with your health care provider.   Document Released: 12/01/2000 Document Revised: 08/06/2013 Document Reviewed: 05/13/2013 Elsevier Interactive Patient Education 2016 [place] in the Home  Falls can  cause injuries. They can happen to people of all ages. There are many things you can do to make your home safe and to help prevent falls.  WHAT CAN I DO ON THE OUTSIDE OF MY HOME?  Regularly fix the  edges of walkways and driveways and fix any cracks.  Remove anything that might make you trip as you walk through a door, such as a raised step or threshold.  Trim any bushes or trees on the path to your home.  Use bright outdoor lighting.  Clear any walking paths of anything that might make someone trip, such as rocks or tools.  Regularly check to see if handrails are loose or broken. Make sure that both sides of any steps have handrails.  Any raised decks and porches should have guardrails on the edges.  Have any leaves, snow, or ice cleared regularly.  Use sand or salt on walking paths during winter.  Clean up any spills in your garage right away. This includes oil or grease spills. WHAT CAN I DO IN THE BATHROOM?   Use night lights.  Install grab bars by the toilet and in the tub and shower. Do not use towel bars as grab bars.  Use non-skid mats or decals in the tub or shower.  If you need to sit down in the shower, use a plastic, non-slip stool.  Keep the floor dry. Clean up any water that spills on the floor as soon as it happens.  Remove soap buildup in the tub or shower regularly.  Attach bath mats securely with double-sided non-slip rug tape.  Do not have throw rugs and other things on the floor that can make you trip. WHAT CAN I DO IN THE BEDROOM?  Use night lights.  Make sure that you have a light by your bed that is easy to reach.  Do not use any sheets or blankets that are too big for your bed. They should not hang down onto the floor.  Have a firm chair that has side arms. You can use this for support while you get dressed.  Do not have throw rugs and other things on the floor that can make you trip. WHAT CAN I DO IN THE KITCHEN?  Clean up any spills right  away.  Avoid walking on wet floors.  Keep items that you use a lot in easy-to-reach places.  If you need to reach something above you, use a strong step stool that has a grab bar.  Keep electrical cords out of the way.  Do not use floor polish or wax that makes floors slippery. If you must use wax, use non-skid floor wax.  Do not have throw rugs and other things on the floor that can make you trip. WHAT CAN I DO WITH MY STAIRS?  Do not leave any items on the stairs.  Make sure that there are handrails on both sides of the stairs and use them. Fix handrails that are broken or loose. Make sure that handrails are as long as the stairways.  Check any carpeting to make sure that it is firmly attached to the stairs. Fix any carpet that is loose or worn.  Avoid having throw rugs at the top or bottom of the stairs. If you do have throw rugs, attach them to the floor with carpet tape.  Make sure that you have a light switch at the top of the stairs and the bottom of the stairs. If you do not have them, ask someone to add them for you. WHAT ELSE CAN I DO TO HELP PREVENT FALLS?  Wear shoes that:  Do not have high heels.  Have rubber bottoms.  Are comfortable and fit you well.  Are closed at the toe. Do  not wear sandals.  If you use a stepladder:  Make sure that it is fully opened. Do not climb a closed stepladder.  Make sure that both sides of the stepladder are locked into place.  Ask someone to hold it for you, if possible.  Clearly mark and make sure that you can see:  Any grab bars or handrails.  First and last steps.  Where the edge of each step is.  Use tools that help you move around (mobility aids) if they are needed. These include:  Canes.  Walkers.  Scooters.  Crutches.  Turn on the lights when you go into a dark area. Replace any light bulbs as soon as they burn out.  Set up your furniture so you have a clear path. Avoid moving your furniture  around.  If any of your floors are uneven, fix them.  If there are any pets around you, be aware of where they are.  Review your medicines with your doctor. Some medicines can make you feel dizzy. This can increase your chance of falling. Ask your doctor what other things that you can do to help prevent falls.   This information is not intended to replace advice given to you by your health care provider. Make sure you discuss any questions you have with your health care provider.   Document Released: 09/30/2009 Document Revised: 04/20/2015 Document Reviewed: 01/08/2015 Elsevier Interactive Patient Education 2016 [place] Maintenance, Female Adopting a healthy lifestyle and getting preventive care can go a long way to promote health and wellness. Talk with your health care provider about what schedule of regular examinations is right for you. This is a good chance for you to check in with your provider about disease prevention and staying healthy. In between checkups, there are plenty of things you can do on your own. Experts have done a lot of research about which lifestyle changes and preventive measures are most likely to keep you healthy. Ask your health care provider for more information. WEIGHT AND DIET  Eat a healthy diet  Be sure to include plenty of vegetables, fruits, low-fat dairy products, and lean protein.  Do not eat a lot of foods high in solid fats, added sugars, or salt.  Get regular exercise. This is one of the most important things you can do for your health.  Most adults should exercise for at least 150 minutes each week. The exercise should increase your heart rate and make you sweat (moderate-intensity exercise).  Most adults should also do strengthening exercises at least twice a week. This is in addition to the moderate-intensity exercise.  Maintain a healthy weight  Body mass index (BMI) is a measurement that can be used to identify possible  weight problems. It estimates body fat based on height and weight. Your health care provider can help determine your BMI and help you achieve or maintain a healthy weight.  For females 59 years of age and older:   A BMI below 18.5 is considered underweight.  A BMI of 18.5 to 24.9 is normal.  A BMI of 25 to 29.9 is considered overweight.  A BMI of 30 and above is considered obese.  Watch levels of cholesterol and blood lipids  You should start having your blood tested for lipids and cholesterol at 66 years of age, then have this test every 5 years.  You may need to have your cholesterol levels checked more often if:  Your lipid or cholesterol levels are high.  You are older than 66 years of age.  You are at high risk for heart disease.  CANCER SCREENING   Lung Cancer  Lung cancer screening is recommended for adults 82-63 years old who are at high risk for lung cancer because of a history of smoking.  A yearly low-dose CT scan of the lungs is recommended for people who:  Currently smoke.  Have quit within the past 15 years.  Have at least a 30-pack-year history of smoking. A pack year is smoking an average of one pack of cigarettes a day for 1 year.  Yearly screening should continue until it has been 15 years since you quit.  Yearly screening should stop if you develop a health problem that would prevent you from having lung cancer treatment.  Breast Cancer  Practice breast self-awareness. This means understanding how your breasts normally appear and feel.  It also means doing regular breast self-exams. Let your health care provider know about any changes, no matter how small.  If you are in your 20s or 30s, you should have a clinical breast exam (CBE) by a health care provider every 1-3 years as part of a regular health exam.  If you are 73 or older, have a CBE every year. Also consider having a breast X-ray (mammogram) every year.  If you have a family history of  breast cancer, talk to your health care provider about genetic screening.  If you are at high risk for breast cancer, talk to your health care provider about having an MRI and a mammogram every year.  Breast cancer gene (BRCA) assessment is recommended for women who have family members with BRCA-related cancers. BRCA-related cancers include:  Breast.  Ovarian.  Tubal.  Peritoneal cancers.  Results of the assessment will determine the need for genetic counseling and BRCA1 and BRCA2 testing. Cervical Cancer Your health care provider may recommend that you be screened regularly for cancer of the pelvic organs (ovaries, uterus, and vagina). This screening involves a pelvic examination, including checking for microscopic changes to the surface of your cervix (Pap test). You may be encouraged to have this screening done every 3 years, beginning at age 37.  For women ages 23-65, health care providers may recommend pelvic exams and Pap testing every 3 years, or they may recommend the Pap and pelvic exam, combined with testing for human papilloma virus (HPV), every 5 years. Some types of HPV increase your risk of cervical cancer. Testing for HPV may also be done on women of any age with unclear Pap test results.  Other health care providers may not recommend any screening for nonpregnant women who are considered low risk for pelvic cancer and who do not have symptoms. Ask your health care provider if a screening pelvic exam is right for you.  If you have had past treatment for cervical cancer or a condition that could lead to cancer, you need Pap tests and screening for cancer for at least 20 years after your treatment. If Pap tests have been discontinued, your risk factors (such as having a new sexual partner) need to be reassessed to determine if screening should resume. Some women have medical problems that increase the chance of getting cervical cancer. In these cases, your health care provider may  recommend more frequent screening and Pap tests. Colorectal Cancer  This type of cancer can be detected and often prevented.  Routine colorectal cancer screening usually begins at 66 years of age and continues through 66 years of  age.  Your health care provider may recommend screening at an earlier age if you have risk factors for colon cancer.  Your health care provider may also recommend using home test kits to check for hidden blood in the stool.  A small camera at the end of a tube can be used to examine your colon directly (sigmoidoscopy or colonoscopy). This is done to check for the earliest forms of colorectal cancer.  Routine screening usually begins at age 70.  Direct examination of the colon should be repeated every 5-10 years through 66 years of age. However, you may need to be screened more often if early forms of precancerous polyps or small growths are found. Skin Cancer  Check your skin from head to toe regularly.  Tell your health care provider about any new moles or changes in moles, especially if there is a change in a mole's shape or color.  Also tell your health care provider if you have a mole that is larger than the size of a pencil eraser.  Always use sunscreen. Apply sunscreen liberally and repeatedly throughout the day.  Protect yourself by wearing long sleeves, pants, a wide-brimmed hat, and sunglasses whenever you are outside. HEART DISEASE, DIABETES, AND HIGH BLOOD PRESSURE   High blood pressure causes heart disease and increases the risk of stroke. High blood pressure is more likely to develop in:  People who have blood pressure in the high end of the normal range (130-139/85-89 mm Hg).  People who are overweight or obese.  People who are African American.  If you are 26-20 years of age, have your blood pressure checked every 3-5 years. If you are 83 years of age or older, have your blood pressure checked every year. You should have your blood  pressure measured twice--once when you are at a hospital or clinic, and once when you are not at a hospital or clinic. Record the average of the two measurements. To check your blood pressure when you are not at a hospital or clinic, you can use:  An automated blood pressure machine at a pharmacy.  A home blood pressure monitor.  If you are between 31 years and 43 years old, ask your health care provider if you should take aspirin to prevent strokes.  Have regular diabetes screenings. This involves taking a blood sample to check your fasting blood sugar level.  If you are at a normal weight and have a low risk for diabetes, have this test once every three years after 66 years of age.  If you are overweight and have a high risk for diabetes, consider being tested at a younger age or more often. PREVENTING INFECTION  Hepatitis B  If you have a higher risk for hepatitis B, you should be screened for this virus. You are considered at high risk for hepatitis B if:  You were born in a country where hepatitis B is common. Ask your health care provider which countries are considered high risk.  Your parents were born in a high-risk country, and you have not been immunized against hepatitis B (hepatitis B vaccine).  You have HIV or AIDS.  You use needles to inject street drugs.  You live with someone who has hepatitis B.  You have had sex with someone who has hepatitis B.  You get hemodialysis treatment.  You take certain medicines for conditions, including cancer, organ transplantation, and autoimmune conditions. Hepatitis C  Blood testing is recommended for:  Everyone born from [birth year] through  [place] with known risk factors for hepatitis C. Sexually transmitted infections (STIs)  You should be screened for sexually transmitted infections (STIs) including gonorrhea and chlamydia if:  You are sexually active and are younger than 66 years of age.  You are older than 66 years  of age and your health care provider tells you that you are at risk for this type of infection.  Your sexual activity has changed since you were last screened and you are at an increased risk for chlamydia or gonorrhea. Ask your health care provider if you are at risk.  If you do not have HIV, but are at risk, it may be recommended that you take a prescription medicine daily to prevent HIV infection. This is called pre-exposure prophylaxis (PrEP). You are considered at risk if:  You are sexually active and do not regularly use condoms or know the HIV status of your partner(s).  You take drugs by injection.  You are sexually active with a partner who has HIV. Talk with your health care provider about whether you are at high risk of being infected with HIV. If you choose to begin PrEP, you should first be tested for HIV. You should then be tested every 3 months for as long as you are taking PrEP.  PREGNANCY   If you are premenopausal and you may become pregnant, ask your health care provider about preconception counseling.  If you may become pregnant, take 400 to 800 micrograms (mcg) of folic acid every day.  If you want to prevent pregnancy, talk to your health care provider about birth control (contraception). OSTEOPOROSIS AND MENOPAUSE   Osteoporosis is a disease in which the bones lose minerals and strength with aging. This can result in serious bone fractures. Your risk for osteoporosis can be identified using a bone density scan.  If you are 90 years of age or older, or if you are at risk for osteoporosis and fractures, ask your health care provider if you should be screened.  Ask your health care provider whether you should take a calcium or vitamin D supplement to lower your risk for osteoporosis.  Menopause may have certain physical symptoms and risks.  Hormone replacement therapy may reduce some of these symptoms and risks. Talk to your health care provider about whether hormone  replacement therapy is right for you.  HOME CARE INSTRUCTIONS   Schedule regular health, dental, and eye exams.  Stay current with your immunizations.   Do not use any tobacco products including cigarettes, chewing tobacco, or electronic cigarettes.  If you are pregnant, do not drink alcohol.  If you are breastfeeding, limit how much and how often you drink alcohol.  Limit alcohol intake to no more than 1 drink per day for nonpregnant women. One drink equals 12 ounces of beer, 5 ounces of wine, or 1 ounces of hard liquor.  Do not use street drugs.  Do not share needles.  Ask your health care provider for help if you need support or information about quitting drugs.  Tell your health care provider if you often feel depressed.  Tell your health care provider if you have ever been abused or do not feel safe at home.   This information is not intended to replace advice given to you by your health care provider. Make sure you discuss any questions you have with your health care provider.   Document Released: 06/19/2011 Document Revised: 12/25/2014 Document Reviewed: 11/05/2013 Elsevier Interactive Patient Education [employer].

## 2016-07-21 LAB — LIPID PANEL
CHOL/HDL RATIO: 3.6 ratio (ref <=5.0)
CHOLESTEROL: 224 mg/dL — AB (ref 125–200)
HDL: 62 mg/dL (ref >=46)
LDL Cholesterol: 127 mg/dL (ref <130)
Triglycerides: 173 mg/dL — ABNORMAL HIGH (ref <150)
VLDL: 35 mg/dL — AB (ref <30)

## 2016-07-21 LAB — HIV ANTIBODY (ROUTINE TESTING W REFLEX): HIV: NONREACTIVE

## 2016-07-21 LAB — HEPATITIS C ANTIBODY: HCV Ab: NEGATIVE

## 2016-07-25 ENCOUNTER — Telehealth: Payer: Self-pay | Admitting: Student

## 2016-07-25 NOTE — Telephone Encounter (Signed)
Return call to patient regarding blood pressure medication.  Patient has been taken amlodipine twice a day.  Rx is written for once daily.  Patient is out of medication now due to her taking it incorrectly.  Patient requested a follow up appointment very soon.  They next appt with PCP was an afternoon, but patient refused needing an early morning appt.  Appt scheduled with Dr. Andria Frames on 08/02/16 at 8:30 AM.  Will forward to PCP.  Derl Barrow, RN

## 2016-07-25 NOTE — Telephone Encounter (Signed)
Pt misread the instructions on her bp medication, pt has been taking 1 in the morning and 1 at night, pt stated she should have only been taking 1 a day and now she is out of medication. Pt has not taken any bp medication since yesterday. Please advise. Thanks! ep

## 2016-07-26 ENCOUNTER — Ambulatory Visit: Payer: Medicare Other | Admitting: Physical Therapy

## 2016-07-26 ENCOUNTER — Telehealth: Payer: Self-pay | Admitting: Licensed Clinical Social Worker

## 2016-07-26 ENCOUNTER — Encounter: Payer: Self-pay | Admitting: Physical Therapy

## 2016-07-26 DIAGNOSIS — S5401XD Injury of ulnar nerve at forearm level, right arm, subsequent encounter: Secondary | ICD-10-CM

## 2016-07-26 DIAGNOSIS — M2560 Stiffness of unspecified joint, not elsewhere classified: Secondary | ICD-10-CM | POA: Diagnosis not present

## 2016-07-26 DIAGNOSIS — M25521 Pain in right elbow: Secondary | ICD-10-CM | POA: Diagnosis not present

## 2016-07-26 DIAGNOSIS — M25541 Pain in joints of right hand: Secondary | ICD-10-CM | POA: Diagnosis not present

## 2016-07-26 DIAGNOSIS — M25621 Stiffness of right elbow, not elsewhere classified: Secondary | ICD-10-CM | POA: Diagnosis not present

## 2016-07-26 DIAGNOSIS — M25531 Pain in right wrist: Secondary | ICD-10-CM | POA: Diagnosis not present

## 2016-07-26 MED ORDER — AMLODIPINE BESYLATE 10 MG PO TABS: 10.0000 mg | ORAL_TABLET | Freq: Every day | ORAL | 3 refills | Status: DC

## 2016-07-26 NOTE — Telephone Encounter (Signed)
Patient called again regarding her medication refill for her bp medication.  She states that she will call the pharmacy to see if she can get a couple of days worth since her provider has up to 48 hours to refill. Jazmin Hartsell,CMA

## 2016-07-26 NOTE — Progress Notes (Signed)
Follow up call to patient after [facility] consult on 07/20/16.  Patient states she is doing well. Patient will follow up with [facility] if needed. However states she does not need services at this time.    Casimer Lanius, LCSW Licensed Clinical Social Worker [facility]   [phone] 3:25 PM

## 2016-07-26 NOTE — Therapy (Signed)
[address] [place], [place], [address] Phone: [phone]   Fax:  [phone]  Physical Therapy Treatment  Patient Details  Name: Veronica Simmons MRN: [id number] Date of Birth: [birth year] Referring Provider: Lincoln [name]  Encounter Date: 07/26/2016      PT End of Session - 07/26/16 1100    Visit Number 7   Date for PT Re-Evaluation 08/08/16   Authorization Type medicad   PT Start Time 1015   PT Stop Time 1111   PT Time Calculation (min) 56 min   Activity Tolerance Patient tolerated treatment well   Behavior During Therapy [facility] for tasks assessed/performed      Past Medical History:  Diagnosis Date  . ALLERGIC RHINITIS 02/19/2007   Qualifier: Diagnosis of  By: Lorne Skeens MD, VALENICA    . Anemia 1970s   on iron   . Arthritis   . Cubital tunnel syndrome on right 10/31/2012   Secondary to old fracture. Evaluated by ortho. [facility] offered surgery but patient refused. Numbness and burning pain 4th and 5th digit.    . Diabetes mellitus 2000   Never on insulin   . Hypertension 2000    History reviewed. No pertinent surgical history.  There were no vitals filed for this visit.      Subjective Assessment - 07/26/16 1016    Subjective "Things are going well"   Currently in Pain? Yes   Pain Score 5    Pain Location Elbow   Pain Orientation Right                         [facility] Adult PT Treatment/Exercise - 07/26/16 0001      Shoulder Exercises: Standing   ABduction Both;10 reps;Weights   Shoulder ABduction Weight (lbs) 1   Other Standing Exercises bicep curls pulley 10lb 2x15; wall push ups 2x10   Other Standing Exercises Triceps press downs 15lb 2x10; standing rev grip rows 2x15     Shoulder Exercises: ROM/Strengthening   UBE (Upper Arm Bike) L 2.5 3 fwd/3 back   Other ROM/Strengthening Exercises lat pull 20lb  2 sets 15   Other ROM/Strengthening Exercises seated row  20lb 2 sets 15; chest press 15lb 2x15      Hand Exercises   Other Hand Exercises velcro board   Other Hand Exercises finger web; egg squeezes orange                  PT Short Term Goals - 06/23/16 0940      PT SHORT TERM GOAL #1   Title independent with intiial HEP   Status Achieved           PT Long Term Goals - 07/19/16 1052      PT LONG TERM GOAL #1   Title report pain and tingling decreased 50%   Status Partially Met     PT LONG TERM GOAL #2   Title report able to do her hair, she currently reports that she has to have someone do it for her   Status Partially Met     PT LONG TERM GOAL #4   Title be able to extend all fingers to neutral   Status On-going               Plan - 07/26/16 1101    Clinical Impression Statement Pt continues to progress towards goals, Difficulty remains with extending digits of RUE. Pt reports no  pain initially but exercises does wake up the pain. Tolerated additional interventions such as standing shoulder abduction. Pt also tolerated other interventions at an increase intensity.    Rehab Potential Good   PT Frequency 2x / week   PT Duration 8 weeks   PT Treatment/Interventions ADLs/Self Care Home Management;Electrical Stimulation;Moist Heat;Therapeutic exercise;Therapeutic activities;Parrafin;Ultrasound;Neuromuscular re-education;Patient/family education;Manual techniques   PT Next Visit Plan MT to extend digits       Patient will benefit from skilled therapeutic intervention in order to improve the following deficits and impairments:  Decreased coordination, Decreased range of motion, Decreased strength, Increased fascial restricitons, Increased muscle spasms, Impaired flexibility, Pain, Impaired UE functional use  Visit Diagnosis: Stiffness of right elbow, not elsewhere classified  Injury of right ulnar nerve, subsequent encounter  Joint pain in fingers of right hand     Problem List Patient Active Problem  List   Diagnosis Date Noted  . Skin lesion of right lower limb 06/19/2016  . Leg swelling 05/12/2016  . Subconjunctival hemorrhage of right eye 04/27/2016  . Diarrhea 01/09/2016  . Health care maintenance 06/28/2015  . Anemia 12/21/2014  . Palpitations 01/07/2014  . Recurrent yeast vaginitis 11/05/2013  . Recurrent genital herpes 07/17/2013  . Hot flashes not due to menopause 11/27/2012  . Cubital tunnel syndrome on right 10/31/2012  . Type 2 diabetes, controlled, with neuropathy ([place]) 02/14/2007  . HYPERLIPIDEMIA 02/14/2007  . OBESITY, NOS 02/14/2007  . HYPERTENSION, BENIGN SYSTEMIC 02/14/2007  . ARTHRITIS 02/14/2007  . Insomnia 02/14/2007    Scot Jun, PTA  07/26/2016, 11:07 AM  [address] [place], [place], [address] Phone: [phone]   Fax:  [phone]  Name: Veronica Simmons MRN: [id number] Date of Birth: [birth year]

## 2016-07-28 ENCOUNTER — Telehealth: Payer: Self-pay | Admitting: Student

## 2016-07-28 MED ORDER — AMLODIPINE BESYLATE 10 MG PO TABS: 10.0000 mg | ORAL_TABLET | Freq: Every day | ORAL | 3 refills | Status: DC

## 2016-07-28 NOTE — Telephone Encounter (Signed)
Pt called because her insurance will not pay for the prescription since it is not due to be filled until the 13 th of August. The pharmacy said that they would not give her enough to last until then and the prescription is over 100.00. She is not sure what to do until then since her BP is very high. Please call to discuss. jw

## 2016-07-28 NOTE — Telephone Encounter (Signed)
Patient called regarding norvasc prescription. She ran out of norvasc 3-4 days ago. It was written to be taken 10 mg qD but she had been taking 10 mg twice daily mistakenly. She states the pharmacist will give her more medication early. She does heck her blood pressure and states it has been running in the 170s-180s SBP. She denies headache, shortness of breath, chest pain. Additional norvasc was sent to her pharmacy and she was asked to see a provider in the coming week. After calling the pharmacy, it seems she woould have to pay out of pocket ( $160) if given early. However if given enough to last until her insurance would pay for a refill ( 9 tabs until 8/20) she would only have to pay $11. She was informed of this and she stated she would be able to do this.  She has an appointment on 8/18 at which time her blood pressure should be discussed. She is currently on lisinopril, Norvasc for BP. Consider adding HCTZ at next visit if BP still elevated.

## 2016-08-01 ENCOUNTER — Ambulatory Visit: Payer: Medicare Other | Admitting: Physical Therapy

## 2016-08-01 ENCOUNTER — Encounter: Payer: Self-pay | Admitting: Physical Therapy

## 2016-08-01 DIAGNOSIS — M25621 Stiffness of right elbow, not elsewhere classified: Secondary | ICD-10-CM

## 2016-08-01 DIAGNOSIS — S5401XD Injury of ulnar nerve at forearm level, right arm, subsequent encounter: Secondary | ICD-10-CM | POA: Diagnosis not present

## 2016-08-01 DIAGNOSIS — M25541 Pain in joints of right hand: Secondary | ICD-10-CM | POA: Diagnosis not present

## 2016-08-01 DIAGNOSIS — M25531 Pain in right wrist: Secondary | ICD-10-CM | POA: Diagnosis not present

## 2016-08-01 DIAGNOSIS — M25521 Pain in right elbow: Secondary | ICD-10-CM | POA: Diagnosis not present

## 2016-08-01 DIAGNOSIS — M2560 Stiffness of unspecified joint, not elsewhere classified: Secondary | ICD-10-CM | POA: Diagnosis not present

## 2016-08-01 NOTE — Therapy (Signed)
[place] [address] [place] [place], [place], [address] Phone: [phone]   Fax:  [phone]  Physical Therapy Treatment  Patient Details  Name: Veronica Simmons MRN: [id number] Date of Birth: 06/29/1951 Referring Provider: Lincoln [name]  Encounter Date: 08/01/2016      PT End of Session - 08/01/16 0845    Visit Number 8   Date for PT Re-Evaluation 08/08/16   Authorization Type medicad   PT Start Time 0808   PT Stop Time 0859   PT Time Calculation (min) 51 min   Activity Tolerance Patient tolerated treatment well   Behavior During Therapy [facility] for tasks assessed/performed      Past Medical History:  Diagnosis Date  . ALLERGIC RHINITIS 02/19/2007   Qualifier: Diagnosis of  By: Lorne Skeens MD, VALENICA    . Anemia 1970s   on iron   . Arthritis   . Cubital tunnel syndrome on right 10/31/2012   Secondary to old fracture. Evaluated by ortho. [facility] offered surgery but patient refused. Numbness and burning pain 4th and 5th digit.    . Diabetes mellitus 2000   Never on insulin   . Hypertension 2000    History reviewed. No pertinent surgical history.  There were no vitals filed for this visit.      Subjective Assessment - 08/01/16 0810    Subjective "Its going better"   Currently in Pain? No/denies   Pain Score 0-No pain                         [facility] Adult PT Treatment/Exercise - 08/01/16 0001      Shoulder Exercises: Standing   Row Right;15 reps  x2   Row Weight (lbs) 10lb   Other Standing Exercises Wall push ups x15      Shoulder Exercises: ROM/Strengthening   UBE (Upper Arm Bike) L 2.5 3 fwd/3 back   Other ROM/Strengthening Exercises lat pull 25lb  2 sets 15   Other ROM/Strengthening Exercises seated row 25lb 2 sets 15; chest press 15lb 2x15      Hand Exercises   Other Hand Exercises finger web; egg squeezes orange     Modalities   Modalities Paraffin;Moist Heat;Electrical  Stimulation     Electrical Stimulation   Electrical Stimulation Location right elbow area   Electrical Stimulation Action IFC   Electrical Stimulation Parameters sitting   Electrical Stimulation Goals Pain     RUE Paraffin   Number Minutes Paraffin 15 Minutes   RUE Paraffin Location Hand     Manual Therapy   Manual Therapy Passive ROM   Passive ROM R elbow and hand                   PT Short Term Goals - 06/23/16 0940      PT SHORT TERM GOAL #1   Title independent with intiial HEP   Status Achieved           PT Long Term Goals - 07/19/16 1052      PT LONG TERM GOAL #1   Title report pain and tingling decreased 50%   Status Partially Met     PT LONG TERM GOAL #2   Title report able to do her hair, she currently reports that she has to have someone do it for her   Status Partially Met     PT LONG TERM GOAL #4   Title be able to extend all  fingers to neutral   Status On-going               Plan - 08/01/16 0847    Clinical Impression Statement PT ~ 9 minutes this date, She reports that she is 10% better. Pt still unable to extend digits of R hand. Enters clinic pain free but reports pain when warming up on UBE. Pt reports that she does have some difficulty at home cooking and fixing things.   Rehab Potential Good   PT Frequency 2x / week   PT Duration 8 weeks   PT Treatment/Interventions ADLs/Self Care Home Management;Electrical Stimulation;Moist Heat;Therapeutic exercise;Therapeutic activities;Parrafin;Ultrasound;Neuromuscular re-education;Patient/family education;Manual techniques   PT Next Visit Plan MT to extend digits       Patient will benefit from skilled therapeutic intervention in order to improve the following deficits and impairments:  Decreased coordination, Decreased range of motion, Decreased strength, Increased fascial restricitons, Increased muscle spasms, Impaired flexibility, Pain, Impaired UE functional use  Visit  Diagnosis: Stiffness of right elbow, not elsewhere classified  Injury of right ulnar nerve, subsequent encounter  Joint pain in fingers of right hand     Problem List Patient Active Problem List   Diagnosis Date Noted  . Skin lesion of right lower limb 06/19/2016  . Leg swelling 05/12/2016  . Subconjunctival hemorrhage of right eye 04/27/2016  . Diarrhea 01/09/2016  . Health care maintenance 06/28/2015  . Anemia 12/21/2014  . Palpitations 01/07/2014  . Recurrent yeast vaginitis 11/05/2013  . Recurrent genital herpes 07/17/2013  . Hot flashes not due to menopause 11/27/2012  . Cubital tunnel syndrome on right 10/31/2012  . Type 2 diabetes, controlled, with neuropathy ([place]) 02/14/2007  . HYPERLIPIDEMIA 02/14/2007  . OBESITY, NOS 02/14/2007  . HYPERTENSION, BENIGN SYSTEMIC 02/14/2007  . ARTHRITIS 02/14/2007  . Insomnia 02/14/2007    Scot Jun, PTA  08/01/2016, 8:49 AM  [address] [place], [place], [address] Phone: [phone]   Fax:  [phone]  Name: Veronica Simmons MRN: [id number] Date of Birth: [birth year]

## 2016-08-02 ENCOUNTER — Ambulatory Visit: Payer: Medicare Other | Admitting: Family Medicine

## 2016-08-03 ENCOUNTER — Encounter: Payer: Self-pay | Admitting: Physical Therapy

## 2016-08-03 ENCOUNTER — Ambulatory Visit: Payer: Medicare Other | Admitting: Physical Therapy

## 2016-08-03 DIAGNOSIS — M25541 Pain in joints of right hand: Secondary | ICD-10-CM

## 2016-08-03 DIAGNOSIS — M2560 Stiffness of unspecified joint, not elsewhere classified: Secondary | ICD-10-CM | POA: Diagnosis not present

## 2016-08-03 DIAGNOSIS — M25621 Stiffness of right elbow, not elsewhere classified: Secondary | ICD-10-CM

## 2016-08-03 DIAGNOSIS — S5401XD Injury of ulnar nerve at forearm level, right arm, subsequent encounter: Secondary | ICD-10-CM

## 2016-08-03 DIAGNOSIS — M25521 Pain in right elbow: Secondary | ICD-10-CM | POA: Diagnosis not present

## 2016-08-03 DIAGNOSIS — M25531 Pain in right wrist: Secondary | ICD-10-CM

## 2016-08-03 NOTE — Therapy (Signed)
[place] [address] [place], [place], [address] Phone: [phone]   Fax:  [phone]  Physical Therapy Treatment  Patient Details  Name: Veronica Simmons MRN: [id number] Date of Birth: 02-10-1951 Referring Provider: Lincoln [name]  Encounter Date: 08/03/2016      PT End of Session - 08/03/16 0838    Visit Number 9   Authorization Type medicad   PT Start Time 0800   PT Stop Time 0855   PT Time Calculation (min) 55 min   Activity Tolerance Patient tolerated treatment well   Behavior During Therapy [facility] for tasks assessed/performed      Past Medical History:  Diagnosis Date  . ALLERGIC RHINITIS 02/19/2007   Qualifier: Diagnosis of  By: Lorne Skeens MD, VALENICA    . Anemia 1970s   on iron   . Arthritis   . Cubital tunnel syndrome on right 10/31/2012   Secondary to old fracture. Evaluated by ortho. [facility] offered surgery but patient refused. Numbness and burning pain 4th and 5th digit.    . Diabetes mellitus 2000   Never on insulin   . Hypertension 2000    History reviewed. No pertinent surgical history.  There were no vitals filed for this visit.      Subjective Assessment - 08/03/16 0802    Subjective "Im doing fine"   Currently in Pain? --  "Just throbbing in this hand"                         [facility] Adult PT Treatment/Exercise - 08/03/16 0001      Shoulder Exercises: Standing   Other Standing Exercises Wall push ups x15    Other Standing Exercises Triceps press downs 20lb 3x10; standing rev grip rows 2x15; Biceps curle 15lb 3x10     Shoulder Exercises: ROM/Strengthening   UBE (Upper Arm Bike) NuStep L4 x6 min    Other ROM/Strengthening Exercises lat pull 25lb  3 sets 10   Other ROM/Strengthening Exercises seated row 25lb 2 sets 15; chest press 15lb 2x15      Hand Exercises   Other Hand Exercises Finger flex and ext 2x15;    Other Hand Exercises finger web; egg squeezes  orange     Modalities   Modalities Paraffin;Moist Heat;Electrical Stimulation     Electrical Stimulation   Electrical Stimulation Location right elbow area   Electrical Stimulation Action IFC   Electrical Stimulation Parameters sitting   Electrical Stimulation Goals Pain     RUE Paraffin   Number Minutes Paraffin 15 Minutes   RUE Paraffin Location Hand     Manual Therapy   Manual Therapy Passive ROM   Passive ROM R elbow and hand                   PT Short Term Goals - 06/23/16 0940      PT SHORT TERM GOAL #1   Title independent with intiial HEP   Status Achieved           PT Long Term Goals - 07/19/16 1052      PT LONG TERM GOAL #1   Title report pain and tingling decreased 50%   Status Partially Met     PT LONG TERM GOAL #2   Title report able to do her hair, she currently reports that she has to have someone do it for her   Status Partially Met     PT LONG TERM  GOAL #4   Title be able to extend all fingers to neutral   Status On-going               Plan - 08/03/16 2009    Clinical Impression Statement ROM in R hand remains limited, Pt unable to extend digits in R hand. Some difficulty grabbing handles with seated rows and lat pull downs. Advised pt to call her hand doctor this afternoon   Rehab Potential Good   PT Frequency 2x / week   PT Duration 8 weeks   PT Next Visit Plan MT to extend digits       Patient will benefit from skilled therapeutic intervention in order to improve the following deficits and impairments:  Decreased coordination, Decreased range of motion, Decreased strength, Increased fascial restricitons, Increased muscle spasms, Impaired flexibility, Pain, Impaired UE functional use  Visit Diagnosis: Stiffness of right elbow, not elsewhere classified  Injury of right ulnar nerve, subsequent encounter  Joint pain in fingers of right hand  Pain in right wrist     Problem List Patient Active Problem List    Diagnosis Date Noted  . Skin lesion of right lower limb 06/19/2016  . Leg swelling 05/12/2016  . Subconjunctival hemorrhage of right eye 04/27/2016  . Diarrhea 01/09/2016  . Health care maintenance 06/28/2015  . Anemia 12/21/2014  . Palpitations 01/07/2014  . Recurrent yeast vaginitis 11/05/2013  . Recurrent genital herpes 07/17/2013  . Hot flashes not due to menopause 11/27/2012  . Cubital tunnel syndrome on right 10/31/2012  . Type 2 diabetes, controlled, with neuropathy ([place]) 02/14/2007  . HYPERLIPIDEMIA 02/14/2007  . OBESITY, NOS 02/14/2007  . HYPERTENSION, BENIGN SYSTEMIC 02/14/2007  . ARTHRITIS 02/14/2007  . Insomnia 02/14/2007    Scot Jun, PTA  08/03/2016, 8:41 AM  [address] [place], [place], [address] Phone: [phone]   Fax:  [phone]  Name: Veronica Simmons MRN: [id number] Date of Birth: [birth year]

## 2016-08-04 ENCOUNTER — Other Ambulatory Visit: Payer: Self-pay | Admitting: Family Medicine

## 2016-08-04 ENCOUNTER — Ambulatory Visit (INDEPENDENT_AMBULATORY_CARE_PROVIDER_SITE_OTHER): Payer: Medicare Other | Admitting: Family Medicine

## 2016-08-04 ENCOUNTER — Encounter: Payer: Self-pay | Admitting: Family Medicine

## 2016-08-04 DIAGNOSIS — R238 Other skin changes: Secondary | ICD-10-CM | POA: Insufficient documentation

## 2016-08-04 DIAGNOSIS — L989 Disorder of the skin and subcutaneous tissue, unspecified: Secondary | ICD-10-CM

## 2016-08-04 DIAGNOSIS — I10 Essential (primary) hypertension: Secondary | ICD-10-CM | POA: Diagnosis not present

## 2016-08-04 MED ORDER — INDAPAMIDE 2.5 MG PO TABS: 2.5000 mg | ORAL_TABLET | Freq: Every day | ORAL | 1 refills | Status: DC

## 2016-08-04 NOTE — Patient Instructions (Signed)
  Please start taking Indapamide 2.5 mg every day  Put cortisone cream on itchy bumps  Please come back in 1-2 weeks for nurse visit to check blood pressure.  Come back in 1 month to see Dr. Lincoln [name] (PCP) for high blood pressure/edema.

## 2016-08-04 NOTE — Assessment & Plan Note (Addendum)
Pruritic papules 1 on leg, 2 on back, she first noticed in May. No signs of infection, unsure of etiology.   -advised to use over the counter cortisone cream -follow up in 1 month w/ PCP if cream does not resolve/improve bumps

## 2016-08-04 NOTE — Progress Notes (Signed)
    Subjective:    Patient ID: Veronica Simmons, female    DOB: [birth year], 65 y.o.   MRN: [id number]   CC: high blood pressure  Hypertension Patient reports difficulty controlling her blood pressure. She ran out of Norvasc10mg  for 5-6 days about a week ago due to taking it twice a day instead of once a day. She has refilled this medication and is now taking it daily as instructed. She also takes lisinopril 20mg  daily. However, this morning, she only took lisinopril, has not taken Norvasc yet. She checks her blood pressure at home with a new machine she ordered but she is unsure if it is accurate because the readings are always very high, 0000000 systolic. Her daughter will also manually check the blood pressure and gets 123456 systolic. She denies headache, chest pain, shortness of breath. She does have some blurry vision, reports leg swelling, feels "off balance".   Itchy bumps Also complaining of itchy bumps that have come and gone since May, about 4 total. She started taking a "liver cleanse" in May- unknown herbal supplement, and shortly after noticed some small itchy bumps on her legs and back. They do not hurt. She stopped the supplement but the bumps remain. She uses icy-hot on them with some relief of itching. The bump will resolve but then return again in the same place. There have not been an increase in number of bumps, just recurrence of 2 on her back and 1-2 on her upper legs.  Smoking status reviewed- never smoker  Review of Systems- see HPI  Objective:  BP (!) 178/72   Pulse 80   Temp 97.7 F (36.5 C) (Oral)   Ht 5\' 6"  (1.676 m)   Wt 219 lb (99.3 kg)   BMI 35.35 kg/m  Vitals and nursing note reviewed  General: well nourished, in no acute distress HEENT: normocephalic, no scleral icterus or conjunctival pallor, no nasal discharge, moist mucous membranes Cardiac: RRR, clear S1 and S2, no murmurs, rubs, or gallops. +2 radial pulses bilaterally Respiratory: clear to  auscultation bilaterally, no increased work of breathing Extremities: +1 pitting edema bilaterally halfway up calves. Normal strength bilaterally. Neuro: alert and oriented, no focal deficits   Assessment & Plan:    HYPERTENSION, BENIGN SYSTEMIC  Not at goal, patient did not take norvasc this morning, unsure what BP is with both medications but suspect it would not be at goal regardless. No signs of end organ damage. Will add thiazide diuretic on to regimen to help lower bp and decrease leg edema.  -Added indapamide 2.5 mg daily, patient advised this may make her urinate more as it removes water from her body -will return in 1-2 weeks for nurse visit bp check -see PCP in 1 month to follow up on bp control  Skin papules, generalized Pruritic papules 1 on leg, 2 on back, she first noticed in May. No signs of infection, unsure of etiology.   -advised to use over the counter cortisone cream -follow up in 1 month w/ PCP if cream does not resolve/improve bumps   Lucila Maine, DO Family Medicine Resident PGY-1

## 2016-08-04 NOTE — Assessment & Plan Note (Addendum)
  Not at goal, patient did not take norvasc this morning, unsure what BP is with both medications but suspect it would not be at goal regardless. No signs of end organ damage. Will add thiazide diuretic on to regimen to help lower bp and decrease leg edema.  -Added indapamide 2.5 mg daily, patient advised this may make her urinate more as it removes water from her body -will return in 1-2 weeks for nurse visit bp check -see PCP in 1 month to follow up on bp control

## 2016-08-11 ENCOUNTER — Encounter: Payer: Self-pay | Admitting: Physical Therapy

## 2016-08-11 ENCOUNTER — Ambulatory Visit: Payer: Medicare Other | Admitting: Physical Therapy

## 2016-08-11 ENCOUNTER — Telehealth: Payer: Self-pay | Admitting: *Deleted

## 2016-08-11 DIAGNOSIS — M25541 Pain in joints of right hand: Secondary | ICD-10-CM

## 2016-08-11 DIAGNOSIS — S5401XD Injury of ulnar nerve at forearm level, right arm, subsequent encounter: Secondary | ICD-10-CM | POA: Diagnosis not present

## 2016-08-11 DIAGNOSIS — M2560 Stiffness of unspecified joint, not elsewhere classified: Secondary | ICD-10-CM | POA: Diagnosis not present

## 2016-08-11 DIAGNOSIS — M25621 Stiffness of right elbow, not elsewhere classified: Secondary | ICD-10-CM

## 2016-08-11 DIAGNOSIS — M25521 Pain in right elbow: Secondary | ICD-10-CM | POA: Diagnosis not present

## 2016-08-11 DIAGNOSIS — M25531 Pain in right wrist: Secondary | ICD-10-CM | POA: Diagnosis not present

## 2016-08-11 NOTE — Addendum Note (Signed)
Addended by: Sumner Boast on: 08/11/2016 09:12 AM   Modules accepted: Orders

## 2016-08-11 NOTE — Therapy (Signed)
[address] [place], [place], [address] Phone: [phone]   Fax:  [phone]  Physical Therapy Treatment  Patient Details  Name: Veronica Simmons MRN: [id number] Date of Birth: Feb 17, 1951 Referring Provider: Lincoln [name]  Encounter Date: 08/11/2016      PT End of Session - 08/11/16 0843    Visit Number 10   Date for PT Re-Evaluation 08/08/16   Authorization Type medicad   PT Start Time 0800   PT Stop Time 0855   PT Time Calculation (min) 55 min   Activity Tolerance Patient tolerated treatment well   Behavior During Therapy [facility] for tasks assessed/performed      Past Medical History:  Diagnosis Date  . ALLERGIC RHINITIS 02/19/2007   Qualifier: Diagnosis of  By: Lorne Skeens MD, VALENICA    . Anemia 1970s   on iron   . Arthritis   . Cubital tunnel syndrome on right 10/31/2012   Secondary to old fracture. Evaluated by ortho. [facility] offered surgery but patient refused. Numbness and burning pain 4th and 5th digit.    . Diabetes mellitus 2000   Never on insulin   . Hypertension 2000    History reviewed. No pertinent surgical history.  There were no vitals filed for this visit.      Subjective Assessment - 08/11/16 0801    Subjective "I can tell some improvement"   Currently in Pain? Yes   Pain Location Arm   Pain Orientation Right            [facility] PT Assessment - 08/11/16 0001      AROM   Overall AROM Comments AROM WFL     Strength   Overall Strength Comments right grip strength 50#, left 50#                     [facility] Adult PT Treatment/Exercise - 08/11/16 0001      Elbow Exercises   Other elbow exercises Velcro board      Shoulder Exercises: ROM/Strengthening   UBE (Upper Arm Bike) L3.5 14fd/3rev   Other ROM/Strengthening Exercises lat pull 25lb  3 sets 10   Other ROM/Strengthening Exercises seated row 25lb 2 sets 15; chest press 15lb 2x15      Hand  Exercises   Other Hand Exercises Finger flex and ext 2x15;    Other Hand Exercises finger web, flex and ext; egg squeezes blue                  PT Short Term Goals - 06/23/16 0940      PT SHORT TERM GOAL #1   Title independent with intiial HEP   Status Achieved           PT Long Term Goals - 07/19/16 1052      PT LONG TERM GOAL #1   Title report pain and tingling decreased 50%   Status Partially Met     PT LONG TERM GOAL #2   Title report able to do her hair, she currently reports that she has to have someone do it for her   Status Partially Met     PT LONG TERM GOAL #4   Title be able to extend all fingers to neutral   Status On-going               Plan - 08/11/16 0843    Clinical Impression Statement Pt reports that she can notice some improvement at  home. Digit ROM remain limited on R hand, pt is unable to extend digits. Pt with decrease motor control of 5th digit on R hand. Some assist needed for proper hand placement with today's interventions due to pt R hand complications.   Rehab Potential Good   PT Frequency 2x / week   PT Duration 8 weeks   PT Treatment/Interventions ADLs/Self Care Home Management;Electrical Stimulation;Moist Heat;Therapeutic exercise;Therapeutic activities;Parrafin;Ultrasound;Neuromuscular re-education;Patient/family education;Manual techniques   PT Next Visit Plan Advised pt to return to MD, D/C next visit.      Patient will benefit from skilled therapeutic intervention in order to improve the following deficits and impairments:  Decreased coordination, Decreased range of motion, Decreased strength, Increased fascial restricitons, Increased muscle spasms, Impaired flexibility, Pain, Impaired UE functional use  Visit Diagnosis: Injury of right ulnar nerve, subsequent encounter  Joint pain in fingers of right hand  Stiffness of right elbow, not elsewhere classified       G-Codes - 08-13-16 2706    Functional  Assessment Tool Used foto 49% limitation   Functional Limitation Other PT primary   Other PT Primary Current Status (C3762) At least 40 percent but less than 60 percent impaired, limited or restricted   Other PT Primary Goal Status (G3151) At least 40 percent but less than 60 percent impaired, limited or restricted      Problem List Patient Active Problem List   Diagnosis Date Noted  . Skin papules, generalized 08/04/2016  . Skin lesion of right lower limb 06/19/2016  . Leg swelling 05/12/2016  . Subconjunctival hemorrhage of right eye 04/27/2016  . Diarrhea 01/09/2016  . Health care maintenance 06/28/2015  . Anemia 12/21/2014  . Palpitations 01/07/2014  . Recurrent yeast vaginitis 11/05/2013  . Recurrent genital herpes 07/17/2013  . Hot flashes not due to menopause 11/27/2012  . Cubital tunnel syndrome on right 10/31/2012  . Type 2 diabetes, controlled, with neuropathy ([place]) 02/14/2007  . HYPERLIPIDEMIA 02/14/2007  . OBESITY, NOS 02/14/2007  . HYPERTENSION, BENIGN SYSTEMIC 02/14/2007  . ARTHRITIS 02/14/2007  . Insomnia 02/14/2007    Scot Jun, PTA  08-13-16, 8:47 AM  [address] [place], [place], [address] Phone: [phone]   Fax:  [phone]  Name: Veronica Simmons MRN: [id number] Date of Birth: 1951-07-08

## 2016-08-11 NOTE — Telephone Encounter (Signed)
Pt states that she needs another referral to continue her physical therapy as her authorizations are running out and she would like for Dr. Lincoln [name] to place a Bone density referral so she can call the breast center and schedule an appt.  Please let pt know when this is done. Fleeger, Salome Spotted, CMA

## 2016-08-14 ENCOUNTER — Telehealth: Payer: Self-pay | Admitting: Student

## 2016-08-14 DIAGNOSIS — M810 Age-related osteoporosis without current pathological fracture: Secondary | ICD-10-CM

## 2016-08-14 DIAGNOSIS — M79641 Pain in right hand: Secondary | ICD-10-CM

## 2016-08-14 DIAGNOSIS — G8929 Other chronic pain: Secondary | ICD-10-CM

## 2016-08-14 NOTE — Telephone Encounter (Signed)
Called patient  To inform her that dexa scan and PT referral were made. She expressed her understanding

## 2016-08-16 ENCOUNTER — Encounter: Payer: Self-pay | Admitting: Physical Therapy

## 2016-08-16 ENCOUNTER — Ambulatory Visit: Payer: Medicare Other | Admitting: Physical Therapy

## 2016-08-16 DIAGNOSIS — M2560 Stiffness of unspecified joint, not elsewhere classified: Secondary | ICD-10-CM | POA: Diagnosis not present

## 2016-08-16 DIAGNOSIS — M25541 Pain in joints of right hand: Secondary | ICD-10-CM | POA: Diagnosis not present

## 2016-08-16 DIAGNOSIS — M25521 Pain in right elbow: Secondary | ICD-10-CM | POA: Diagnosis not present

## 2016-08-16 DIAGNOSIS — S5401XD Injury of ulnar nerve at forearm level, right arm, subsequent encounter: Secondary | ICD-10-CM | POA: Diagnosis not present

## 2016-08-16 DIAGNOSIS — M25531 Pain in right wrist: Secondary | ICD-10-CM

## 2016-08-16 DIAGNOSIS — M25621 Stiffness of right elbow, not elsewhere classified: Secondary | ICD-10-CM | POA: Diagnosis not present

## 2016-08-16 NOTE — Telephone Encounter (Signed)
Called pt to inform of completion of referrals.  She is grateful but then states that the physical therapist said the referral would need to come from her Hand surgeon and that she needed to followup with him.     Advised that she should be able to call and get an appointment since she is established there but to call back if there is an issue.  Pt is agreeable. Zinia Innocent, Salome Spotted, CMA

## 2016-08-16 NOTE — Therapy (Signed)
[place] [place], [place], [address] Phone: [phone]   Fax:  [phone]  Physical Therapy Treatment  Patient Details  Name: Veronica Simmons MRN: [id number] Date of Birth: 1951/04/28 Referring Provider: Lincoln [name]  Encounter Date: 08/16/2016      PT End of Session - 08/16/16 0931    Visit Number 11   Date for PT Re-Evaluation 08/08/16   Authorization Type medicad   PT Start Time 0848   PT Stop Time 0944   PT Time Calculation (min) 56 min   Activity Tolerance Patient tolerated treatment well   Behavior During Therapy [facility] for tasks assessed/performed      Past Medical History:  Diagnosis Date  . ALLERGIC RHINITIS 02/19/2007   Qualifier: Diagnosis of  By: Lorne Skeens MD, VALENICA    . Anemia 1970s   on iron   . Arthritis   . Cubital tunnel syndrome on right 10/31/2012   Secondary to old fracture. Evaluated by ortho. [facility] offered surgery but patient refused. Numbness and burning pain 4th and 5th digit.    . Diabetes mellitus 2000   Never on insulin   . Hypertension 2000    History reviewed. No pertinent surgical history.  There were no vitals filed for this visit.      Subjective Assessment - 08/16/16 0850    Subjective "Things are going well"   Currently in Pain? No/denies   Pain Score 0-No pain            [facility] PT Assessment - 08/16/16 0001      AROM   Overall AROM Comments AROM WFL     Strength   Overall Strength Comments right grip strength 50#, left 50#                     [facility] Adult PT Treatment/Exercise - 08/16/16 0001      Elbow Exercises   Other elbow exercises Velcro board 4 x5     Shoulder Exercises: Standing   Other Standing Exercises ball bounce catching over and underhand    Other Standing Exercises Tricep press downs 20lb 3x10; Biceps curle 15lb 3x10     Shoulder Exercises: ROM/Strengthening   UBE (Upper Arm Bike) L3.5 48fd/3rev    Other ROM/Strengthening Exercises lat pull 25lb  3 sets 10   Other ROM/Strengthening Exercises seated row 25lb 2 sets 15; chest press 15lb 2x15      Hand Exercises   Other Hand Exercises Finger flex and ext 2x15;    Other Hand Exercises finger web, flex and ext; egg squeezes orange     Modalities   Modalities Paraffin;Moist Heat;Electrical Stimulation     Electrical Stimulation   Electrical Stimulation Location right elbow area   Electrical Stimulation Action IFC   Electrical Stimulation Parameters sitting    Electrical Stimulation Goals Pain     RUE Paraffin   Number Minutes Paraffin 15 Minutes   RUE Paraffin Location Hand                  PT Short Term Goals - 06/23/16 0940      PT SHORT TERM GOAL #1   Title independent with intiial HEP   Status Achieved           PT Long Term Goals - 08/16/16 0936      PT LONG TERM GOAL #1   Title report pain and tingling decreased 50%   Status Partially Met  PT LONG TERM GOAL #2   Title report able to do her hair, she currently reports that she has to have someone do it for her   Status Partially Met     PT LONG TERM GOAL #3   Title increase right grip strength to 45#   Status Achieved     PT LONG TERM GOAL #4   Title be able to extend all fingers to neutral   Status On-going               Plan - 08/16/16 0932    Clinical Impression Statement Pt tolerated all interventions well. Pt appears to have met a therapeutic plateau, She reports that ADL are easier to do but overall no improvement with function. Advises pt to return to MD   Rehab Potential Good   PT Frequency 2x / week   PT Duration 8 weeks   PT Treatment/Interventions ADLs/Self Care Home Management;Electrical Stimulation;Moist Heat;Therapeutic exercise;Therapeutic activities;Parrafin;Ultrasound;Neuromuscular re-education;Patient/family education;Manual techniques   PT Next Visit Plan D/C      Patient will benefit from skilled  therapeutic intervention in order to improve the following deficits and impairments:  Decreased coordination, Decreased range of motion, Decreased strength, Increased fascial restricitons, Increased muscle spasms, Impaired flexibility, Pain, Impaired UE functional use  Visit Diagnosis: Injury of right ulnar nerve, subsequent encounter  Stiffness of right elbow, not elsewhere classified  Pain in right wrist  Pain in right elbow  Joint pain in fingers of right hand  Stiffness of multiple joints     Problem List Patient Active Problem List   Diagnosis Date Noted  . Skin papules, generalized 08/04/2016  . Skin lesion of right lower limb 06/19/2016  . Leg swelling 05/12/2016  . Subconjunctival hemorrhage of right eye 04/27/2016  . Diarrhea 01/09/2016  . Health care maintenance 06/28/2015  . Anemia 12/21/2014  . Palpitations 01/07/2014  . Recurrent yeast vaginitis 11/05/2013  . Recurrent genital herpes 07/17/2013  . Hot flashes not due to menopause 11/27/2012  . Cubital tunnel syndrome on right 10/31/2012  . Type 2 diabetes, controlled, with neuropathy ([place]) 02/14/2007  . HYPERLIPIDEMIA 02/14/2007  . OBESITY, NOS 02/14/2007  . HYPERTENSION, BENIGN SYSTEMIC 02/14/2007  . ARTHRITIS 02/14/2007  . Insomnia 02/14/2007   PHYSICAL THERAPY DISCHARGE SUMMARY  Visits from Start of Care: 11  Plan: Patient agrees to discharge.  Patient goals were partially met. Patient is being discharged due to lack of progress.  ?????        Scot Jun, PTA  08/16/2016, 9:37 AM  [address] [place] [place], [place], [address] Phone: [phone]   Fax:  [phone]  Name: Veronica Simmons MRN: [id number] Date of Birth: 11/25/1951

## 2016-08-18 ENCOUNTER — Ambulatory Visit: Payer: Medicare Other

## 2016-08-18 ENCOUNTER — Other Ambulatory Visit: Payer: Self-pay | Admitting: Student

## 2016-08-30 ENCOUNTER — Telehealth: Payer: Self-pay | Admitting: *Deleted

## 2016-08-30 NOTE — Telephone Encounter (Signed)
Pt needs a letter from the MD stating that the mold and mildew in her house is affecting her health.  She states that it is so bad she does not even like to be there.  She needs to letter so a community program will come in and get rid of the mold for her.  She would like a call back when it is ready for pickup. Charish Schroepfer, Salome Spotted, CMA

## 2016-09-01 ENCOUNTER — Telehealth: Payer: Self-pay | Admitting: Student

## 2016-09-01 NOTE — Progress Notes (Signed)
I have reviewed this visit and discussed with Lauren Ducatte, RN, BSN, and agree with her documentation.   Artrice Kraker A. Miamarie Moll MD, MS Family Medicine Resident PGY-3 Pager [phone]  

## 2016-09-01 NOTE — Telephone Encounter (Signed)
Patient called regarding request for letter for mold removal. Letter will be left at front desk

## 2016-09-05 ENCOUNTER — Telehealth: Payer: Self-pay | Admitting: Student

## 2016-09-05 NOTE — Telephone Encounter (Signed)
Patient called regarding request for vitamin b shot. She heard from on eof her friends it could help her fatigue. We discussed that she should come in to futher discuss her fatigue and test as needed before starting any therapy. Patient expressed her understanding and acceptance of this plan and will schedule the appointment as needed

## 2016-09-05 NOTE — Telephone Encounter (Signed)
Pt is calling and would like to know if she can get a vitamin B shot. If so let her know so that she can set up an appointment. jw

## 2016-09-07 DIAGNOSIS — G5621 Lesion of ulnar nerve, right upper limb: Secondary | ICD-10-CM | POA: Diagnosis not present

## 2016-09-13 NOTE — Telephone Encounter (Signed)
See next phone note. Eleesha Purkey, Salome Spotted, CMA

## 2016-09-19 ENCOUNTER — Ambulatory Visit: Payer: Medicare Other | Admitting: Internal Medicine

## 2016-09-19 ENCOUNTER — Emergency Department (HOSPITAL_COMMUNITY)
Admission: EM | Admit: 2016-09-19 | Discharge: 2016-09-20 | Disposition: A | Discharge status: Home / Self Care | Payer: Medicare Other | Attending: Emergency Medicine | Admitting: Emergency Medicine

## 2016-09-19 DIAGNOSIS — I10 Essential (primary) hypertension: Secondary | ICD-10-CM | POA: Insufficient documentation

## 2016-09-19 DIAGNOSIS — M545 Low back pain, unspecified: Secondary | ICD-10-CM

## 2016-09-19 DIAGNOSIS — Z79899 Other long term (current) drug therapy: Secondary | ICD-10-CM | POA: Insufficient documentation

## 2016-09-19 DIAGNOSIS — Z7982 Long term (current) use of aspirin: Secondary | ICD-10-CM | POA: Diagnosis not present

## 2016-09-19 DIAGNOSIS — R109 Unspecified abdominal pain: Secondary | ICD-10-CM | POA: Diagnosis present

## 2016-09-19 DIAGNOSIS — Z7984 Long term (current) use of oral hypoglycemic drugs: Secondary | ICD-10-CM | POA: Diagnosis not present

## 2016-09-19 DIAGNOSIS — R739 Hyperglycemia, unspecified: Secondary | ICD-10-CM

## 2016-09-19 DIAGNOSIS — R197 Diarrhea, unspecified: Secondary | ICD-10-CM | POA: Diagnosis not present

## 2016-09-19 DIAGNOSIS — E1165 Type 2 diabetes mellitus with hyperglycemia: Secondary | ICD-10-CM | POA: Insufficient documentation

## 2016-09-19 NOTE — ED Triage Notes (Signed)
Pt states that she has had L sided lower back and hip pain and diarrhea x 2.5 weeks. Alert and oriented.

## 2016-09-19 NOTE — ED Triage Notes (Addendum)
Previous note writen in error

## 2016-09-19 NOTE — ED Notes (Signed)
NP at bedside.

## 2016-09-19 NOTE — ED Provider Notes (Signed)
[facility] DEPT Provider Note   CSN: [id number] Arrival date & time: 09/19/16  2051  By signing my name below, I, Gwenlyn Fudge, attest that this documentation has been prepared under the direction and in the presence of Junius Creamer, NP. Electronically Signed: Gwenlyn Fudge, ED Scribe. 09/20/16. 12:12 AM.  History   Chief Complaint Chief Complaint  Patient presents with  . Diarrhea  . Back Pain   The history is provided by the patient. No language interpreter was used.    HPI Comments: Veronica Simmons is a 65 y.o. female with PMHx of DM, HTN, HLD, and Anemia who presents to the Emergency Department complaining of gradual onset, intermittent  left flank pain onset 2.5 weeks. Sometimes the pain radiates to her abdomen. She states pain is similar in nature to labor pains. She has been using [facility] with temporary relief to pain. Pt has not taken any medication for pain relief. Pt denies hx of back injury. Pt reports associated intermittent diarrhea and nausea. She takes Janumet 500 mg 2x a day for DM. Denies hematuria, vomiting, abnormal vaginal discharge. Pt states she had an appointment scheduled today, but could not make the appointment.   Past Medical History:  Diagnosis Date  . ALLERGIC RHINITIS 02/19/2007   Qualifier: Diagnosis of  By: Lorne Skeens MD, VALENICA    . Anemia 1970s   on iron   . Arthritis   . Cubital tunnel syndrome on right 10/31/2012   Secondary to old fracture. Evaluated by ortho. [facility] offered surgery but patient refused. Numbness and burning pain 4th and 5th digit.    . Diabetes mellitus 2000   Never on insulin   . Hypertension 2000    Patient Active Problem List   Diagnosis Date Noted  . Skin papules, generalized 08/04/2016  . Skin lesion of right lower limb 06/19/2016  . Leg swelling 05/12/2016  . Subconjunctival hemorrhage of right eye 04/27/2016  . Diarrhea 01/09/2016  . Health care maintenance 06/28/2015  . Anemia 12/21/2014  .  Palpitations 01/07/2014  . Recurrent yeast vaginitis 11/05/2013  . Recurrent genital herpes 07/17/2013  . Hot flashes not due to menopause 11/27/2012  . Cubital tunnel syndrome on right 10/31/2012  . Type 2 diabetes, controlled, with neuropathy ([place]) 02/14/2007  . HYPERLIPIDEMIA 02/14/2007  . OBESITY, NOS 02/14/2007  . HYPERTENSION, BENIGN SYSTEMIC 02/14/2007  . ARTHRITIS 02/14/2007  . Insomnia 02/14/2007   No past surgical history on file.  OB History    No data available      Home Medications    Prior to Admission medications   Medication Sig Start Date End Date Taking? Authorizing Provider  amLODipine (NORVASC) 10 MG tablet Take 1 tablet (10 mg total) by mouth daily. 07/28/16   Veatrice Bourbon, MD  aspirin EC 81 MG tablet Take 1 tablet (81 mg total) by mouth daily. Patient not taking: Reported on 07/20/2016 04/25/16 04/25/17  Veatrice Bourbon, MD  bacitracin 500 UNIT/GM ointment Apply 1 application topically 2 (two) times daily. 06/19/16   Elberta Leatherwood, MD  cyclobenzaprine (FLEXERIL) 10 MG tablet Take 1 tablet (10 mg total) by mouth 2 (two) times daily as needed for muscle spasms. 09/20/16   Junius Creamer, NP  diclofenac (VOLTAREN) 50 MG EC tablet Take 1 tablet (50 mg total) by mouth 2 (two) times daily. 09/20/16   Junius Creamer, NP  gabapentin (NEURONTIN) 100 MG capsule Take 1 capsule (100 mg total) by mouth 3 (three) times daily. 10/02/14   Hattie Perch  [place], MD  glucose blood (ACCU-CHEK AVIVA PLUS) test strip Use as instructed 01/06/16   Alyssa A Haney, MD  glucose blood test strip Use as instructed 12/29/15   Alyssa A Haney, MD  indapamide (LOZOL) 2.5 MG tablet TAKE 1 TABLET BY MOUTH DAILY 08/07/16   Alyssa A Haney, MD  JANUMET 50-1000 MG tablet TAKE 1 TABLET BY MOUTH TWICE DAILY WITH MEALS Patient taking differently: Taking 1 tab in PM only 07/14/16   Alyssa A Haney, MD  Lancets (ONETOUCH ULTRASOFT) lancets Use as instructed 08/27/15   Alyssa A Haney, MD  lidocaine (LIDODERM) 5 % Place 1 patch onto  the skin daily. Remove & Discard patch within 12 hours or as directed by MD 09/20/16   Junius Creamer, NP  lisinopril (PRINIVIL,ZESTRIL) 20 MG tablet Take 1 tablet (20 mg total) by mouth daily. 04/25/16 04/25/17  Veatrice Bourbon, MD  loperamide (IMODIUM A-D) 2 MG tablet Take 1 tablet (2 mg total) by mouth 4 (four) times daily as needed for diarrhea or loose stools. 01/06/16   Veatrice Bourbon, MD  Multiple Vitamin (MULTIVITAMIN WITH MINERALS) TABS tablet Take 1 tablet by mouth daily.    Historical Provider, MD  omega-3 acid ethyl esters (LOVAZA) 1 g capsule Take 1 g by mouth daily.    Historical Provider, MD  potassium chloride SA (K-DUR,KLOR-CON) 20 MEQ tablet Take 20 mEq by mouth daily.    Historical Provider, MD  sitaGLIPtin-metformin (JANUMET) 50-500 MG tablet Take 1 tablet by mouth 2 (two) times daily with a meal. Patient taking differently: Take 1 tablet by mouth 2 (two) times daily with a meal.  04/05/16   Alyssa A Haney, MD  traZODone (DESYREL) 100 MG tablet TAKE 1/2 TO 1 TABLET BY MOUTH EVERY NIGHT AT BEDTIME AS NEEDED FOR SLEEP 06/15/16   Veatrice Bourbon, MD    Family History Family History  Problem Relation Age of Onset  . Early death Mother 107    shot   . Heart disease Father 57  . Diabetes Daughter   . Kidney disease Daughter     on dialysis   . Diabetes Maternal Aunt   . Cancer Paternal Uncle     Breast Cancer     Social History Social History  Substance Use Topics  . Smoking status: Never Smoker  . Smokeless tobacco: Never Used  . Alcohol use No    Allergies   Levofloxacin   Review of Systems Review of Systems  Constitutional: Negative for chills and fever.  Respiratory: Negative for cough and shortness of breath.   Cardiovascular: Negative for chest pain.  Gastrointestinal: Positive for abdominal pain and diarrhea. Negative for vomiting.  Genitourinary: Positive for flank pain. Negative for dysuria and vaginal discharge.  Musculoskeletal: Positive for arthralgias and back  pain.  Neurological: Negative for weakness.  All other systems reviewed and are negative.  Physical Exam Updated Vital Signs BP 120/65   Pulse 78   Temp 98.3 F (36.8 C) (Oral)   Resp 14   Ht 5' 5.5" (1.664 m)   Wt 100.2 kg   SpO2 99%   BMI 36.22 kg/m   Physical Exam  Constitutional: She appears well-developed and well-nourished. She is active. No distress.  HENT:  Head: Normocephalic and atraumatic.  Eyes: Conjunctivae are normal.  Neck: Normal range of motion. Neck supple.  Cardiovascular: Normal rate.   Pulmonary/Chest: Effort normal. No respiratory distress. She exhibits no tenderness.  Abdominal: Soft. She exhibits no mass. There is tenderness. There is  no guarding.    Neurological: She is alert.  Skin: Skin is warm and dry.  Psychiatric: She has a normal mood and affect. Her behavior is normal.  Nursing note and vitals reviewed.  ED Treatments / Results  DIAGNOSTIC STUDIES: Oxygen Saturation is 97% on RA, adequate by my interpretation.    COORDINATION OF CARE: 11:53 PM Discussed treatment plan with pt at bedside which includes Urinalysis and pt agreed to plan.  Labs (all labs ordered are listed, but only abnormal results are displayed) Labs Reviewed  COMPREHENSIVE METABOLIC PANEL - Abnormal; Notable for the following:       Result Value   Sodium 134 (*)    Chloride 100 (*)    Glucose, Bld 419 (*)    BUN 23 (*)    Total Bilirubin 0.2 (*)    All other components within normal limits  CBC - Abnormal; Notable for the following:    RBC 3.82 (*)    Hemoglobin 10.8 (*)    HCT 33.0 (*)    All other components within normal limits  URINALYSIS, ROUTINE W REFLEX MICROSCOPIC (NOT AT [facility]) - Abnormal; Notable for the following:    Specific Gravity, Urine 1.031 (*)    Glucose, UA >1000 (*)    Protein, ur 30 (*)    All other components within normal limits  URINE MICROSCOPIC-ADD ON - Abnormal; Notable for the following:    Squamous Epithelial / LPF 0-5 (*)     Bacteria, UA RARE (*)    All other components within normal limits  LIPASE, BLOOD   EKG  EKG Interpretation None      Radiology Ct Abdomen Pelvis W Contrast  Result Date: 09/20/2016 CLINICAL DATA:  Intermittent left flank pain, diarrhea, and nausea for 2.5 weeks. EXAM: CT ABDOMEN AND PELVIS WITH CONTRAST TECHNIQUE: Multidetector CT imaging of the abdomen and pelvis was performed using the standard protocol following bolus administration of intravenous contrast. CONTRAST:  163mL ISOVUE-300 IOPAMIDOL (ISOVUE-300) INJECTION 61% COMPARISON:  05/27/2012 FINDINGS: Lower chest: Mild dependent changes in the lung bases. Hepatobiliary: No focal liver abnormality is seen. No gallstones, gallbladder wall thickening, or biliary dilatation. Pancreas: Unremarkable. No pancreatic ductal dilatation or surrounding inflammatory changes. Spleen: Normal in size without focal abnormality. Adrenals/Urinary Tract: Adrenal glands are unremarkable. Kidneys are normal, without renal calculi, focal lesion, or hydronephrosis. Bladder is unremarkable. Stomach/Bowel: Stomach is within normal limits. Appendix appears normal. No evidence of bowel wall thickening, distention, or inflammatory changes. Vascular/Lymphatic: No significant vascular findings are present. No enlarged abdominal or pelvic lymph nodes. Reproductive: Uterus and bilateral adnexa are unremarkable. Other: No abdominal wall hernia or abnormality. No abdominopelvic ascites. Musculoskeletal: Degenerative changes in the spine. No destructive bone lesions. IMPRESSION: No acute process demonstrated in the abdomen or pelvis. No bowel obstruction or inflammation is suggested. Electronically Signed   By: Lucienne Capers M.D.   On: 09/20/2016 03:06    Procedures Procedures (including critical care time)  Medications Ordered in ED Medications  cyclobenzaprine (FLEXERIL) tablet 5 mg (not administered)  diclofenac (VOLTAREN) EC tablet 50 mg (not administered)    lidocaine (LIDODERM) 5 % 1 patch (not administered)  0.9 %  sodium chloride infusion ( Intravenous New Bag/Given 09/20/16 0105)  HYDROmorphone (DILAUDID) injection 1 mg (1 mg Intravenous Given 09/20/16 0105)  ondansetron (ZOFRAN) injection 4 mg (4 mg Intravenous Given 09/20/16 0105)  iopamidol (ISOVUE-300) 61 % injection 100 mL (100 mLs Intravenous Contrast Given 09/20/16 0151)     Initial Impression / Assessment and Plan /  ED Course  I have reviewed the triage vital signs and the nursing notes.  Pertinent labs & imaging results that were available during my care of the patient were reviewed by me and considered in my medical decision making (see chart for details).  Clinical Course   I personally performed the services described in this documentation, which was scribed in my presence. The recorded information has been reviewed and is accurate. Today patient had labs, urine, and CT scan due to her symptoms, all within normal parameters.  Discharge home with prescription for lidocaine patch and Flexeril as I feel this is musculoskeletal in nature.  She is to follow-up with her PCP  Final Clinical Impressions(s) / ED Diagnoses   Final diagnoses:  Acute left-sided low back pain without sciatica  Hyperglycemia    New Prescriptions New Prescriptions   CYCLOBENZAPRINE (FLEXERIL) 10 MG TABLET    Take 1 tablet (10 mg total) by mouth 2 (two) times daily as needed for muscle spasms.   DICLOFENAC (VOLTAREN) 50 MG EC TABLET    Take 1 tablet (50 mg total) by mouth 2 (two) times daily.   LIDOCAINE (LIDODERM) 5 %    Place 1 patch onto the skin daily. Remove & Discard patch within 12 hours or as directed by MD     Junius Creamer, NP 09/20/16 [place], MD 09/20/16 1946

## 2016-09-20 ENCOUNTER — Encounter (HOSPITAL_COMMUNITY): Payer: Self-pay

## 2016-09-20 ENCOUNTER — Emergency Department (HOSPITAL_COMMUNITY): Payer: Medicare Other

## 2016-09-20 DIAGNOSIS — E1165 Type 2 diabetes mellitus with hyperglycemia: Secondary | ICD-10-CM | POA: Diagnosis not present

## 2016-09-20 DIAGNOSIS — R109 Unspecified abdominal pain: Secondary | ICD-10-CM | POA: Diagnosis not present

## 2016-09-20 LAB — COMPREHENSIVE METABOLIC PANEL
ALK PHOS: 43 U/L (ref 38–126)
ALT: 18 U/L (ref 14–54)
AST: 21 U/L (ref 15–41)
Albumin: 3.9 g/dL (ref 3.5–5.0)
Anion gap: 6 (ref 5–15)
BILIRUBIN TOTAL: 0.2 mg/dL — AB (ref 0.3–1.2)
BUN: 23 mg/dL — ABNORMAL HIGH (ref 6–20)
CALCIUM: 9.7 mg/dL (ref 8.9–10.3)
CO2: 28 mmol/L (ref 22–32)
CREATININE: 0.88 mg/dL (ref 0.44–1.00)
Chloride: 100 mmol/L — ABNORMAL LOW (ref 101–111)
Glucose, Bld: 419 mg/dL — ABNORMAL HIGH (ref 65–99)
Potassium: 4.1 mmol/L (ref 3.5–5.1)
Sodium: 134 mmol/L — ABNORMAL LOW (ref 135–145)
TOTAL PROTEIN: 7.6 g/dL (ref 6.5–8.1)

## 2016-09-20 LAB — CBC
HCT: 33 % — ABNORMAL LOW (ref 36.0–46.0)
Hemoglobin: 10.8 g/dL — ABNORMAL LOW (ref 12.0–15.0)
MCH: 28.3 pg (ref 26.0–34.0)
MCHC: 32.7 g/dL (ref 30.0–36.0)
MCV: 86.4 fL (ref 78.0–100.0)
PLATELETS: 312 10*3/uL (ref 150–400)
RBC: 3.82 MIL/uL — AB (ref 3.87–5.11)
RDW: 13.5 % (ref 11.5–15.5)
WBC: 6 10*3/uL (ref 4.0–10.5)

## 2016-09-20 LAB — URINALYSIS, ROUTINE W REFLEX MICROSCOPIC
Bilirubin Urine: NEGATIVE
Glucose, UA: >1000 mg/dL — AB
HGB URINE DIPSTICK: NEGATIVE
Ketones, ur: NEGATIVE mg/dL
Leukocytes, UA: NEGATIVE
Nitrite: NEGATIVE
PROTEIN: 30 mg/dL — AB
Specific Gravity, Urine: 1.031 — ABNORMAL HIGH (ref 1.005–1.030)
pH: 5 (ref 5.0–8.0)

## 2016-09-20 LAB — LIPASE, BLOOD: Lipase: 29 U/L (ref 11–51)

## 2016-09-20 LAB — URINE MICROSCOPIC-ADD ON: RBC / HPF: NONE SEEN RBC/hpf (ref 0–5)

## 2016-09-20 MED ORDER — LIDOCAINE 5 % EX PTCH
1.0000 | MEDICATED_PATCH | CUTANEOUS | Status: DC
  Administered 2016-09-20: 1 via TRANSDERMAL
  Filled 2016-09-20: qty 1

## 2016-09-20 MED ORDER — LIDOCAINE 5 % EX PTCH: 1.0000 | MEDICATED_PATCH | CUTANEOUS | 0 refills | Status: DC

## 2016-09-20 MED ORDER — SODIUM CHLORIDE 0.9 % IV SOLN
Freq: Once | INTRAVENOUS | Status: AC
  Administered 2016-09-20: 01:00:00 via INTRAVENOUS

## 2016-09-20 MED ORDER — CYCLOBENZAPRINE HCL 10 MG PO TABS: 10.0000 mg | ORAL_TABLET | Freq: Two times a day (BID) | ORAL | 0 refills | Status: DC | PRN

## 2016-09-20 MED ORDER — DICLOFENAC SODIUM 50 MG PO TBEC
50.0000 mg | DELAYED_RELEASE_TABLET | Freq: Three times a day (TID) | ORAL | Status: DC
  Administered 2016-09-20: 50 mg via ORAL
  Filled 2016-09-20: qty 1

## 2016-09-20 MED ORDER — IOPAMIDOL (ISOVUE-300) INJECTION 61%
100.0000 mL | Freq: Once | INTRAVENOUS | Status: AC | PRN
  Administered 2016-09-20: 100 mL via INTRAVENOUS

## 2016-09-20 MED ORDER — HYDROMORPHONE HCL 1 MG/ML IJ SOLN
1.0000 mg | Freq: Once | INTRAMUSCULAR | Status: AC
  Administered 2016-09-20: 1 mg via INTRAVENOUS
  Filled 2016-09-20: qty 1

## 2016-09-20 MED ORDER — ONDANSETRON HCL 4 MG/2ML IJ SOLN
4.0000 mg | Freq: Once | INTRAMUSCULAR | Status: AC
  Administered 2016-09-20: 4 mg via INTRAVENOUS
  Filled 2016-09-20: qty 2

## 2016-09-20 MED ORDER — CYCLOBENZAPRINE HCL 10 MG PO TABS
5.0000 mg | ORAL_TABLET | Freq: Once | ORAL | Status: AC
  Administered 2016-09-20: 5 mg via ORAL
  Filled 2016-09-20: qty 1

## 2016-09-20 MED ORDER — DICLOFENAC SODIUM 50 MG PO TBEC: 50.0000 mg | DELAYED_RELEASE_TABLET | Freq: Two times a day (BID) | ORAL | 0 refills | Status: DC

## 2016-09-20 NOTE — ED Notes (Signed)
NP at bedside.

## 2016-09-20 NOTE — Discharge Instructions (Signed)
Take the medication as directed make an appointment with your PCP for follow up

## 2016-09-27 ENCOUNTER — Ambulatory Visit: Payer: Medicare Other | Admitting: Family Medicine

## 2016-10-10 ENCOUNTER — Other Ambulatory Visit: Payer: Self-pay | Admitting: Student

## 2016-10-19 ENCOUNTER — Telehealth: Payer: Self-pay | Admitting: Student

## 2016-10-19 NOTE — Telephone Encounter (Signed)
Pt is calling and would like a referral to see a neurologist at [facility]  in [place]. jw

## 2016-10-23 ENCOUNTER — Other Ambulatory Visit: Payer: Self-pay | Admitting: Student

## 2016-10-23 DIAGNOSIS — G5621 Lesion of ulnar nerve, right upper limb: Secondary | ICD-10-CM

## 2016-10-23 MED ORDER — GABAPENTIN 100 MG PO CAPS: 100.0000 mg | ORAL_CAPSULE | Freq: Three times a day (TID) | ORAL | 3 refills | Status: DC

## 2016-10-23 NOTE — Telephone Encounter (Signed)
Pt needs a refill on gabapentin and pt also needs a refill on a medication for swelling. Pt does not know what it is called. Pt uses [facility] on [address]. Please advise. Thanks! ep

## 2016-10-23 NOTE — Telephone Encounter (Signed)
Spoke with patient and she voiced understanding on why she needed to come in before a referral for her neuropathy can be placed.  She will be seeing PCP on 10/25/16 but needs a refill on her gabapentin before then. Jazmin Hartsell,CMA

## 2016-10-23 NOTE — Telephone Encounter (Signed)
Patient will need to make an appointment for evaluation before referral will be made

## 2016-10-25 ENCOUNTER — Ambulatory Visit (INDEPENDENT_AMBULATORY_CARE_PROVIDER_SITE_OTHER): Payer: Medicare Other | Admitting: Student

## 2016-10-25 ENCOUNTER — Encounter: Payer: Self-pay | Admitting: Student

## 2016-10-25 VITALS — BP 152/66 | HR 97 | Temp 98.5°F | Wt 218.0 lb

## 2016-10-25 DIAGNOSIS — G609 Hereditary and idiopathic neuropathy, unspecified: Secondary | ICD-10-CM

## 2016-10-25 DIAGNOSIS — E1140 Type 2 diabetes mellitus with diabetic neuropathy, unspecified: Secondary | ICD-10-CM | POA: Diagnosis not present

## 2016-10-25 DIAGNOSIS — G5621 Lesion of ulnar nerve, right upper limb: Secondary | ICD-10-CM | POA: Diagnosis not present

## 2016-10-25 DIAGNOSIS — I10 Essential (primary) hypertension: Secondary | ICD-10-CM

## 2016-10-25 LAB — POCT GLYCOSYLATED HEMOGLOBIN (HGB A1C): Hemoglobin A1C: 7.8

## 2016-10-25 MED ORDER — GABAPENTIN 100 MG PO CAPS: 200.0000 mg | ORAL_CAPSULE | Freq: Three times a day (TID) | ORAL | 3 refills | Status: DC

## 2016-10-25 NOTE — Patient Instructions (Addendum)
Follow up as needed Your A1c was 7.8 Please eat healthfully and get at least 30 mins of exercise daily If you have questions or concerns, call the office at [phone]

## 2016-10-25 NOTE — Progress Notes (Signed)
   Subjective:    Patient ID: Veronica Simmons, female    DOB: 10/13/1951, 65 y.o.   MRN: [id number]   CC: referral to neurology for neuropathy  HPI: 65 y/o F with PMH of T2DM presents for request for referral to neurology for diabetic neuropathy  Diabetic neuropathy - has been presribed gabapentin 100 TID but has not been taking it because she ran out and had not pickeddit up - she heard of a program at [facility] neurology for diabetic neuropathy and would like to try it - she denies any trauma to her feet and wears protective foot wear at all times - A1c 7.8 today  HTN - reports compliance with medication regimen - denies headache, chest pain, SOB  Smoking status reviewed  Review of Systems  Per HPI, else denies recent illness, fever, abdominal pain, weakness    Objective:  BP (!) 152/66   Pulse 97   Temp 98.5 F (36.9 C) (Oral)   Wt 218 lb (98.9 kg)   SpO2 92%   BMI 35.73 kg/m  Vitals and nursing note reviewed  General: NAD Cardiac: RRR Respiratory: CTAB, normal effort Extremities: no edema or cyanosis Skin: warm and dry, no rashes noted Neuro: alert and oriented   Assessment & Plan:    Type 2 diabetes, controlled, with neuropathy (HCC) Increased Gabapentin to 200 TID and will titrate up as needed. Referral to neurology made as requested - discussed diet and exersie  HYPERTENSION, BENIGN SYSTEMIC Systolic BP above goal- no red flag symptoms - will address as next visit    Alyssa A. Lincoln [name] MD, [facility] Resident PGY-3 Pager [phone]

## 2016-10-26 NOTE — Assessment & Plan Note (Signed)
Systolic BP above goal- no red flag symptoms - will address as next visit

## 2016-10-26 NOTE — Assessment & Plan Note (Signed)
Increased Gabapentin to 200 TID and will titrate up as needed. Referral to neurology made as requested - discussed diet and exersie

## 2016-11-22 ENCOUNTER — Ambulatory Visit: Payer: Medicare Other | Admitting: Student

## 2016-12-27 DIAGNOSIS — R252 Cramp and spasm: Secondary | ICD-10-CM | POA: Diagnosis not present

## 2016-12-27 DIAGNOSIS — G629 Polyneuropathy, unspecified: Secondary | ICD-10-CM | POA: Diagnosis not present

## 2016-12-28 DIAGNOSIS — G629 Polyneuropathy, unspecified: Secondary | ICD-10-CM | POA: Diagnosis not present

## 2016-12-28 DIAGNOSIS — I10 Essential (primary) hypertension: Secondary | ICD-10-CM | POA: Diagnosis not present

## 2016-12-29 ENCOUNTER — Other Ambulatory Visit: Payer: Self-pay | Admitting: Student

## 2017-01-08 DIAGNOSIS — M19041 Primary osteoarthritis, right hand: Secondary | ICD-10-CM | POA: Diagnosis not present

## 2017-01-23 DIAGNOSIS — Z1211 Encounter for screening for malignant neoplasm of colon: Secondary | ICD-10-CM | POA: Diagnosis not present

## 2017-01-23 DIAGNOSIS — G4700 Insomnia, unspecified: Secondary | ICD-10-CM | POA: Diagnosis not present

## 2017-01-23 DIAGNOSIS — E1140 Type 2 diabetes mellitus with diabetic neuropathy, unspecified: Secondary | ICD-10-CM | POA: Diagnosis not present

## 2017-01-23 DIAGNOSIS — I10 Essential (primary) hypertension: Secondary | ICD-10-CM | POA: Diagnosis not present

## 2017-01-23 DIAGNOSIS — Z1231 Encounter for screening mammogram for malignant neoplasm of breast: Secondary | ICD-10-CM | POA: Diagnosis not present

## 2017-01-26 ENCOUNTER — Other Ambulatory Visit: Payer: Self-pay | Admitting: *Deleted

## 2017-01-26 DIAGNOSIS — E2839 Other primary ovarian failure: Secondary | ICD-10-CM

## 2017-02-09 ENCOUNTER — Ambulatory Visit
Admission: RE | Admit: 2017-02-09 | Discharge: 2017-02-09 | Disposition: A | Discharge status: Home / Self Care | Payer: Medicare Other | Source: Ambulatory Visit | Attending: Family Medicine | Admitting: Family Medicine

## 2017-02-09 ENCOUNTER — Ambulatory Visit
Admission: RE | Admit: 2017-02-09 | Discharge: 2017-02-09 | Disposition: A | Discharge status: Home / Self Care | Payer: Medicare Other | Source: Ambulatory Visit | Attending: *Deleted | Admitting: *Deleted

## 2017-02-09 DIAGNOSIS — Z1231 Encounter for screening mammogram for malignant neoplasm of breast: Secondary | ICD-10-CM

## 2017-02-09 DIAGNOSIS — E2839 Other primary ovarian failure: Secondary | ICD-10-CM

## 2017-02-09 DIAGNOSIS — Z780 Asymptomatic menopausal state: Secondary | ICD-10-CM | POA: Diagnosis not present

## 2017-02-09 DIAGNOSIS — M8589 Other specified disorders of bone density and structure, multiple sites: Secondary | ICD-10-CM | POA: Diagnosis not present

## 2017-02-22 DIAGNOSIS — I10 Essential (primary) hypertension: Secondary | ICD-10-CM | POA: Diagnosis not present

## 2017-02-22 DIAGNOSIS — G5621 Lesion of ulnar nerve, right upper limb: Secondary | ICD-10-CM | POA: Diagnosis not present

## 2017-02-22 DIAGNOSIS — E1140 Type 2 diabetes mellitus with diabetic neuropathy, unspecified: Secondary | ICD-10-CM | POA: Diagnosis not present

## 2017-03-16 DIAGNOSIS — E1140 Type 2 diabetes mellitus with diabetic neuropathy, unspecified: Secondary | ICD-10-CM | POA: Diagnosis not present

## 2017-03-16 DIAGNOSIS — Z1389 Encounter for screening for other disorder: Secondary | ICD-10-CM | POA: Diagnosis not present

## 2017-03-16 DIAGNOSIS — E785 Hyperlipidemia, unspecified: Secondary | ICD-10-CM | POA: Diagnosis not present

## 2017-03-16 DIAGNOSIS — E113299 Type 2 diabetes mellitus with mild nonproliferative diabetic retinopathy without macular edema, unspecified eye: Secondary | ICD-10-CM | POA: Diagnosis not present

## 2017-03-16 DIAGNOSIS — I10 Essential (primary) hypertension: Secondary | ICD-10-CM | POA: Diagnosis not present

## 2017-06-04 DIAGNOSIS — G609 Hereditary and idiopathic neuropathy, unspecified: Secondary | ICD-10-CM | POA: Diagnosis not present

## 2017-06-04 DIAGNOSIS — M792 Neuralgia and neuritis, unspecified: Secondary | ICD-10-CM | POA: Diagnosis not present

## 2017-06-04 DIAGNOSIS — M7989 Other specified soft tissue disorders: Secondary | ICD-10-CM | POA: Diagnosis not present

## 2017-06-21 DIAGNOSIS — L858 Other specified epidermal thickening: Secondary | ICD-10-CM | POA: Diagnosis not present

## 2017-06-21 DIAGNOSIS — D492 Neoplasm of unspecified behavior of bone, soft tissue, and skin: Secondary | ICD-10-CM | POA: Diagnosis not present

## 2017-06-21 DIAGNOSIS — L308 Other specified dermatitis: Secondary | ICD-10-CM | POA: Diagnosis not present

## 2017-06-21 DIAGNOSIS — L989 Disorder of the skin and subcutaneous tissue, unspecified: Secondary | ICD-10-CM | POA: Diagnosis not present

## 2017-07-17 DIAGNOSIS — M792 Neuralgia and neuritis, unspecified: Secondary | ICD-10-CM | POA: Diagnosis not present

## 2017-07-17 DIAGNOSIS — D492 Neoplasm of unspecified behavior of bone, soft tissue, and skin: Secondary | ICD-10-CM | POA: Diagnosis not present

## 2017-07-25 DIAGNOSIS — H524 Presbyopia: Secondary | ICD-10-CM | POA: Diagnosis not present

## 2017-07-25 DIAGNOSIS — H5203 Hypermetropia, bilateral: Secondary | ICD-10-CM | POA: Diagnosis not present

## 2017-07-25 DIAGNOSIS — H52209 Unspecified astigmatism, unspecified eye: Secondary | ICD-10-CM | POA: Diagnosis not present

## 2018-01-01 ENCOUNTER — Other Ambulatory Visit: Payer: Self-pay | Admitting: *Deleted

## 2018-01-01 DIAGNOSIS — Z1231 Encounter for screening mammogram for malignant neoplasm of breast: Secondary | ICD-10-CM

## 2018-02-11 ENCOUNTER — Ambulatory Visit
Admission: RE | Admit: 2018-02-11 | Discharge: 2018-02-11 | Disposition: A | Discharge status: Home / Self Care | Payer: Medicare Other | Source: Ambulatory Visit | Attending: *Deleted | Admitting: *Deleted

## 2018-02-11 DIAGNOSIS — Z1231 Encounter for screening mammogram for malignant neoplasm of breast: Secondary | ICD-10-CM

## 2018-03-06 ENCOUNTER — Encounter (HOSPITAL_COMMUNITY): Payer: Self-pay | Admitting: Family Medicine

## 2018-03-06 DIAGNOSIS — G4700 Insomnia, unspecified: Secondary | ICD-10-CM | POA: Diagnosis not present

## 2018-03-06 DIAGNOSIS — Z8249 Family history of ischemic heart disease and other diseases of the circulatory system: Secondary | ICD-10-CM | POA: Insufficient documentation

## 2018-03-06 DIAGNOSIS — Z841 Family history of disorders of kidney and ureter: Secondary | ICD-10-CM | POA: Insufficient documentation

## 2018-03-06 DIAGNOSIS — I10 Essential (primary) hypertension: Secondary | ICD-10-CM | POA: Insufficient documentation

## 2018-03-06 DIAGNOSIS — Z833 Family history of diabetes mellitus: Secondary | ICD-10-CM | POA: Diagnosis not present

## 2018-03-06 DIAGNOSIS — Z803 Family history of malignant neoplasm of breast: Secondary | ICD-10-CM | POA: Insufficient documentation

## 2018-03-06 DIAGNOSIS — M1990 Unspecified osteoarthritis, unspecified site: Secondary | ICD-10-CM | POA: Insufficient documentation

## 2018-03-06 DIAGNOSIS — Z8673 Personal history of transient ischemic attack (TIA), and cerebral infarction without residual deficits: Secondary | ICD-10-CM | POA: Insufficient documentation

## 2018-03-06 DIAGNOSIS — R531 Weakness: Secondary | ICD-10-CM | POA: Diagnosis not present

## 2018-03-06 DIAGNOSIS — Z7984 Long term (current) use of oral hypoglycemic drugs: Secondary | ICD-10-CM | POA: Diagnosis not present

## 2018-03-06 DIAGNOSIS — G5600 Carpal tunnel syndrome, unspecified upper limb: Secondary | ICD-10-CM | POA: Insufficient documentation

## 2018-03-06 DIAGNOSIS — E1142 Type 2 diabetes mellitus with diabetic polyneuropathy: Secondary | ICD-10-CM | POA: Insufficient documentation

## 2018-03-06 DIAGNOSIS — G459 Transient cerebral ischemic attack, unspecified: Secondary | ICD-10-CM | POA: Diagnosis present

## 2018-03-06 DIAGNOSIS — E669 Obesity, unspecified: Secondary | ICD-10-CM | POA: Insufficient documentation

## 2018-03-06 DIAGNOSIS — E7800 Pure hypercholesterolemia, unspecified: Secondary | ICD-10-CM | POA: Diagnosis not present

## 2018-03-06 DIAGNOSIS — B373 Candidiasis of vulva and vagina: Secondary | ICD-10-CM | POA: Insufficient documentation

## 2018-03-06 DIAGNOSIS — E785 Hyperlipidemia, unspecified: Secondary | ICD-10-CM | POA: Insufficient documentation

## 2018-03-06 DIAGNOSIS — R202 Paresthesia of skin: Secondary | ICD-10-CM | POA: Diagnosis not present

## 2018-03-06 DIAGNOSIS — Z6836 Body mass index (BMI) 36.0-36.9, adult: Secondary | ICD-10-CM | POA: Diagnosis not present

## 2018-03-06 DIAGNOSIS — Z888 Allergy status to other drugs, medicaments and biological substances status: Secondary | ICD-10-CM | POA: Insufficient documentation

## 2018-03-06 DIAGNOSIS — J309 Allergic rhinitis, unspecified: Secondary | ICD-10-CM | POA: Diagnosis not present

## 2018-03-06 DIAGNOSIS — Z79899 Other long term (current) drug therapy: Secondary | ICD-10-CM | POA: Diagnosis not present

## 2018-03-06 LAB — CBC
HCT: 33.3 % — ABNORMAL LOW (ref 36.0–46.0)
HEMOGLOBIN: 10.6 g/dL — AB (ref 12.0–15.0)
MCH: 29 pg (ref 26.0–34.0)
MCHC: 31.8 g/dL (ref 30.0–36.0)
MCV: 91 fL (ref 78.0–100.0)
Platelets: 310 10*3/uL (ref 150–400)
RBC: 3.66 MIL/uL — AB (ref 3.87–5.11)
RDW: 13.2 % (ref 11.5–15.5)
WBC: 5.8 10*3/uL (ref 4.0–10.5)

## 2018-03-06 LAB — DIFFERENTIAL
Basophils Absolute: 0 10*3/uL (ref 0.0–0.1)
Basophils Relative: 0 %
EOS PCT: 4 %
Eosinophils Absolute: 0.2 10*3/uL (ref 0.0–0.7)
LYMPHS PCT: 45 %
Lymphs Abs: 2.7 10*3/uL (ref 0.7–4.0)
MONO ABS: 0.3 10*3/uL (ref 0.1–1.0)
Monocytes Relative: 5 %
Neutro Abs: 2.7 10*3/uL (ref 1.7–7.7)
Neutrophils Relative %: 46 %

## 2018-03-06 LAB — I-STAT CHEM 8, ED
BUN: 17 mg/dL (ref 6–20)
CALCIUM ION: 1.23 mmol/L (ref 1.15–1.40)
Chloride: 102 mmol/L (ref 101–111)
Creatinine, Ser: 0.9 mg/dL (ref 0.44–1.00)
GLUCOSE: 208 mg/dL — AB (ref 65–99)
HCT: 33 % — ABNORMAL LOW (ref 36.0–46.0)
Hemoglobin: 11.2 g/dL — ABNORMAL LOW (ref 12.0–15.0)
Potassium: 3.7 mmol/L (ref 3.5–5.1)
SODIUM: 140 mmol/L (ref 135–145)
TCO2: 24 mmol/L (ref 22–32)

## 2018-03-06 LAB — COMPREHENSIVE METABOLIC PANEL
ALK PHOS: 40 U/L (ref 38–126)
ALT: 14 U/L (ref 14–54)
ANION GAP: 9 (ref 5–15)
AST: 23 U/L (ref 15–41)
Albumin: 4 g/dL (ref 3.5–5.0)
BILIRUBIN TOTAL: 0.1 mg/dL — AB (ref 0.3–1.2)
BUN: 18 mg/dL (ref 6–20)
CALCIUM: 9.5 mg/dL (ref 8.9–10.3)
CO2: 27 mmol/L (ref 22–32)
Chloride: 105 mmol/L (ref 101–111)
Creatinine, Ser: 0.97 mg/dL (ref 0.44–1.00)
GFR calc Af Amer: >60 mL/min (ref >60)
GFR calc non Af Amer: 59 mL/min — ABNORMAL LOW (ref >60)
GLUCOSE: 209 mg/dL — AB (ref 65–99)
Potassium: 3.9 mmol/L (ref 3.5–5.1)
Sodium: 141 mmol/L (ref 135–145)
TOTAL PROTEIN: 7.5 g/dL (ref 6.5–8.1)

## 2018-03-06 LAB — PROTIME-INR
INR: 0.86
Prothrombin Time: 11.7 seconds (ref 11.4–15.2)

## 2018-03-06 LAB — APTT: aPTT: 31 seconds (ref 24–36)

## 2018-03-06 LAB — I-STAT TROPONIN, ED: Troponin i, poc: 0 ng/mL (ref 0.00–0.08)

## 2018-03-06 NOTE — ED Triage Notes (Signed)
Patient reports she is experiencing intermittent right side numbness, tingling, and weakness. Symptoms started 2 days ago. Patient is ambulatory to triage with a steady gait and able to move extremities with no difficulties.

## 2018-03-07 ENCOUNTER — Observation Stay (HOSPITAL_COMMUNITY): Payer: Medicare Other

## 2018-03-07 ENCOUNTER — Other Ambulatory Visit: Payer: Self-pay

## 2018-03-07 ENCOUNTER — Emergency Department (HOSPITAL_COMMUNITY): Payer: Medicare Other

## 2018-03-07 ENCOUNTER — Observation Stay (HOSPITAL_COMMUNITY)
Admission: EM | Admit: 2018-03-07 | Discharge: 2018-03-08 | Disposition: A | Discharge status: Home / Self Care | Payer: Medicare Other | Attending: Family Medicine | Admitting: Family Medicine

## 2018-03-07 DIAGNOSIS — E1140 Type 2 diabetes mellitus with diabetic neuropathy, unspecified: Secondary | ICD-10-CM | POA: Diagnosis present

## 2018-03-07 DIAGNOSIS — G459 Transient cerebral ischemic attack, unspecified: Secondary | ICD-10-CM

## 2018-03-07 DIAGNOSIS — R531 Weakness: Secondary | ICD-10-CM

## 2018-03-07 DIAGNOSIS — E669 Obesity, unspecified: Secondary | ICD-10-CM

## 2018-03-07 DIAGNOSIS — I10 Essential (primary) hypertension: Secondary | ICD-10-CM

## 2018-03-07 LAB — HEMOGLOBIN A1C
Hgb A1c MFr Bld: 5.9 % — ABNORMAL HIGH (ref 4.8–5.6)
MEAN PLASMA GLUCOSE: 122.63 mg/dL

## 2018-03-07 LAB — LIPID PANEL
CHOL/HDL RATIO: 4.1 ratio
Cholesterol: 207 mg/dL — ABNORMAL HIGH (ref 0–200)
HDL: 50 mg/dL (ref >40)
LDL Cholesterol: 122 mg/dL — ABNORMAL HIGH (ref 0–99)
Triglycerides: 176 mg/dL — ABNORMAL HIGH (ref <150)
VLDL: 35 mg/dL (ref 0–40)

## 2018-03-07 LAB — VITAMIN B12: Vitamin B-12: 1746 pg/mL — ABNORMAL HIGH (ref 180–914)

## 2018-03-07 LAB — TSH: TSH: 1.316 u[IU]/mL (ref 0.350–4.500)

## 2018-03-07 LAB — CBG MONITORING, ED: GLUCOSE-CAPILLARY: 115 mg/dL — AB (ref 65–99)

## 2018-03-07 MED ORDER — ACETAMINOPHEN 160 MG/5ML PO SOLN: 650.0000 mg | ORAL | Status: DC | PRN

## 2018-03-07 MED ORDER — STROKE: EARLY STAGES OF RECOVERY BOOK
Freq: Once | Status: AC
  Administered 2018-03-07: 10:00:00
  Filled 2018-03-07: qty 1

## 2018-03-07 MED ORDER — SODIUM CHLORIDE 0.9 % IV SOLN
INTRAVENOUS | Status: AC
  Administered 2018-03-07 – 2018-03-08 (×2): via INTRAVENOUS

## 2018-03-07 MED ORDER — ASPIRIN 325 MG PO TABS
325.0000 mg | ORAL_TABLET | Freq: Once | ORAL | Status: AC
  Administered 2018-03-07: 325 mg via ORAL
  Filled 2018-03-07: qty 1

## 2018-03-07 MED ORDER — ACETAMINOPHEN 325 MG PO TABS: 650.0000 mg | ORAL_TABLET | ORAL | Status: DC | PRN

## 2018-03-07 MED ORDER — SENNOSIDES-DOCUSATE SODIUM 8.6-50 MG PO TABS: 1.0000 | ORAL_TABLET | Freq: Every evening | ORAL | Status: DC | PRN

## 2018-03-07 MED ORDER — TRAZODONE HCL 100 MG PO TABS
100.0000 mg | ORAL_TABLET | Freq: Every evening | ORAL | Status: DC | PRN
Start: 2018-03-07 — End: 2018-03-08

## 2018-03-07 MED ORDER — ACETAMINOPHEN 650 MG RE SUPP: 650.0000 mg | RECTAL | Status: DC | PRN

## 2018-03-07 MED ORDER — METFORMIN HCL 500 MG PO TABS
1000.0000 mg | ORAL_TABLET | Freq: Two times a day (BID) | ORAL | Status: DC
Start: 2018-03-07 — End: 2018-03-08
  Administered 2018-03-07 – 2018-03-08 (×3): 1000 mg via ORAL
  Filled 2018-03-07 (×3): qty 2

## 2018-03-07 MED ORDER — SITAGLIPTIN PHOS-METFORMIN HCL 50-1000 MG PO TABS: 1.0000 | ORAL_TABLET | Freq: Two times a day (BID) | ORAL | Status: DC

## 2018-03-07 MED ORDER — LINAGLIPTIN 5 MG PO TABS
5.0000 mg | ORAL_TABLET | Freq: Two times a day (BID) | ORAL | Status: DC
  Administered 2018-03-07 – 2018-03-08 (×3): 5 mg via ORAL
  Filled 2018-03-07 (×3): qty 1

## 2018-03-07 NOTE — ED Notes (Signed)
ED TO INPATIENT HANDOFF REPORT  Name/Age/Gender Veronica Simmons 67 y.o. female  Code Status    Code Status Orders  (From admission, onward)        Start     Ordered   03/07/18 0816  Full code  Continuous     03/07/18 0817    Code Status History    This patient has a current code status but no historical code status.      Home/SNF/Other Home  Chief Complaint weakness on right side, nauesa  Level of Care/Admitting Diagnosis ED Disposition    ED Disposition Condition [place] [facility]: [facility] [[id number]]  Level of Care: Med-Surg [16]  Diagnosis: Right sided weakness [242353]  Admitting Physician: Gerlean Ren [facility] [[id number]]  Attending Physician: Gerlean Ren [facility] [[id number]]  PT Class (Do Not Modify): Observation [104]  PT Acc Code (Do Not Modify): Observation [10022]       Medical History Past Medical History:  Diagnosis Date  . ALLERGIC RHINITIS 02/19/2007   Qualifier: Diagnosis of  By: Lorne Skeens MD, VALENICA    . Anemia 1970s   on iron   . Arthritis   . Cubital tunnel syndrome on right 10/31/2012   Secondary to old fracture. Evaluated by ortho. [facility] offered surgery but patient refused. Numbness and burning pain 4th and 5th digit.    . Diabetes mellitus 2000   Never on insulin   . Hypertension 2000    Allergies Allergies  Allergen Reactions  . Levofloxacin Hives    IV Location/Drains/Wounds Patient Lines/Drains/Airways Status   Active Line/Drains/Airways    Name:   Placement date:   Placement time:   Site:   Days:   Peripheral IV 03/07/18 Left;Anterior Forearm   03/07/18    0851    Forearm   less than 1          Labs/Imaging Results for orders placed or performed during the hospital encounter of 03/07/18 (from the past 48 hour(s))  Protime-INR     Status: None   Collection Time: 03/06/18 10:41 PM  Result Value Ref Range   Prothrombin Time 11.7 11.4 - 15.2 seconds   INR 0.86     Comment:  Performed at [facility], [place] [address] [place], [place] [address]  APTT     Status: None   Collection Time: 03/06/18 10:41 PM  Result Value Ref Range   aPTT 31 24 - 36 seconds    Comment: Performed at [facility], [place] [address], [place], [place] [address]  CBC     Status: Abnormal   Collection Time: 03/06/18 10:41 PM  Result Value Ref Range   WBC 5.8 4.0 - 10.5 K/uL   RBC 3.66 (L) 3.87 - 5.11 MIL/uL   Hemoglobin 10.6 (L) 12.0 - 15.0 g/dL   HCT 33.3 (L) 36.0 - 46.0 %   MCV 91.0 78.0 - 100.0 fL   MCH 29.0 26.0 - 34.0 pg   MCHC 31.8 30.0 - 36.0 g/dL   RDW 13.2 11.5 - 15.5 %   Platelets 310 150 - 400 K/uL    Comment: Performed at [facility], [place] [address], [place], [place] [address]  Differential     Status: None   Collection Time: 03/06/18 10:41 PM  Result Value Ref Range   Neutrophils Relative % 46 %   Neutro Abs 2.7 1.7 - 7.7 K/uL   Lymphocytes Relative 45 %   Lymphs Abs 2.7 0.7 -  4.0 K/uL   Monocytes Relative 5 %   Monocytes Absolute 0.3 0.1 - 1.0 K/uL   Eosinophils Relative 4 %   Eosinophils Absolute 0.2 0.0 - 0.7 K/uL   Basophils Relative 0 %   Basophils Absolute 0.0 0.0 - 0.1 K/uL    Comment: Performed at [facility], [place] [address], [place], [place] [address]  Comprehensive metabolic panel     Status: Abnormal   Collection Time: 03/06/18 10:41 PM  Result Value Ref Range   Sodium 141 135 - 145 mmol/L   Potassium 3.9 3.5 - 5.1 mmol/L   Chloride 105 101 - 111 mmol/L   CO2 27 22 - 32 mmol/L   Glucose, Bld 209 (H) 65 - 99 mg/dL   BUN 18 6 - 20 mg/dL   Creatinine, Ser 0.97 0.44 - 1.00 mg/dL   Calcium 9.5 8.9 - 10.3 mg/dL   Total Protein 7.5 6.5 - 8.1 g/dL   Albumin 4.0 3.5 - 5.0 g/dL   AST 23 15 - 41 U/L   ALT 14 14 - 54 U/L   Alkaline Phosphatase 40 38 - 126 U/L   Total Bilirubin 0.1 (L) 0.3 - 1.2 mg/dL   GFR calc non Af Amer 59 (L) >60 mL/min   GFR calc Af Amer >60 >60 mL/min     Comment: (NOTE) The eGFR has been calculated using the CKD EPI equation. This calculation has not been validated in all clinical situations. eGFR's persistently <60 mL/min signify possible Chronic Kidney Disease.    Anion gap 9 5 - 15    Comment: Performed at [facility], [place] [address] [place], [place] [address]  I-stat troponin, ED     Status: None   Collection Time: 03/06/18 10:49 PM  Result Value Ref Range   Troponin i, poc 0.00 0.00 - 0.08 ng/mL   Comment 3            Comment: Due to the release kinetics of cTnI, a negative result within the first hours of the onset of symptoms does not rule out myocardial infarction with certainty. If myocardial infarction is still suspected, repeat the test at appropriate intervals.   I-Stat Chem 8, ED     Status: Abnormal   Collection Time: 03/06/18 10:50 PM  Result Value Ref Range   Sodium 140 135 - 145 mmol/L   Potassium 3.7 3.5 - 5.1 mmol/L   Chloride 102 101 - 111 mmol/L   BUN 17 6 - 20 mg/dL   Creatinine, Ser 0.90 0.44 - 1.00 mg/dL   Glucose, Bld 208 (H) 65 - 99 mg/dL   Calcium, Ion 1.23 1.15 - 1.40 mmol/L   TCO2 24 22 - 32 mmol/L   Hemoglobin 11.2 (L) 12.0 - 15.0 g/dL   HCT 33.0 (L) 36.0 - 46.0 %  CBG monitoring, ED     Status: Abnormal   Collection Time: 03/07/18  6:02 AM  Result Value Ref Range   Glucose-Capillary 115 (H) 65 - 99 mg/dL   Ct Head Wo Contrast  Result Date: 03/07/2018 CLINICAL DATA:  Initial evaluation for intermittent right-sided numbness, tingling for 2 days. EXAM: CT HEAD WITHOUT CONTRAST TECHNIQUE: Contiguous axial images were obtained from the base of the skull through the vertex without intravenous contrast. COMPARISON:  None. FINDINGS: Brain: Cerebral volume within normal limits for age. Mild chronic microvascular changes present within the periventricular white matter. Small remote right cerebellar infarct noted. No acute intracranial hemorrhage. No acute large vessel territory  infarct.  No mass lesion, midline shift or mass effect. No hydrocephalus. No extra-axial fluid collection. Vascular: No hyperdense vessel. Scattered vascular calcifications noted within the carotid siphons. Skull: Scalp soft tissues and calvarium within normal limits. Sinuses/Orbits: Globes and orbital soft tissues are normal. Paranasal sinuses and mastoid air cells are clear. Other: None. IMPRESSION: 1. No acute intracranial abnormality. 2. Small remote right cerebellar infarct. 3. Mild chronic small vessel ischemic changes. Electronically Signed   By: Jeannine Boga M.D.   On: 03/07/2018 05:08    Pending Labs Unresulted Labs (From admission, onward)   Start     Ordered   03/07/18 0818  TSH  Once,   R     03/07/18 0817   03/07/18 0818  Folate RBC  Once,   R     03/07/18 0817   03/07/18 0818  Vitamin B12  Once,   R     03/07/18 0817   03/07/18 0817  Hemoglobin A1c  Once,   R     03/07/18 0817   03/07/18 0817  Lipid panel  Once,   R    Comments:  Fasting    03/07/18 0817      Vitals/Pain Today's Vitals   03/07/18 0618 03/07/18 0624 03/07/18 0730 03/07/18 0830  BP:  (!) 157/72 (!) 151/75 (!) 159/74  Pulse:  85 71 78  Resp:  12  16  Temp: 97.8 F (36.6 C)     TempSrc:      SpO2:  99% 99% 100%  Weight:      Height:      PainSc:    0-No pain    Isolation Precautions No active isolations  Medications Medications  traZODone (DESYREL) tablet 100 mg (has no administration in time range)   stroke: mapping our early stages of recovery book (has no administration in time range)  0.9 %  sodium chloride infusion (has no administration in time range)  acetaminophen (TYLENOL) tablet 650 mg (has no administration in time range)    Or  acetaminophen (TYLENOL) solution 650 mg (has no administration in time range)    Or  acetaminophen (TYLENOL) suppository 650 mg (has no administration in time range)  senna-docusate (Senokot-S) tablet 1 tablet (has no administration in time range)   aspirin tablet 325 mg (has no administration in time range)  linagliptin (TRADJENTA) tablet 5 mg (has no administration in time range)    And  metFORMIN (GLUCOPHAGE) tablet 1,000 mg (has no administration in time range)    Mobility walks

## 2018-03-07 NOTE — Progress Notes (Signed)
Inpatient Diabetes Program Recommendations  AACE/ADA: New Consensus Statement on Inpatient Glycemic Control (2015)  Target Ranges:  Prepandial:   less than 140 mg/dL      Peak postprandial:   less than 180 mg/dL (1-2 hours)      Critically ill patients:  140 - 180 mg/dL   Lab Results  Component Value Date   GLUCAP 115 (H) 03/07/2018   HGBA1C 7.8 10/25/2016    Review of Glycemic Control  Diabetes history: Type 2 Outpatient Diabetes medications: Janumet Current orders for Inpatient glycemic control: Tradjenta 5 mg BID, Metformin 1000 mg BID  Inpatient Diabetes Program Recommendations:    Recommend patient  have CBGs checked TID & HS. Recommend adding Novolog SENSITIVE correction scale TID while in the hospital. Will continue to monitor blood sugars while in the hospital.  Harvel Ricks RN BSN CDE Diabetes Coordinator Pager: [phone]  8am-5pm

## 2018-03-07 NOTE — ED Notes (Signed)
Patient transported to MRI 

## 2018-03-07 NOTE — H&P (Signed)
History and Physical    Veronica Simmons XQJ:[id number] DOB: 03-Sep-1951 DOA: 03/07/2018  PCP: Bufford Lope, DO Patient coming from: home  Chief Complaint: Right sided weakness   HPI: Veronica Simmons is a 67 y.o. female with medical history significant of essential hypertension, diabetes mellitus type 2, peripheral neuropathy comes to the hospital complains of right-sided weakness.  Patient states she started having some right-sided shooting pain in the upper and lower extremity which has been mostly intermittent for the past 2 days lasting several minutes at a time but later that day she also started feeling weak.  Intermittently her weakness has resolved but then it keeps returning.  Due to the symptoms she decided to come to the ER last night for further evaluation.  She denies any neck pain or trauma.  Denies any previous history of stroke.  Denies any chest pain, shortness of breath, abdominal pain, nausea, vomiting and other complaints.  Admits of medication compliance.  In the ER patient was noted to be symptom-free and did not have any weakness.  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  Routine labs are unremarkable.  Given her risk factors she was admitted for TIA/stroke workup.   Review of Systems: As per HPI otherwise 10 point review of systems negative.  Review of Systems Otherwise negative except as per HPI, including: General: Denies fever, chills, night sweats or unintended weight loss. Resp: Denies cough, wheezing, shortness of breath. Cardiac: Denies chest pain, palpitations, orthopnea, paroxysmal nocturnal dyspnea. GI: Denies abdominal pain, nausea, vomiting, diarrhea or constipation GU: Denies dysuria, frequency, hesitancy or incontinence MS: Denies muscle aches, joint pain or swelling Neuro: Denies headache, neurologic deficits (focal weakness, numbness, tingling), abnormal  gait Psych: Denies anxiety, depression, SI/HI/AVH Skin: Denies new rashes or lesions ID: Denies sick contacts, exotic exposures, travel  Past Medical History:  Diagnosis Date  . ALLERGIC RHINITIS 02/19/2007   Qualifier: Diagnosis of  By: Lorne Skeens MD, VALENICA    . Anemia 1970s   on iron   . Arthritis   . Cubital tunnel syndrome on right 10/31/2012   Secondary to old fracture. Evaluated by ortho. [facility] offered surgery but patient refused. Numbness and burning pain 4th and 5th digit.    . Diabetes mellitus 2000   Never on insulin   . Hypertension 2000    History reviewed. No pertinent surgical history.   reports that she has never smoked. She has never used smokeless tobacco. She reports that she does not drink alcohol or use drugs.  Allergies  Allergen Reactions  . Levofloxacin Hives    Family History  Problem Relation Age of Onset  . Early death Mother 92       shot   . Heart disease Father 72  . Diabetes Daughter   . Kidney disease Daughter        on dialysis   . Diabetes Maternal Aunt   . Cancer Paternal Uncle        Breast Cancer   . Breast cancer Maternal Grandmother      Prior to Admission medications   Medication Sig Start Date End Date Taking? Authorizing Provider  amLODipine (NORVASC) 10 MG tablet Take 1 tablet (10 mg total) by mouth daily. 07/28/16  Yes Haney, Alyssa A, MD  JANUMET 50-1000 MG tablet TAKE 1 TABLET BY MOUTH TWICE DAILY WITH MEALS Patient taking differently: Taking 1 tab in PM only 07/14/16  Yes Haney, Alyssa A, MD  lisinopril (PRINIVIL,ZESTRIL) 20 MG tablet TAKE 1 TABLET BY MOUTH DAILY 01/01/17  Yes Haney, Alyssa A, MD  traZODone (DESYREL) 100 MG tablet TAKE 1/2 TO 1 TABLET BY MOUTH EVERY NIGHT AT BEDTIME AS NEEDED FOR SLEEP 06/15/16  Yes Haney, Alyssa A, MD  bacitracin 500 UNIT/GM ointment Apply 1 application topically 2 (two) times daily. Patient not taking: Reported on 03/07/2018 06/19/16   McKeag, Marylynn Pearson, MD  cyclobenzaprine  (FLEXERIL) 10 MG tablet Take 1 tablet (10 mg total) by mouth 2 (two) times daily as needed for muscle spasms. Patient not taking: Reported on 03/07/2018 09/20/16   Junius Creamer, NP  diclofenac (VOLTAREN) 50 MG EC tablet Take 1 tablet (50 mg total) by mouth 2 (two) times daily. Patient not taking: Reported on 03/07/2018 09/20/16   Junius Creamer, NP  gabapentin (NEURONTIN) 100 MG capsule Take 2 capsules (200 mg total) by mouth 3 (three) times daily. Patient not taking: Reported on 03/07/2018 10/25/16   Marina Goodell A, MD  glucose blood (ACCU-CHEK AVIVA PLUS) test strip Use as instructed 01/06/16   Marina Goodell A, MD  glucose blood (ACCU-CHEK SMARTVIEW) test strip Test blood sugar once daily 10/12/16   Haney, Alyssa A, MD  glucose blood test strip Use as instructed 12/29/15   Haney, Yetta Flock A, MD  indapamide (LOZOL) 2.5 MG tablet TAKE 1 TABLET BY MOUTH DAILY Patient not taking: Reported on 03/07/2018 08/07/16   Veatrice Bourbon, MD  Lancets (ONETOUCH ULTRASOFT) lancets Use as instructed 08/27/15   Haney, Yetta Flock A, MD  lidocaine (LIDODERM) 5 % Place 1 patch onto the skin daily. Remove & Discard patch within 12 hours or as directed by MD Patient not taking: Reported on 03/07/2018 09/20/16   Junius Creamer, NP  loperamide (IMODIUM A-D) 2 MG tablet Take 1 tablet (2 mg total) by mouth 4 (four) times daily as needed for diarrhea or loose stools. Patient not taking: Reported on 03/07/2018 01/06/16   Veatrice Bourbon, MD  sitaGLIPtin-metformin (JANUMET) 50-500 MG tablet Take 1 tablet by mouth 2 (two) times daily with a meal. Patient not taking: Reported on 03/07/2018 04/05/16   Veatrice Bourbon, MD    Physical Exam: Vitals:   03/07/18 0320 03/07/18 0518 03/07/18 0618 03/07/18 0624  BP: (!) 167/83   (!) 157/72  Pulse: 79 75  85  Resp: 19 15  12   Temp:   97.8 F (36.6 C)   TempSrc:      SpO2: 98% 96%  99%  Weight:      Height:          Constitutional: NAD, calm, comfortable, obese Vitals:   03/07/18 0320 03/07/18  0518 03/07/18 0618 03/07/18 0624  BP: (!) 167/83   (!) 157/72  Pulse: 79 75  85  Resp: 19 15  12   Temp:   97.8 F (36.6 C)   TempSrc:      SpO2: 98% 96%  99%  Weight:      Height:       Eyes: PERRL, lids and conjunctivae normal ENMT: Mucous membranes are moist. Posterior pharynx clear of any exudate or lesions.Normal dentition.  Neck: normal, supple, no masses, no thyromegaly Respiratory: clear to auscultation bilaterally, no wheezing, no crackles. Normal respiratory effort. No accessory muscle use.  Cardiovascular: Regular rate and rhythm, no murmurs / rubs / gallops. No extremity edema. 2+ pedal pulses. No carotid bruits.  Abdomen: no tenderness, no masses palpated. No hepatosplenomegaly. Bowel sounds positive.  Musculoskeletal: no  clubbing / cyanosis. No joint deformity upper and lower extremities. Good ROM, no contractures. Normal muscle tone.  Skin: no rashes, lesions, ulcers. No induration Neurologic: CN 2-12 grossly intact. Sensation intact, DTR normal. Strength 5/5 in all 4.  Psychiatric: Normal judgment and insight. Alert and oriented x 3. Normal mood.     Labs on Admission: I have personally reviewed following labs and imaging studies  CBC: Recent Labs  Lab 03/06/18 2241 03/06/18 2250  WBC 5.8  --   NEUTROABS 2.7  --   HGB 10.6* 11.2*  HCT 33.3* 33.0*  MCV 91.0  --   PLT 310  --    Basic Metabolic Panel: Recent Labs  Lab 03/06/18 2241 03/06/18 2250  NA 141 140  K 3.9 3.7  CL 105 102  CO2 27  --   GLUCOSE 209* 208*  BUN 18 17  CREATININE 0.97 0.90  CALCIUM 9.5  --    GFR: Estimated Creatinine Clearance: 71.3 mL/min (by C-G formula based on SCr of 0.9 mg/dL). Liver Function Tests: Recent Labs  Lab 03/06/18 2241  AST 23  ALT 14  ALKPHOS 40  BILITOT 0.1*  PROT 7.5  ALBUMIN 4.0   No results for input(s): LIPASE, AMYLASE in the last 168 hours. No results for input(s): AMMONIA in the last 168 hours. Coagulation Profile: Recent Labs  Lab  03/06/18 2241  INR 0.86   Cardiac Enzymes: No results for input(s): CKTOTAL, CKMB, CKMBINDEX, TROPONINI in the last 168 hours. BNP (last 3 results) No results for input(s): PROBNP in the last 8760 hours. HbA1C: No results for input(s): HGBA1C in the last 72 hours. CBG: Recent Labs  Lab 03/07/18 0602  GLUCAP 115*   Lipid Profile: No results for input(s): CHOL, HDL, LDLCALC, TRIG, CHOLHDL, LDLDIRECT in the last 72 hours. Thyroid Function Tests: No results for input(s): TSH, T4TOTAL, FREET4, T3FREE, THYROIDAB in the last 72 hours. Anemia Panel: No results for input(s): VITAMINB12, FOLATE, FERRITIN, TIBC, IRON, RETICCTPCT in the last 72 hours. Urine analysis:    Component Value Date/Time   COLORURINE YELLOW 09/20/2016 0012   APPEARANCEUR CLEAR 09/20/2016 0012   LABSPEC 1.031 (H) 09/20/2016 0012   PHURINE 5.0 09/20/2016 0012   GLUCOSEU >1000 (A) 09/20/2016 0012   HGBUR NEGATIVE 09/20/2016 0012   BILIRUBINUR NEGATIVE 09/20/2016 0012   BILIRUBINUR negative 07/17/2013 0946   KETONESUR NEGATIVE 09/20/2016 0012   PROTEINUR 30 (A) 09/20/2016 0012   UROBILINOGEN 1.0 07/17/2013 0946   UROBILINOGEN 1.0 05/27/2012 2055   NITRITE NEGATIVE 09/20/2016 0012   LEUKOCYTESUR NEGATIVE 09/20/2016 0012   Sepsis Labs: !!!!!!!!!!!!!!!!!!!!!!!!!!!!!!!!!!!!!!!!!!!! @LABRCNTIP (procalcitonin:4,lacticidven:4) )No results found for this or any previous visit (from the past 240 hour(s)).   Radiological Exams on Admission: Ct Head Wo Contrast  Result Date: 03/07/2018 CLINICAL DATA:  Initial evaluation for intermittent right-sided numbness, tingling for 2 days. EXAM: CT HEAD WITHOUT CONTRAST TECHNIQUE: Contiguous axial images were obtained from the base of the skull through the vertex without intravenous contrast. COMPARISON:  None. FINDINGS: Brain: Cerebral volume within normal limits for age. Mild chronic microvascular changes present within the periventricular white matter. Small remote right  cerebellar infarct noted. No acute intracranial hemorrhage. No acute large vessel territory infarct. No mass lesion, midline shift or mass effect. No hydrocephalus. No extra-axial fluid collection. Vascular: No hyperdense vessel. Scattered vascular calcifications noted within the carotid siphons. Skull: Scalp soft tissues and calvarium within normal limits. Sinuses/Orbits: Globes and orbital soft tissues are normal. Paranasal sinuses and mastoid air cells are clear. Other: None.  IMPRESSION: 1. No acute intracranial abnormality. 2. Small remote right cerebellar infarct. 3. Mild chronic small vessel ischemic changes. Electronically Signed   By: Jeannine Boga M.D.   On: 03/07/2018 05:08     Assessment/Plan Principal Problem:   Right sided weakness Active Problems:   Type 2 diabetes, controlled, with neuropathy (HCC)   HTN (hypertension)   Obesity (BMI 30-39.9)   Right upper extremity and right lower extremity weakness, resolved -admit forTelemetry monitoring for obs, possible TIA.  -Stroke protocol; CT of the head shows no acute abnormality but small remote right cerebellar infarct and mild chronic vessel changes -Allow for permissive hypertension for the first 24-48h - only treat PRN if SBP >220 mmHg. Blood pressures can be gradually normalized to SBP<140 upon discharge.  I will hold her Norvasc and lisinopril today -MRI brain without contrast -Maintain Euthermia.  -We will order aspirin 325 mg once -Lipid Panel, TSH, B12, folate and A1C.  Some suspicion of neuropathy -Frequent neuro checks -Risk factor modification -If MRI is positive we can consider further workup such as echocardiogram and carotid ultrasound  Essential hypertension -Norvasc and lisinopril on hold due to permissive hypertension  Diabetes mellitus type 2 with peripheral neuropathy -Checking hemoglobin A1c, continue her home oral medication -Accu-Chek and sliding scale  Hyperlipidemia -Not on any home  medications, checking lipids  Obesity with BMI greater than 35 -Counseled on weight loss, diet and exercise         DVT prophylaxis: Early ambulation Code Status: Full code Family Communication: Family at bedside Disposition Plan: Likely discharge in 24 hours if the MRI is negative for acute stroke Consults called: None Admission status: Observation   Ankit Arsenio Loader MD [facility] Pager [phone]  If 7PM-7AM, please contact night-coverage [email or web address] Password [facility]  03/07/2018, 8:19 AM

## 2018-03-07 NOTE — ED Provider Notes (Signed)
[place] DEPT Provider Note   CSN: [id number] Arrival date & time: 03/06/18  2114     History   Chief Complaint Chief Complaint  Patient presents with  . Numbness    HPI Veronica Simmons is a 67 y.o. female.  Patient presents to the emergency department with chief complaint of intermittent numbness and tingling to her right upper and lower extremities.  Onset was 2 days ago.  She denies any headache, vision changes, speech changes, or any other associated symptoms.  She states that she does have a history of diabetic neuropathy as well as carpal tunnel syndrome in her right upper extremity.  He denies any chest pain or shortness of breath.  She has not taken anything for symptoms.  She states that the symptoms come and go.  The history is provided by the patient. No language interpreter was used.    Past Medical History:  Diagnosis Date  . ALLERGIC RHINITIS 02/19/2007   Qualifier: Diagnosis of  By: Lorne Skeens MD, VALENICA    . Anemia 1970s   on iron   . Arthritis   . Cubital tunnel syndrome on right 10/31/2012   Secondary to old fracture. Evaluated by ortho. [facility] offered surgery but patient refused. Numbness and burning pain 4th and 5th digit.    . Diabetes mellitus 2000   Never on insulin   . Hypertension 2000    Patient Active Problem List   Diagnosis Date Noted  . Skin papules, generalized 08/04/2016  . Skin lesion of right lower limb 06/19/2016  . Leg swelling 05/12/2016  . Subconjunctival hemorrhage of right eye 04/27/2016  . Diarrhea 01/09/2016  . Health care maintenance 06/28/2015  . Anemia 12/21/2014  . Palpitations 01/07/2014  . Recurrent yeast vaginitis 11/05/2013  . Recurrent genital herpes 07/17/2013  . Hot flashes not due to menopause 11/27/2012  . Cubital tunnel syndrome on right 10/31/2012  . Type 2 diabetes, controlled, with neuropathy ([place]) 02/14/2007  . HYPERLIPIDEMIA 02/14/2007  . OBESITY, NOS  02/14/2007  . HYPERTENSION, BENIGN SYSTEMIC 02/14/2007  . ARTHRITIS 02/14/2007  . Insomnia 02/14/2007    History reviewed. No pertinent surgical history.  OB History   None      Home Medications    Prior to Admission medications   Medication Sig Start Date End Date Taking? Authorizing Provider  amLODipine (NORVASC) 10 MG tablet Take 1 tablet (10 mg total) by mouth daily. 07/28/16   Haney, Yetta Flock A, MD  bacitracin 500 UNIT/GM ointment Apply 1 application topically 2 (two) times daily. 06/19/16   McKeag, Marylynn Pearson, MD  cyclobenzaprine (FLEXERIL) 10 MG tablet Take 1 tablet (10 mg total) by mouth 2 (two) times daily as needed for muscle spasms. 09/20/16   Junius Creamer, NP  diclofenac (VOLTAREN) 50 MG EC tablet Take 1 tablet (50 mg total) by mouth 2 (two) times daily. 09/20/16   Junius Creamer, NP  gabapentin (NEURONTIN) 100 MG capsule Take 2 capsules (200 mg total) by mouth 3 (three) times daily. 10/25/16   Haney, Yetta Flock A, MD  glucose blood (ACCU-CHEK AVIVA PLUS) test strip Use as instructed 01/06/16   Marina Goodell A, MD  glucose blood (ACCU-CHEK SMARTVIEW) test strip Test blood sugar once daily 10/12/16   Haney, Alyssa A, MD  glucose blood test strip Use as instructed 12/29/15   Haney, Alyssa A, MD  indapamide (LOZOL) 2.5 MG tablet TAKE 1 TABLET BY MOUTH DAILY 08/07/16   Haney, Alyssa A, MD  JANUMET 50-1000 MG tablet TAKE  1 TABLET BY MOUTH TWICE DAILY WITH MEALS Patient taking differently: Taking 1 tab in PM only 07/14/16   Haney, Alyssa A, MD  Lancets (ONETOUCH ULTRASOFT) lancets Use as instructed 08/27/15   Haney, Alyssa A, MD  lidocaine (LIDODERM) 5 % Place 1 patch onto the skin daily. Remove & Discard patch within 12 hours or as directed by MD 09/20/16   Junius Creamer, NP  lisinopril (PRINIVIL,ZESTRIL) 20 MG tablet TAKE 1 TABLET BY MOUTH DAILY 01/01/17   Haney, Yetta Flock A, MD  loperamide (IMODIUM A-D) 2 MG tablet Take 1 tablet (2 mg total) by mouth 4 (four) times daily as needed for diarrhea or loose  stools. 01/06/16   Veatrice Bourbon, MD  Multiple Vitamin (MULTIVITAMIN WITH MINERALS) TABS tablet Take 1 tablet by mouth daily.    [provider]  omega-3 acid ethyl esters (LOVAZA) 1 g capsule Take 1 g by mouth daily.    [provider]  potassium chloride SA (K-DUR,KLOR-CON) 20 MEQ tablet Take 20 mEq by mouth daily.    [provider]  sitaGLIPtin-metformin (JANUMET) 50-500 MG tablet Take 1 tablet by mouth 2 (two) times daily with a meal. Patient taking differently: Take 1 tablet by mouth 2 (two) times daily with a meal.  04/05/16   Haney, Alyssa A, MD  traZODone (DESYREL) 100 MG tablet TAKE 1/2 TO 1 TABLET BY MOUTH EVERY NIGHT AT BEDTIME AS NEEDED FOR SLEEP 06/15/16   Veatrice Bourbon, MD    Family History Family History  Problem Relation Age of Onset  . Early death Mother 67       shot   . Heart disease Father 34  . Diabetes Daughter   . Kidney disease Daughter        on dialysis   . Diabetes Maternal Aunt   . Cancer Paternal Uncle        Breast Cancer   . Breast cancer Maternal Grandmother     Social History Social History   Tobacco Use  . Smoking status: Never Smoker  . Smokeless tobacco: Never Used  Substance Use Topics  . Alcohol use: No  . Drug use: No     Allergies   Levofloxacin   Review of Systems Review of Systems  All other systems reviewed and are negative.    Physical Exam Updated Vital Signs BP (!) 178/75 (BP Location: Left Arm)   Pulse 90   Temp 98.1 F (36.7 C) (Oral)   Resp 18   Ht 5\' 5"  (1.651 m)   Wt 100.7 kg (222 lb)   SpO2 98%   BMI 36.94 kg/m   Physical Exam  Constitutional: She is oriented to person, place, and time. She appears well-developed and well-nourished.  HENT:  Head: Normocephalic and atraumatic.  Eyes: Pupils are equal, round, and reactive to light. Conjunctivae and EOM are normal.  Neck: Normal range of motion. Neck supple.  Cardiovascular: Normal rate and regular rhythm. Exam reveals no  gallop and no friction rub.  No murmur heard. Pulmonary/Chest: Effort normal and breath sounds normal. No respiratory distress. She has no wheezes. She has no rales. She exhibits no tenderness.  Abdominal: Soft. Bowel sounds are normal. She exhibits no distension and no mass. There is no tenderness. There is no rebound and no guarding.  Musculoskeletal: Normal range of motion. She exhibits no edema or tenderness.  Range of motion and strength of bilateral upper and lower extremities is 5/5  Neurological: She is alert and oriented to person, place,  and time.  CN III-XII intact, speech is clear, movements are goal oriented, sensation and strength intact throughout  Skin: Skin is warm and dry.  Psychiatric: She has a normal mood and affect. Her behavior is normal. Judgment and thought content normal.  Nursing note and vitals reviewed.    ED Treatments / Results  Labs (all labs ordered are listed, but only abnormal results are displayed) Labs Reviewed  CBC - Abnormal; Notable for the following components:      Result Value   RBC 3.66 (*)    Hemoglobin 10.6 (*)    HCT 33.3 (*)    All other components within normal limits  COMPREHENSIVE METABOLIC PANEL - Abnormal; Notable for the following components:   Glucose, Bld 209 (*)    Total Bilirubin 0.1 (*)    GFR calc non Af Amer 59 (*)    All other components within normal limits  I-STAT CHEM 8, ED - Abnormal; Notable for the following components:   Glucose, Bld 208 (*)    Hemoglobin 11.2 (*)    HCT 33.0 (*)    All other components within normal limits  PROTIME-INR  APTT  DIFFERENTIAL  I-STAT TROPONIN, ED  CBG MONITORING, ED    EKG  EKG Interpretation  Date/Time:  Wednesday March 06 2018 22:49:06 EDT Ventricular Rate:  81 PR Interval:    QRS Duration: 93 QT Interval:  388 QTC Calculation: 451 R Axis:   65 Text Interpretation:  Sinus rhythm Normal ECG When compared with ECG of 01/07/2014, Nonspecific T wave abnormality is no  longer present Confirmed by Delora Fuel ([id number]) on 03/06/2018 10:57:18 PM       Radiology No results found.  Procedures Procedures (including critical care time)  Medications Ordered in ED Medications - No data to display   Initial Impression / Assessment and Plan / ED Course  I have reviewed the triage vital signs and the nursing notes.  Pertinent labs & imaging results that were available during my care of the patient were reviewed by me and considered in my medical decision making (see chart for details).     Patient with intermittent numbness and tingling to her right upper and lower extremities.  She has no weakness on exam, no sensation deficits.  She is neurovascularly intact.  She does have a history of peripheral neuropathy from her diabetes as well as right upper extremity carpal tunnel syndrome. Will check CT to look for stroke.  CT shows remote cerebellar infarct.  Has also had some intermittent slurred speech.   Seen by discussed with Dr. Roxanne Mins, who recommends admission for TIA workup.  Appreciate Dr. Alcario Drought for accepting patient for admission.  Final Clinical Impressions(s) / ED Diagnoses   Final diagnoses:  TIA (transient ischemic attack)    ED Discharge Orders    None       Montine Circle, PA-C 38/18/29 [id number]    Delora Fuel, MD 69/67/89 [phone]

## 2018-03-08 DIAGNOSIS — R531 Weakness: Secondary | ICD-10-CM | POA: Diagnosis not present

## 2018-03-08 DIAGNOSIS — E1140 Type 2 diabetes mellitus with diabetic neuropathy, unspecified: Secondary | ICD-10-CM

## 2018-03-08 DIAGNOSIS — I10 Essential (primary) hypertension: Secondary | ICD-10-CM

## 2018-03-08 LAB — FOLATE RBC
Folate, Hemolysate: 427.3 ng/mL
Folate, RBC: 1299 ng/mL (ref >498)
Hematocrit: 32.9 % — ABNORMAL LOW (ref 34.0–46.6)

## 2018-03-08 LAB — GLUCOSE, CAPILLARY: GLUCOSE-CAPILLARY: 149 mg/dL — AB (ref 65–99)

## 2018-03-08 MED ORDER — LISINOPRIL 40 MG PO TABS: 40.0000 mg | ORAL_TABLET | Freq: Every day | ORAL | 0 refills | Status: DC

## 2018-03-08 MED ORDER — AMLODIPINE BESYLATE 10 MG PO TABS
10.0000 mg | ORAL_TABLET | Freq: Every day | ORAL | Status: DC
  Administered 2018-03-08: 10 mg via ORAL
  Filled 2018-03-08: qty 1

## 2018-03-08 MED ORDER — LISINOPRIL 20 MG PO TABS
20.0000 mg | ORAL_TABLET | Freq: Every day | ORAL | Status: DC
  Administered 2018-03-08: 20 mg via ORAL
  Filled 2018-03-08: qty 1

## 2018-03-08 MED ORDER — LISINOPRIL 20 MG PO TABS
20.0000 mg | ORAL_TABLET | Freq: Once | ORAL | Status: AC
  Administered 2018-03-08: 20 mg via ORAL
  Filled 2018-03-08: qty 1

## 2018-03-08 NOTE — Progress Notes (Deleted)
Physician Discharge Summary  Veronica Simmons  TFT:[id number]  DOB: 05/04/1951  DOA: 03/07/2018 PCP: Bufford Lope, DO  Admit date: 03/07/2018 Discharge date: 03/08/2018  Admitted From: Home  Disposition: Home   Recommendations for Outpatient Follow-up:  1. Follow up with PCP in 1 week  2. BP monitoring   Discharge Condition: Stable CODE STATUS: Full code Diet recommendation: Heart Healthy / Carb Modified   Brief/Interim Summary: For full details see H&P/Progress note, but in brief, Veronica Simmons is a 67 year old female with medical history significant for hypertension, diabetes mellitus type 2 and peripheral neuropathy presented to the emergency department complaining of right side tingling sensation and weakness.  Symptoms started with right-sided shooting pain in the upper and lower extremities and then developed some intermittent weakness.  Upon ED evaluation patient was already symptoms free and did not have any weakness, CT of the head was done which showed no acute abnormalities, patient was placed in observation for MRI evaluation which did not show any strokes or acute abnormalities.  During hospital stay patient's blood pressure was elevated therefore lisinopril was increased.  Patientclinical improvement feeling now back to baseline therefore no further workup was indicated and patient was discharged home to follow-up with PCP.  Subjective: Patient seen and examined, has no complaints, report her weakness and tingling sensation has resolved.  Denies pain, nausea, vomiting dizziness and palpitations.  Discharge Diagnoses/Hospital Course:  Intermittent unilateral right-sided weakness - resolved Unable to  determinate clinically if this was a TIA versus complication of elevated blood pressure. CT of the head and MRI of the brain shows no acute abnormality and is negative for stroke Patient is asymptomatic at this time. Will start aspirin for secondary prevention as patient has  prior history of CVA No further workup indicated at this time and patient can follow-up as an outpatient.  Essential hypertension BP was slightly elevated during hospital stay, patient reported that she has been struggling on controlling her blood pressure.  We will continue Norvasc 10 mg daily and lisinopril will be increased to 40 mg daily. Follow-up with PCP in 1 week to monitor blood pressure  Diabetes mellitus type 2 with peripheral neuropathy Hemoglobin A1c is 5.9 well-controlled Continue home medications without any adjustment  Hypercholesterolemia LDL 122 goal is less than 70 Advised to start statin, patient declines at this time would like to try lifestyle modification on more natural methods.  Advised flaxseed, and omega-3.  All other chronic medical condition were stable during the hospitalization.  On the day of the discharge the patient's vitals were stable, and no other acute medical condition were reported by patient. the patient was felt safe to be discharge to home  Discharge Instructions  You were cared for by a hospitalist during your hospital stay. If you have any questions about your discharge medications or the care you received while you were in the hospital after you are discharged, you can call the unit and asked to speak with the hospitalist on call if the hospitalist that took care of you is not available. Once you are discharged, your primary care physician will handle any further medical issues. Please note that NO REFILLS for any discharge medications will be authorized once you are discharged, as it is imperative that you return to your primary care physician (or establish a relationship with a primary care physician if you do not have one) for your aftercare needs so that they can reassess your need for medications and monitor your lab values.  Discharge  Instructions    Call MD for:  difficulty breathing, headache or visual disturbances   Complete by:  As  directed    Call MD for:  extreme fatigue   Complete by:  As directed    Call MD for:  hives   Complete by:  As directed    Call MD for:  persistant dizziness or light-headedness   Complete by:  As directed    Call MD for:  persistant nausea and vomiting   Complete by:  As directed    Call MD for:  redness, tenderness, or signs of infection (pain, swelling, redness, odor or green/yellow discharge around incision site)   Complete by:  As directed    Call MD for:  severe uncontrolled pain   Complete by:  As directed    Call MD for:  temperature >100.4   Complete by:  As directed    Diet - low sodium heart healthy   Complete by:  As directed    Increase activity slowly   Complete by:  As directed      Allergies as of 03/08/2018      Reactions   Levofloxacin Hives      Medication List    STOP taking these medications   bacitracin 500 UNIT/GM ointment   cyclobenzaprine 10 MG tablet Commonly known as:  FLEXERIL   diclofenac 50 MG EC tablet Commonly known as:  VOLTAREN   gabapentin 100 MG capsule Commonly known as:  NEURONTIN   indapamide 2.5 MG tablet Commonly known as:  LOZOL   lidocaine 5 % Commonly known as:  LIDODERM   loperamide 2 MG tablet Commonly known as:  IMODIUM A-D     TAKE these medications   amLODipine 10 MG tablet Commonly known as:  NORVASC Take 1 tablet (10 mg total) by mouth daily.   glucose blood test strip Use as instructed   glucose blood test strip Commonly known as:  ACCU-CHEK AVIVA PLUS Use as instructed   glucose blood test strip Commonly known as:  ACCU-CHEK SMARTVIEW Test blood sugar once daily   JANUMET 50-1000 MG tablet Generic drug:  sitaGLIPtin-metformin TAKE 1 TABLET BY MOUTH TWICE DAILY WITH MEALS What changed:    how much to take  how to take this  when to take this  Another medication with the same name was removed. Continue taking this medication, and follow the directions you see here.   lisinopril 40 MG  tablet Commonly known as:  PRINIVIL,ZESTRIL Take 1 tablet (40 mg total) by mouth daily. What changed:    medication strength  how much to take   onetouch ultrasoft lancets Use as instructed   traZODone 100 MG tablet Commonly known as:  DESYREL TAKE 1/2 TO 1 TABLET BY MOUTH EVERY NIGHT AT BEDTIME AS NEEDED FOR SLEEP      Follow-up Information    Bufford Lope, DO Follow up.   Specialty:  Family Medicine Why:  Hospital follow-up Contact information: [address] [place] [address] [phone]          Allergies  Allergen Reactions  . Levofloxacin Hives    Consultations:     Procedures/Studies: Ct Head Wo Contrast  Result Date: 03/07/2018 CLINICAL DATA:  Initial evaluation for intermittent right-sided numbness, tingling for 2 days. EXAM: CT HEAD WITHOUT CONTRAST TECHNIQUE: Contiguous axial images were obtained from the base of the skull through the vertex without intravenous contrast. COMPARISON:  None. FINDINGS: Brain: Cerebral volume within normal limits for age. Mild  chronic microvascular changes present within the periventricular white matter. Small remote right cerebellar infarct noted. No acute intracranial hemorrhage. No acute large vessel territory infarct. No mass lesion, midline shift or mass effect. No hydrocephalus. No extra-axial fluid collection. Vascular: No hyperdense vessel. Scattered vascular calcifications noted within the carotid siphons. Skull: Scalp soft tissues and calvarium within normal limits. Sinuses/Orbits: Globes and orbital soft tissues are normal. Paranasal sinuses and mastoid air cells are clear. Other: None. IMPRESSION: 1. No acute intracranial abnormality. 2. Small remote right cerebellar infarct. 3. Mild chronic small vessel ischemic changes. Electronically Signed   By: Jeannine Boga M.D.   On: 03/07/2018 05:08   Mr Brain Wo Contrast  Result Date: 03/07/2018 CLINICAL DATA:  TIA.  Right-sided numbness tingling and weakness.  EXAM: MRI HEAD WITHOUT CONTRAST TECHNIQUE: Multiplanar, multiecho pulse sequences of the brain and surrounding structures were obtained without intravenous contrast. COMPARISON:  CT head 03/07/2018 FINDINGS: Brain: Negative for acute infarct. Chronic infarct right cerebellum. Minimal chronic changes in the cerebral white matter. Negative for hemorrhage mass or edema. Negative for hydrocephalus. No midline shift. Vascular: Normal arterial flow voids Skull and upper cervical spine: Negative Sinuses/Orbits: Negative Other: None IMPRESSION: Negative for acute infarct. Chronic infarct right cerebellum. No acute abnormality. Electronically Signed   By: Franchot Gallo M.D.   On: 03/07/2018 10:25   Mm Screening Breast Tomo Bilateral  Result Date: 02/11/2018 CLINICAL DATA:  Screening. EXAM: DIGITAL SCREENING BILATERAL MAMMOGRAM WITH TOMO AND CAD COMPARISON:  Previous exam(s). ACR Breast Density Category c: The breast tissue is heterogeneously dense, which may obscure small masses. FINDINGS: There are no findings suspicious for malignancy. Images were processed with CAD. IMPRESSION: No mammographic evidence of malignancy. A result letter of this screening mammogram will be mailed directly to the patient. RECOMMENDATION: Screening mammogram in one year. (Code:SM-B-01Y) BI-RADS CATEGORY  1: Negative. Electronically Signed   By: Fidela Salisbury M.D.   On: 02/11/2018 13:52    Discharge Exam: Vitals:   03/08/18 0437 03/08/18 0900  BP: (!) 166/69 (!) 175/72  Pulse: 71 73  Resp: 17 17  Temp: 97.7 F (36.5 C) 97.9 F (36.6 C)  SpO2: 100% 100%   Vitals:   03/07/18 2125 03/08/18 0010 03/08/18 0437 03/08/18 0900  BP: (!) 152/60 (!) 176/77 (!) 166/69 (!) 175/72  Pulse: 71 78 71 73  Resp: 17 18 17 17   Temp: 98.4 F (36.9 C) 98.6 F (37 C) 97.7 F (36.5 C) 97.9 F (36.6 C)  TempSrc: Oral Oral Oral Oral  SpO2: 99% 97% 100% 100%  Weight:      Height:        General: Pt is alert, awake, not in acute  distress Cardiovascular: RRR, S1/S2 +, no rubs, no gallops Respiratory: CTA bilaterally, no wheezing, no rhonchi Abdominal: Soft, NT, ND, bowel sounds + Extremities: no edema, no cyanosis Neurology: Nonfocal, strength 5/5, sensation intact  The results of significant diagnostics from this hospitalization (including imaging, microbiology, ancillary and laboratory) are listed below for reference.     Microbiology: No results found for this or any previous visit (from the past 240 hour(s)).   Labs: BNP (last 3 results) No results for input(s): BNP in the last 8760 hours. Basic Metabolic Panel: Recent Labs  Lab 03/06/18 2241 03/06/18 2250  NA 141 140  K 3.9 3.7  CL 105 102  CO2 27  --   GLUCOSE 209* 208*  BUN 18 17  CREATININE 0.97 0.90  CALCIUM 9.5  --  Liver Function Tests: Recent Labs  Lab 03/06/18 2241  AST 23  ALT 14  ALKPHOS 40  BILITOT 0.1*  PROT 7.5  ALBUMIN 4.0   No results for input(s): LIPASE, AMYLASE in the last 168 hours. No results for input(s): AMMONIA in the last 168 hours. CBC: Recent Labs  Lab 03/06/18 2241 03/06/18 2250  WBC 5.8  --   NEUTROABS 2.7  --   HGB 10.6* 11.2*  HCT 33.3* 33.0*  MCV 91.0  --   PLT 310  --    Cardiac Enzymes: No results for input(s): CKTOTAL, CKMB, CKMBINDEX, TROPONINI in the last 168 hours. BNP: Invalid input(s): POCBNP CBG: Recent Labs  Lab 03/07/18 0602 03/08/18 0800  GLUCAP 115* 149*   D-Dimer No results for input(s): DDIMER in the last 72 hours. Hgb A1c Recent Labs    03/07/18 1049  HGBA1C 5.9*   Lipid Profile Recent Labs    03/07/18 1049  CHOL 207*  HDL 50  LDLCALC 122*  TRIG 176*  CHOLHDL 4.1   Thyroid function studies Recent Labs    03/07/18 1049  TSH 1.316   Anemia work up Recent Labs    03/07/18 1049  VITAMINB12 1,746*   Urinalysis    Component Value Date/Time   COLORURINE YELLOW 09/20/2016 0012   APPEARANCEUR CLEAR 09/20/2016 0012   LABSPEC 1.031 (H) 09/20/2016  0012   PHURINE 5.0 09/20/2016 0012   GLUCOSEU >1000 (A) 09/20/2016 0012   HGBUR NEGATIVE 09/20/2016 0012   BILIRUBINUR NEGATIVE 09/20/2016 0012   BILIRUBINUR negative 07/17/2013 0946   KETONESUR NEGATIVE 09/20/2016 0012   PROTEINUR 30 (A) 09/20/2016 0012   UROBILINOGEN 1.0 07/17/2013 0946   UROBILINOGEN 1.0 05/27/2012 2055   NITRITE NEGATIVE 09/20/2016 0012   LEUKOCYTESUR NEGATIVE 09/20/2016 0012   Sepsis Labs Invalid input(s): PROCALCITONIN,  WBC,  LACTICIDVEN Microbiology No results found for this or any previous visit (from the past 240 hour(s)).   Time coordinating discharge: 32 minutes  SIGNED:  Chipper Oman, MD  [facility] 03/08/2018, 12:05 PM  Pager please text page via  [email or web address]  Note - This record has been created using [employer]. Chart creation errors have been sought, but may not always have been located. Such creation errors do not reflect on the standard of medical care.

## 2018-03-11 NOTE — Discharge Summary (Signed)
Physician Discharge Summary  Veronica Simmons  GGY:[id number]  DOB: 1951-03-22  DOA: 03/07/2018 PCP: Bufford Lope, DO  Admit date: 03/07/2018 Discharge date: 03/11/2018  Admitted From: Home  Disposition: Home   Recommendations for Outpatient Follow-up:  1. Follow up with PCP in 1 week  2. BP monitoring   Discharge Condition: Stable CODE STATUS: Full code Diet recommendation: Heart Healthy / Carb Modified   Brief/Interim Summary: For full details see H&P/Progress note, but in brief, Veronica Simmons is a 67 year old female with medical history significant for hypertension, diabetes mellitus type 2 and peripheral neuropathy presented to the emergency department complaining of right side tingling sensation and weakness.  Symptoms started with right-sided shooting pain in the upper and lower extremities and then developed some intermittent weakness.  Upon ED evaluation patient was already symptoms free and did not have any weakness, CT of the head was done which showed no acute abnormalities, patient was placed in observation for MRI evaluation which did not show any strokes or acute abnormalities.  During hospital stay patient's blood pressure was elevated therefore lisinopril was increased.  Patientclinical improvement feeling now back to baseline therefore no further workup was indicated and patient was discharged home to follow-up with PCP.  Subjective: Patient seen and examined, has no complaints, report her weakness and tingling sensation has resolved.  Denies pain, nausea, vomiting dizziness and palpitations.  Discharge Diagnoses/Hospital Course:  Intermittent unilateral right-sided weakness - resolved Unable to  determinate clinically if this was a TIA versus complication of elevated blood pressure. CT of the head and MRI of the brain shows no acute abnormality and is negative for stroke Patient is asymptomatic at this time. Will start aspirin for secondary prevention as patient has  prior history of CVA No further workup indicated at this time and patient can follow-up as an outpatient.  Essential hypertension BP was slightly elevated during hospital stay, patient reported that she has been struggling on controlling her blood pressure.  We will continue Norvasc 10 mg daily and lisinopril will be increased to 40 mg daily. Follow-up with PCP in 1 week to monitor blood pressure  Diabetes mellitus type 2 with peripheral neuropathy Hemoglobin A1c is 5.9 well-controlled Continue home medications without any adjustment  Hypercholesterolemia LDL 122 goal is less than 70 Advised to start statin, patient declines at this time would like to try lifestyle modification on more natural methods.  Advised flaxseed, and omega-3.  All other chronic medical condition were stable during the hospitalization.  On the day of the discharge the patient's vitals were stable, and no other acute medical condition were reported by patient. the patient was felt safe to be discharge to home  Discharge Instructions  You were cared for by a hospitalist during your hospital stay. If you have any questions about your discharge medications or the care you received while you were in the hospital after you are discharged, you can call the unit and asked to speak with the hospitalist on call if the hospitalist that took care of you is not available. Once you are discharged, your primary care physician will handle any further medical issues. Please note that NO REFILLS for any discharge medications will be authorized once you are discharged, as it is imperative that you return to your primary care physician (or establish a relationship with a primary care physician if you do not have one) for your aftercare needs so that they can reassess your need for medications and monitor your lab values.  Discharge  Instructions    Call MD for:  difficulty breathing, headache or visual disturbances   Complete by:  As  directed    Call MD for:  extreme fatigue   Complete by:  As directed    Call MD for:  hives   Complete by:  As directed    Call MD for:  persistant dizziness or light-headedness   Complete by:  As directed    Call MD for:  persistant nausea and vomiting   Complete by:  As directed    Call MD for:  redness, tenderness, or signs of infection (pain, swelling, redness, odor or green/yellow discharge around incision site)   Complete by:  As directed    Call MD for:  severe uncontrolled pain   Complete by:  As directed    Call MD for:  temperature >100.4   Complete by:  As directed    Diet - low sodium heart healthy   Complete by:  As directed    Increase activity slowly   Complete by:  As directed      Allergies as of 03/08/2018      Reactions   Levofloxacin Hives      Medication List    STOP taking these medications   bacitracin 500 UNIT/GM ointment   cyclobenzaprine 10 MG tablet Commonly known as:  FLEXERIL   diclofenac 50 MG EC tablet Commonly known as:  VOLTAREN   gabapentin 100 MG capsule Commonly known as:  NEURONTIN   indapamide 2.5 MG tablet Commonly known as:  LOZOL   lidocaine 5 % Commonly known as:  LIDODERM   loperamide 2 MG tablet Commonly known as:  IMODIUM A-D     TAKE these medications   amLODipine 10 MG tablet Commonly known as:  NORVASC Take 1 tablet (10 mg total) by mouth daily.   glucose blood test strip Use as instructed   glucose blood test strip Commonly known as:  ACCU-CHEK AVIVA PLUS Use as instructed   glucose blood test strip Commonly known as:  ACCU-CHEK SMARTVIEW Test blood sugar once daily   JANUMET 50-1000 MG tablet Generic drug:  sitaGLIPtin-metformin TAKE 1 TABLET BY MOUTH TWICE DAILY WITH MEALS What changed:    how much to take  how to take this  when to take this  Another medication with the same name was removed. Continue taking this medication, and follow the directions you see here.   lisinopril 40 MG  tablet Commonly known as:  PRINIVIL,ZESTRIL Take 1 tablet (40 mg total) by mouth daily. What changed:    medication strength  how much to take   onetouch ultrasoft lancets Use as instructed   traZODone 100 MG tablet Commonly known as:  DESYREL TAKE 1/2 TO 1 TABLET BY MOUTH EVERY NIGHT AT BEDTIME AS NEEDED FOR SLEEP      Follow-up Information    Bufford Lope, DO Follow up.   Specialty:  Family Medicine Why:  Hospital follow-up Contact information: [address] [place] [address] [phone]          Allergies  Allergen Reactions  . Levofloxacin Hives    Consultations:     Procedures/Studies: Ct Head Wo Contrast  Result Date: 03/07/2018 CLINICAL DATA:  Initial evaluation for intermittent right-sided numbness, tingling for 2 days. EXAM: CT HEAD WITHOUT CONTRAST TECHNIQUE: Contiguous axial images were obtained from the base of the skull through the vertex without intravenous contrast. COMPARISON:  None. FINDINGS: Brain: Cerebral volume within normal limits for age. Mild  chronic microvascular changes present within the periventricular white matter. Small remote right cerebellar infarct noted. No acute intracranial hemorrhage. No acute large vessel territory infarct. No mass lesion, midline shift or mass effect. No hydrocephalus. No extra-axial fluid collection. Vascular: No hyperdense vessel. Scattered vascular calcifications noted within the carotid siphons. Skull: Scalp soft tissues and calvarium within normal limits. Sinuses/Orbits: Globes and orbital soft tissues are normal. Paranasal sinuses and mastoid air cells are clear. Other: None. IMPRESSION: 1. No acute intracranial abnormality. 2. Small remote right cerebellar infarct. 3. Mild chronic small vessel ischemic changes. Electronically Signed   By: Jeannine Boga M.D.   On: 03/07/2018 05:08   Mr Brain Wo Contrast  Result Date: 03/07/2018 CLINICAL DATA:  TIA.  Right-sided numbness tingling and weakness.  EXAM: MRI HEAD WITHOUT CONTRAST TECHNIQUE: Multiplanar, multiecho pulse sequences of the brain and surrounding structures were obtained without intravenous contrast. COMPARISON:  CT head 03/07/2018 FINDINGS: Brain: Negative for acute infarct. Chronic infarct right cerebellum. Minimal chronic changes in the cerebral white matter. Negative for hemorrhage mass or edema. Negative for hydrocephalus. No midline shift. Vascular: Normal arterial flow voids Skull and upper cervical spine: Negative Sinuses/Orbits: Negative Other: None IMPRESSION: Negative for acute infarct. Chronic infarct right cerebellum. No acute abnormality. Electronically Signed   By: Franchot Gallo M.D.   On: 03/07/2018 10:25   Mm Screening Breast Tomo Bilateral  Result Date: 02/11/2018 CLINICAL DATA:  Screening. EXAM: DIGITAL SCREENING BILATERAL MAMMOGRAM WITH TOMO AND CAD COMPARISON:  Previous exam(s). ACR Breast Density Category c: The breast tissue is heterogeneously dense, which may obscure small masses. FINDINGS: There are no findings suspicious for malignancy. Images were processed with CAD. IMPRESSION: No mammographic evidence of malignancy. A result letter of this screening mammogram will be mailed directly to the patient. RECOMMENDATION: Screening mammogram in one year. (Code:SM-B-01Y) BI-RADS CATEGORY  1: Negative. Electronically Signed   By: Fidela Salisbury M.D.   On: 02/11/2018 13:52    Discharge Exam: Vitals:   03/08/18 0437 03/08/18 0900  BP: (!) 166/69 (!) 175/72  Pulse: 71 73  Resp: 17 17  Temp: 97.7 F (36.5 C) 97.9 F (36.6 C)  SpO2: 100% 100%   Vitals:   03/07/18 2125 03/08/18 0010 03/08/18 0437 03/08/18 0900  BP: (!) 152/60 (!) 176/77 (!) 166/69 (!) 175/72  Pulse: 71 78 71 73  Resp: 17 18 17 17   Temp: 98.4 F (36.9 C) 98.6 F (37 C) 97.7 F (36.5 C) 97.9 F (36.6 C)  TempSrc: Oral Oral Oral Oral  SpO2: 99% 97% 100% 100%  Weight:      Height:        General: Pt is alert, awake, not in acute  distress Cardiovascular: RRR, S1/S2 +, no rubs, no gallops Respiratory: CTA bilaterally, no wheezing, no rhonchi Abdominal: Soft, NT, ND, bowel sounds + Extremities: no edema, no cyanosis Neurology: Nonfocal, strength 5/5, sensation intact  The results of significant diagnostics from this hospitalization (including imaging, microbiology, ancillary and laboratory) are listed below for reference.     Microbiology: No results found for this or any previous visit (from the past 240 hour(s)).   Labs: BNP (last 3 results) No results for input(s): BNP in the last 8760 hours. Basic Metabolic Panel: Recent Labs  Lab 03/06/18 2241 03/06/18 2250  NA 141 140  K 3.9 3.7  CL 105 102  CO2 27  --   GLUCOSE 209* 208*  BUN 18 17  CREATININE 0.97 0.90  CALCIUM 9.5  --  Liver Function Tests: Recent Labs  Lab 03/06/18 2241  AST 23  ALT 14  ALKPHOS 40  BILITOT 0.1*  PROT 7.5  ALBUMIN 4.0   No results for input(s): LIPASE, AMYLASE in the last 168 hours. No results for input(s): AMMONIA in the last 168 hours. CBC: Recent Labs  Lab 03/06/18 2241 03/06/18 2250 03/07/18 1049  WBC 5.8  --   --   NEUTROABS 2.7  --   --   HGB 10.6* 11.2*  --   HCT 33.3* 33.0* 32.9*  MCV 91.0  --   --   PLT 310  --   --    Cardiac Enzymes: No results for input(s): CKTOTAL, CKMB, CKMBINDEX, TROPONINI in the last 168 hours. BNP: Invalid input(s): POCBNP CBG: Recent Labs  Lab 03/07/18 0602 03/08/18 0800  GLUCAP 115* 149*   D-Dimer No results for input(s): DDIMER in the last 72 hours. Hgb A1c No results for input(s): HGBA1C in the last 72 hours. Lipid Profile No results for input(s): CHOL, HDL, LDLCALC, TRIG, CHOLHDL, LDLDIRECT in the last 72 hours. Thyroid function studies No results for input(s): TSH, T4TOTAL, T3FREE, THYROIDAB in the last 72 hours.  Invalid input(s): FREET3 Anemia work up No results for input(s): VITAMINB12, FOLATE, FERRITIN, TIBC, IRON, RETICCTPCT in the last 72  hours. Urinalysis    Component Value Date/Time   COLORURINE YELLOW 09/20/2016 0012   APPEARANCEUR CLEAR 09/20/2016 0012   LABSPEC 1.031 (H) 09/20/2016 0012   PHURINE 5.0 09/20/2016 0012   GLUCOSEU >1000 (A) 09/20/2016 0012   HGBUR NEGATIVE 09/20/2016 0012   BILIRUBINUR NEGATIVE 09/20/2016 0012   BILIRUBINUR negative 07/17/2013 0946   KETONESUR NEGATIVE 09/20/2016 0012   PROTEINUR 30 (A) 09/20/2016 0012   UROBILINOGEN 1.0 07/17/2013 0946   UROBILINOGEN 1.0 05/27/2012 2055   NITRITE NEGATIVE 09/20/2016 0012   LEUKOCYTESUR NEGATIVE 09/20/2016 0012   Sepsis Labs Invalid input(s): PROCALCITONIN,  WBC,  LACTICIDVEN Microbiology No results found for this or any previous visit (from the past 240 hour(s)).   Time coordinating discharge: 32 minutes  SIGNED:  Chipper Oman, MD  [facility] 03/11/2018, 11:53 AM  Pager please text page via  [email or web address]  Note - This record has been created using [employer]. Chart creation errors have been sought, but may not always have been located. Such creation errors do not reflect on the standard of medical care.

## 2018-04-09 ENCOUNTER — Emergency Department (HOSPITAL_COMMUNITY)
Admission: EM | Admit: 2018-04-09 | Discharge: 2018-04-09 | Disposition: A | Discharge status: Home / Self Care | Payer: Medicare Other | Attending: Emergency Medicine | Admitting: Emergency Medicine

## 2018-04-09 ENCOUNTER — Encounter (HOSPITAL_COMMUNITY): Payer: Self-pay | Admitting: Emergency Medicine

## 2018-04-09 DIAGNOSIS — R110 Nausea: Secondary | ICD-10-CM | POA: Diagnosis not present

## 2018-04-09 DIAGNOSIS — I10 Essential (primary) hypertension: Secondary | ICD-10-CM | POA: Diagnosis not present

## 2018-04-09 DIAGNOSIS — E1165 Type 2 diabetes mellitus with hyperglycemia: Secondary | ICD-10-CM | POA: Insufficient documentation

## 2018-04-09 DIAGNOSIS — Z7984 Long term (current) use of oral hypoglycemic drugs: Secondary | ICD-10-CM | POA: Diagnosis not present

## 2018-04-09 DIAGNOSIS — R197 Diarrhea, unspecified: Secondary | ICD-10-CM | POA: Diagnosis not present

## 2018-04-09 DIAGNOSIS — R739 Hyperglycemia, unspecified: Secondary | ICD-10-CM

## 2018-04-09 DIAGNOSIS — E1140 Type 2 diabetes mellitus with diabetic neuropathy, unspecified: Secondary | ICD-10-CM | POA: Diagnosis not present

## 2018-04-09 DIAGNOSIS — R358 Other polyuria: Secondary | ICD-10-CM | POA: Insufficient documentation

## 2018-04-09 DIAGNOSIS — R631 Polydipsia: Secondary | ICD-10-CM | POA: Insufficient documentation

## 2018-04-09 HISTORY — DX: Pure hypercholesterolemia, unspecified: E78.00

## 2018-04-09 LAB — CBC
HCT: 32.6 % — ABNORMAL LOW (ref 36.0–46.0)
HEMOGLOBIN: 10.7 g/dL — AB (ref 12.0–15.0)
MCH: 29.4 pg (ref 26.0–34.0)
MCHC: 32.8 g/dL (ref 30.0–36.0)
MCV: 89.6 fL (ref 78.0–100.0)
PLATELETS: 287 10*3/uL (ref 150–400)
RBC: 3.64 MIL/uL — ABNORMAL LOW (ref 3.87–5.11)
RDW: 13.2 % (ref 11.5–15.5)
WBC: 6.6 10*3/uL (ref 4.0–10.5)

## 2018-04-09 LAB — URINALYSIS, ROUTINE W REFLEX MICROSCOPIC
Bilirubin Urine: NEGATIVE
Hgb urine dipstick: NEGATIVE
Ketones, ur: NEGATIVE mg/dL
Leukocytes, UA: NEGATIVE
NITRITE: NEGATIVE
PH: 5 (ref 5.0–8.0)
Protein, ur: 100 mg/dL — AB
SPECIFIC GRAVITY, URINE: 1.02 (ref 1.005–1.030)

## 2018-04-09 LAB — CBG MONITORING, ED
GLUCOSE-CAPILLARY: 345 mg/dL — AB (ref 65–99)
Glucose-Capillary: 222 mg/dL — ABNORMAL HIGH (ref 65–99)

## 2018-04-09 LAB — BASIC METABOLIC PANEL
ANION GAP: 10 (ref 5–15)
BUN: 28 mg/dL — ABNORMAL HIGH (ref 6–20)
CALCIUM: 9.7 mg/dL (ref 8.9–10.3)
CO2: 24 mmol/L (ref 22–32)
CREATININE: 1.18 mg/dL — AB (ref 0.44–1.00)
Chloride: 100 mmol/L — ABNORMAL LOW (ref 101–111)
GFR calc Af Amer: 54 mL/min — ABNORMAL LOW (ref >60)
GFR, EST NON AFRICAN AMERICAN: 47 mL/min — AB (ref >60)
Glucose, Bld: 272 mg/dL — ABNORMAL HIGH (ref 65–99)
Potassium: 4.5 mmol/L (ref 3.5–5.1)
Sodium: 134 mmol/L — ABNORMAL LOW (ref 135–145)

## 2018-04-09 MED ORDER — ONDANSETRON 4 MG PO TBDP
4.0000 mg | ORAL_TABLET | Freq: Once | ORAL | Status: AC
  Administered 2018-04-09: 4 mg via ORAL
  Filled 2018-04-09: qty 1

## 2018-04-09 NOTE — ED Triage Notes (Signed)
Pt reports she had her medications changed 2 Fridays ago and blood sugars have been running in 400-500s.

## 2018-04-09 NOTE — ED Provider Notes (Signed)
[facility] DEPT Provider Note   CSN: [id number] Arrival date & time: 04/09/18  1424     History   Chief Complaint Chief Complaint  Patient presents with  . Hyperglycemia  . Nausea    HPI Veronica Simmons is a 67 y.o. female with a hx of T2DM with neuropathy, HTN, and hyperlipidemia who presents to the ED with concern for hyperglycemia since medication change 04/10 by PCP. Patient states she has been on Janumet 50-1000 BID for several years. She was seen by PCP 04/10- she was having complaints of loose stools for > 1 year which the patient felt was related to the Metformin component of the Janumet. Patient's glucose was controlled on this medication, she had an A1C at this appointment that was 6.4. Given complaint patient's PCP DCed patient's Janumet and started her on Januvia BID. Patient states since this change she has had hyperglycemia with CBGs consistently in the 500-600 range. She has also had some polydipsia, polyuria, and nausea. She called her PCP yesterday afternoon who instructed DC Januvia and resume Janumet until follow up scheduled 05/08. Patient states she had a dose of Janumet last night- this AM woke up had another dose and ate oatmeal and fruit- CBG at that time was 386. She then ate a hot dog and french fries, she did not check her blood sugar but was concerned therefore she took another dose of Janumet (not due till this evening). Her daughter then brought her to the ER due to concern for blood glucose at 386 and subsequent consumption of a hot dog/fries. Patient has intermittent bloating/belching which is chronic and unchanged. Denies vomiting, diarrhea, abdominal pain, blood in stool, fever, chills, chest pain, dyspnea, or skin ulcerations.   HPI  Past Medical History:  Diagnosis Date  . ALLERGIC RHINITIS 02/19/2007   Qualifier: Diagnosis of  By: Lorne Skeens MD, VALENICA    . Anemia 1970s   on iron   . Arthritis   . Cubital tunnel  syndrome on right 10/31/2012   Secondary to old fracture. Evaluated by ortho. [facility] offered surgery but patient refused. Numbness and burning pain 4th and 5th digit.    . Diabetes mellitus 2000   Never on insulin   . High cholesterol   . Hypertension 2000    Patient Active Problem List   Diagnosis Date Noted  . Right sided weakness 03/07/2018  . HTN (hypertension) 03/07/2018  . Obesity (BMI 30-39.9) 03/07/2018  . Skin papules, generalized 08/04/2016  . Skin lesion of right lower limb 06/19/2016  . Leg swelling 05/12/2016  . Subconjunctival hemorrhage of right eye 04/27/2016  . Diarrhea 01/09/2016  . Health care maintenance 06/28/2015  . Anemia 12/21/2014  . Palpitations 01/07/2014  . Recurrent yeast vaginitis 11/05/2013  . Recurrent genital herpes 07/17/2013  . Hot flashes not due to menopause 11/27/2012  . Cubital tunnel syndrome on right 10/31/2012  . Type 2 diabetes, controlled, with neuropathy ([place]) 02/14/2007  . HYPERLIPIDEMIA 02/14/2007  . OBESITY, NOS 02/14/2007  . HYPERTENSION, BENIGN SYSTEMIC 02/14/2007  . ARTHRITIS 02/14/2007  . Insomnia 02/14/2007    History reviewed. No pertinent surgical history.   OB History   None      Home Medications    Prior to Admission medications   Medication Sig Start Date End Date Taking? Authorizing Provider  amLODipine (NORVASC) 10 MG tablet Take 1 tablet (10 mg total) by mouth daily. 07/28/16   Haney, Amedeo Plenty, MD  glucose blood (ACCU-CHEK AVIVA PLUS) test  strip Use as instructed 01/06/16   Marina Goodell A, MD  glucose blood (ACCU-CHEK SMARTVIEW) test strip Test blood sugar once daily 10/12/16   Haney, Alyssa A, MD  glucose blood test strip Use as instructed 12/29/15   Haney, Alyssa A, MD  JANUMET 50-1000 MG tablet TAKE 1 TABLET BY MOUTH TWICE DAILY WITH MEALS Patient taking differently: Taking 1 tab in PM only 07/14/16   Haney, Alyssa A, MD  Lancets (ONETOUCH ULTRASOFT) lancets Use as instructed 08/27/15   Haney,  Yetta Flock A, MD  lisinopril (PRINIVIL,ZESTRIL) 40 MG tablet Take 1 tablet (40 mg total) by mouth daily. 03/08/18   Patrecia Pour, Christean Grief, MD  traZODone (DESYREL) 100 MG tablet TAKE 1/2 TO 1 TABLET BY MOUTH EVERY NIGHT AT BEDTIME AS NEEDED FOR SLEEP 06/15/16   Veatrice Bourbon, MD    Family History Family History  Problem Relation Age of Onset  . Early death Mother 82       shot   . Heart disease Father 33  . Diabetes Daughter   . Kidney disease Daughter        on dialysis   . Diabetes Maternal Aunt   . Cancer Paternal Uncle        Breast Cancer   . Breast cancer Maternal Grandmother     Social History Social History   Tobacco Use  . Smoking status: Never Smoker  . Smokeless tobacco: Never Used  Substance Use Topics  . Alcohol use: No  . Drug use: No     Allergies   Levofloxacin   Review of Systems Review of Systems  Constitutional: Negative for chills and fever.  Respiratory: Negative for shortness of breath.   Cardiovascular: Negative for chest pain.  Gastrointestinal: Positive for nausea. Negative for abdominal pain, blood in stool, constipation, diarrhea and vomiting.       Positive for intermittent abdominal bloating/belching- chronic unchanged  Endocrine: Positive for polydipsia and polyuria.  Genitourinary: Negative for dysuria.  All other systems reviewed and are negative.   Physical Exam Updated Vital Signs BP 128/66 (BP Location: Left Arm)   Pulse 86   Temp 97.8 F (36.6 C)   Resp 18   Wt 94 kg (207 lb 3 oz)   SpO2 97%   BMI 34.48 kg/m   Physical Exam  Constitutional: She appears well-developed and well-nourished. No distress.  HENT:  Head: Normocephalic and atraumatic.  Eyes: Conjunctivae are normal. Right eye exhibits no discharge. Left eye exhibits no discharge.  Cardiovascular: Normal rate and regular rhythm.  No murmur heard. Pulses:      Dorsalis pedis pulses are 2+ on the right side, and 2+ on the left side.  Pulmonary/Chest: Breath sounds  normal. No respiratory distress. She has no wheezes. She has no rales.  Abdominal: Soft. She exhibits no distension. There is no tenderness. There is no rebound and no guarding.  Feet:  Right Foot:  Skin Integrity: Negative for ulcer, skin breakdown, erythema or warmth.  Left Foot:  Skin Integrity: Negative for ulcer, skin breakdown, erythema or warmth.  Neurological: She is alert.  Clear speech.   Skin: Skin is warm and dry. No rash noted.  Psychiatric: She has a normal mood and affect. Her behavior is normal.  Nursing note and vitals reviewed.   ED Treatments / Results  Labs Results for orders placed or performed during the hospital encounter of 29/92/42  Basic metabolic panel  Result Value Ref Range   Sodium 134 (L) 135 - 145 mmol/L  Potassium 4.5 3.5 - 5.1 mmol/L   Chloride 100 (L) 101 - 111 mmol/L   CO2 24 22 - 32 mmol/L   Glucose, Bld 272 (H) 65 - 99 mg/dL   BUN 28 (H) 6 - 20 mg/dL   Creatinine, Ser 1.18 (H) 0.44 - 1.00 mg/dL   Calcium 9.7 8.9 - 10.3 mg/dL   GFR calc non Af Amer 47 (L) >60 mL/min   GFR calc Af Amer 54 (L) >60 mL/min   Anion gap 10 5 - 15  CBC  Result Value Ref Range   WBC 6.6 4.0 - 10.5 K/uL   RBC 3.64 (L) 3.87 - 5.11 MIL/uL   Hemoglobin 10.7 (L) 12.0 - 15.0 g/dL   HCT 32.6 (L) 36.0 - 46.0 %   MCV 89.6 78.0 - 100.0 fL   MCH 29.4 26.0 - 34.0 pg   MCHC 32.8 30.0 - 36.0 g/dL   RDW 13.2 11.5 - 15.5 %   Platelets 287 150 - 400 K/uL  Urinalysis, Routine w reflex microscopic  Result Value Ref Range   Color, Urine AMBER (A) YELLOW   APPearance CLOUDY (A) CLEAR   Specific Gravity, Urine 1.020 1.005 - 1.030   pH 5.0 5.0 - 8.0   Glucose, UA >=500 (A) NEGATIVE mg/dL   Hgb urine dipstick NEGATIVE NEGATIVE   Bilirubin Urine NEGATIVE NEGATIVE   Ketones, ur NEGATIVE NEGATIVE mg/dL   Protein, ur 100 (A) NEGATIVE mg/dL   Nitrite NEGATIVE NEGATIVE   Leukocytes, UA NEGATIVE NEGATIVE   RBC / HPF 0-5 0 - 5 RBC/hpf   WBC, UA 0-5 0 - 5 WBC/hpf   Bacteria, UA  RARE (A) NONE SEEN   Squamous Epithelial / LPF 0-5 0 - 5   Mucus PRESENT    Hyaline Casts, UA PRESENT   CBG monitoring, ED  Result Value Ref Range   Glucose-Capillary 345 (H) 65 - 99 mg/dL  CBG monitoring, ED  Result Value Ref Range   Glucose-Capillary 222 (H) 65 - 99 mg/dL   No results found.  EKG None  Radiology No results found.  Procedures Procedures (including critical care time)  Medications Ordered in ED Medications  ondansetron (ZOFRAN-ODT) disintegrating tablet 4 mg (4 mg Oral Given 04/09/18 1720)    Initial Impression / Assessment and Plan / ED Course  I have reviewed the triage vital signs and the nursing notes.  Pertinent labs & imaging results that were available during my care of the patient were reviewed by me and considered in my medical decision making (see chart for details).   Patient presents to the ED with concern for hyperglycemia following medication change 2 weeks prior. Patient is nontoxic appearing, in no apparent distress, her vitals are WNL with the exception of elevated BP- do not suspect HTN emergency at this time, patient aware of need for recheck. Patient with benign physical exam. CBG initially 345 upon arrival, improved to 222 on my evaluation. Will evaluate with CBC, BMP, and UA.   Patient with hyperglycemia at 272. There is no anion gap, she is not acidotic, no ketonuria, do not suspect DKA at this time. Remaining labs notable for mild electrolyte abnormalities including sodium of 134, chloride of 100.  Hgb of 10.7 consistent with previous. Creatinine of 1.18 and BUN of 28, mildly increased from previous, will require recheck. UA with proteinuria. She has had proteinuria previously- she has seen nephrology for this with recent blood pressure medication adjustment. Suspect patient's hyperglycemia and subsequent symptoms to be related to her  recent medication change, her PCP has addressed this and instructed patient to resume her Janumet and DC her  Januvia. Given history it appears that since returning to her previous medication last evening her glucose has improved from the 500-600 range into the 200-300 range. Patient is tolerating PO in the ER. Plan for DC home with continued Janumet and PCP follow up. I discussed results, treatment plan, need for PCP follow-up, and return precautions with the patient. Provided opportunity for questions, patient confirmed understanding and is in agreement with plan.    Findings and plan of care discussed with supervising physician Dr. Jeanell Sparrow who is in agreement with plan.   Final Clinical Impressions(s) / ED Diagnoses   Final diagnoses:  Hyperglycemia    ED Discharge Orders    None       Amaryllis Dyke, PA-C 04/09/18 2303    Pattricia Boss, MD 04/09/18 [phone]

## 2018-04-09 NOTE — ED Notes (Addendum)
URINE SAMPLE REQUESTED,PATIENT WILL PROVIDE ONE ASAP

## 2018-04-09 NOTE — Discharge Instructions (Addendum)
Your seen in the emergency department today due to concern for nausea and high blood sugars.  Your blood sugar in the emergency department today was 272.  There was some protein in your urine and your creatinine (a measure of kidney function) was a bit elevated from previous lab work you have had done.  These are things that should be rechecked by your primary care provider.  Continue to take your prescribed medication as directed by your primary care doctor.  Specifically be sure to take your Janumet as prescribed.   Follow-up with your primary care provider within the next 2-3 days.  Return to the ER anytime for any new or worsening symptoms including but not limited to inability to keep fluids down, abdominal pain, fever, shortness of breath, chest pain, fever, or any other concerns that you may have.

## 2018-07-08 ENCOUNTER — Encounter: Payer: Self-pay | Admitting: Gastroenterology

## 2018-08-20 ENCOUNTER — Ambulatory Visit (AMBULATORY_SURGERY_CENTER): Payer: Self-pay

## 2018-08-20 VITALS — Ht 65.5 in | Wt 218.0 lb

## 2018-08-20 DIAGNOSIS — Z1211 Encounter for screening for malignant neoplasm of colon: Secondary | ICD-10-CM

## 2018-08-20 MED ORDER — NA SULFATE-K SULFATE-MG SULF 17.5-3.13-1.6 GM/177ML PO SOLN: 1.0000 | Freq: Once | ORAL | 0 refills | Status: AC

## 2018-08-20 NOTE — Progress Notes (Signed)
No egg or soy allergy known to patient  No issues with past sedation with any surgeries  or procedures, no intubation problems  No diet pills per patient No home 02 use per patient  No blood thinners per patient  Pt denies issues with constipation  No A fib or A flutter  EMMI video sent to pt's e mail the patient declines

## 2018-08-29 ENCOUNTER — Ambulatory Visit (AMBULATORY_SURGERY_CENTER): Payer: Medicare Other | Admitting: Gastroenterology

## 2018-08-29 ENCOUNTER — Encounter: Payer: Self-pay | Admitting: Gastroenterology

## 2018-08-29 VITALS — BP 151/77 | HR 67 | Temp 97.8°F | Resp 16 | Ht 65.5 in | Wt 218.0 lb

## 2018-08-29 DIAGNOSIS — Z1211 Encounter for screening for malignant neoplasm of colon: Secondary | ICD-10-CM

## 2018-08-29 DIAGNOSIS — IMO0002 Reserved for concepts with insufficient information to code with codable children: Secondary | ICD-10-CM

## 2018-08-29 DIAGNOSIS — K6389 Other specified diseases of intestine: Secondary | ICD-10-CM

## 2018-08-29 DIAGNOSIS — D122 Benign neoplasm of ascending colon: Secondary | ICD-10-CM

## 2018-08-29 MED ORDER — SODIUM CHLORIDE 0.9 % IV SOLN: 500.0000 mL | Freq: Once | INTRAVENOUS | Status: DC

## 2018-08-29 NOTE — Progress Notes (Signed)
Called to room to assist during endoscopic procedure.  Patient ID and intended procedure confirmed with present staff. Received instructions for my participation in the procedure from the performing physician.  

## 2018-08-29 NOTE — Patient Instructions (Signed)
*   handout on polyps and hemorrhoids given*  YOU HAD AN ENDOSCOPIC PROCEDURE TODAY AT [facility]:   Refer to the procedure report that was given to you for any specific questions about what was found during the examination.  If the procedure report does not answer your questions, please call your gastroenterologist to clarify.  If you requested that your care partner not be given the details of your procedure findings, then the procedure report has been included in a sealed envelope for you to review at your convenience later.  YOU SHOULD EXPECT: Some feelings of bloating in the abdomen. Passage of more gas than usual.  Walking can help get rid of the air that was put into your GI tract during the procedure and reduce the bloating. If you had a lower endoscopy (such as a colonoscopy or flexible sigmoidoscopy) you may notice spotting of blood in your stool or on the toilet paper. If you underwent a bowel prep for your procedure, you may not have a normal bowel movement for a few days.  Please Note:  You might notice some irritation and congestion in your nose or some drainage.  This is from the oxygen used during your procedure.  There is no need for concern and it should clear up in a day or so.  SYMPTOMS TO REPORT IMMEDIATELY:   Following lower endoscopy (colonoscopy or flexible sigmoidoscopy):  Excessive amounts of blood in the stool  Significant tenderness or worsening of abdominal pains  Swelling of the abdomen that is new, acute  Fever of 100F or higher   For urgent or emergent issues, a gastroenterologist can be reached at any hour by calling [phone].   DIET:  We do recommend a small meal at first, but then you may proceed to your regular diet.  Drink plenty of fluids but you should avoid alcoholic beverages for 24 hours.  ACTIVITY:  You should plan to take it easy for the rest of today and you should NOT DRIVE or use heavy machinery until tomorrow (because of  the sedation medicines used during the test).    FOLLOW UP: Our staff will call the number listed on your records the next business day following your procedure to check on you and address any questions or concerns that you may have regarding the information given to you following your procedure. If we do not reach you, we will leave a message.  However, if you are feeling well and you are not experiencing any problems, there is no need to return our call.  We will assume that you have returned to your regular daily activities without incident.  If any biopsies were taken you will be contacted by phone or by letter within the next 1-3 weeks.  Please call us at [phone] if you have not heard about the biopsies in 3 weeks.    SIGNATURES/CONFIDENTIALITY: You and/or your care partner have signed paperwork which will be entered into your electronic medical record.  These signatures attest to the fact that that the information above on your After Visit Summary has been reviewed and is understood.  Full responsibility of the confidentiality of this discharge information lies with you and/or your care-partner.

## 2018-08-29 NOTE — Progress Notes (Signed)
Report to PACU, RN, vss, BBS= Clear.  

## 2018-08-29 NOTE — Progress Notes (Signed)
Pt's states no medical or surgical changes since previsit or office visit. 

## 2018-08-29 NOTE — Op Note (Signed)
[facility] Patient Name: Veronica Simmons Procedure Date: 08/29/2018 8:31 AM MRN: [id number] Endoscopist: Justice Britain , MD Age: 67 Referring MD:  Date of Birth: 12-Feb-1951 Gender: Female Account #: [id number] Procedure:                Colonoscopy Indications:              Screening for malignant neoplasm in the colon Medicines:                Monitored Anesthesia Care Procedure:                Pre-Anesthesia Assessment:                           - Prior to the procedure, a History and Physical                            was performed, and patient medications and                            allergies were reviewed. The patient's tolerance of                            previous anesthesia was also reviewed. The risks                            and benefits of the procedure and the sedation                            options and risks were discussed with the patient.                            All questions were answered, and informed consent                            was obtained. Prior Anticoagulants: The patient has                            taken no previous anticoagulant or antiplatelet                            agents. ASA Grade Assessment: II - A patient with                            mild systemic disease. After reviewing the risks                            and benefits, the patient was deemed in                            satisfactory condition to undergo the procedure.                           After obtaining informed consent, the colonoscope  was passed under direct vision. Throughout the                            procedure, the patient's blood pressure, pulse, and                            oxygen saturations were monitored continuously. The                            Colonoscope was introduced through the anus and                            advanced to the the cecum, identified by                            appendiceal  orifice and ileocecal valve. The                            colonoscopy was somewhat difficult due to                            inadequate bowel prep, a redundant colon and                            significant looping. Successful completion of the                            procedure was aided by changing the patient to a                            supine position, straightening and shortening the                            scope to obtain bowel loop reduction, using scope                            torsion and applying abdominal pressure. Scope In: 8:40:54 AM Scope Out: 9:11:44 AM Scope Withdrawal Time: 0 hours 17 minutes 39 seconds  Total Procedure Duration: 0 hours 30 minutes 50 seconds  Findings:                 The digital rectal exam findings include                            non-thrombosed external hemorrhoids. Pertinent                            negatives include no palpable rectal lesions.                           A large amount of stool was found in the entire                            colon, interfering with visualization. Lavage of  the area was performed using copious amounts,                            resulting in incomplete clearance with fair                            visualization to see polyps at least 10 mm in size.                           One 8 mm submucosal nodule was found at the                            ileocecal valve. 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                           A 2 mm polyp was found in the ascending colon. The                            polyp was sessile. The polyp was removed with a                            jumbo cold forceps. Resection and retrieval were                            complete.                            Non-bleeding non-thrombosed external and internal                            hemorrhoids were found during perianal exam and                            during digital exam. The hemorrhoids were Grade I                            (internal hemorrhoids that do not prolapse). Complications:            No immediate complications. Estimated Blood Loss:     Estimated blood loss was minimal. Impression:               - Non-thrombosed external hemorrhoids found on                            digital rectal exam.                           -  Stool in the entire examined colon.                           - Submucosal nodule at the ileocecal valve.                            Biopsied.                           - One 2 mm polyp in the ascending colon, removed                            with a jumbo cold forceps. Resected and retrieved.                           - Non-bleeding non-thrombosed external and internal                            hemorrhoids. Recommendation:           - The patient will be observed post-procedure,                            until all discharge criteria are met.                           - Discharge patient to home.                           - Patient has a contact number available for                            emergencies. The signs and symptoms of potential                            delayed complications were discussed with the                            patient. Return to normal activities tomorrow.                            Written discharge instructions were provided to the                            patient.                           - Resume previous diet.                           - Continue present medications.                           - Await pathology results.                           - If negative for findings of lipoma, will  consider                            on repeat examination further biopsies vs CT.                           - Repeat colonoscopy in  1 year for screening                            purposes due to poor preparation. Would do clear                            liquids for 1 full day and use GoLytely preparation.                           - The findings and recommendations were discussed                            with the patient.                           - The findings and recommendations were discussed                            with the patient's family. Justice Britain, MD 08/29/2018 9:26:25 AM

## 2018-08-29 NOTE — Progress Notes (Signed)
Pt. Had 6 oz of water @ 0700, CRNA made aware and procedure postpone ,pt. And family made aware.

## 2018-08-30 ENCOUNTER — Telehealth: Payer: Self-pay

## 2018-08-30 NOTE — Telephone Encounter (Signed)
Pt call back is doing fine

## 2018-08-30 NOTE — Telephone Encounter (Signed)
Called [phone] and left a messaged we tried to reach pt for a follow up call. maw

## 2018-09-02 ENCOUNTER — Encounter: Payer: Self-pay | Admitting: Gastroenterology

## 2018-09-03 ENCOUNTER — Telehealth: Payer: Self-pay

## 2018-09-03 NOTE — Telephone Encounter (Signed)
Mansouraty, Telford Nab., MD  Timothy Lasso, RN; Delos Haring, RN        Mysti Haley, please place this patient on a recall list for 6-12 months for follow up colonoscopy (when I spoke with her after the procedure she wasn't interested in a follow up for a few months). She also needs a different preparation, so I would choose something different and ensure that she is on Miralax or another agent for at least 1-week prior to procedure on a daily basis to optimize her bowel habits. Thanks.    Recall in Epic with instructions for different prep and miralax 1 week prior   Letter mailed

## 2018-12-04 ENCOUNTER — Encounter: Payer: Self-pay | Admitting: Podiatry

## 2018-12-04 ENCOUNTER — Ambulatory Visit (INDEPENDENT_AMBULATORY_CARE_PROVIDER_SITE_OTHER): Payer: Medicare Other

## 2018-12-04 ENCOUNTER — Ambulatory Visit (INDEPENDENT_AMBULATORY_CARE_PROVIDER_SITE_OTHER): Payer: Medicare Other | Admitting: Podiatry

## 2018-12-04 VITALS — BP 141/74 | HR 90

## 2018-12-04 DIAGNOSIS — M79672 Pain in left foot: Secondary | ICD-10-CM

## 2018-12-04 DIAGNOSIS — M79671 Pain in right foot: Secondary | ICD-10-CM

## 2018-12-04 DIAGNOSIS — L97529 Non-pressure chronic ulcer of other part of left foot with unspecified severity: Secondary | ICD-10-CM | POA: Diagnosis not present

## 2018-12-04 DIAGNOSIS — E1142 Type 2 diabetes mellitus with diabetic polyneuropathy: Secondary | ICD-10-CM

## 2018-12-04 DIAGNOSIS — E0843 Diabetes mellitus due to underlying condition with diabetic autonomic (poly)neuropathy: Secondary | ICD-10-CM

## 2018-12-04 DIAGNOSIS — E11621 Type 2 diabetes mellitus with foot ulcer: Secondary | ICD-10-CM

## 2018-12-04 MED ORDER — PREGABALIN 150 MG PO CAPS: 150.0000 mg | ORAL_CAPSULE | Freq: Two times a day (BID) | ORAL | 1 refills | Status: DC

## 2018-12-06 ENCOUNTER — Telehealth: Payer: Self-pay | Admitting: Podiatry

## 2018-12-06 NOTE — Telephone Encounter (Signed)
Patient stated that medication is to strong and needed to get lower dosage.

## 2018-12-06 NOTE — Telephone Encounter (Signed)
I called pt and asked if she felt she could take the Lyrica only at night for one week and then begin taking the morning dose. Pt states she will try. I told pt I would informed Dr. Amalia Hailey of the change of therapy for a week.

## 2018-12-08 NOTE — Progress Notes (Signed)
   HPI: 67 year old female with PMHx of T2DM presenting today as a new patient with a chief complaint of pain to the bilateral feet secondary to peripheral neuropathy. She states she was diagnosed with neuropathy 5-10 years ago and it is getting worse. She has not had any treatment for her symptoms. There are no modifying factors noted. Patient is here for further evaluation and treatment.   Past Medical History:  Diagnosis Date  . ALLERGIC RHINITIS 02/19/2007   Qualifier: Diagnosis of  By: Lorne Skeens MD, VALENICA    . Anemia 1970s   on iron   . Arthritis   . Cubital tunnel syndrome on right 10/31/2012   Secondary to old fracture. Evaluated by ortho. [facility] offered surgery but patient refused. Numbness and burning pain 4th and 5th digit.    . Diabetes mellitus 2000   Never on insulin   . High cholesterol   . Hypertension 2000     Physical Exam: General: The patient is alert and oriented x3 in no acute distress.  Dermatology: Skin is warm, dry and supple bilateral lower extremities. Negative for open lesions or macerations.  Vascular: Palpable pedal pulses bilaterally. No edema or erythema noted. Capillary refill within normal limits.  Neurological: Epicritic and protective threshold diminished bilaterally.   Musculoskeletal Exam: Range of motion within normal limits to all pedal and ankle joints bilateral. Muscle strength 5/5 in all groups bilateral.   Radiographic Exam:  Normal osseous mineralization. Joint spaces preserved. No fracture/dislocation/boney destruction.    Assessment: 1. T2DM - uncontrolled 2. Peripheral neuropathy BLE   Plan of Care:  1. Patient evaluated. X-Rays reviewed.  2. Prescription for Lyrica 150 mg twice daily provided to patient.  3. Prescription for neuropathy pain cream to be dispensed by [facility].  4. Recommended good shoe gear.  5. Return to clinic in 3 months.      Edrick Kins, DPM [facility]  Dr.  Edrick Kins, DPM    [address], [place] [address]                Office [phone]  Fax [phone]

## 2018-12-09 ENCOUNTER — Telehealth: Payer: Self-pay | Admitting: Podiatry

## 2018-12-09 MED ORDER — NONFORMULARY OR COMPOUNDED ITEM: 5 refills | Status: DC

## 2018-12-09 NOTE — Telephone Encounter (Signed)
Pt cut back to 1 pill a day and is still experiencing a 'drunk' feeling. Pt would like to try cream mentioned from Doctor. Please give pt a call

## 2018-12-09 NOTE — Addendum Note (Signed)
Addended by: Harriett Sine D on: 12/09/2018 02:57 PM   Modules accepted: Orders

## 2018-12-09 NOTE — Telephone Encounter (Signed)
error 

## 2018-12-09 NOTE — Telephone Encounter (Signed)
I informed pt Dr. Amalia Hailey had sent the Peripheral Neuropathy Cream from [facility] [phone], they would call with coverage and delivery information. Faxed orders to [employer] [facility].

## 2018-12-18 HISTORY — PX: DG HAND RIGHT COMPLETE (ARMC HX): HXRAD1530

## 2019-01-28 ENCOUNTER — Other Ambulatory Visit: Payer: Self-pay | Admitting: Podiatry

## 2019-01-28 DIAGNOSIS — E11621 Type 2 diabetes mellitus with foot ulcer: Secondary | ICD-10-CM

## 2019-01-28 DIAGNOSIS — L97529 Non-pressure chronic ulcer of other part of left foot with unspecified severity: Principal | ICD-10-CM

## 2019-06-04 ENCOUNTER — Encounter: Payer: Self-pay | Admitting: Gastroenterology

## 2019-10-16 ENCOUNTER — Encounter (HOSPITAL_COMMUNITY): Payer: Self-pay | Admitting: Emergency Medicine

## 2019-10-16 ENCOUNTER — Other Ambulatory Visit: Payer: Self-pay

## 2019-10-16 ENCOUNTER — Observation Stay (HOSPITAL_COMMUNITY)
Admission: EM | Admit: 2019-10-16 | Discharge: 2019-10-18 | Disposition: A | Discharge status: Home / Self Care | Payer: Medicare Other | Attending: Internal Medicine | Admitting: Internal Medicine

## 2019-10-16 ENCOUNTER — Emergency Department (HOSPITAL_COMMUNITY): Payer: Medicare Other

## 2019-10-16 DIAGNOSIS — Z7984 Long term (current) use of oral hypoglycemic drugs: Secondary | ICD-10-CM | POA: Insufficient documentation

## 2019-10-16 DIAGNOSIS — I160 Hypertensive urgency: Secondary | ICD-10-CM | POA: Diagnosis present

## 2019-10-16 DIAGNOSIS — Z881 Allergy status to other antibiotic agents status: Secondary | ICD-10-CM | POA: Insufficient documentation

## 2019-10-16 DIAGNOSIS — M1990 Unspecified osteoarthritis, unspecified site: Secondary | ICD-10-CM | POA: Diagnosis not present

## 2019-10-16 DIAGNOSIS — I10 Essential (primary) hypertension: Secondary | ICD-10-CM | POA: Insufficient documentation

## 2019-10-16 DIAGNOSIS — E7800 Pure hypercholesterolemia, unspecified: Secondary | ICD-10-CM | POA: Insufficient documentation

## 2019-10-16 DIAGNOSIS — Z79899 Other long term (current) drug therapy: Secondary | ICD-10-CM | POA: Insufficient documentation

## 2019-10-16 DIAGNOSIS — I509 Heart failure, unspecified: Secondary | ICD-10-CM

## 2019-10-16 DIAGNOSIS — I110 Hypertensive heart disease with heart failure: Secondary | ICD-10-CM | POA: Insufficient documentation

## 2019-10-16 DIAGNOSIS — E1140 Type 2 diabetes mellitus with diabetic neuropathy, unspecified: Secondary | ICD-10-CM | POA: Diagnosis not present

## 2019-10-16 LAB — CBC WITH DIFFERENTIAL/PLATELET
Abs Immature Granulocytes: 0.02 10*3/uL (ref 0.00–0.07)
Basophils Absolute: 0 10*3/uL (ref 0.0–0.1)
Basophils Relative: 0 %
Eosinophils Absolute: 0.2 10*3/uL (ref 0.0–0.5)
Eosinophils Relative: 3 %
HCT: 34.5 % — ABNORMAL LOW (ref 36.0–46.0)
Hemoglobin: 10.5 g/dL — ABNORMAL LOW (ref 12.0–15.0)
Immature Granulocytes: 0 %
Lymphocytes Relative: 40 %
Lymphs Abs: 2.5 10*3/uL (ref 0.7–4.0)
MCH: 28.4 pg (ref 26.0–34.0)
MCHC: 30.4 g/dL (ref 30.0–36.0)
MCV: 93.2 fL (ref 80.0–100.0)
Monocytes Absolute: 0.3 10*3/uL (ref 0.1–1.0)
Monocytes Relative: 5 %
Neutro Abs: 3.1 10*3/uL (ref 1.7–7.7)
Neutrophils Relative %: 52 %
Platelets: 304 10*3/uL (ref 150–400)
RBC: 3.7 MIL/uL — ABNORMAL LOW (ref 3.87–5.11)
RDW: 14.1 % (ref 11.5–15.5)
WBC: 6.1 10*3/uL (ref 4.0–10.5)
nRBC: 0 % (ref 0.0–0.2)

## 2019-10-16 LAB — BASIC METABOLIC PANEL
Anion gap: 10 (ref 5–15)
BUN: 15 mg/dL (ref 8–23)
CO2: 25 mmol/L (ref 22–32)
Calcium: 9.4 mg/dL (ref 8.9–10.3)
Chloride: 105 mmol/L (ref 98–111)
Creatinine, Ser: 0.96 mg/dL (ref 0.44–1.00)
GFR calc Af Amer: >60 mL/min (ref >60)
GFR calc non Af Amer: >60 mL/min (ref >60)
Glucose, Bld: 89 mg/dL (ref 70–99)
Potassium: 3.7 mmol/L (ref 3.5–5.1)
Sodium: 140 mmol/L (ref 135–145)

## 2019-10-16 LAB — D-DIMER, QUANTITATIVE: D-Dimer, Quant: 1.05 ug/mL-FEU — ABNORMAL HIGH (ref 0.00–0.50)

## 2019-10-16 MED ORDER — IOHEXOL 350 MG/ML SOLN: 75.0000 mL | Freq: Once | INTRAVENOUS | Status: DC | PRN

## 2019-10-16 MED ORDER — CLONIDINE HCL 0.1 MG PO TABS
0.1000 mg | ORAL_TABLET | Freq: Once | ORAL | Status: AC
  Administered 2019-10-16: 0.1 mg via ORAL
  Filled 2019-10-16: qty 1

## 2019-10-16 MED ORDER — IOHEXOL 350 MG/ML SOLN
100.0000 mL | Freq: Once | INTRAVENOUS | Status: AC | PRN
  Administered 2019-10-16: 100 mL via INTRAVENOUS

## 2019-10-16 NOTE — ED Notes (Signed)
Per PA at clinic-states patient has been having SOB for 2 weeks-states last 2 days nasal congestion/runny nose-clinic did swab for Covid-sending to ED for further work up-couldn't get a good O2 read due to artifical nails

## 2019-10-16 NOTE — ED Notes (Signed)
Pt ambulatory without assistance or distress

## 2019-10-16 NOTE — ED Notes (Signed)
Ambulated pt in room and to RR while monitoring oxygen level. Pt's oxygen level maintained at or above 95% throughout the duration, although her respiration rate was noted to increase with exertion.

## 2019-10-16 NOTE — ED Provider Notes (Signed)
[facility] DEPT Provider Note   CSN: [id number] Arrival date & time: 10/16/19  1717     History   Chief Complaint Chief Complaint  Patient presents with  . Cough    HPI Veronica Simmons is a 68 y.o. female.     Patient is a 68 year old female who presents with a cough.  She states she has felt bad for about a week with some runny nose congestion and cough.  About 3 to 4 days ago she felt worse with worsening cough, sore throat and increased congestion.  She has diffuse myalgias.  Today she started having a sharp pain under her left breast.  Is been fairly constant since that time.  Its not worse with movement.  It is worse with deep inspiration and coughing.  She is also had some increased shortness of breath over the last 2 days.  She also has noticed that her blood pressures been elevated.  She is followed by nephrology who recently had changed her medications.  She was supposed to be taking amlodipine and losartan although she was unclear on this and was not taking both medications.  She recently started back taking both of the medications.  She was seen by her PCP today and they were not able to get a reliable pulse ox reportedly.  There was a report that she was possibly hypoxic although it was unclear whether this is accurate or related to her artificial nails.  She was sent over here for further evaluation.  She did have Covid testing done at her PCPs office today but has not yet future results.     Past Medical History:  Diagnosis Date  . ALLERGIC RHINITIS 02/19/2007   Qualifier: Diagnosis of  By: Lorne Skeens MD, VALENICA    . Anemia 1970s   on iron   . Arthritis   . Cubital tunnel syndrome on right 10/31/2012   Secondary to old fracture. Evaluated by ortho. [facility] offered surgery but patient refused. Numbness and burning pain 4th and 5th digit.    . Diabetes mellitus 2000   Never on insulin   . High cholesterol   . Hypertension  2000    Patient Active Problem List   Diagnosis Date Noted  . Right sided weakness 03/07/2018  . HTN (hypertension) 03/07/2018  . Obesity (BMI 30-39.9) 03/07/2018  . Skin papules, generalized 08/04/2016  . Skin lesion of right lower limb 06/19/2016  . Leg swelling 05/12/2016  . Subconjunctival hemorrhage of right eye 04/27/2016  . Diarrhea 01/09/2016  . Health care maintenance 06/28/2015  . Anemia 12/21/2014  . Palpitations 01/07/2014  . Recurrent yeast vaginitis 11/05/2013  . Recurrent genital herpes 07/17/2013  . Hot flashes not due to menopause 11/27/2012  . Cubital tunnel syndrome on right 10/31/2012  . 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 HYPERLIPIDEMIA 02/14/2007  . OBESITY, NOS 02/14/2007  . HYPERTENSION, BENIGN SYSTEMIC 02/14/2007  . ARTHRITIS 02/14/2007  . Insomnia 02/14/2007    History reviewed. No pertinent surgical history.   OB History   No obstetric history on file.      Home Medications    Prior to Admission medications   Medication Sig Start Date End Date Taking? Authorizing Provider  amLODipine (NORVASC) 10 MG tablet Take 1 tablet (10 mg total) by mouth daily. 07/28/16   Veatrice Bourbon, MD  BIOTIN PO Take by mouth.    [provider]  Coenzyme Q10 100 MG capsule Take by mouth.  [provider]  FOLIC ACID PO  03/23/26   [provider]  Ginger, Zingiber officinalis, 250 MG CAPS Take by mouth.    [provider]  glucose blood (ACCU-CHEK AVIVA PLUS) test strip Use as instructed 01/06/16   Marina Goodell A, MD  glucose blood (ACCU-CHEK SMARTVIEW) test strip Test blood sugar once daily 10/12/16   Haney, Alyssa A, MD  glucose blood test strip Use as instructed 12/29/15   Haney, Yetta Flock A, MD  hydrochlorothiazide (HYDRODIURIL) 25 MG tablet Take 25 mg by mouth daily. 03/21/18   [provider]  JANUMET 50-1000 MG tablet TAKE 1 TABLET BY MOUTH TWICE DAILY WITH MEALS Patient taking  differently: Taking 1 tab in PM only 07/14/16   Haney, Alyssa A, MD  Lancets (ONETOUCH ULTRASOFT) lancets Use as instructed 08/27/15   Haney, Alyssa A, MD  lisinopril (PRINIVIL,ZESTRIL) 40 MG tablet Take 1 tablet (40 mg total) by mouth daily. 03/08/18   Patrecia Pour, Christean Grief, MD  Melatonin 5 MG TABS  08/21/17   [provider]  Methylsulfonylmethane 1000 MG CAPS Take by mouth.    [provider]  Multiple Vitamins-Minerals (CENTRUM SILVER 50+WOMEN) TABS Take 1 tablet by mouth daily.    [provider]  NONFORMULARY OR COMPOUNDED ITEM [facility]:  Peripheral Neuropathy Cream - Bupivacaine 1%, Doxepin 3%, Gabapentin 6%, Pentoxifylline 3%, Topiramate 1%, apply 1-2 grams to affected are 3-4 times a day. 12/09/18   Edrick Kins, DPM  potassium bicarbonate (K-LYTE) 25 MEQ disintegrating tablet Take by mouth.    [provider]  pregabalin (LYRICA) 150 MG capsule Take 1 capsule (150 mg total) by mouth 2 (two) times daily. 12/04/18   Edrick Kins, DPM  traZODone (DESYREL) 100 MG tablet TAKE 1/2 TO 1 TABLET BY MOUTH EVERY NIGHT AT BEDTIME AS NEEDED FOR SLEEP 06/15/16   Haney, Amedeo Plenty, MD  TURMERIC PO Take by mouth.    [provider]  vitamin E 1000 UNIT capsule Take by mouth.    [provider]    Family History Family History  Problem Relation Age of Onset  . Early death Mother 8       shot   . Heart disease Father 63  . Diabetes Daughter   . Kidney disease Daughter        on dialysis   . Diabetes Maternal Aunt   . Cancer Paternal Uncle        Breast Cancer   . Breast cancer Maternal Grandmother   . Colon cancer Neg Hx   . Colon polyps Neg Hx   . Esophageal cancer Neg Hx   . Stomach cancer Neg Hx   . Rectal cancer Neg Hx     Social History Social History   Tobacco Use  . Smoking status: Never Smoker  . Smokeless tobacco: Never Used  Substance Use Topics  . Alcohol use: No  . Drug use: No     Allergies    Levofloxacin   Review of Systems Review of Systems  Constitutional: Positive for chills and fatigue. Negative for diaphoresis and fever.  HENT: Positive for congestion, postnasal drip, rhinorrhea and sore throat. Negative for sneezing.   Eyes: Negative.   Respiratory: Positive for cough and shortness of breath. Negative for chest tightness.   Cardiovascular: Positive for chest pain. Negative for leg swelling.  Gastrointestinal: Negative for abdominal pain, blood in stool, diarrhea, nausea and vomiting.  Genitourinary: Negative for difficulty urinating, flank pain, frequency and hematuria.  Musculoskeletal:  Positive for myalgias. Negative for arthralgias and back pain.  Skin: Negative for rash.  Neurological: Positive for headaches. Negative for dizziness, speech difficulty, weakness and numbness.     Physical Exam Updated Vital Signs BP (!) 229/108 (BP Location: Right Arm)   Pulse 78   Temp 98.5 F (36.9 C) (Oral)   Resp (!) 21   SpO2 99%   Physical Exam Constitutional:      Appearance: She is well-developed.  HENT:     Head: Normocephalic and atraumatic.  Eyes:     Pupils: Pupils are equal, round, and reactive to light.  Neck:     Musculoskeletal: Normal range of motion and neck supple.  Cardiovascular:     Rate and Rhythm: Normal rate and regular rhythm.     Heart sounds: Normal heart sounds.  Pulmonary:     Effort: Pulmonary effort is normal. No respiratory distress.     Breath sounds: Normal breath sounds. No wheezing or rales.  Chest:     Chest wall: Tenderness (There is some mild reproducibility of chest pain under her left breast, no crepitus or deformities noted, no rash) present.  Abdominal:     General: Bowel sounds are normal.     Palpations: Abdomen is soft.     Tenderness: There is no abdominal tenderness. There is no guarding or rebound.  Musculoskeletal: Normal range of motion.  Lymphadenopathy:     Cervical: No cervical adenopathy.  Skin:     General: Skin is warm and dry.     Findings: No rash.  Neurological:     Mental Status: She is alert and oriented to person, place, and time.      ED Treatments / Results  Labs (all labs ordered are listed, but only abnormal results are displayed) Labs Reviewed  CBC WITH DIFFERENTIAL/PLATELET - Abnormal; Notable for the following components:      Result Value   RBC 3.70 (*)    Hemoglobin 10.5 (*)    HCT 34.5 (*)    All other components within normal limits  D-DIMER, QUANTITATIVE (NOT AT [facility]) - Abnormal; Notable for the following components:   D-Dimer, Quant 1.05 (*)    All other components within normal limits  BASIC METABOLIC PANEL    EKG EKG Interpretation  Date/Time:  Thursday October 16 2019 20:57:49 EDT Ventricular Rate:  78 PR Interval:    QRS Duration: 95 QT Interval:  405 QTC Calculation: 462 R Axis:   56 Text Interpretation: Sinus rhythm since last tracing no significant change Confirmed by Malvin Johns [phone]) on 10/16/2019 9:05:33 PM   Radiology Dg Chest 2 View  Result Date: 10/16/2019 CLINICAL DATA:  Cough and sore throat for 3 days. EXAM: CHEST - 2 VIEW COMPARISON:  05/27/2012 FINDINGS: Heart size is at the upper limits of normal and stable. Aortic atherosclerosis. Both lungs are clear. No evidence of pleural effusion. IMPRESSION: Stable exam. No active cardiopulmonary disease. Electronically Signed   By: Marlaine Hind M.D.   On: 10/16/2019 18:06    Procedures Procedures (including critical care time)  Medications Ordered in ED Medications  cloNIDine (CATAPRES) tablet 0.1 mg (0.1 mg Oral Given 10/16/19 2102)     Initial Impression / Assessment and Plan / ED Course  I have reviewed the triage vital signs and the nursing notes.  Pertinent labs & imaging results that were available during my care of the patient were reviewed by me and considered in my medical decision making (see chart for details).  Patient presents with cough and viral  symptoms.  She is afebrile.  She has normal oxygen saturation.  She had a [place] test earlier today which is pending.  Her blood pressure is markedly elevated and she was given a dose of clonidine in the ED.  Her labs show an elevated D-dimer which was ordered due to her left-sided chest pain.  She is awaiting a CT angio of her chest. Antonietta Breach, PA-C to take over pending CT.    Final Clinical Impressions(s) / ED Diagnoses   Final diagnoses:  None    ED Discharge Orders    None       Malvin Johns, MD 10/16/19 2208

## 2019-10-16 NOTE — ED Triage Notes (Signed)
Patient reports cough and sore throat x3 days. Seen at PCP for same today. Denies N/V/D and fever. Ambulatory. Reports recent change in blood pressure medications.

## 2019-10-17 ENCOUNTER — Other Ambulatory Visit: Payer: Self-pay

## 2019-10-17 ENCOUNTER — Encounter (HOSPITAL_COMMUNITY): Payer: Self-pay | Admitting: *Deleted

## 2019-10-17 ENCOUNTER — Observation Stay (HOSPITAL_BASED_OUTPATIENT_CLINIC_OR_DEPARTMENT_OTHER): Payer: Medicare Other

## 2019-10-17 DIAGNOSIS — I10 Essential (primary) hypertension: Secondary | ICD-10-CM

## 2019-10-17 DIAGNOSIS — E1140 Type 2 diabetes mellitus with diabetic neuropathy, unspecified: Secondary | ICD-10-CM | POA: Diagnosis not present

## 2019-10-17 DIAGNOSIS — I160 Hypertensive urgency: Secondary | ICD-10-CM | POA: Diagnosis present

## 2019-10-17 DIAGNOSIS — I340 Nonrheumatic mitral (valve) insufficiency: Secondary | ICD-10-CM | POA: Diagnosis not present

## 2019-10-17 DIAGNOSIS — I509 Heart failure, unspecified: Secondary | ICD-10-CM | POA: Diagnosis not present

## 2019-10-17 LAB — HEMOGLOBIN A1C
Hgb A1c MFr Bld: 6.7 % — ABNORMAL HIGH (ref 4.8–5.6)
Mean Plasma Glucose: 145.59 mg/dL

## 2019-10-17 LAB — SARS CORONAVIRUS 2 (TAT 6-24 HRS): SARS Coronavirus 2: NEGATIVE

## 2019-10-17 LAB — TROPONIN I (HIGH SENSITIVITY)
Troponin I (High Sensitivity): 17 ng/L (ref <18)
Troponin I (High Sensitivity): 15 ng/L (ref <18)

## 2019-10-17 LAB — ECHOCARDIOGRAM COMPLETE
Height: 65.512 in
Weight: 3541.47 oz

## 2019-10-17 LAB — GLUCOSE, CAPILLARY
Glucose-Capillary: 120 mg/dL — ABNORMAL HIGH (ref 70–99)
Glucose-Capillary: 146 mg/dL — ABNORMAL HIGH (ref 70–99)
Glucose-Capillary: 151 mg/dL — ABNORMAL HIGH (ref 70–99)
Glucose-Capillary: 181 mg/dL — ABNORMAL HIGH (ref 70–99)

## 2019-10-17 LAB — BRAIN NATRIURETIC PEPTIDE: B Natriuretic Peptide: 215.4 pg/mL — ABNORMAL HIGH (ref 0.0–100.0)

## 2019-10-17 LAB — HIV ANTIBODY (ROUTINE TESTING W REFLEX): HIV Screen 4th Generation wRfx: NONREACTIVE

## 2019-10-17 MED ORDER — FUROSEMIDE 10 MG/ML IJ SOLN: 40.0000 mg | Freq: Every day | INTRAMUSCULAR | Status: DC

## 2019-10-17 MED ORDER — INSULIN ASPART 100 UNIT/ML ~~LOC~~ SOLN
0.0000 [IU] | Freq: Three times a day (TID) | SUBCUTANEOUS | Status: DC
  Administered 2019-10-17: 3 [IU] via SUBCUTANEOUS
  Administered 2019-10-17: 2 [IU] via SUBCUTANEOUS
  Administered 2019-10-18: 3 [IU] via SUBCUTANEOUS
  Administered 2019-10-18: 5 [IU] via SUBCUTANEOUS
  Filled 2019-10-17: qty 0.15

## 2019-10-17 MED ORDER — ENOXAPARIN SODIUM 40 MG/0.4ML ~~LOC~~ SOLN
40.0000 mg | Freq: Every day | SUBCUTANEOUS | Status: DC
  Administered 2019-10-17 – 2019-10-18 (×2): 40 mg via SUBCUTANEOUS
  Filled 2019-10-17 (×2): qty 0.4

## 2019-10-17 MED ORDER — FUROSEMIDE 10 MG/ML IJ SOLN
40.0000 mg | Freq: Two times a day (BID) | INTRAMUSCULAR | Status: AC
  Administered 2019-10-18 (×2): 40 mg via INTRAVENOUS
  Filled 2019-10-17 (×2): qty 4

## 2019-10-17 MED ORDER — ACETAMINOPHEN 325 MG PO TABS
650.0000 mg | ORAL_TABLET | ORAL | Status: DC | PRN
  Administered 2019-10-17 – 2019-10-18 (×2): 650 mg via ORAL
  Filled 2019-10-17 (×2): qty 2

## 2019-10-17 MED ORDER — ONDANSETRON HCL 4 MG/2ML IJ SOLN
4.0000 mg | Freq: Four times a day (QID) | INTRAMUSCULAR | Status: DC | PRN
  Administered 2019-10-17: 4 mg via INTRAVENOUS
  Filled 2019-10-17: qty 2

## 2019-10-17 MED ORDER — SODIUM CHLORIDE 0.9% FLUSH
3.0000 mL | INTRAVENOUS | Status: DC | PRN
  Administered 2019-10-17: 3 mL via INTRAVENOUS
  Filled 2019-10-17: qty 3

## 2019-10-17 MED ORDER — ISOSORB DINITRATE-HYDRALAZINE 20-37.5 MG PO TABS
1.0000 | ORAL_TABLET | Freq: Three times a day (TID) | ORAL | Status: DC
  Administered 2019-10-17 – 2019-10-18 (×3): 1 via ORAL
  Filled 2019-10-17 (×5): qty 1

## 2019-10-17 MED ORDER — LABETALOL HCL 5 MG/ML IV SOLN: 20.0000 mg | INTRAVENOUS | Status: DC | PRN

## 2019-10-17 MED ORDER — LOSARTAN POTASSIUM 50 MG PO TABS
100.0000 mg | ORAL_TABLET | Freq: Every day | ORAL | Status: DC
  Administered 2019-10-17: 100 mg via ORAL
  Filled 2019-10-17: qty 2

## 2019-10-17 MED ORDER — AMLODIPINE BESYLATE 10 MG PO TABS
10.0000 mg | ORAL_TABLET | Freq: Every day | ORAL | Status: DC
  Administered 2019-10-17 – 2019-10-18 (×2): 10 mg via ORAL
  Filled 2019-10-17 (×2): qty 1

## 2019-10-17 MED ORDER — SODIUM CHLORIDE 0.9% FLUSH
3.0000 mL | Freq: Two times a day (BID) | INTRAVENOUS | Status: DC
  Administered 2019-10-17 – 2019-10-18 (×3): 3 mL via INTRAVENOUS

## 2019-10-17 MED ORDER — LABETALOL HCL 5 MG/ML IV SOLN
10.0000 mg | INTRAVENOUS | Status: DC | PRN
  Administered 2019-10-18: 10 mg via INTRAVENOUS
  Filled 2019-10-17 (×4): qty 4

## 2019-10-17 MED ORDER — LABETALOL HCL 5 MG/ML IV SOLN
10.0000 mg | Freq: Once | INTRAVENOUS | Status: AC
  Administered 2019-10-17: 10 mg via INTRAVENOUS
  Filled 2019-10-17: qty 4

## 2019-10-17 MED ORDER — FUROSEMIDE 10 MG/ML IJ SOLN
40.0000 mg | Freq: Once | INTRAMUSCULAR | Status: AC
  Administered 2019-10-17: 40 mg via INTRAVENOUS
  Filled 2019-10-17: qty 4

## 2019-10-17 MED ORDER — PNEUMOCOCCAL VAC POLYVALENT 25 MCG/0.5ML IJ INJ
0.5000 mL | INJECTION | INTRAMUSCULAR | Status: DC
  Filled 2019-10-17: qty 0.5

## 2019-10-17 MED ORDER — LISINOPRIL 20 MG PO TABS
40.0000 mg | ORAL_TABLET | Freq: Every day | ORAL | Status: DC
  Administered 2019-10-17 – 2019-10-18 (×2): 40 mg via ORAL
  Filled 2019-10-17 (×2): qty 2

## 2019-10-17 MED ORDER — INSULIN ASPART 100 UNIT/ML ~~LOC~~ SOLN
0.0000 [IU] | Freq: Every day | SUBCUTANEOUS | Status: DC
  Filled 2019-10-17: qty 0.05

## 2019-10-17 MED ORDER — SODIUM CHLORIDE 0.9 % IV SOLN: 250.0000 mL | INTRAVENOUS | Status: DC | PRN

## 2019-10-17 NOTE — Progress Notes (Signed)
Patient was admitted earlier today with shortness of breath and concerns for possible congestive heart failure.  Work-up recent progress.  Echocardiogram result is pending.  We will check cardiac BNP.  Hypertensive urgency noted on presentation.  We discontinue losartan.  Will start patient on lisinopril 40 Mg p.o. once daily.  Will start patient on BiDil.  Will cautiously diurese patient.  Further management will depend on hospital course.  Likely, patient will be discharged back home tomorrow.

## 2019-10-17 NOTE — ED Notes (Signed)
Pt placed on pur wick

## 2019-10-17 NOTE — ED Provider Notes (Signed)
12:24 AM Patient care assumed at shift change from MD [facility].  In short, patient complaining of upper respiratory symptoms including runny nose and cough with increased congestion.  Patient states that she has been experiencing shortness of breath over the past 2 days.  Shortness of breath is aggravated with exertion.  She is significantly hypertensive compared to her most recent outpatient PCP visit earlier this month.  She has been compliant with her daily blood pressure medications x3 weeks.  Took her amlodipine and lisinopril this morning.  The patient was given oral clonidine without much change to her blood pressure in the ED.  CT PE study is without evidence of pulmonary embolus.  There is mild evidence of edema, but patient does not appear clinically fluid overloaded.  I am concerned that her dyspnea on exertion may be related to an anginal equivalent given her hypertension or a degree of hypertensive urgency/emergency.  She has been able to ambulate in the ED without hypoxia.  Sats remain at or above 95%.  Will add troponin.  Medications to be given for BP control.  1:40 AM Patient with troponin of 17. Her BP remains elevated at 161 systolic. Will give additional 10mg  IV labetalol.  Plan for admission to hospitalist service for management of hypertensive urgency.  Vitals:   10/17/19 0013 10/17/19 0100 10/17/19 0101 10/17/19 0134  BP: (!) 218/91 (!) 214/116 (!) 200/99 (!) 211/88  Pulse: 77 76 85 78  Resp: 16 (!) 21 19 19   Temp:      TempSrc:      SpO2: 99% 95% 98% 95%    CRITICAL CARE Performed by: Antonietta Breach   Total critical care time: 45 minutes  Critical care time was exclusive of separately billable procedures and treating other patients.  Critical care was necessary to treat or prevent imminent or life-threatening deterioration.  Critical care was time spent personally by me on the following activities: development of treatment plan with patient and/or surrogate as well as  nursing, discussions with consultants, evaluation of patient's response to treatment, examination of patient, obtaining history from patient or surrogate, ordering and performing treatments and interventions, ordering and review of laboratory studies, ordering and review of radiographic studies, pulse oximetry and re-evaluation of patient's condition.    Antonietta Breach, PA-C 10/17/19 0216    Shanon Rosser, MD 10/17/19 [phone]

## 2019-10-17 NOTE — Plan of Care (Signed)

## 2019-10-17 NOTE — H&P (Signed)
History and Physical    Veronica Simmons CHE:[id number] DOB: 10-Feb-1951 DOA: 10/16/2019  PCP: Katherina Mires, MD  Patient coming from: Home  I have personally briefly reviewed patient's old medical records in [place]  Chief Complaint: SOB  HPI: Veronica Simmons is a 68 y.o. female with medical history significant of DM2, HTN.  Patient presents to the ED with c/o SOB and DOE, especially over the past 2 days.  Also has URI symptoms.  Took only nyquil at home (no other OTC meds or decongestants).  SOB worse with exertion.  Presents to ED.  No fever.  Legs swell up occasionally.  On amlodipine and losartan for BP, took meds yesterday.  ED Course: SBPs consistently over 536 systolic in the 144-315 range during ED course.  Given 0.1mg  clonidine and 10mg  IV labetalol without much apparent effect.  No SIRS.  CT chest shows mild pulm edema.  Trop neg.   Review of Systems: As per HPI, otherwise all review of systems negative.  Past Medical History:  Diagnosis Date  . ALLERGIC RHINITIS 02/19/2007   Qualifier: Diagnosis of  By: Lorne Skeens MD, VALENICA    . Anemia 1970s   on iron   . Arthritis   . Cubital tunnel syndrome on right 10/31/2012   Secondary to old fracture. Evaluated by ortho. [facility] offered surgery but patient refused. Numbness and burning pain 4th and 5th digit.    . Diabetes mellitus 2000   Never on insulin   . High cholesterol   . Hypertension 2000    History reviewed. No pertinent surgical history.   reports that she has never smoked. She has never used smokeless tobacco. She reports that she does not drink alcohol or use drugs.  Allergies  Allergen Reactions  . Levofloxacin Hives    Family History  Problem Relation Age of Onset  . Early death Mother 39       shot   . Heart disease Father 74  . Diabetes Daughter   . Kidney disease Daughter        on dialysis   . Diabetes Maternal Aunt   . Cancer Paternal Uncle        Breast  Cancer   . Breast cancer Maternal Grandmother   . Colon cancer Neg Hx   . Colon polyps Neg Hx   . Esophageal cancer Neg Hx   . Stomach cancer Neg Hx   . Rectal cancer Neg Hx      Prior to Admission medications   Medication Sig Start Date End Date Taking? Authorizing Provider  amLODipine (NORVASC) 10 MG tablet Take 1 tablet (10 mg total) by mouth daily. 07/28/16  Yes Haney, Alyssa A, MD  JANUMET 50-1000 MG tablet TAKE 1 TABLET BY MOUTH TWICE DAILY WITH MEALS Patient taking differently: Take 1 tablet by mouth daily.  07/14/16  Yes Haney, Alyssa A, MD  losartan (COZAAR) 100 MG tablet Take 100 mg by mouth daily.   Yes [provider]  BIOTIN PO Take by mouth.    [provider]  glucose blood (ACCU-CHEK AVIVA PLUS) test strip Use as instructed 01/06/16   Marina Goodell A, MD  glucose blood (ACCU-CHEK SMARTVIEW) test strip Test blood sugar once daily 10/12/16   Haney, Alyssa A, MD  glucose blood test strip Use as instructed 12/29/15   Marina Goodell A, MD  Lancets (ONETOUCH ULTRASOFT) lancets Use as instructed 08/27/15   Haney, Yetta Flock A, MD  lisinopril (PRINIVIL,ZESTRIL) 40 MG tablet Take 1  tablet (40 mg total) by mouth daily. Patient not taking: Reported on 10/17/2019 03/08/18   Patrecia Pour, Christean Grief, MD  Melatonin 5 MG TABS  08/21/17   [provider]  Methylsulfonylmethane 1000 MG CAPS Take by mouth.    [provider]  Multiple Vitamins-Minerals (CENTRUM SILVER 50+WOMEN) TABS Take 1 tablet by mouth daily.    [provider]  NONFORMULARY OR COMPOUNDED ITEM [facility]:  Peripheral Neuropathy Cream - Bupivacaine 1%, Doxepin 3%, Gabapentin 6%, Pentoxifylline 3%, Topiramate 1%, apply 1-2 grams to affected are 3-4 times a day. 12/09/18   Edrick Kins, DPM  potassium bicarbonate (K-LYTE) 25 MEQ disintegrating tablet Take by mouth.    [provider]  pregabalin (LYRICA) 150 MG capsule Take 1 capsule (150 mg total) by mouth 2 (two) times daily.  12/04/18   Edrick Kins, DPM  traZODone (DESYREL) 100 MG tablet TAKE 1/2 TO 1 TABLET BY MOUTH EVERY NIGHT AT BEDTIME AS NEEDED FOR SLEEP Patient taking differently: Take 200 mg by mouth at bedtime.  06/15/16   Veatrice Bourbon, MD  TURMERIC PO Take by mouth.    [provider]  vitamin E 1000 UNIT capsule Take by mouth.    [provider]    Physical Exam: Vitals:   10/17/19 0013 10/17/19 0100 10/17/19 0101 10/17/19 0134  BP: (!) 218/91 (!) 214/116 (!) 200/99 (!) 211/88  Pulse: 77 76 85 78  Resp: 16 (!) 21 19 19   Temp:      TempSrc:      SpO2: 99% 95% 98% 95%    Constitutional: NAD, calm, comfortable Eyes: PERRL, lids and conjunctivae normal ENMT: Mucous membranes are moist. Posterior pharynx clear of any exudate or lesions.Normal dentition.  Neck: normal, supple, no masses, no thyromegaly Respiratory: clear to auscultation bilaterally, no wheezing, no crackles. Normal respiratory effort. No accessory muscle use.  Cardiovascular: Regular rate and rhythm, no murmurs / rubs / gallops. No extremity edema. 2+ pedal pulses. No carotid bruits.  Abdomen: no tenderness, no masses palpated. No hepatosplenomegaly. Bowel sounds positive.  Musculoskeletal: no clubbing / cyanosis. No joint deformity upper and lower extremities. Good ROM, no contractures. Normal muscle tone.  Skin: no rashes, lesions, ulcers. No induration Neurologic: CN 2-12 grossly intact. Sensation intact, DTR normal. Strength 5/5 in all 4.  Psychiatric: Normal judgment and insight. Alert and oriented x 3. Normal mood.    Labs on Admission: I have personally reviewed following labs and imaging studies  CBC: Recent Labs  Lab 10/16/19 2120  WBC 6.1  NEUTROABS 3.1  HGB 10.5*  HCT 34.5*  MCV 93.2  PLT 540   Basic Metabolic Panel: Recent Labs  Lab 10/16/19 2120  NA 140  K 3.7  CL 105  CO2 25  GLUCOSE 89  BUN 15  CREATININE 0.96  CALCIUM 9.4   GFR: CrCl cannot be calculated (Unknown ideal  weight.). Liver Function Tests: No results for input(s): AST, ALT, ALKPHOS, BILITOT, PROT, ALBUMIN in the last 168 hours. No results for input(s): LIPASE, AMYLASE in the last 168 hours. No results for input(s): AMMONIA in the last 168 hours. Coagulation Profile: No results for input(s): INR, PROTIME in the last 168 hours. Cardiac Enzymes: No results for input(s): CKTOTAL, CKMB, CKMBINDEX, TROPONINI in the last 168 hours. BNP (last 3 results) No results for input(s): PROBNP in the last 8760 hours. HbA1C: No results for input(s): HGBA1C in the last 72 hours. CBG: No results for input(s): GLUCAP in the last 168 hours. Lipid  Profile: No results for input(s): CHOL, HDL, LDLCALC, TRIG, CHOLHDL, LDLDIRECT in the last 72 hours. Thyroid Function Tests: No results for input(s): TSH, T4TOTAL, FREET4, T3FREE, THYROIDAB in the last 72 hours. Anemia Panel: No results for input(s): VITAMINB12, FOLATE, FERRITIN, TIBC, IRON, RETICCTPCT in the last 72 hours. Urine analysis:    Component Value Date/Time   COLORURINE AMBER (A) 04/09/2018 2211   APPEARANCEUR CLOUDY (A) 04/09/2018 2211   LABSPEC 1.020 04/09/2018 2211   PHURINE 5.0 04/09/2018 2211   GLUCOSEU >=500 (A) 04/09/2018 2211   HGBUR NEGATIVE 04/09/2018 2211   BILIRUBINUR NEGATIVE 04/09/2018 2211   BILIRUBINUR negative 07/17/2013 [place] 04/09/2018 2211   PROTEINUR 100 (A) 04/09/2018 2211   UROBILINOGEN 1.0 07/17/2013 0946   UROBILINOGEN 1.0 05/27/2012 2055   NITRITE NEGATIVE 04/09/2018 2211   LEUKOCYTESUR NEGATIVE 04/09/2018 2211    Radiological Exams on Admission: Dg Chest 2 View  Result Date: 10/16/2019 CLINICAL DATA:  Cough and sore throat for 3 days. EXAM: CHEST - 2 VIEW COMPARISON:  05/27/2012 FINDINGS: Heart size is at the upper limits of normal and stable. Aortic atherosclerosis. Both lungs are clear. No evidence of pleural effusion. IMPRESSION: Stable exam. No active cardiopulmonary disease. Electronically  Signed   By: Marlaine Hind M.D.   On: 10/16/2019 18:06   Ct Angio Chest Pe W/cm &/or Wo Cm  Result Date: 10/16/2019 CLINICAL DATA:  PE suspected, positive D-dimer, 2 weeks of shortness of breath EXAM: CT ANGIOGRAPHY CHEST WITH CONTRAST TECHNIQUE: Multidetector CT imaging of the chest was performed using the standard protocol during bolus administration of intravenous contrast. Multiplanar CT image reconstructions and MIPs were obtained to evaluate the vascular anatomy. CONTRAST:  113mL OMNIPAQUE IOHEXOL 350 MG/ML SOLN COMPARISON:  Chest radiograph same day FINDINGS: Cardiovascular: Satisfactory opacification the pulmonary arteries to the segmental level with respiratory motion artifact most pronounced in the bases limiting evaluation beyond the lobar low. No central or lobar pulmonary artery filling defects are identified. Central pulmonary arteries are normal caliber. No elevation of the RV/LV ratio (0.7). Atherosclerotic plaque throughout the nonaneurysmal thoracic aorta. Normal 3 vessel branching of the arch with minimal plaque in the proximal great vessels which are otherwise unremarkable. Normal heart size. No pericardial effusion. Atherosclerotic calcification of the coronary arteries. Mediastinum/Nodes: Scattered low-attenuation nodes throughout the mediastinum and bilateral hila, largest a 9 mm pretracheal lymph node (4/32). No axillary adenopathy. No acute abnormality of the trachea or esophagus. Lungs/Pleura: Mosaic attenuation in the lungs likely reflective of imaging during exhalation with some additional bandlike areas of atelectasis in the lower lungs. There is a small right pleural effusion. Interlobular septal thickening is noted in both lung apices and lung bases. Mild peribronchovascular thickening is noted as well. Upper Abdomen: No acute abnormalities present in the visualized portions of the upper abdomen. Musculoskeletal: No acute osseous abnormality or suspicious osseous lesion. Dense  calcification of the costal cartilages. Multilevel degenerative changes are present in the imaged portions of the spine. No suspicious chest wall lesions. Benign macrocalcification in the left breast. Review of the MIP images confirms the above findings. IMPRESSION: 1. No evidence of pulmonary embolism to the segmental level. Evaluation beyond the segmental level is limited due to respiratory motion artifact. 2. Interlobular septal thickening in both lung apices and lung bases, suggestive of mild pulmonary edema. Small right pleural effusion. 3. Scattered low-attenuation nodes throughout the mediastinum and bilateral hila, nonspecific, but favored to be reactive. 4. Aortic Atherosclerosis (ICD10-I70.0). Electronically Signed   By: March Rummage  [facility] M.D.   On: 10/16/2019 23:12    EKG: Independently reviewed.  Assessment/Plan Principal Problem:   Hypertensive urgency Active Problems:   Type 2 diabetes, controlled, with neuropathy (HCC)   HYPERTENSION, BENIGN SYSTEMIC   New onset of congestive heart failure ([place])    1. HTN urgency - new onset CHF 1. Cont amlodipine and losartan 2. Labetalol 10-20mg  IV Q4H prn 3. Lasix 40mg  IV x1 dose 4. Add hydralazine if needed 5. CHF pathway 6. Strict intake and output 7. Daily BMP 8. 2d echo 9. Tele monitor 2. DM2 - 1. Hold home janumet 2. Mod scale SSI AC/HS  DVT prophylaxis: Lovenox Code Status: Full Family Communication: Family at bedside Disposition Plan: Home after admit Consults called: None Admission status: Place in 60    [place], [place] Hospitalists  How to contact the [facility] Attending or Consulting provider [place] or covering provider during after hours [place], for this patient?  1. Check the care team in [facility] and look for a) attending/consulting [facility] provider listed and b) the [facility] team listed 2. Log into [email or web address]  Amion Physician Scheduling and messaging for groups and whole hospitals  On call and physician scheduling  software for group practices, residents, hospitalists and other medical providers for call, clinic, rotation and shift schedules. OnCall [employer] is a hospital-wide system for scheduling doctors and paging doctors on call. EasyPlot is for scientific plotting and data analysis.  [email or web address]  and use [place]'s universal password to access. If you do not have the password, please contact the hospital operator.  3. Locate the [facility] provider you are looking for under [facility] and page to a number that you can be directly reached. 4. If you still have difficulty reaching the provider, please page the [facility] (Director on Call) for the Hospitalists listed on amion for assistance.  10/17/2019, 2:23 AM

## 2019-10-17 NOTE — Plan of Care (Signed)
Pt will verbalize and participate in POC

## 2019-10-17 NOTE — ED Notes (Signed)
ED TO INPATIENT HANDOFF REPORT  Name/Age/Gender Veronica Simmons 68 y.o. female  Code Status    Code Status Orders  (From admission, onward)         Start     Ordered   10/17/19 0211  Full code  Continuous     10/17/19 0212        Code Status History    Date Active Date Inactive Code Status Order ID Comments User Context   03/07/2018 0817 03/08/2018 1555 Full Code [id number]  Damita Lack, MD ED   Advance Care Planning Activity      Home/SNF/Other Home  Chief Complaint O2 sat 70; shob; cough; sore throat  Level of Care/Admitting Diagnosis ED Disposition    ED Disposition Condition [facility] Area: [place] [[id number]]  Level of Care: Telemetry [5]  Admit to tele based on following criteria: Acute CHF  Covid Evaluation: Asymptomatic Screening Protocol (No Symptoms)  Diagnosis: New onset of congestive heart failure [facility]) [[id number]]  Admitting Physician: Etta Quill [phone]  Attending Physician: Etta Quill [[id number]]  PT Class (Do Not Modify): Observation [104]  PT Acc Code (Do Not Modify): Observation [10022]       Medical History Past Medical History:  Diagnosis Date  . ALLERGIC RHINITIS 02/19/2007   Qualifier: Diagnosis of  By: Lorne Skeens MD, VALENICA    . Anemia 1970s   on iron   . Arthritis   . Cubital tunnel syndrome on right 10/31/2012   Secondary to old fracture. Evaluated by ortho. [facility] offered surgery but patient refused. Numbness and burning pain 4th and 5th digit.    . Diabetes mellitus 2000   Never on insulin   . High cholesterol   . Hypertension 2000    Allergies Allergies  Allergen Reactions  . Levofloxacin Hives    IV Location/Drains/Wounds Patient Lines/Drains/Airways Status   Active Line/Drains/Airways    Name:   Placement date:   Placement time:   Site:   Days:   Peripheral IV 10/16/19 Left Antecubital   10/16/19    2121    Antecubital   1   Peripheral IV 10/16/19 Right  Antecubital   10/16/19    2242    Antecubital   1          Labs/Imaging Results for orders placed or performed during the hospital encounter of 10/16/19 (from the past 48 hour(s))  Basic metabolic panel     Status: None   Collection Time: 10/16/19  9:20 PM  Result Value Ref Range   Sodium 140 135 - 145 mmol/L   Potassium 3.7 3.5 - 5.1 mmol/L   Chloride 105 98 - 111 mmol/L   CO2 25 22 - 32 mmol/L   Glucose, Bld 89 70 - 99 mg/dL   BUN 15 8 - 23 mg/dL   Creatinine, Ser 0.96 0.44 - 1.00 mg/dL   Calcium 9.4 8.9 - 10.3 mg/dL   GFR calc non Af Amer >60 >60 mL/min   GFR calc Af Amer >60 >60 mL/min   Anion gap 10 5 - 15    Comment: Performed at [facility] [place] [address], [place], [place] [address]  CBC with Differential     Status: Abnormal   Collection Time: 10/16/19  9:20 PM  Result Value Ref Range   WBC 6.1 4.0 - 10.5 K/uL   RBC 3.70 (L) 3.87 - 5.11 MIL/uL   Hemoglobin 10.5 (L) 12.0 - 15.0 g/dL  HCT 34.5 (L) 36.0 - 46.0 %   MCV 93.2 80.0 - 100.0 fL   MCH 28.4 26.0 - 34.0 pg   MCHC 30.4 30.0 - 36.0 g/dL   RDW 14.1 11.5 - 15.5 %   Platelets 304 150 - 400 K/uL   nRBC 0.0 0.0 - 0.2 %   Neutrophils Relative % 52 %   Neutro Abs 3.1 1.7 - 7.7 K/uL   Lymphocytes Relative 40 %   Lymphs Abs 2.5 0.7 - 4.0 K/uL   Monocytes Relative 5 %   Monocytes Absolute 0.3 0.1 - 1.0 K/uL   Eosinophils Relative 3 %   Eosinophils Absolute 0.2 0.0 - 0.5 K/uL   Basophils Relative 0 %   Basophils Absolute 0.0 0.0 - 0.1 K/uL   Immature Granulocytes 0 %   Abs Immature Granulocytes 0.02 0.00 - 0.07 K/uL    Comment: Performed at [facility] [place] [address], [place], [place] [address]  D-dimer, quantitative     Status: Abnormal   Collection Time: 10/16/19  9:20 PM  Result Value Ref Range   D-Dimer, Quant 1.05 (H) 0.00 - 0.50 ug/mL-FEU    Comment: (NOTE) At the manufacturer cut-off of 0.50 ug/mL FEU, this assay has been documented to exclude PE with a  sensitivity and negative predictive value of 97 to 99%.  At this time, this assay has not been approved by the FDA to exclude DVT/VTE. Results should be correlated with clinical presentation. Performed at [facility], [place] [address] [place], [place] [address]   Troponin I (High Sensitivity)     Status: None   Collection Time: 10/17/19 12:24 AM  Result Value Ref Range   Troponin I (High Sensitivity) 17 <18 ng/L    Comment: (NOTE) Elevated high sensitivity troponin I (hsTnI) values and significant  changes across serial measurements may suggest ACS but many other  chronic and acute conditions are known to elevate hsTnI results.  Refer to the "Links" section for chest pain algorithms and additional  guidance. Performed at [facility], [place] [address], [place], [place] [address]   Troponin I (High Sensitivity)     Status: None   Collection Time: 10/17/19  3:16 AM  Result Value Ref Range   Troponin I (High Sensitivity) 15 <18 ng/L    Comment: (NOTE) Elevated high sensitivity troponin I (hsTnI) values and significant  changes across serial measurements may suggest ACS but many other  chronic and acute conditions are known to elevate hsTnI results.  Refer to the "Links" section for chest pain algorithms and additional  guidance. Performed at [facility], [place] [address] [place], [place] [address]    Dg Chest 2 View  Result Date: 10/16/2019 CLINICAL DATA:  Cough and sore throat for 3 days. EXAM: CHEST - 2 VIEW COMPARISON:  05/27/2012 FINDINGS: Heart size is at the upper limits of normal and stable. Aortic atherosclerosis. Both lungs are clear. No evidence of pleural effusion. IMPRESSION: Stable exam. No active cardiopulmonary disease. Electronically Signed   By: Marlaine Hind M.D.   On: 10/16/2019 18:06   Ct Angio Chest Pe W/cm &/or Wo Cm  Result Date: 10/16/2019 CLINICAL DATA:  PE suspected, positive D-dimer, 2 weeks of  shortness of breath EXAM: CT ANGIOGRAPHY CHEST WITH CONTRAST TECHNIQUE: Multidetector CT imaging of the chest was performed using the standard protocol during bolus administration of intravenous contrast. Multiplanar CT image reconstructions and MIPs were obtained to evaluate the vascular anatomy. CONTRAST:  130mL  OMNIPAQUE IOHEXOL 350 MG/ML SOLN COMPARISON:  Chest radiograph same day FINDINGS: Cardiovascular: Satisfactory opacification the pulmonary arteries to the segmental level with respiratory motion artifact most pronounced in the bases limiting evaluation beyond the lobar low. No central or lobar pulmonary artery filling defects are identified. Central pulmonary arteries are normal caliber. No elevation of the RV/LV ratio (0.7). Atherosclerotic plaque throughout the nonaneurysmal thoracic aorta. Normal 3 vessel branching of the arch with minimal plaque in the proximal great vessels which are otherwise unremarkable. Normal heart size. No pericardial effusion. Atherosclerotic calcification of the coronary arteries. Mediastinum/Nodes: Scattered low-attenuation nodes throughout the mediastinum and bilateral hila, largest a 9 mm pretracheal lymph node (4/32). No axillary adenopathy. No acute abnormality of the trachea or esophagus. Lungs/Pleura: Mosaic attenuation in the lungs likely reflective of imaging during exhalation with some additional bandlike areas of atelectasis in the lower lungs. There is a small right pleural effusion. Interlobular septal thickening is noted in both lung apices and lung bases. Mild peribronchovascular thickening is noted as well. Upper Abdomen: No acute abnormalities present in the visualized portions of the upper abdomen. Musculoskeletal: No acute osseous abnormality or suspicious osseous lesion. Dense calcification of the costal cartilages. Multilevel degenerative changes are present in the imaged portions of the spine. No suspicious chest wall lesions. Benign macrocalcification in  the left breast. Review of the MIP images confirms the above findings. IMPRESSION: 1. No evidence of pulmonary embolism to the segmental level. Evaluation beyond the segmental level is limited due to respiratory motion artifact. 2. Interlobular septal thickening in both lung apices and lung bases, suggestive of mild pulmonary edema. Small right pleural effusion. 3. Scattered low-attenuation nodes throughout the mediastinum and bilateral hila, nonspecific, but favored to be reactive. 4. Aortic Atherosclerosis (ICD10-I70.0). Electronically Signed   By: Lovena Le M.D.   On: 10/16/2019 23:12    Pending Labs Unresulted Labs (From admission, onward)    Start     Ordered   10/18/19 4270  Basic metabolic panel  Daily,   R     10/17/19 0212   10/17/19 0209  HIV Antibody (routine testing w rflx)  (HIV Antibody (Routine testing w reflex) panel)  Once,   STAT     10/17/19 0212   10/17/19 0207  Hemoglobin A1c  Once,   STAT    Comments: To assess prior glycemic control    10/17/19 0207   10/17/19 0140  SARS CORONAVIRUS 2 (TAT 6-24 HRS) Nasopharyngeal Nasopharyngeal Swab  (Asymptomatic/Tier 2 Patients Labs)  Once,   STAT    Question Answer Comment  Is this test for diagnosis or screening Screening   Symptomatic for COVID-19 as defined by CDC No   Hospitalized for COVID-19 No   Admitted to ICU for COVID-19 No   Previously tested for COVID-19 No   Resident in a congregate (group) care setting No   Employed in healthcare setting No   Pregnant No      10/17/19 0139          Vitals/Pain Today's Vitals   10/17/19 0300 10/17/19 0330 10/17/19 0400 10/17/19 0430  BP: (!) 193/79 (!) 189/84 (!) 172/78 (!) 155/72  Pulse: 78 78 79 84  Resp: (!) 22 (!) 21 20 20   Temp:      TempSrc:      SpO2: 97% 94% 94% 95%    Isolation Precautions No active isolations  Medications Medications  labetalol (NORMODYNE) injection 10-20 mg (has no administration in time range)  insulin aspart (novoLOG) injection  0-15 Units (has no administration in time range)  insulin aspart (novoLOG) injection 0-5 Units (has no administration in time range)  sodium chloride flush (NS) 0.9 % injection 3 mL (0 mLs Intravenous Hold 10/17/19 0308)  sodium chloride flush (NS) 0.9 % injection 3 mL (has no administration in time range)  0.9 %  sodium chloride infusion (has no administration in time range)  acetaminophen (TYLENOL) tablet 650 mg (has no administration in time range)  ondansetron (ZOFRAN) injection 4 mg (has no administration in time range)  enoxaparin (LOVENOX) injection 40 mg (has no administration in time range)  losartan (COZAAR) tablet 100 mg (has no administration in time range)  amLODipine (NORVASC) tablet 10 mg (has no administration in time range)  cloNIDine (CATAPRES) tablet 0.1 mg (0.1 mg Oral Given 10/16/19 2102)  iohexol (OMNIPAQUE) 350 MG/ML injection 100 mL (100 mLs Intravenous Contrast Given 10/16/19 2256)  labetalol (NORMODYNE) injection 10 mg (10 mg Intravenous Given 10/17/19 0059)  labetalol (NORMODYNE) injection 10 mg (10 mg Intravenous Given 10/17/19 0152)  furosemide (LASIX) injection 40 mg (40 mg Intravenous Given 10/17/19 0304)    Mobility walks

## 2019-10-17 NOTE — Progress Notes (Signed)
Pt admitted to room 1436 with CHF and hypertensive urgency, denies sob, purewick in place, pt remains in S/R BP steadily decreasing, continue to monitor, pt resting at this time, no distress noted.

## 2019-10-17 NOTE — Progress Notes (Signed)
  Echocardiogram 2D Echocardiogram has been performed.  Veronica Simmons 10/17/2019, 2:50 PM

## 2019-10-18 ENCOUNTER — Other Ambulatory Visit: Payer: Self-pay

## 2019-10-18 DIAGNOSIS — I509 Heart failure, unspecified: Secondary | ICD-10-CM | POA: Diagnosis not present

## 2019-10-18 DIAGNOSIS — I110 Hypertensive heart disease with heart failure: Secondary | ICD-10-CM | POA: Diagnosis not present

## 2019-10-18 DIAGNOSIS — E1140 Type 2 diabetes mellitus with diabetic neuropathy, unspecified: Secondary | ICD-10-CM | POA: Diagnosis not present

## 2019-10-18 DIAGNOSIS — I160 Hypertensive urgency: Secondary | ICD-10-CM | POA: Diagnosis not present

## 2019-10-18 LAB — RENAL FUNCTION PANEL
Albumin: 3.6 g/dL (ref 3.5–5.0)
Anion gap: 11 (ref 5–15)
BUN: 12 mg/dL (ref 8–23)
CO2: 27 mmol/L (ref 22–32)
Calcium: 9 mg/dL (ref 8.9–10.3)
Chloride: 100 mmol/L (ref 98–111)
Creatinine, Ser: 0.95 mg/dL (ref 0.44–1.00)
GFR calc Af Amer: >60 mL/min (ref >60)
GFR calc non Af Amer: >60 mL/min (ref >60)
Glucose, Bld: 188 mg/dL — ABNORMAL HIGH (ref 70–99)
Phosphorus: 3.6 mg/dL (ref 2.5–4.6)
Potassium: 3.4 mmol/L — ABNORMAL LOW (ref 3.5–5.1)
Sodium: 138 mmol/L (ref 135–145)

## 2019-10-18 LAB — GLUCOSE, CAPILLARY
Glucose-Capillary: 176 mg/dL — ABNORMAL HIGH (ref 70–99)
Glucose-Capillary: 207 mg/dL — ABNORMAL HIGH (ref 70–99)

## 2019-10-18 LAB — MAGNESIUM: Magnesium: 1.9 mg/dL (ref 1.7–2.4)

## 2019-10-18 LAB — TROPONIN I (HIGH SENSITIVITY): Troponin I (High Sensitivity): 17 ng/L (ref <18)

## 2019-10-18 MED ORDER — ISOSORB DINITRATE-HYDRALAZINE 20-37.5 MG PO TABS: 1.0000 | ORAL_TABLET | Freq: Three times a day (TID) | ORAL | 1 refills | Status: DC

## 2019-10-18 MED ORDER — SENNOSIDES-DOCUSATE SODIUM 8.6-50 MG PO TABS: 1.0000 | ORAL_TABLET | Freq: Two times a day (BID) | ORAL | 0 refills | Status: DC

## 2019-10-18 MED ORDER — CARVEDILOL 3.125 MG PO TABS: 3.1250 mg | ORAL_TABLET | Freq: Two times a day (BID) | ORAL | 0 refills | Status: DC

## 2019-10-18 MED ORDER — LISINOPRIL 40 MG PO TABS: 40.0000 mg | ORAL_TABLET | Freq: Every day | ORAL | 0 refills | Status: DC

## 2019-10-18 MED ORDER — POLYETHYLENE GLYCOL 3350 17 G PO PACK: 17.0000 g | PACK | Freq: Every day | ORAL | 0 refills | Status: DC

## 2019-10-18 MED ORDER — SIMETHICONE 80 MG PO CHEW
80.0000 mg | CHEWABLE_TABLET | Freq: Four times a day (QID) | ORAL | Status: DC | PRN
  Administered 2019-10-18: 80 mg via ORAL
  Filled 2019-10-18: qty 1

## 2019-10-18 MED ORDER — FUROSEMIDE 20 MG PO TABS: 20.0000 mg | ORAL_TABLET | Freq: Two times a day (BID) | ORAL | Status: DC | PRN

## 2019-10-18 MED ORDER — POTASSIUM CHLORIDE CRYS ER 20 MEQ PO TBCR
40.0000 meq | EXTENDED_RELEASE_TABLET | Freq: Once | ORAL | Status: AC
  Administered 2019-10-18: 40 meq via ORAL
  Filled 2019-10-18: qty 2

## 2019-10-18 MED ORDER — SENNOSIDES-DOCUSATE SODIUM 8.6-50 MG PO TABS
1.0000 | ORAL_TABLET | Freq: Two times a day (BID) | ORAL | Status: DC
  Administered 2019-10-18: 1 via ORAL
  Filled 2019-10-18: qty 1

## 2019-10-18 MED ORDER — AMLODIPINE BESYLATE 10 MG PO TABS: 10.0000 mg | ORAL_TABLET | Freq: Every day | ORAL | 3 refills | Status: DC

## 2019-10-18 MED ORDER — POLYETHYLENE GLYCOL 3350 17 G PO PACK
17.0000 g | PACK | Freq: Every day | ORAL | Status: DC
  Administered 2019-10-18: 17 g via ORAL
  Filled 2019-10-18: qty 1

## 2019-10-18 MED ORDER — FUROSEMIDE 20 MG PO TABS: 20.0000 mg | ORAL_TABLET | Freq: Two times a day (BID) | ORAL | 1 refills | Status: DC | PRN

## 2019-10-18 MED ORDER — CARVEDILOL 3.125 MG PO TABS
3.1250 mg | ORAL_TABLET | Freq: Two times a day (BID) | ORAL | Status: DC
  Filled 2019-10-18: qty 1

## 2019-10-18 MED ORDER — MORPHINE SULFATE (PF) 2 MG/ML IV SOLN
2.0000 mg | Freq: Once | INTRAVENOUS | Status: AC
  Administered 2019-10-18: 2 mg via INTRAVENOUS
  Filled 2019-10-18: qty 1

## 2019-10-18 NOTE — Discharge Summary (Signed)
Physician Discharge Summary  Patient ID: Veronica Simmons MRN: [id number] DOB/AGE: October 18, 1951 68 y.o.  Admit date: 10/16/2019 Discharge date: 10/18/2019  Admission Diagnoses:  Discharge Diagnoses:  Principal Problem:   Hypertensive urgency Active Problems:   Type 2 diabetes, controlled, with neuropathy ([place])   HYPERTENSION, BENIGN SYSTEMIC   New onset of congestive heart failure ([place])   Discharged Condition: stable  Hospital Course: Patient is a 68 year old female with past medical history significant for diabetes mellitus type 2 and hypertension.  Patient was admitted with 2-day history of shortness of breath and dyspnea on exertion.  On presentation to the hospital, the systolic blood pressure ranged from 200 to 220 mmHg.  Cardiac BNP was also elevated on presentation.  Patient was admitted and managed for hypertensive urgency and new onset acute diastolic congestive heart failure.  Losartan was changed to lisinopril.  Patient was also managed with amlodipine, Coreg, loop diuretics and BiDil.  Blood pressure has been gradually improved.  The primary care provider will kindly continue to monitor mild blood pressure.  Congestive heart failure symptoms have resolved.  Patient will follow with your primary care provider and cardiology team on discharge.  Consults: None  Significant Diagnostic Studies:  Echocardiogram revealed: Left ventricular estimated ejection fraction of 60 to 65% Moderately increased left ventricular hypertrophy Elevated left ventricular end-diastolic pressure Grade 1 diastolic dysfunction.  Chest x-ray was read as stable, without active cardiopulmonary disease.  CT angio of the chest was negative for pulmonary embolism.  However, it revealed interlobular septal thickening in both lung apices and lung bases, suggestive of mild pulmonary edema. Small right pleural Effusion.  Cardiac BNP was elevated at 215 point troponins came back negative.  Discharge  Exam: Blood pressure (!) 157/74, pulse 73, temperature 98.5 F (36.9 C), temperature source Oral, resp. rate 16, height 5' 5.51" (1.664 m), weight 98 kg, SpO2 95 %.  Disposition: Discharge disposition: 01-Home or Self Care  Discharge Instructions    Diet - low sodium heart healthy   Complete by: As directed    Diet Carb Modified   Complete by: As directed    Increase activity slowly   Complete by: As directed      Allergies as of 10/18/2019      Reactions   Levofloxacin Hives      Medication List    STOP taking these medications   acetaminophen 500 MG tablet Commonly known as: TYLENOL   BIOTIN PO   BLACK COHOSH PO   Centrum Silver 50+Women Tabs   EVENING PRIMROSE OIL PO   losartan 100 MG tablet Commonly known as: COZAAR   MAGNESIUM PO   Melatonin 5 MG Tabs   NONFORMULARY OR COMPOUNDED ITEM   pregabalin 150 MG capsule Commonly known as: Lyrica   traZODone 100 MG tablet Commonly known as: DESYREL   TURMERIC PO   vitamin E 1000 UNIT capsule     TAKE these medications   amLODipine 10 MG tablet Commonly known as: NORVASC Take 1 tablet (10 mg total) by mouth daily.   carvedilol 3.125 MG tablet Commonly known as: COREG Take 1 tablet (3.125 mg total) by mouth 2 (two) times daily with a meal.   furosemide 20 MG tablet Commonly known as: LASIX Take 1 tablet (20 mg total) by mouth 2 (two) times daily as needed for fluid or edema.   glucose blood test strip Use as instructed   glucose blood test strip Commonly known as: Accu-Chek Aviva Plus Use as instructed   glucose blood test  strip Commonly known as: Accu-Chek SmartView Test blood sugar once daily   isosorbide-hydrALAZINE 20-37.5 MG tablet Commonly known as: BIDIL Take 1 tablet by mouth 3 (three) times daily.   Janumet 50-1000 MG tablet Generic drug: sitaGLIPtin-metformin TAKE 1 TABLET BY MOUTH TWICE DAILY WITH MEALS What changed: when to take this   lisinopril 40 MG tablet Commonly known  as: ZESTRIL Take 1 tablet (40 mg total) by mouth daily.   onetouch ultrasoft lancets Use as instructed   polyethylene glycol 17 g packet Commonly known as: MIRALAX / GLYCOLAX Take 17 g by mouth daily. Start taking on: October 19, 2019   senna-docusate 8.6-50 MG tablet Commonly known as: Senokot-S Take 1 tablet by mouth 2 (two) times daily.   vitamin C 500 MG tablet Commonly known as: ASCORBIC ACID Take 500 mg by mouth daily.        SignedBonnell Public 10/18/2019, 3:32 PM

## 2019-10-18 NOTE — Plan of Care (Signed)

## 2019-10-27 ENCOUNTER — Other Ambulatory Visit: Payer: Self-pay

## 2019-10-27 DIAGNOSIS — Z20822 Contact with and (suspected) exposure to covid-19: Secondary | ICD-10-CM

## 2019-10-29 LAB — NOVEL CORONAVIRUS, NAA: SARS-CoV-2, NAA: NOT DETECTED

## 2019-11-01 ENCOUNTER — Telehealth: Payer: Self-pay

## 2019-11-01 NOTE — Telephone Encounter (Signed)
Called and informed patient that test for Covid 19 was NEGATIVE. Discussed signs and symptoms of Covid 19 : fever, chills, respiratory symptoms, cough, ENT symptoms, sore throat, SOB, muscle pain, diarrhea, headache, loss of taste/smell, close exposure to COVID-19 patient. Pt instructed to call PCP if they develop the above signs and sx. Pt also instructed to call 911 if having respiratory issues/distress. Discussed MyChart enrollment. Pt verbalized understanding.  

## 2019-11-04 ENCOUNTER — Telehealth: Payer: Self-pay | Admitting: *Deleted

## 2019-11-04 NOTE — Telephone Encounter (Signed)
Patient called and states she has called several times to have her COVID results released  in my chart. Patient was made aware they have been previously viewed already. I attempted to call patient back after confirming the results had been viewed to assist her in using my chart at [phone]   Please disregard  encounter below

## 2019-11-05 ENCOUNTER — Telehealth: Payer: Self-pay

## 2019-11-05 NOTE — Telephone Encounter (Signed)
Per pt. Request, faxed COVID 19 test results to [facility], AttentionRoyetta Asal @ [phone].

## 2020-03-03 ENCOUNTER — Encounter (INDEPENDENT_AMBULATORY_CARE_PROVIDER_SITE_OTHER): Payer: Medicare Other | Admitting: Ophthalmology

## 2020-05-10 ENCOUNTER — Encounter: Payer: Self-pay | Admitting: Gastroenterology

## 2020-06-11 ENCOUNTER — Other Ambulatory Visit: Payer: Self-pay | Admitting: Nephrology

## 2020-06-11 DIAGNOSIS — I129 Hypertensive chronic kidney disease with stage 1 through stage 4 chronic kidney disease, or unspecified chronic kidney disease: Secondary | ICD-10-CM

## 2020-06-11 DIAGNOSIS — R809 Proteinuria, unspecified: Secondary | ICD-10-CM

## 2020-07-06 ENCOUNTER — Telehealth: Payer: Self-pay | Admitting: *Deleted

## 2020-07-06 NOTE — Telephone Encounter (Signed)
Missed pre-visit today @ 9:00.  Message left to call and reschedule pre-visit today to avoid cancellation of up-coming colonoscopy on 07/20/2020

## 2020-07-06 NOTE — Telephone Encounter (Signed)
Attempted second call to patient, no return call. Colonoscopy cancelled and cancellation letter sent.

## 2020-07-07 ENCOUNTER — Other Ambulatory Visit: Payer: Self-pay

## 2020-07-07 ENCOUNTER — Ambulatory Visit (AMBULATORY_SURGERY_CENTER): Payer: Self-pay

## 2020-07-07 VITALS — Ht 65.5 in | Wt 218.8 lb

## 2020-07-07 DIAGNOSIS — Z86010 Personal history of colonic polyps: Secondary | ICD-10-CM

## 2020-07-07 DIAGNOSIS — Z01818 Encounter for other preprocedural examination: Secondary | ICD-10-CM

## 2020-07-07 MED ORDER — GOLYTELY 236 G PO SOLR: 4000.0000 mL | ORAL | 0 refills | Status: DC

## 2020-07-07 NOTE — Progress Notes (Signed)
No egg or soy allergy known to patient  No issues with past sedation with any surgeries or procedures no intubation problems in the past  No diet pills per patient No home 02 use per patient  No blood thinners per patient  Pt denies issues with constipation  No A fib or A flutter  EMMI video to [place] 19 guidelines implemented in PV today   COVID screening scheduled on 07/15/2020 at 3:00 pm; patient is aware of appt date/time;   Due to the COVID-19 pandemic we are asking patients to follow these guidelines. Please only bring one care partner. Please be aware that your care partner may wait in the car in the parking lot or if they feel like they will be too hot to wait in the car, they may wait in the lobby on the 4th floor. All care partners are required to wear a mask the entire time (we do not have any that we can provide them), they need to practice social distancing, and we will do a Covid check for all patient's and care partners when you arrive. Also we will check their temperature and your temperature. If the care partner waits in their car they need to stay in the parking lot the entire time and we will call them on their cell phone when the patient is ready for discharge so they can bring the car to the front of the building. Also all patient's will need to wear a mask into building.

## 2020-07-15 ENCOUNTER — Ambulatory Visit (INDEPENDENT_AMBULATORY_CARE_PROVIDER_SITE_OTHER): Payer: Medicare Other

## 2020-07-15 ENCOUNTER — Other Ambulatory Visit: Payer: Self-pay | Admitting: Gastroenterology

## 2020-07-15 DIAGNOSIS — Z1159 Encounter for screening for other viral diseases: Secondary | ICD-10-CM

## 2020-07-16 LAB — SARS CORONAVIRUS 2 (TAT 6-24 HRS): SARS Coronavirus 2: NEGATIVE

## 2020-07-20 ENCOUNTER — Encounter: Payer: Self-pay | Admitting: Gastroenterology

## 2020-07-20 ENCOUNTER — Other Ambulatory Visit: Payer: Self-pay

## 2020-07-20 ENCOUNTER — Ambulatory Visit (AMBULATORY_SURGERY_CENTER): Payer: Medicare Other | Admitting: Gastroenterology

## 2020-07-20 VITALS — BP 142/67 | HR 57 | Temp 96.6°F | Resp 27 | Ht 65.0 in | Wt 218.0 lb

## 2020-07-20 DIAGNOSIS — Z86010 Personal history of colonic polyps: Secondary | ICD-10-CM

## 2020-07-20 DIAGNOSIS — K635 Polyp of colon: Secondary | ICD-10-CM

## 2020-07-20 DIAGNOSIS — D123 Benign neoplasm of transverse colon: Secondary | ICD-10-CM

## 2020-07-20 MED ORDER — SODIUM CHLORIDE 0.9 % IV SOLN: 500.0000 mL | Freq: Once | INTRAVENOUS | Status: DC

## 2020-07-20 NOTE — Patient Instructions (Signed)
YOU HAD AN ENDOSCOPIC PROCEDURE TODAY AT THE [facility]:   Refer to the procedure report that was given to you for any specific questions about what was found during the examination.  If the procedure report does not answer your questions, please call your gastroenterologist to clarify.  If you requested that your care partner not be given the details of your procedure findings, then the procedure report has been included in a sealed envelope for you to review at your convenience later.  YOU SHOULD EXPECT: Some feelings of bloating in the abdomen. Passage of more gas than usual.  Walking can help get rid of the air that was put into your GI tract during the procedure and reduce the bloating. If you had a lower endoscopy (such as a colonoscopy or flexible sigmoidoscopy) you may notice spotting of blood in your stool or on the toilet paper. If you underwent a bowel prep for your procedure, you may not have a normal bowel movement for a few days.  Please Note:  You might notice some irritation and congestion in your nose or some drainage.  This is from the oxygen used during your procedure.  There is no need for concern and it should clear up in a day or so.  SYMPTOMS TO REPORT IMMEDIATELY:   Following lower endoscopy (colonoscopy or flexible sigmoidoscopy):  Excessive amounts of blood in the stool  Significant tenderness or worsening of abdominal pains  Swelling of the abdomen that is new, acute  Fever of 100F or higher   Following upper endoscopy (EGD)  Vomiting of blood or coffee ground material  New chest pain or pain under the shoulder blades  Painful or persistently difficult swallowing  New shortness of breath  Fever of 100F or higher  Black, tarry-looking stools  For urgent or emergent issues, a gastroenterologist can be reached at any hour by calling [phone]. Do not use MyChart messaging for urgent concerns.    DIET:  We do recommend a small meal at first, but  then you may proceed to your regular diet.  Drink plenty of fluids but you should avoid alcoholic beverages for 24 hours.  ACTIVITY:  You should plan to take it easy for the rest of today and you should NOT DRIVE or use heavy machinery until tomorrow (because of the sedation medicines used during the test).    FOLLOW UP: Our staff will call the number listed on your records 48-72 hours following your procedure to check on you and address any questions or concerns that you may have regarding the information given to you following your procedure. If we do not reach you, we will leave a message.  We will attempt to reach you two times.  During this call, we will ask if you have developed any symptoms of COVID 19. If you develop any symptoms (ie: fever, flu-like symptoms, shortness of breath, cough etc.) before then, please call [phone].  If you test positive for Covid 19 in the 2 weeks post procedure, please call and report this information to us.    If any biopsies were taken you will be contacted by phone or by letter within the next 1-3 weeks.  Please call us at [phone] if you have not heard about the biopsies in 3 weeks.    SIGNATURES/CONFIDENTIALITY: You and/or your care partner have signed paperwork which will be entered into your electronic medical record.  These signatures attest to the fact that that the information above on   your After Visit Summary has been reviewed and is understood.  Full responsibility of the confidentiality of this discharge information lies with you and/or your care-partner. 

## 2020-07-20 NOTE — Op Note (Addendum)
[facility] Patient Name: Veronica Simmons Procedure Date: 07/20/2020 9:11 AM MRN: [id number] Endoscopist: Justice Britain , MD Age: 69 Referring MD:  Date of Birth: 02-09-1951 Gender: Female Account #: [id number] Procedure:                Colonoscopy Indications:              High risk colon cancer surveillance: Personal                            history of sessile serrated colon polyp (less than                            10 mm in size) with no dysplasia and poor                            preparation on last colonoscopy Medicines:                Monitored Anesthesia Care Procedure:                Pre-Anesthesia Assessment:                           - Prior to the procedure, a History and Physical                            was performed, and patient medications and                            allergies were reviewed. The patient's tolerance of                            previous anesthesia was also reviewed. The risks                            and benefits of the procedure and the sedation                            options and risks were discussed with the patient.                            All questions were answered, and informed consent                            was obtained. Prior Anticoagulants: The patient has                            taken no previous anticoagulant or antiplatelet                            agents. ASA Grade Assessment: II - A patient with                            mild systemic disease. After reviewing the risks  and benefits, the patient was deemed in                            satisfactory condition to undergo the procedure.                           After obtaining informed consent, the colonoscope                            was passed under direct vision. Throughout the                            procedure, the patient's blood pressure, pulse, and                            oxygen saturations were monitored  continuously. The                            Colonoscope was introduced through the anus and                            advanced to the 5 cm into the ileum. The                            colonoscopy was performed without difficulty -                            though we had already placed an abdominal binder on                            patient because last procedure was more difficult                            from a looping/redundancy perspective. The patient                            tolerated the procedure. The quality of the bowel                            preparation was adequate. The terminal ileum,                            ileocecal valve, appendiceal orifice, and rectum                            were photographed. Scope In: 9:18:30 AM Scope Out: 9:42:22 AM Scope Withdrawal Time: 0 hours 14 minutes 20 seconds  Total Procedure Duration: 0 hours 23 minutes 52 seconds  Findings:                 The digital rectal exam findings include                            hemorrhoids. Pertinent negatives include no  palpable rectal lesions.                           The terminal ileum and ileocecal valve appeared                            normal.                           A 2 mm polyp was found in the transverse colon. The                            polyp was sessile. The polyp was removed with a                            cold snare. Resection and retrieval were complete.                           Normal mucosa was found in the entire colon                            otherwise - preparation much improved.                           Non-bleeding non-thrombosed external and internal                            hemorrhoids were found during retroflexion, during                            perianal exam and during digital exam. The                            hemorrhoids were Grade II (internal hemorrhoids                            that prolapse but reduce  spontaneously). Complications:            No immediate complications. Estimated Blood Loss:     Estimated blood loss was minimal. Impression:               - Hemorrhoids found on digital rectal exam.                           - The examined portion of the ileum was normal.                           - One 2 mm polyp in the transverse colon, removed                            with a cold snare. Resected and retrieved.                           - Normal mucosa in the entire examined colon  otherwise                           - Non-bleeding non-thrombosed external and internal                            hemorrhoids. Recommendation:           - The patient will be observed post-procedure,                            until all discharge criteria are met.                           - Discharge patient to home.                           - Patient has a contact number available for                            emergencies. The signs and symptoms of potential                            delayed complications were discussed with the                            patient. Return to normal activities tomorrow.                            Written discharge instructions were provided to the                            patient.                           - High fiber diet.                           - Use FiberCon 1-2 tablets PO daily.                           - Continue present medications.                           - Await pathology results.                           - Repeat colonoscopy in 5 years for surveillance                            even if polyp is not adenomatous/serrated due to                            history of previous colonoscopy with poor                            preparation and prior SSP finding within last year.  Use this same preparation for next colonoscopy.                            Also use abdominal binder.                            - The findings and recommendations were discussed                            with the patient. Justice Britain, MD 07/20/2020 9:50:57 AM

## 2020-07-20 NOTE — Progress Notes (Signed)
Pt's states no medical or surgical changes since previsit or office visit. 

## 2020-07-20 NOTE — Progress Notes (Signed)
Report to PACU, RN, vss, BBS= Clear.  

## 2020-07-22 ENCOUNTER — Telehealth: Payer: Self-pay | Admitting: *Deleted

## 2020-07-22 ENCOUNTER — Telehealth: Payer: Self-pay

## 2020-07-22 NOTE — Telephone Encounter (Signed)
°  Follow up Call-  Call back number 07/20/2020 08/29/2018  Post procedure Call Back phone  # [phone]  Permission to leave phone message Yes Yes  Some recent data might be hidden     Patient questions:  Do you have a fever, pain , or abdominal swelling? No. Pain Score  0 *  Have you tolerated food without any problems? Yes.    Have you been able to return to your normal activities? Yes.    Do you have any questions about your discharge instructions: Diet   No. Medications  No. Follow up visit  No.  Do you have questions or concerns about your Care? No.  Actions: * If pain score is 4 or above: No action needed, pain <4.  1. Have you developed a fever since your procedure? no  2.   Have you had an respiratory symptoms (SOB or cough) since your procedure? no  3.   Have you tested positive for COVID 19 since your procedure no  4.   Have you had any family members/close contacts diagnosed with the COVID 19 since your procedure?  no   If yes to any of these questions please route to Joylene John, RN and Erenest Rasher, RN

## 2020-07-22 NOTE — Telephone Encounter (Signed)
LVM

## 2020-07-30 ENCOUNTER — Other Ambulatory Visit: Payer: Self-pay

## 2020-07-30 ENCOUNTER — Emergency Department (HOSPITAL_COMMUNITY): Payer: Medicare Other

## 2020-07-30 ENCOUNTER — Encounter (HOSPITAL_COMMUNITY): Payer: Self-pay

## 2020-07-30 ENCOUNTER — Emergency Department (HOSPITAL_COMMUNITY)
Admission: EM | Admit: 2020-07-30 | Discharge: 2020-07-30 | Disposition: A | Discharge status: Home / Self Care | Payer: Medicare Other | Attending: Emergency Medicine | Admitting: Emergency Medicine

## 2020-07-30 DIAGNOSIS — U071 COVID-19: Secondary | ICD-10-CM | POA: Insufficient documentation

## 2020-07-30 DIAGNOSIS — I110 Hypertensive heart disease with heart failure: Secondary | ICD-10-CM | POA: Diagnosis not present

## 2020-07-30 DIAGNOSIS — Z79899 Other long term (current) drug therapy: Secondary | ICD-10-CM | POA: Diagnosis not present

## 2020-07-30 DIAGNOSIS — R5383 Other fatigue: Secondary | ICD-10-CM | POA: Diagnosis present

## 2020-07-30 DIAGNOSIS — Z7984 Long term (current) use of oral hypoglycemic drugs: Secondary | ICD-10-CM | POA: Insufficient documentation

## 2020-07-30 DIAGNOSIS — I509 Heart failure, unspecified: Secondary | ICD-10-CM | POA: Diagnosis not present

## 2020-07-30 DIAGNOSIS — E1140 Type 2 diabetes mellitus with diabetic neuropathy, unspecified: Secondary | ICD-10-CM | POA: Insufficient documentation

## 2020-07-30 DIAGNOSIS — J1282 Pneumonia due to coronavirus disease 2019: Secondary | ICD-10-CM | POA: Diagnosis not present

## 2020-07-30 LAB — CBC WITH DIFFERENTIAL/PLATELET
Abs Immature Granulocytes: 0.01 10*3/uL (ref 0.00–0.07)
Basophils Absolute: 0 10*3/uL (ref 0.0–0.1)
Basophils Relative: 0 %
Eosinophils Absolute: 0 10*3/uL (ref 0.0–0.5)
Eosinophils Relative: 0 %
HCT: 31.1 % — ABNORMAL LOW (ref 36.0–46.0)
Hemoglobin: 10 g/dL — ABNORMAL LOW (ref 12.0–15.0)
Immature Granulocytes: 0 %
Lymphocytes Relative: 27 %
Lymphs Abs: 1.1 10*3/uL (ref 0.7–4.0)
MCH: 28.8 pg (ref 26.0–34.0)
MCHC: 32.2 g/dL (ref 30.0–36.0)
MCV: 89.6 fL (ref 80.0–100.0)
Monocytes Absolute: 0.2 10*3/uL (ref 0.1–1.0)
Monocytes Relative: 6 %
Neutro Abs: 2.7 10*3/uL (ref 1.7–7.7)
Neutrophils Relative %: 67 %
Platelets: 196 10*3/uL (ref 150–400)
RBC: 3.47 MIL/uL — ABNORMAL LOW (ref 3.87–5.11)
RDW: 13.7 % (ref 11.5–15.5)
WBC: 4 10*3/uL (ref 4.0–10.5)
nRBC: 0 % (ref 0.0–0.2)

## 2020-07-30 LAB — URINALYSIS, ROUTINE W REFLEX MICROSCOPIC
Bilirubin Urine: NEGATIVE
Glucose, UA: 150 mg/dL — AB
Hgb urine dipstick: NEGATIVE
Ketones, ur: NEGATIVE mg/dL
Nitrite: NEGATIVE
Protein, ur: >=300 mg/dL — AB
Specific Gravity, Urine: 1.018 (ref 1.005–1.030)
pH: 5 (ref 5.0–8.0)

## 2020-07-30 LAB — BASIC METABOLIC PANEL
Anion gap: 10 (ref 5–15)
BUN: 28 mg/dL — ABNORMAL HIGH (ref 8–23)
CO2: 23 mmol/L (ref 22–32)
Calcium: 8.4 mg/dL — ABNORMAL LOW (ref 8.9–10.3)
Chloride: 98 mmol/L (ref 98–111)
Creatinine, Ser: 1.76 mg/dL — ABNORMAL HIGH (ref 0.44–1.00)
GFR calc Af Amer: 34 mL/min — ABNORMAL LOW (ref >60)
GFR calc non Af Amer: 29 mL/min — ABNORMAL LOW (ref >60)
Glucose, Bld: 295 mg/dL — ABNORMAL HIGH (ref 70–99)
Potassium: 4.1 mmol/L (ref 3.5–5.1)
Sodium: 131 mmol/L — ABNORMAL LOW (ref 135–145)

## 2020-07-30 LAB — PROCALCITONIN: Procalcitonin: <0.1 ng/mL

## 2020-07-30 LAB — TROPONIN I (HIGH SENSITIVITY): Troponin I (High Sensitivity): 20 ng/L — ABNORMAL HIGH (ref <18)

## 2020-07-30 LAB — SARS CORONAVIRUS 2 BY RT PCR (HOSPITAL ORDER, PERFORMED IN ~~LOC~~ HOSPITAL LAB): SARS Coronavirus 2: POSITIVE — AB

## 2020-07-30 MED ORDER — ACETAMINOPHEN 325 MG PO TABS
650.0000 mg | ORAL_TABLET | Freq: Once | ORAL | Status: AC
  Administered 2020-07-30: 650 mg via ORAL
  Filled 2020-07-30: qty 2

## 2020-07-30 MED ORDER — AZITHROMYCIN 250 MG PO TABS
ORAL_TABLET | ORAL | 0 refills | Status: DC
Start: 2020-07-30 — End: 2020-08-09

## 2020-07-30 MED ORDER — SODIUM CHLORIDE 0.9 % IV BOLUS
1000.0000 mL | Freq: Once | INTRAVENOUS | Status: AC
  Administered 2020-07-30: 1000 mL via INTRAVENOUS

## 2020-07-30 NOTE — ED Provider Notes (Signed)
[facility] DEPT Provider Note   CSN: [id number] Arrival date & time: 07/30/20  1622     History Chief Complaint  Patient presents with   Cough   Nasal Congestion   Chills   Sore Throat    Veronica Simmons is a 69 y.o. female with PMH of HTN, HLD, and DM who presents the ED with a 5-day history of fatigue and weakness.  Patient reports that she has received the first of her second COVID-19 vaccinations.  She states that she feels as though all of her energy has been "zapped" from her body and is intermittently lightheaded as though she might pass out.  Patient is also endorsing severe chills and subjective fevers.  She has not checked her temperature at home.  She does endorse diminished appetite and cough, but no other symptoms.  She adamantly denies any chest pain, shortness of breath, abdominal pain, nausea or vomiting, changes in bowel habits, or urinary symptoms.  HPI     Past Medical History:  Diagnosis Date   ALLERGIC RHINITIS 02/19/2007   Qualifier: Diagnosis of  By: Lorne Skeens MD, VALENICA     Allergy    seasonal   Anemia 1970s   on iron    Arthritis    Cubital tunnel syndrome on right 2020   Secondary to old fracture. Evaluated by ortho. [facility] offered surgery but patient refused. Numbness and burning pain 4th and 5th digit.     Diabetes mellitus 2000   Never on insulin    High cholesterol    Hypertension 2000    Patient Active Problem List   Diagnosis Date Noted   New onset of congestive heart failure ([place]) 10/17/2019   Hypertensive urgency 10/17/2019   Right sided weakness 03/07/2018   HTN (hypertension) 03/07/2018   Obesity (BMI 30-39.9) 03/07/2018   Skin papules, generalized 08/04/2016   Skin lesion of right lower limb 06/19/2016   Leg swelling 05/12/2016   Subconjunctival hemorrhage of right eye 04/27/2016   Diarrhea 01/09/2016   Health care maintenance 06/28/2015   Anemia  12/21/2014   Palpitations 01/07/2014   Recurrent yeast vaginitis 11/05/2013   Recurrent genital herpes 07/17/2013   Hot flashes not due to menopause 11/27/2012   Cubital tunnel syndrome on right 10/31/2012   Type 2 diabetes, controlled, 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     Family History  Problem Relation Age of Onset   Early death Mother 80       shot    Heart disease Father 40   Diabetes Daughter    Kidney disease Daughter        on dialysis    Colon polyps Daughter    Diabetes Maternal Aunt    Cancer Paternal Uncle        Breast Cancer    Breast cancer Maternal Grandmother    Colon cancer Neg Hx    Esophageal cancer Neg Hx    Stomach cancer Neg Hx    Rectal cancer Neg Hx     Social History   Tobacco Use   Smoking status: Never Smoker   Smokeless  tobacco: Never Used  Vaping Use   Vaping Use: Never used  Substance Use Topics   Alcohol use: No   Drug use: No    Home Medications Prior to Admission medications   Medication Sig Start Date End Date Taking? Authorizing Provider  amLODipine (NORVASC) 10 MG tablet Take 1 tablet (10 mg total) by mouth daily. 10/18/19   Bonnell Public, MD  azithromycin (ZITHROMAX Z-PAK) 250 MG tablet 500 mg PO Day 1 250 mg PO Day 2-5 07/30/20   Krista Blue L, PA-C  beta carotene 15 MG capsule Take 1 capsule by mouth daily.    [provider]  carvedilol (COREG) 3.125 MG tablet Take 1 tablet (3.125 mg total) by mouth 2 (two) times daily with a meal. 10/18/19   Bonnell Public, MD  chlorthalidone (HYGROTON) 25 MG tablet Take 1 tablet  by mouth daily. 02/18/20   [provider]  Coenzyme Q10 (COQ10 PO) Take 1 tablet by mouth daily.    [provider]  ferrous sulfate 325 (65 FE) MG tablet Take 1 tablet by mouth daily.    [provider]  furosemide (LASIX) 20 MG tablet Take 1 tablet (20 mg total) by mouth 2 (two) times daily as needed for fluid or edema. 10/18/19   Dana Allan I, MD  glucose blood (ACCU-CHEK AVIVA PLUS) test strip Use as instructed 01/06/16   Marina Goodell A, MD  glucose blood (ACCU-CHEK SMARTVIEW) test strip Test blood sugar once daily 10/12/16   Haney, Alyssa A, MD  glucose blood test strip Use as instructed 12/29/15   Haney, Alyssa A, MD  JANUMET 50-1000 MG tablet TAKE 1 TABLET BY MOUTH TWICE DAILY WITH MEALS Patient taking differently: Take 1 tablet by mouth daily.  07/14/16   Veatrice Bourbon, MD  Lancets (ONETOUCH ULTRASOFT) lancets Use as instructed 08/27/15   Haney, Yetta Flock A, MD  lisinopril (ZESTRIL) 40 MG tablet Take 1 tablet (40 mg total) by mouth daily. 10/18/19   Bonnell Public, MD  losartan (COZAAR) 100 MG tablet Take 100 mg by mouth daily. 04/27/20   [provider]  Magnesium 250 MG TABS Take 1 tablet by mouth daily.    [provider]  traZODone (DESYREL) 100 MG tablet Take 200 mg by mouth at bedtime as needed. 03/18/20   [provider]  vitamin C (ASCORBIC ACID) 500 MG tablet Take 500 mg by mouth daily.    [provider]  vitamin E 1000 UNIT capsule Take 1 capsule by mouth daily.    [provider]  Vitamins/Minerals TABS Take 1 tablet by mouth daily.    [provider]    Allergies    Levofloxacin  Review of Systems   Review of Systems  All other systems reviewed and are negative.   Physical Exam Updated Vital Signs BP 132/63    Pulse 71    Temp 99.6 F (37.6 C) (Oral)    Resp (!) 24    Ht 5\' 5"  (1.651 m)    Wt 98.9 kg    SpO2 94%    BMI 36.28 kg/m   Physical Exam Vitals and nursing note reviewed.  Exam conducted with a chaperone present.  Constitutional:      General: She is not in acute distress.    Appearance: She is ill-appearing.  HENT:     Head: Normocephalic and atraumatic.     Mouth/Throat:     Pharynx: Oropharynx is clear.  Eyes:     General:  No scleral icterus.    Conjunctiva/sclera: Conjunctivae normal.  Cardiovascular:     Rate and Rhythm: Normal rate and regular rhythm.     Pulses: Normal pulses.     Heart sounds: Normal heart sounds.  Pulmonary:     Comments: No increased work of breathing.  Breath sounds intact bilaterally.  No obvious rales or wheezing appreciated.  No respiratory distress or accessory muscle use. Abdominal:     General: Abdomen is flat. There is no distension.     Palpations: Abdomen is soft.     Tenderness: There is no abdominal tenderness.  Musculoskeletal:     Cervical back: Normal range of motion and neck supple. No rigidity.     Right lower leg: No edema.     Left lower leg: No edema.  Skin:    General: Skin is dry.     Capillary Refill: Capillary refill takes less than 2 seconds.  Neurological:     General: No focal deficit present.     Mental Status: She is alert and oriented to person, place, and time.     GCS: GCS eye subscore is 4. GCS verbal subscore is 5. GCS motor subscore is 6.     Cranial Nerves: No cranial nerve deficit.     Sensory: No sensory deficit.     Gait: Gait normal.  Psychiatric:        Mood and Affect: Mood normal.        Behavior: Behavior normal.        Thought Content: Thought content normal.     ED Results / Procedures / Treatments   Labs (all labs ordered are listed, but only abnormal results are displayed) Labs Reviewed  SARS CORONAVIRUS 2 BY RT PCR (HOSPITAL ORDER, [facility]) - Abnormal; Notable for the following components:      Result Value   SARS Coronavirus 2 POSITIVE (*)    All other components within normal limits  CBC WITH DIFFERENTIAL/PLATELET - Abnormal;  Notable for the following components:   RBC 3.47 (*)    Hemoglobin 10.0 (*)    HCT 31.1 (*)    All other components within normal limits  BASIC METABOLIC PANEL - Abnormal; Notable for the following components:   Sodium 131 (*)    Glucose, Bld 295 (*)    BUN 28 (*)    Creatinine, Ser 1.76 (*)    Calcium 8.4 (*)    GFR calc non Af Amer 29 (*)    GFR calc Af Amer 34 (*)    All other components within normal limits  URINALYSIS, ROUTINE W REFLEX MICROSCOPIC - Abnormal; Notable for the following components:   APPearance HAZY (*)    Glucose, UA 150 (*)    Protein, ur >=300 (*)    Leukocytes,Ua TRACE (*)    Bacteria, UA RARE (*)    All other components within normal limits  TROPONIN I (HIGH SENSITIVITY) - Abnormal; Notable for the following components:   Troponin I (High Sensitivity) 20 (*)    All other components within normal limits  PROCALCITONIN    EKG EKG Interpretation  Date/Time:  Friday July 30 2020 18:35:50 EDT Ventricular Rate:  75 PR Interval:    QRS Duration: 93 QT Interval:  407 QTC Calculation: 455 R Axis:   59 Text Interpretation: Sinus rhythm Ventricular premature complex Low voltage, precordial leads No significant change since last tracing Confirmed by Calvert Cantor [phone]) on 07/30/2020 6:44:51 PM   Radiology DG  Chest Portable 1 View  Result Date: 07/30/2020 CLINICAL DATA:  Chills and cough EXAM: PORTABLE CHEST 1 VIEW COMPARISON:  October 16, 2019 FINDINGS: The heart size and mediastinal contours are borderline enlarged. Patchy airspace opacity is seen within the right upper lung. The left lung is clear. No pleural effusion. Aortic knob calcifications are seen. The visualized skeletal structures are unremarkable. IMPRESSION: Hazy airspace opacity in the right upper lung which may be due to atelectasis and/or infectious etiology. Electronically Signed   By: Prudencio Pair M.D.   On: 07/30/2020 19:02    Procedures Procedures (including critical care  time)  Medications Ordered in ED Medications  sodium chloride 0.9 % bolus 1,000 mL (1,000 mLs Intravenous New Bag/Given (Non-Interop) 07/30/20 2041)  acetaminophen (TYLENOL) tablet 650 mg (650 mg Oral Given 07/30/20 2041)    ED Course  I have reviewed the triage vital signs and the nursing notes.  Pertinent labs & imaging results that were available during my care of the patient were reviewed by me and considered in my medical decision making (see chart for details).    MDM Rules/Calculators/A&P                          Given that patient's primary complaint is profound fatigue and weakness, in conjunction with her past medical history, will obtain basic laboratory work-up and plain films of chest.  She has received the first of her 2 COVID-19 vaccinations and while her suspicion may be true, need to assess for other derangement.  She adamantly denies any urinary symptoms.  Canceled UA collection as source of fever, fatigue, and chills likely related to pneumonia.  While suspect possible COVID-19, will cover with Zithromax prescription and encourage her to follow-up with her PCP.    Labs CBC with differential: Mild anemia to 10.0, relatively consistent with baseline.  Denies melena. BMP: Worsening renal function.  Creatinine elevated to 1.76 and GFR reduced to 34, compared to 0.95 and greater than 60 and labs obtained 9 months ago. Troponin: Consistent with baseline.   Procalcitonin: <0.10.   UA: Trace leukocytes and rare bacteria, low suspicion for UTI given lack of any urinary symptoms.  Unremarkable.  Imaging I personally reviewed plain films obtained of chest which demonstrates hazy opacity in right upper lung concerning for atelectasis versus infectious process.  I personally reviewed EKG which demonstrates no significant deviation from prior tracings.  Discussed case with Dr. Karle Starch and we will administer 1 L IV NS and then ambulate patient in room to ensure that there is no drop  in O2 saturation prior to discharge home.  Patient has elevated BUN and given her fevers and chills I suspect that there is a degree of dehydration.  At this time, suspect it's due to prerenal azotemia in setting of her admitted diminished p.o. intake.  I encouraged her to follow-up with her primary care provider for laboratory recheck to ensure improvement of her renal function.   Despite the COVID-19 pneumonia and worsening renal function, patient's vital signs are stable and she was able to ambulate without any significant increased WOB or hypoxia.  Recommending that she obtain pulse oximetry.  Reached out to [facility] infusion center.   All of the evaluation and work-up results were discussed with the patient and any family at bedside.  Patient and/or family were informed that while patient is appropriate for discharge at this time, some medical emergencies may only develop or become detectable after a period  of time.  I specifically instructed patient and/or family to return to return to the ED or seek immediate medical attention for any new or worsening symptoms.  They were provided opportunity to ask any additional questions and have none at this time.  Prior to discharge patient is feeling well, agreeable with plan for discharge home.  They have expressed understanding of verbal discharge instructions as well as return precautions and are agreeable to the plan.   Veronica Simmons was evaluated in Emergency Department on 07/30/2020 for the symptoms described in the history of present illness. She was evaluated in the context of the global COVID-19 pandemic, which necessitated consideration that the patient might be at risk for infection with the SARS-CoV-2 virus that causes COVID-19. Institutional protocols and algorithms that pertain to the evaluation of patients at risk for COVID-19 are in a state of rapid change based on information released by regulatory bodies including the CDC and federal and state  organizations. These policies and algorithms were followed during the patient's care in the ED.   Final Clinical Impression(s) / ED Diagnoses Final diagnoses:  Pneumonia due to COVID-19 virus    Rx / DC Orders ED Discharge Orders         Ordered    azithromycin (ZITHROMAX Z-PAK) 250 MG tablet     Discontinue  Reprint     07/30/20 1926           Corena Herter, PA-C 07/30/20 2215    Truddie Hidden, MD 07/30/20 2238

## 2020-07-30 NOTE — Discharge Instructions (Addendum)
Please read the attachment on COVID-19.    Please check your temperature regularly and take Tylenol or ibuprofen as needed for fever control.  Your kidney function was worse than normal.  I suspect that this is due to diminished food intake.  Please also drink plenty of fluids as I suspect that you are dehydrated here today.  Given your history of diabetes, you are at high risk given your COVID-19 pneumonia and I believe that you would benefit from [facility] infusions.  I have reached out to the [facility] infusion clinic will call you to schedule an appointment.    Please return to the ED or seek immediate medical attention should you experience any new or worsening symptoms.

## 2020-07-30 NOTE — ED Triage Notes (Signed)
Patient c/o cough, sore throat, chills, and a runny nose x 5 days

## 2020-07-30 NOTE — ED Notes (Signed)
Pt ambulated around room with pulse ox and SpO2 dropped to 92% at the lowest, but quickly increases to 94-95% which is baseline.

## 2020-07-31 ENCOUNTER — Telehealth: Payer: Self-pay | Admitting: Physician Assistant

## 2020-07-31 NOTE — Telephone Encounter (Signed)
Called to discuss with patient about Covid symptoms and the use of bamlanivimab/etesevimab or casirivimab/imdevimab, a monoclonal antibody infusion for those with mild to moderate Covid symptoms and at a high risk of hospitalization.  Pt is qualified for this infusion at the [facility] infusion center due to Age > 69, BMI >25, HTN .  Message left to call back our hotline [phone] and sent a mychart message.   Angelena Form PA-C  [facility]

## 2020-08-02 ENCOUNTER — Other Ambulatory Visit: Payer: Self-pay

## 2020-08-02 ENCOUNTER — Emergency Department (HOSPITAL_COMMUNITY): Payer: Medicare Other

## 2020-08-02 ENCOUNTER — Encounter (HOSPITAL_COMMUNITY): Payer: Self-pay | Admitting: Emergency Medicine

## 2020-08-02 ENCOUNTER — Inpatient Hospital Stay (HOSPITAL_COMMUNITY)
Admission: EM | Admit: 2020-08-02 | Discharge: 2020-08-09 | DRG: 177 | Disposition: A | Discharge status: Home Health | Payer: Medicare Other | Attending: Internal Medicine | Admitting: Internal Medicine

## 2020-08-02 DIAGNOSIS — U071 COVID-19: Secondary | ICD-10-CM | POA: Diagnosis not present

## 2020-08-02 DIAGNOSIS — I130 Hypertensive heart and chronic kidney disease with heart failure and stage 1 through stage 4 chronic kidney disease, or unspecified chronic kidney disease: Secondary | ICD-10-CM | POA: Diagnosis present

## 2020-08-02 DIAGNOSIS — Z79899 Other long term (current) drug therapy: Secondary | ICD-10-CM

## 2020-08-02 DIAGNOSIS — E669 Obesity, unspecified: Secondary | ICD-10-CM | POA: Diagnosis present

## 2020-08-02 DIAGNOSIS — R0902 Hypoxemia: Secondary | ICD-10-CM

## 2020-08-02 DIAGNOSIS — Z881 Allergy status to other antibiotic agents status: Secondary | ICD-10-CM

## 2020-08-02 DIAGNOSIS — N170 Acute kidney failure with tubular necrosis: Secondary | ICD-10-CM | POA: Diagnosis present

## 2020-08-02 DIAGNOSIS — E1122 Type 2 diabetes mellitus with diabetic chronic kidney disease: Secondary | ICD-10-CM | POA: Diagnosis present

## 2020-08-02 DIAGNOSIS — Z833 Family history of diabetes mellitus: Secondary | ICD-10-CM

## 2020-08-02 DIAGNOSIS — Z6838 Body mass index (BMI) 38.0-38.9, adult: Secondary | ICD-10-CM

## 2020-08-02 DIAGNOSIS — E872 Acidosis: Secondary | ICD-10-CM | POA: Diagnosis present

## 2020-08-02 DIAGNOSIS — T380X5A Adverse effect of glucocorticoids and synthetic analogues, initial encounter: Secondary | ICD-10-CM | POA: Diagnosis present

## 2020-08-02 DIAGNOSIS — E871 Hypo-osmolality and hyponatremia: Secondary | ICD-10-CM | POA: Diagnosis present

## 2020-08-02 DIAGNOSIS — N182 Chronic kidney disease, stage 2 (mild): Secondary | ICD-10-CM | POA: Diagnosis present

## 2020-08-02 DIAGNOSIS — Z7984 Long term (current) use of oral hypoglycemic drugs: Secondary | ICD-10-CM

## 2020-08-02 DIAGNOSIS — R809 Proteinuria, unspecified: Secondary | ICD-10-CM | POA: Diagnosis present

## 2020-08-02 DIAGNOSIS — E1140 Type 2 diabetes mellitus with diabetic neuropathy, unspecified: Secondary | ICD-10-CM

## 2020-08-02 DIAGNOSIS — E860 Dehydration: Secondary | ICD-10-CM | POA: Diagnosis present

## 2020-08-02 DIAGNOSIS — E785 Hyperlipidemia, unspecified: Secondary | ICD-10-CM | POA: Diagnosis present

## 2020-08-02 DIAGNOSIS — Z8249 Family history of ischemic heart disease and other diseases of the circulatory system: Secondary | ICD-10-CM

## 2020-08-02 DIAGNOSIS — E1165 Type 2 diabetes mellitus with hyperglycemia: Secondary | ICD-10-CM | POA: Diagnosis present

## 2020-08-02 DIAGNOSIS — R630 Anorexia: Secondary | ICD-10-CM | POA: Diagnosis present

## 2020-08-02 DIAGNOSIS — R0602 Shortness of breath: Secondary | ICD-10-CM | POA: Diagnosis not present

## 2020-08-02 DIAGNOSIS — J9601 Acute respiratory failure with hypoxia: Secondary | ICD-10-CM | POA: Diagnosis present

## 2020-08-02 DIAGNOSIS — I5032 Chronic diastolic (congestive) heart failure: Secondary | ICD-10-CM | POA: Diagnosis present

## 2020-08-02 DIAGNOSIS — N179 Acute kidney failure, unspecified: Secondary | ICD-10-CM | POA: Diagnosis present

## 2020-08-02 DIAGNOSIS — J1282 Pneumonia due to coronavirus disease 2019: Secondary | ICD-10-CM | POA: Diagnosis present

## 2020-08-02 NOTE — ED Provider Notes (Signed)
[facility] DEPARTMENT Provider Note   CSN: [id number] Arrival date & time: 08/02/20  2251     History Chief Complaint  Patient presents with  . COVID positive  . Shortness of Breath    Veronica Simmons is a 69 y.o. female.  HPI     This is a 69 year old female with a history of anemia, diabetes, hypertension who presents with shortness of breath.  Was diagnosed with COVID-19 on 8/13.  She believes she started having symptoms within the last week.  Over the last several days she has developed a cough and increasing shortness of breath.  No chest pain, abdominal pain, nausea, vomiting, diarrhea.  She has had low-grade temperature and chills.  She does report that she received 1 dose of COVID-19 vaccination in July.  Past Medical History:  Diagnosis Date  . ALLERGIC RHINITIS 02/19/2007   Qualifier: Diagnosis of  By: Lorne Skeens MD, VALENICA    . Allergy    seasonal  . Anemia 1970s   on iron   . Arthritis   . Cubital tunnel syndrome on right 2020   Secondary to old fracture. Evaluated by ortho. [facility] offered surgery but patient refused. Numbness and burning pain 4th and 5th digit.    . Diabetes mellitus 2000   Never on insulin   . High cholesterol   . Hypertension 2000    Patient Active Problem List   Diagnosis Date Noted  . Pneumonia due to COVID-19 virus 08/03/2020  . New onset of congestive heart failure ([place]) 10/17/2019  . Hypertensive urgency 10/17/2019  . Right sided weakness 03/07/2018  . HTN (hypertension) 03/07/2018  . Obesity (BMI 30-39.9) 03/07/2018  . Skin papules, generalized 08/04/2016  . Skin lesion of right lower limb 06/19/2016  . Leg swelling 05/12/2016  . Subconjunctival hemorrhage of right eye 04/27/2016  . Diarrhea 01/09/2016  . Health care maintenance 06/28/2015  . Anemia 12/21/2014  . Palpitations 01/07/2014  . Recurrent yeast vaginitis 11/05/2013  . Recurrent genital herpes 07/17/2013  .  Hot flashes not due to menopause 11/27/2012  . Cubital tunnel syndrome on right 10/31/2012  . Type 2 diabetes, controlled, with neuropathy ([place]) 02/14/2007  . HYPERLIPIDEMIA 02/14/2007  . OBESITY, NOS 02/14/2007  . HYPERTENSION, BENIGN SYSTEMIC 02/14/2007  . ARTHRITIS 02/14/2007  . Insomnia 02/14/2007    Past Surgical History:  Procedure Laterality Date  . DG HAND RIGHT COMPLETE ([place] [facility]) Right 2020   right hand/wrist/elbow surgery to fix carpel tunnel/pinched nerve     OB History   No obstetric history on file.     Family History  Problem Relation Age of Onset  . Early death Mother 32       shot   . Heart disease Father 65  . Diabetes Daughter   . Kidney disease Daughter        on dialysis   . Colon polyps Daughter   . Diabetes Maternal Aunt   . Cancer Paternal Uncle        Breast Cancer   . Breast cancer Maternal Grandmother   . Colon cancer Neg Hx   . Esophageal cancer Neg Hx   . Stomach cancer Neg Hx   . Rectal cancer Neg Hx     Social History   Tobacco Use  . Smoking status: Never Smoker  . Smokeless tobacco: Never Used  Vaping Use  . Vaping Use: Never used  Substance Use Topics  . Alcohol use: No  . Drug use: No  Home Medications Prior to Admission medications   Medication Sig Start Date End Date Taking? Authorizing Provider  amLODipine (NORVASC) 10 MG tablet Take 1 tablet (10 mg total) by mouth daily. 10/18/19   Bonnell Public, MD  azithromycin (ZITHROMAX Z-PAK) 250 MG tablet 500 mg PO Day 1 250 mg PO Day 2-5 07/30/20   Krista Blue L, PA-C  beta carotene 15 MG capsule Take 1 capsule by mouth daily.    [provider]  carvedilol (COREG) 3.125 MG tablet Take 1 tablet (3.125 mg total) by mouth 2 (two) times daily with a meal. 10/18/19   Bonnell Public, MD  chlorthalidone (HYGROTON) 25 MG tablet Take 1 tablet by mouth daily. 02/18/20   [provider]  Coenzyme Q10 (COQ10 PO) Take 1 tablet by mouth daily.    [provider]  ferrous sulfate 325 (65 FE) MG tablet Take 1 tablet by mouth daily.    [provider]  furosemide (LASIX) 20 MG tablet Take 1 tablet (20 mg total) by mouth 2 (two) times daily as needed for fluid or edema. 10/18/19   Dana Allan I, MD  glucose blood (ACCU-CHEK AVIVA PLUS) test strip Use as instructed 01/06/16   Marina Goodell A, MD  glucose blood (ACCU-CHEK SMARTVIEW) test strip Test blood sugar once daily 10/12/16   Haney, Alyssa A, MD  glucose blood test strip Use as instructed 12/29/15   Haney, Alyssa A, MD  JANUMET 50-1000 MG tablet TAKE 1 TABLET BY MOUTH TWICE DAILY WITH MEALS Patient taking differently: Take 1 tablet by mouth daily.  07/14/16   Veatrice Bourbon, MD  Lancets (ONETOUCH ULTRASOFT) lancets Use as instructed 08/27/15   Haney, Yetta Flock A, MD  lisinopril (ZESTRIL) 40 MG tablet Take 1 tablet (40 mg total) by mouth daily. 10/18/19   Bonnell Public, MD  losartan (COZAAR) 100 MG tablet Take 100 mg by mouth daily. 04/27/20   [provider]  Magnesium 250 MG TABS Take 1 tablet by mouth daily.    [provider]  traZODone (DESYREL) 100 MG tablet Take 200 mg by mouth at bedtime as needed. 03/18/20   [provider]  vitamin C (ASCORBIC ACID) 500 MG tablet Take 500 mg by mouth daily.    [provider]  vitamin E 1000 UNIT capsule Take 1 capsule by mouth daily.    [provider]  Vitamins/Minerals TABS Take 1 tablet by mouth daily.    [provider]    Allergies    Levofloxacin  Review of Systems   Review of Systems  Constitutional: Positive for chills and fever.  Respiratory: Positive for cough and shortness of breath.   Cardiovascular: Negative for chest pain.  Gastrointestinal: Negative for abdominal pain, diarrhea and vomiting.  Genitourinary: Negative for dysuria.  All other systems reviewed and are negative.   Physical Exam Updated Vital Signs BP (!) 148/61   Pulse 84   Temp 100.1 F  (37.8 C) (Oral)   Resp (!) 34   SpO2 95%   Physical Exam Vitals and nursing note reviewed.  Constitutional:      Appearance: She is well-developed. She is not ill-appearing.  HENT:     Head: Normocephalic and atraumatic.     Mouth/Throat:     Mouth: Mucous membranes are moist.  Eyes:     Pupils: Pupils are equal, round, and reactive to light.  Cardiovascular:     Rate and Rhythm: Normal rate and regular rhythm.  Heart sounds: Normal heart sounds.  Pulmonary:     Effort: Pulmonary effort is normal. No respiratory distress.     Comments: 2 L nasal cannula Abdominal:     General: Bowel sounds are normal.     Palpations: Abdomen is soft.  Musculoskeletal:     Cervical back: Neck supple.     Right lower leg: No tenderness. No edema.     Left lower leg: No tenderness. No edema.  Skin:    General: Skin is warm and dry.  Neurological:     Mental Status: She is alert and oriented to person, place, and time.  Psychiatric:        Mood and Affect: Mood normal.     ED Results / Procedures / Treatments   Labs (all labs ordered are listed, but only abnormal results are displayed) Labs Reviewed  CBC WITH DIFFERENTIAL/PLATELET - Abnormal; Notable for the following components:      Result Value   RBC 3.21 (*)    Hemoglobin 9.1 (*)    HCT 28.5 (*)    All other components within normal limits  COMPREHENSIVE METABOLIC PANEL - Abnormal; Notable for the following components:   Sodium 126 (*)    Chloride 95 (*)    CO2 18 (*)    Glucose, Bld 301 (*)    BUN 50 (*)    Creatinine, Ser 4.15 (*)    Calcium 8.1 (*)    Total Protein 6.1 (*)    Albumin 2.0 (*)    AST 85 (*)    Alkaline Phosphatase 33 (*)    GFR calc non Af Amer 10 (*)    GFR calc Af Amer 12 (*)    All other components within normal limits  D-DIMER, QUANTITATIVE (NOT AT [facility]) - Abnormal; Notable for the following components:   D-Dimer, Quant 4.84 (*)    All other components within normal limits  LACTATE  DEHYDROGENASE - Abnormal; Notable for the following components:   LDH 675 (*)    All other components within normal limits  FERRITIN - Abnormal; Notable for the following components:   Ferritin 735 (*)    All other components within normal limits  TRIGLYCERIDES - Abnormal; Notable for the following components:   Triglycerides 296 (*)    All other components within normal limits  FIBRINOGEN - Abnormal; Notable for the following components:   Fibrinogen 759 (*)    All other components within normal limits  C-REACTIVE PROTEIN - Abnormal; Notable for the following components:   CRP 14.6 (*)    All other components within normal limits  CULTURE, BLOOD (ROUTINE X 2)  CULTURE, BLOOD (ROUTINE X 2)  LACTIC ACID, PLASMA  PROCALCITONIN  LACTIC ACID, PLASMA  URINALYSIS, ROUTINE W REFLEX MICROSCOPIC    EKG EKG Interpretation  Date/Time:  Monday August 02 2020 23:46:49 EDT Ventricular Rate:  80 PR Interval:    QRS Duration: 88 QT Interval:  385 QTC Calculation: 445 R Axis:   66 Text Interpretation: Sinus rhythm Confirmed by Thayer Jew [phone]) on 08/03/2020 1:03:14 AM   Radiology DG Chest Port 1 View  Result Date: 08/03/2020 CLINICAL DATA:  Shortness of breath.  COVID positive 3 days ago. EXAM: PORTABLE CHEST 1 VIEW COMPARISON:  Radiograph 07/30/2020 FINDINGS: New patchy opacities in both mid and lower lung zones from prior exam. The right upper lobe opacity is persistent, slightly increased. Borderline cardiomegaly is unchanged. Unchanged mediastinal contours. Lower lung volumes from prior. No pneumothorax or large pleural effusion. No  acute osseous abnormalities are seen. IMPRESSION: Progression in bilateral airspace disease over the past 3 days with new opacities in both mid lower lung zones. Pattern typical of COVID-19 pneumonia. Electronically Signed   By: Keith Rake M.D.   On: 08/03/2020 00:00    Procedures Procedures (including critical care time)  CRITICAL  CARE Performed by: Merryl Hacker   Total critical care time: 45 minutes  Critical care time was exclusive of separately billable procedures and treating other patients.  Critical care was necessary to treat or prevent imminent or life-threatening deterioration.  Critical care was time spent personally by me on the following activities: development of treatment plan with patient and/or surrogate as well as nursing, discussions with consultants, evaluation of patient's response to treatment, examination of patient, obtaining history from patient or surrogate, ordering and performing treatments and interventions, ordering and review of laboratory studies, ordering and review of radiographic studies, pulse oximetry and re-evaluation of patient's condition.   Medications Ordered in ED Medications  sodium chloride 0.9 % bolus 1,000 mL (1,000 mLs Intravenous New Bag/Given 08/03/20 0208)    ED Course  I have reviewed the triage vital signs and the nursing notes.  Pertinent labs & imaging results that were available during my care of the patient were reviewed by me and considered in my medical decision making (see chart for details).    MDM Rules/Calculators/A&P                           Patient presents with shortness of breath.  Known Covid positive.  She is requiring 3 L of oxygen but no respiratory distress.  Lab work obtained.  She is febrile with slight tachypnea.  Chest x-ray shows interval evolution of new bilateral airspace opacities.  Lab work notable for hyponatremia with a sodium 125 and AKI with a creatinine greater than 4.  Last creatinine 1.76.  Suspect some element of dehydration although other etiology is also possible.  Patient was given a liter of fluids for this reason.  Lactate is normal.  Doubt sepsis.  Plan for admission for FYBOF-75 complications.  Veronica Simmons was evaluated in Emergency Department on 08/03/2020 for the symptoms described in the history of  present illness. She was evaluated in the context of the global COVID-19 pandemic, which necessitated consideration that the patient might be at risk for infection with the SARS-CoV-2 virus that causes COVID-19. Institutional protocols and algorithms that pertain to the evaluation of patients at risk for COVID-19 are in a state of rapid change based on information released by regulatory bodies including the CDC and federal and state organizations. These policies and algorithms were followed during the patient's care in the ED.   Final Clinical Impression(s) / ED Diagnoses Final diagnoses:  Pneumonia due to COVID-19 virus  Hypoxia  AKI (acute kidney injury) ([place])  Hyponatremia    Rx / DC Orders ED Discharge Orders    None       Wynn Alldredge, Barbette Hair, MD 08/03/20 [phone]

## 2020-08-02 NOTE — ED Triage Notes (Addendum)
Pt presents to ED BIB [facility] from home. Pt c/o SOB and poor PO intake. Pt reports that she tested positive for COVID on 07/30/2020. Pt 86% on RA, placed on 2L [place] - 96%. Pt reports she got one dose of the vaccine. Pt reports noncompliance w/ home meds

## 2020-08-03 ENCOUNTER — Inpatient Hospital Stay (HOSPITAL_COMMUNITY): Payer: Medicare Other

## 2020-08-03 DIAGNOSIS — U071 COVID-19: Principal | ICD-10-CM

## 2020-08-03 DIAGNOSIS — T380X5A Adverse effect of glucocorticoids and synthetic analogues, initial encounter: Secondary | ICD-10-CM | POA: Diagnosis present

## 2020-08-03 DIAGNOSIS — Z833 Family history of diabetes mellitus: Secondary | ICD-10-CM | POA: Diagnosis not present

## 2020-08-03 DIAGNOSIS — J1282 Pneumonia due to coronavirus disease 2019: Secondary | ICD-10-CM | POA: Diagnosis present

## 2020-08-03 DIAGNOSIS — J9601 Acute respiratory failure with hypoxia: Secondary | ICD-10-CM | POA: Diagnosis present

## 2020-08-03 DIAGNOSIS — I5032 Chronic diastolic (congestive) heart failure: Secondary | ICD-10-CM | POA: Diagnosis present

## 2020-08-03 DIAGNOSIS — R0602 Shortness of breath: Secondary | ICD-10-CM | POA: Diagnosis present

## 2020-08-03 DIAGNOSIS — E785 Hyperlipidemia, unspecified: Secondary | ICD-10-CM | POA: Diagnosis present

## 2020-08-03 DIAGNOSIS — Z6838 Body mass index (BMI) 38.0-38.9, adult: Secondary | ICD-10-CM | POA: Diagnosis not present

## 2020-08-03 DIAGNOSIS — E872 Acidosis: Secondary | ICD-10-CM | POA: Diagnosis present

## 2020-08-03 DIAGNOSIS — E871 Hypo-osmolality and hyponatremia: Secondary | ICD-10-CM | POA: Diagnosis present

## 2020-08-03 DIAGNOSIS — Z8249 Family history of ischemic heart disease and other diseases of the circulatory system: Secondary | ICD-10-CM | POA: Diagnosis not present

## 2020-08-03 DIAGNOSIS — Z7984 Long term (current) use of oral hypoglycemic drugs: Secondary | ICD-10-CM | POA: Diagnosis not present

## 2020-08-03 DIAGNOSIS — Z881 Allergy status to other antibiotic agents status: Secondary | ICD-10-CM | POA: Diagnosis not present

## 2020-08-03 DIAGNOSIS — N170 Acute kidney failure with tubular necrosis: Secondary | ICD-10-CM | POA: Diagnosis present

## 2020-08-03 DIAGNOSIS — E1122 Type 2 diabetes mellitus with diabetic chronic kidney disease: Secondary | ICD-10-CM | POA: Diagnosis present

## 2020-08-03 DIAGNOSIS — Z79899 Other long term (current) drug therapy: Secondary | ICD-10-CM | POA: Diagnosis not present

## 2020-08-03 DIAGNOSIS — N179 Acute kidney failure, unspecified: Secondary | ICD-10-CM | POA: Diagnosis present

## 2020-08-03 DIAGNOSIS — E1165 Type 2 diabetes mellitus with hyperglycemia: Secondary | ICD-10-CM | POA: Diagnosis present

## 2020-08-03 DIAGNOSIS — E669 Obesity, unspecified: Secondary | ICD-10-CM | POA: Diagnosis present

## 2020-08-03 DIAGNOSIS — I130 Hypertensive heart and chronic kidney disease with heart failure and stage 1 through stage 4 chronic kidney disease, or unspecified chronic kidney disease: Secondary | ICD-10-CM | POA: Diagnosis present

## 2020-08-03 DIAGNOSIS — R630 Anorexia: Secondary | ICD-10-CM | POA: Diagnosis present

## 2020-08-03 DIAGNOSIS — E860 Dehydration: Secondary | ICD-10-CM | POA: Diagnosis present

## 2020-08-03 DIAGNOSIS — R809 Proteinuria, unspecified: Secondary | ICD-10-CM | POA: Diagnosis present

## 2020-08-03 DIAGNOSIS — N182 Chronic kidney disease, stage 2 (mild): Secondary | ICD-10-CM | POA: Diagnosis present

## 2020-08-03 LAB — PROCALCITONIN
Procalcitonin: 0.53 ng/mL
Procalcitonin: 0.65 ng/mL

## 2020-08-03 LAB — LACTIC ACID, PLASMA
Lactic Acid, Venous: 1.9 mmol/L (ref 0.5–1.9)
Lactic Acid, Venous: 1.5 mmol/L (ref 0.5–1.9)

## 2020-08-03 LAB — COMPREHENSIVE METABOLIC PANEL
ALT: 29 U/L (ref 0–44)
AST: 85 U/L — ABNORMAL HIGH (ref 15–41)
Albumin: 2 g/dL — ABNORMAL LOW (ref 3.5–5.0)
Alkaline Phosphatase: 33 U/L — ABNORMAL LOW (ref 38–126)
Anion gap: 13 (ref 5–15)
BUN: 50 mg/dL — ABNORMAL HIGH (ref 8–23)
CO2: 18 mmol/L — ABNORMAL LOW (ref 22–32)
Calcium: 8.1 mg/dL — ABNORMAL LOW (ref 8.9–10.3)
Chloride: 95 mmol/L — ABNORMAL LOW (ref 98–111)
Creatinine, Ser: 4.15 mg/dL — ABNORMAL HIGH (ref 0.44–1.00)
GFR calc Af Amer: 12 mL/min — ABNORMAL LOW (ref >60)
GFR calc non Af Amer: 10 mL/min — ABNORMAL LOW (ref >60)
Glucose, Bld: 301 mg/dL — ABNORMAL HIGH (ref 70–99)
Potassium: 4.4 mmol/L (ref 3.5–5.1)
Sodium: 126 mmol/L — ABNORMAL LOW (ref 135–145)
Total Bilirubin: 0.7 mg/dL (ref 0.3–1.2)
Total Protein: 6.1 g/dL — ABNORMAL LOW (ref 6.5–8.1)

## 2020-08-03 LAB — CBC WITH DIFFERENTIAL/PLATELET
Abs Immature Granulocytes: 0.03 10*3/uL (ref 0.00–0.07)
Basophils Absolute: 0 10*3/uL (ref 0.0–0.1)
Basophils Relative: 0 %
Eosinophils Absolute: 0 10*3/uL (ref 0.0–0.5)
Eosinophils Relative: 0 %
HCT: 28.5 % — ABNORMAL LOW (ref 36.0–46.0)
Hemoglobin: 9.1 g/dL — ABNORMAL LOW (ref 12.0–15.0)
Immature Granulocytes: 1 %
Lymphocytes Relative: 16 %
Lymphs Abs: 1 10*3/uL (ref 0.7–4.0)
MCH: 28.3 pg (ref 26.0–34.0)
MCHC: 31.9 g/dL (ref 30.0–36.0)
MCV: 88.8 fL (ref 80.0–100.0)
Monocytes Absolute: 0.2 10*3/uL (ref 0.1–1.0)
Monocytes Relative: 3 %
Neutro Abs: 5 10*3/uL (ref 1.7–7.7)
Neutrophils Relative %: 80 %
Platelets: 305 10*3/uL (ref 150–400)
RBC: 3.21 MIL/uL — ABNORMAL LOW (ref 3.87–5.11)
RDW: 13.5 % (ref 11.5–15.5)
WBC: 6.3 10*3/uL (ref 4.0–10.5)
nRBC: 0 % (ref 0.0–0.2)

## 2020-08-03 LAB — OSMOLALITY: Osmolality: 299 mosm/kg — ABNORMAL HIGH (ref 275–295)

## 2020-08-03 LAB — URINALYSIS, ROUTINE W REFLEX MICROSCOPIC
Bilirubin Urine: NEGATIVE
Glucose, UA: >=500 mg/dL — AB
Ketones, ur: NEGATIVE mg/dL
Leukocytes,Ua: NEGATIVE
Nitrite: NEGATIVE
Protein, ur: >=300 mg/dL — AB
Specific Gravity, Urine: 1.022 (ref 1.005–1.030)
pH: 5 (ref 5.0–8.0)

## 2020-08-03 LAB — CBG MONITORING, ED
Glucose-Capillary: 339 mg/dL — ABNORMAL HIGH (ref 70–99)
Glucose-Capillary: 385 mg/dL — ABNORMAL HIGH (ref 70–99)
Glucose-Capillary: 303 mg/dL — ABNORMAL HIGH (ref 70–99)
Glucose-Capillary: 261 mg/dL — ABNORMAL HIGH (ref 70–99)

## 2020-08-03 LAB — HEMOGLOBIN A1C
Hgb A1c MFr Bld: 8.4 % — ABNORMAL HIGH (ref 4.8–5.6)
Mean Plasma Glucose: 194.38 mg/dL

## 2020-08-03 LAB — C-REACTIVE PROTEIN: CRP: 14.6 mg/dL — ABNORMAL HIGH (ref <1.0)

## 2020-08-03 LAB — TRIGLYCERIDES: Triglycerides: 296 mg/dL — ABNORMAL HIGH (ref <150)

## 2020-08-03 LAB — D-DIMER, QUANTITATIVE: D-Dimer, Quant: 4.84 ug/mL-FEU — ABNORMAL HIGH (ref 0.00–0.50)

## 2020-08-03 LAB — LACTATE DEHYDROGENASE: LDH: 675 U/L — ABNORMAL HIGH (ref 98–192)

## 2020-08-03 LAB — SODIUM, URINE, RANDOM: Sodium, Ur: 11 mmol/L

## 2020-08-03 LAB — FERRITIN: Ferritin: 735 ng/mL — ABNORMAL HIGH (ref 11–307)

## 2020-08-03 LAB — CREATININE, URINE, RANDOM: Creatinine, Urine: 83.79 mg/dL

## 2020-08-03 LAB — ABO/RH: ABO/RH(D): A POS

## 2020-08-03 LAB — FIBRINOGEN: Fibrinogen: 759 mg/dL — ABNORMAL HIGH (ref 210–475)

## 2020-08-03 MED ORDER — INSULIN ASPART 100 UNIT/ML ~~LOC~~ SOLN
0.0000 [IU] | Freq: Every day | SUBCUTANEOUS | Status: DC
  Administered 2020-08-04: 3 [IU] via SUBCUTANEOUS
  Administered 2020-08-04: 4 [IU] via SUBCUTANEOUS
  Administered 2020-08-05 – 2020-08-07 (×2): 3 [IU] via SUBCUTANEOUS
  Administered 2020-08-08: 2 [IU] via SUBCUTANEOUS

## 2020-08-03 MED ORDER — VITAMIN D 25 MCG (1000 UNIT) PO TABS
1000.0000 [IU] | ORAL_TABLET | Freq: Every day | ORAL | Status: DC
  Administered 2020-08-03 – 2020-08-09 (×7): 1000 [IU] via ORAL
  Filled 2020-08-03 (×7): qty 1

## 2020-08-03 MED ORDER — SODIUM CHLORIDE 0.9 % IV SOLN
1.0000 g | Freq: Every day | INTRAVENOUS | Status: DC
  Administered 2020-08-03 – 2020-08-04 (×3): 1 g via INTRAVENOUS
  Filled 2020-08-03 (×3): qty 10

## 2020-08-03 MED ORDER — ZINC SULFATE 220 (50 ZN) MG PO CAPS
220.0000 mg | ORAL_CAPSULE | Freq: Every day | ORAL | Status: DC
  Administered 2020-08-03 – 2020-08-09 (×7): 220 mg via ORAL
  Filled 2020-08-03 (×7): qty 1

## 2020-08-03 MED ORDER — ASCORBIC ACID 500 MG PO TABS
500.0000 mg | ORAL_TABLET | Freq: Every day | ORAL | Status: DC
  Administered 2020-08-03 – 2020-08-09 (×7): 500 mg via ORAL
  Filled 2020-08-03 (×7): qty 1

## 2020-08-03 MED ORDER — SODIUM CHLORIDE 0.9 % IV SOLN
500.0000 mg | Freq: Every day | INTRAVENOUS | Status: DC
  Administered 2020-08-03 – 2020-08-05 (×3): 500 mg via INTRAVENOUS
  Filled 2020-08-03 (×4): qty 500

## 2020-08-03 MED ORDER — HEPARIN SODIUM (PORCINE) 5000 UNIT/ML IJ SOLN
7500.0000 [IU] | Freq: Three times a day (TID) | INTRAMUSCULAR | Status: DC
  Administered 2020-08-03 – 2020-08-09 (×19): 7500 [IU] via SUBCUTANEOUS
  Filled 2020-08-03 (×19): qty 2

## 2020-08-03 MED ORDER — SODIUM CHLORIDE 0.9 % IV SOLN
100.0000 mg | Freq: Every day | INTRAVENOUS | Status: AC
  Administered 2020-08-04 – 2020-08-07 (×4): 100 mg via INTRAVENOUS
  Filled 2020-08-03 (×5): qty 20

## 2020-08-03 MED ORDER — ACETAMINOPHEN 325 MG PO TABS
650.0000 mg | ORAL_TABLET | Freq: Four times a day (QID) | ORAL | Status: DC | PRN
  Administered 2020-08-04: 650 mg via ORAL
  Filled 2020-08-03: qty 2

## 2020-08-03 MED ORDER — HYDROCOD POLST-CPM POLST ER 10-8 MG/5ML PO SUER
5.0000 mL | Freq: Two times a day (BID) | ORAL | Status: DC | PRN
  Administered 2020-08-05 – 2020-08-06 (×2): 5 mL via ORAL
  Filled 2020-08-03 (×2): qty 5

## 2020-08-03 MED ORDER — LACTATED RINGERS IV SOLN: INTRAVENOUS | Status: DC

## 2020-08-03 MED ORDER — SODIUM CHLORIDE 0.9 % IV BOLUS
1000.0000 mL | Freq: Once | INTRAVENOUS | Status: AC
  Administered 2020-08-03: 1000 mL via INTRAVENOUS

## 2020-08-03 MED ORDER — GUAIFENESIN-DM 100-10 MG/5ML PO SYRP
10.0000 mL | ORAL_SOLUTION | ORAL | Status: DC | PRN
  Administered 2020-08-05 – 2020-08-07 (×3): 10 mL via ORAL
  Filled 2020-08-03 (×3): qty 10

## 2020-08-03 MED ORDER — SODIUM CHLORIDE 0.9 % IV SOLN: INTRAVENOUS | Status: DC

## 2020-08-03 MED ORDER — INSULIN ASPART 100 UNIT/ML ~~LOC~~ SOLN
0.0000 [IU] | Freq: Three times a day (TID) | SUBCUTANEOUS | Status: DC
  Administered 2020-08-03: 11 [IU] via SUBCUTANEOUS
  Administered 2020-08-03: 15 [IU] via SUBCUTANEOUS
  Administered 2020-08-03 – 2020-08-04 (×3): 11 [IU] via SUBCUTANEOUS
  Administered 2020-08-04: 8 [IU] via SUBCUTANEOUS
  Administered 2020-08-05: 5 [IU] via SUBCUTANEOUS
  Administered 2020-08-05: 11 [IU] via SUBCUTANEOUS
  Administered 2020-08-05: 8 [IU] via SUBCUTANEOUS
  Administered 2020-08-06: 3 [IU] via SUBCUTANEOUS
  Administered 2020-08-06: 2 [IU] via SUBCUTANEOUS
  Administered 2020-08-06: 3 [IU] via SUBCUTANEOUS
  Administered 2020-08-07 (×2): 8 [IU] via SUBCUTANEOUS
  Administered 2020-08-07: 11 [IU] via SUBCUTANEOUS
  Administered 2020-08-08 (×2): 3 [IU] via SUBCUTANEOUS
  Administered 2020-08-08 – 2020-08-09 (×2): 2 [IU] via SUBCUTANEOUS

## 2020-08-03 MED ORDER — SODIUM CHLORIDE 0.9 % IV SOLN
200.0000 mg | Freq: Once | INTRAVENOUS | Status: AC
  Administered 2020-08-03: 200 mg via INTRAVENOUS
  Filled 2020-08-03: qty 200

## 2020-08-03 MED ORDER — METHYLPREDNISOLONE SODIUM SUCC 125 MG IJ SOLR
50.0000 mg | Freq: Two times a day (BID) | INTRAMUSCULAR | Status: DC
  Administered 2020-08-03 – 2020-08-07 (×10): 50 mg via INTRAVENOUS
  Filled 2020-08-03 (×10): qty 2

## 2020-08-03 NOTE — ED Notes (Signed)
Pt's daughter, Simonne Martinet, updated

## 2020-08-03 NOTE — ED Notes (Signed)
Ordered breakfast 

## 2020-08-03 NOTE — ED Notes (Signed)
Incentive spirometer and flutter valve given to patient with education. Verbalized understanding.

## 2020-08-03 NOTE — Progress Notes (Signed)
[facility] PLAN OF CARE NOTE Patient: Veronica Simmons NGI:[id number]   PCP: Katherina Mires, MD DOB: Oct 17, 1951   DOA: 08/02/2020   DOS: 08/03/2020    Patient was admitted by my colleague earlier on 08/03/2020. I have reviewed the H&P as well as assessment and plan and agree with the same. Important changes in the plan are listed below.  Plan of care: Principal Problem:   Pneumonia due to COVID-19 virus Active Problems:   Acute respiratory failure with hypoxemia (H[facility])   Sepsis ([place])   AKI (acute kidney injury) ([place])   Hyponatremia    Has some confusion.  No focal deficit.  Renal function still elevated.  Monitor.  Author: Berle Mull, MD [facility] 08/03/2020 7:34 PM   If 7PM-7AM, please contact night-coverage at [email or web address]

## 2020-08-03 NOTE — H&P (Signed)
History and Physical    Veronica Simmons VHQ:[id number] DOB: 19-Feb-1951 DOA: 08/02/2020  PCP: Katherina Mires, MD Patient coming from: Home  Chief Complaint: SOB, Covid +  HPI: Veronica Simmons is a 69 y.o. female with medical history significant of HTN, HLD, DM, anemia, chronic diastolic CHF presenting with complaints of SOB and poor oral intake. She was seen in ED on 8/13 and tested positive for covid at that time.  Patient is complaining of shortness of breath, chills, and poor appetite.  Unclear when these symptoms started.  Denies fevers, cough, nausea, vomiting, abdominal pain, or diarrhea.  States she received 1 dose of the Covid vaccine sometime this month and she is not sure about the exact date.  No additional history could be obtained from her.  ED Course: Temp 100.5 F. O2 saturation 86% on RA, improved to 96% with 2L supplemental O2. Labs showing no leukocytosis. Procalcitonin pending. Hgb 9.1, baseline above 10. Lactic acid normal. Corrected sodium 129. Bicarb 18, anion gap 13. BUN 50, Cr 4.15. Baseline Cr 0.9. AST 85, ALT 29, ALP 33, Tbili 0.7. D-dimer 4.84, LDH 675, ferritin 735, triglycerides 296, fibrinogen 759, and CRP 14.6. UA pending. Blood culture x2 pending.   CXR showing progression of bilateral airspace disease since 8/13 with new opacities in both mid lower lung zones.   Patient was given 1L NS bolus.   Review of Systems:  All systems reviewed and apart from history of presenting illness, are negative.  Past Medical History:  Diagnosis Date  . ALLERGIC RHINITIS 02/19/2007   Qualifier: Diagnosis of  By: Lorne Skeens MD, VALENICA    . Allergy    seasonal  . Anemia 1970s   on iron   . Arthritis   . Cubital tunnel syndrome on right 2020   Secondary to old fracture. Evaluated by ortho. [facility] offered surgery but patient refused. Numbness and burning pain 4th and 5th digit.    . Diabetes mellitus 2000   Never on insulin   . High  cholesterol   . Hypertension 2000    Past Surgical History:  Procedure Laterality Date  . DG HAND RIGHT COMPLETE ([place] HX) Right 2020   right hand/wrist/elbow surgery to fix carpel tunnel/pinched nerve     reports that she has never smoked. She has never used smokeless tobacco. She reports that she does not drink alcohol and does not use drugs.  Allergies  Allergen Reactions  . Levofloxacin Hives    Family History  Problem Relation Age of Onset  . Early death Mother 64       shot   . Heart disease Father 62  . Diabetes Daughter   . Kidney disease Daughter        on dialysis   . Colon polyps Daughter   . Diabetes Maternal Aunt   . Cancer Paternal Uncle        Breast Cancer   . Breast cancer Maternal Grandmother   . Colon cancer Neg Hx   . Esophageal cancer Neg Hx   . Stomach cancer Neg Hx   . Rectal cancer Neg Hx     Prior to Admission medications   Medication Sig Start Date End Date Taking? Authorizing Provider  amLODipine (NORVASC) 10 MG tablet Take 1 tablet (10 mg total) by mouth daily. 10/18/19   Bonnell Public, MD  azithromycin (ZITHROMAX Z-PAK) 250 MG tablet 500 mg PO Day 1 250 mg PO Day 2-5 07/30/20   Corena Herter, PA-C  beta carotene 15 MG capsule Take 1 capsule by mouth daily.    [provider]  carvedilol (COREG) 3.125 MG tablet Take 1 tablet (3.125 mg total) by mouth 2 (two) times daily with a meal. 10/18/19   Bonnell Public, MD  chlorthalidone (HYGROTON) 25 MG tablet Take 1 tablet by mouth daily. 02/18/20   [provider]  Coenzyme Q10 (COQ10 PO) Take 1 tablet by mouth daily.    [provider]  ferrous sulfate 325 (65 FE) MG tablet Take 1 tablet by mouth daily.    [provider]  furosemide (LASIX) 20 MG tablet Take 1 tablet (20 mg total) by mouth 2 (two) times daily as needed for fluid or edema. 10/18/19   Dana Allan I, MD  glucose blood (ACCU-CHEK AVIVA PLUS) test strip Use as instructed 01/06/16    Marina Goodell A, MD  glucose blood (ACCU-CHEK SMARTVIEW) test strip Test blood sugar once daily 10/12/16   Haney, Alyssa A, MD  glucose blood test strip Use as instructed 12/29/15   Haney, Alyssa A, MD  JANUMET 50-1000 MG tablet TAKE 1 TABLET BY MOUTH TWICE DAILY WITH MEALS Patient taking differently: Take 1 tablet by mouth daily.  07/14/16   Veatrice Bourbon, MD  Lancets (ONETOUCH ULTRASOFT) lancets Use as instructed 08/27/15   Haney, Yetta Flock A, MD  lisinopril (ZESTRIL) 40 MG tablet Take 1 tablet (40 mg total) by mouth daily. 10/18/19   Bonnell Public, MD  losartan (COZAAR) 100 MG tablet Take 100 mg by mouth daily. 04/27/20   [provider]  Magnesium 250 MG TABS Take 1 tablet by mouth daily.    [provider]  traZODone (DESYREL) 100 MG tablet Take 200 mg by mouth at bedtime as needed. 03/18/20   [provider]  vitamin C (ASCORBIC ACID) 500 MG tablet Take 500 mg by mouth daily.    [provider]  vitamin E 1000 UNIT capsule Take 1 capsule by mouth daily.    [provider]  Vitamins/Minerals TABS Take 1 tablet by mouth daily.    [provider]    Physical Exam: Vitals:   08/03/20 0100 08/03/20 0130 08/03/20 0200 08/03/20 0209  BP: (!) 148/63 (!) 145/64 (!) 148/61   Pulse: 78 79 84   Resp: (!) 27 (!) 22 (!) 34   Temp:    100.1 F (37.8 C)  TempSrc:    Oral  SpO2: 98% 96% 95%     Physical Exam Constitutional:      Appearance: She is not diaphoretic.     Comments: Appears lethargic  HENT:     Head: Normocephalic and atraumatic.  Eyes:     Extraocular Movements: Extraocular movements intact.     Conjunctiva/sclera: Conjunctivae normal.  Cardiovascular:     Rate and Rhythm: Normal rate and regular rhythm.     Pulses: Normal pulses.  Pulmonary:     Effort: Pulmonary effort is normal.     Breath sounds: No wheezing.     Comments: Bibasilar rales appreciated Satting in the mid to upper 90s on 2 L supplemental  oxygen Tachypneic with respiratory rate up to mid 20s Abdominal:     General: Bowel sounds are normal. There is no distension.     Palpations: Abdomen is soft.     Tenderness: There is no abdominal tenderness. There is no guarding.  Musculoskeletal:        General: No tenderness.     Cervical back: Normal range of motion  and neck supple.  Skin:    General: Skin is warm and dry.  Neurological:     General: No focal deficit present.     Mental Status: She is alert and oriented to person, place, and time.     Labs on Admission: I have personally reviewed following labs and imaging studies  CBC: Recent Labs  Lab 07/30/20 1756 08/02/20 2327  WBC 4.0 6.3  NEUTROABS 2.7 5.0  HGB 10.0* 9.1*  HCT 31.1* 28.5*  MCV 89.6 88.8  PLT 196 673   Basic Metabolic Panel: Recent Labs  Lab 07/30/20 1756 08/02/20 2327  NA 131* 126*  K 4.1 4.4  CL 98 95*  CO2 23 18*  GLUCOSE 295* 301*  BUN 28* 50*  CREATININE 1.76* 4.15*  CALCIUM 8.4* 8.1*   GFR: Estimated Creatinine Clearance: 14.9 mL/min (A) (by C-G formula based on SCr of 4.15 mg/dL (H)). Liver Function Tests: Recent Labs  Lab 08/02/20 2327  AST 85*  ALT 29  ALKPHOS 33*  BILITOT 0.7  PROT 6.1*  ALBUMIN 2.0*   No results for input(s): LIPASE, AMYLASE in the last 168 hours. No results for input(s): AMMONIA in the last 168 hours. Coagulation Profile: No results for input(s): INR, PROTIME in the last 168 hours. Cardiac Enzymes: No results for input(s): CKTOTAL, CKMB, CKMBINDEX, TROPONINI in the last 168 hours. BNP (last 3 results) No results for input(s): PROBNP in the last 8760 hours. HbA1C: No results for input(s): HGBA1C in the last 72 hours. CBG: No results for input(s): GLUCAP in the last 168 hours. Lipid Profile: Recent Labs    08/02/20 2327  TRIG 296*   Thyroid Function Tests: No results for input(s): TSH, T4TOTAL, FREET4, T3FREE, THYROIDAB in the last 72 hours. Anemia Panel: Recent Labs    08/02/20 2327   FERRITIN 735*   Urine analysis:    Component Value Date/Time   COLORURINE YELLOW 07/30/2020 2042   APPEARANCEUR HAZY (A) 07/30/2020 2042   LABSPEC 1.018 07/30/2020 2042   PHURINE 5.0 07/30/2020 2042   GLUCOSEU 150 (A) 07/30/2020 2042   HGBUR NEGATIVE 07/30/2020 2042   BILIRUBINUR NEGATIVE 07/30/2020 2042   BILIRUBINUR negative 07/17/2013 [place] 07/30/2020 2042   PROTEINUR >=300 (A) 07/30/2020 2042   UROBILINOGEN 1.0 07/17/2013 0946   UROBILINOGEN 1.0 05/27/2012 2055   NITRITE NEGATIVE 07/30/2020 2042   LEUKOCYTESUR TRACE (A) 07/30/2020 2042    Radiological Exams on Admission: DG Chest Port 1 View  Result Date: 08/03/2020 CLINICAL DATA:  Shortness of breath.  COVID positive 3 days ago. EXAM: PORTABLE CHEST 1 VIEW COMPARISON:  Radiograph 07/30/2020 FINDINGS: New patchy opacities in both mid and lower lung zones from prior exam. The right upper lobe opacity is persistent, slightly increased. Borderline cardiomegaly is unchanged. Unchanged mediastinal contours. Lower lung volumes from prior. No pneumothorax or large pleural effusion. No acute osseous abnormalities are seen. IMPRESSION: Progression in bilateral airspace disease over the past 3 days with new opacities in both mid lower lung zones. Pattern typical of COVID-19 pneumonia. Electronically Signed   By: Keith Rake M.D.   On: 08/03/2020 00:00    EKG: Independently reviewed. Sinus rhythm, no significant change since prior tracing.   Assessment/Plan Principal Problem:   Pneumonia due to COVID-19 virus Active Problems:   Acute respiratory failure with hypoxemia (HCC)   Sepsis (HCC)   AKI (acute kidney injury) ([place])   Hyponatremia   Acute hypoxemic respiratory failure and sepsis secondary to COVID-19 viral multifocal PNA: Status post  1 dose of covid vaccine. Tested positive for covid during ED visit on 8/13. Oxygen saturation 86% on RA, currently satting well on 2L supplemental O2. CXR showing  progression of bilateral airspace disease since 8/13 with new opacities in both mid lower lung zones. Febrile and tachypnic with respiratory rate up to mid 20s. Labs showing no leukocytosis. Procalcitonin elevated at 0.53.Marland Kitchen Inflammatory markers elevated - D-dimer 4.84, LDH 675, ferritin 735, triglycerides 296, fibrinogen 759, and CRP 14.6. -Remdesivir dosing per pharmacy -IV Solumedrol 50 mg q12 hrs -Ceftriaxone and azithromycin for coverage of possible bacterial pneumonia given elevated procalcitonin.  Continue to trend procalcitonin daily. -Baricitinib contraindicated given severe renal impairment (GFR <15) -Vitamin C, zinc, vitamin D -Antitussives as needed -Tylenol as needed -Daily CBC with differential, CMP, CRP, D-dimer -Airborne and contact precautions -Encourage self proning -Incentive spirometry, flutter valve -Cont pulse ox, supplemental oxygen as needed to keep oxygen saturation above 90% -Blood culture x2 pending  AKI: Likely prerenal from dehydration/ poor PO intake and home diuretic/ACE inhibitor/ARB use. BUN 50, Cr 4.15. Baseline Cr 0.9. -IV fluid hydration. Monitor renal function and urine output. Check urine Na and Cr. Order renal US.  Hold nephrotoxic medications/avoid contrast.  Hyponatremia: Likely due to poor oral intake.  Corrected sodium 129. -IV fluid hydration.  Check serum osmolarity.  Continue to monitor sodium level closely.  Nonanion gap metabolic acidosis: Suspect related to AKI.  Bicarb 18, anion gap 13. -IV fluid hydration and continue to monitor labs closely  Hypertension -Resume amlodipine and Coreg after pharmacy med rec is done.  Hold chlorthalidone, Lasix, lisinopril, and losartan given AKI.  Hyperglycemia in the setting of non-insulin-dependent type 2 diabetes: Blood glucose 301.  Bicarb low in the setting of AKI.  However, anion gap is normal. -Check A1c.  Sliding scale insulin moderate ACHS and CBG checks.  Chronic diastolic CHF: No signs of  volume overload at this time. -Hold diuretic  DVT prophylaxis: Subcutaneous heparin (increased dose) Code Status: Full code-confirmed with the patient. Family Communication: No family available at this time. Disposition Plan: Status is: Inpatient  Remains inpatient appropriate because:Unsafe d/c plan, IV treatments appropriate due to intensity of illness or inability to take PO and Inpatient level of care appropriate due to severity of illness   Dispo: The patient is from: Home              Anticipated d/c is to: Home              Anticipated d/c date is: > 3 days              Patient currently is not medically stable to d/c.  The medical decision making on this patient was of high complexity and the patient is at high risk for clinical deterioration, therefore this is a level 3 visit.  Shela Leff MD [facility]  If 7PM-7AM, please contact night-coverage [email or web address]  08/03/2020, 2:56 AM

## 2020-08-03 NOTE — ED Notes (Signed)
Dinner ordered 

## 2020-08-03 NOTE — Progress Notes (Addendum)
Inpatient Diabetes Program Recommendations  AACE/ADA: New Consensus Statement on Inpatient Glycemic Control (2015)  Target Ranges:  Prepandial:   less than 140 mg/dL      Peak postprandial:   less than 180 mg/dL (1-2 hours)      Critically ill patients:  140 - 180 mg/dL   Results for TREVIA, NOP (MRN [id number]) as of 08/03/2020 13:59  Ref. Range 08/03/2020 08:10 08/03/2020 12:25  Glucose-Capillary Latest Ref Range: 70 - 99 mg/dL 339 (H)  11 units NOVOLOG  385 (H)  15 units NOVOLOG given at 2pm    To ED with Acute hypoxemic Resp failure and sepsis secondary to COVID-19 viral multifocal Pneumonia  History: DM, CHF   Home DM Meds: Janumet 50/1000 mg-1 tablet Daily   Current Orders: Novolog Moderate Correction Scale/ SSI (0-15 units) TID AC + HS     MD- Note patient getting Solumedrol 50 mg BID.  CBGs >300 today.  Please consider the following while patient getting Steroids:  1. Start Levemir 7 units BID (0.075 units/kg BID) per the COVID 19 Glycemic Control order set  2. May also consider increasing frequency of Novolog SSI to Q4 hours while CBGs remain elevated     --Will follow patient during hospitalization--  Wyn Quaker RN, MSN, CDE Diabetes Coordinator Inpatient Glycemic Control Team Team Pager: [phone] (8a-5p)

## 2020-08-04 ENCOUNTER — Encounter (HOSPITAL_COMMUNITY): Payer: Self-pay | Admitting: Internal Medicine

## 2020-08-04 LAB — COMPREHENSIVE METABOLIC PANEL
ALT: 29 U/L (ref 0–44)
AST: 68 U/L — ABNORMAL HIGH (ref 15–41)
Albumin: 1.5 g/dL — ABNORMAL LOW (ref 3.5–5.0)
Alkaline Phosphatase: 33 U/L — ABNORMAL LOW (ref 38–126)
Anion gap: 13 (ref 5–15)
BUN: 78 mg/dL — ABNORMAL HIGH (ref 8–23)
CO2: 17 mmol/L — ABNORMAL LOW (ref 22–32)
Calcium: 7.4 mg/dL — ABNORMAL LOW (ref 8.9–10.3)
Chloride: 98 mmol/L (ref 98–111)
Creatinine, Ser: 5.28 mg/dL — ABNORMAL HIGH (ref 0.44–1.00)
GFR calc Af Amer: 9 mL/min — ABNORMAL LOW (ref >60)
GFR calc non Af Amer: 8 mL/min — ABNORMAL LOW (ref >60)
Glucose, Bld: 266 mg/dL — ABNORMAL HIGH (ref 70–99)
Potassium: 3.9 mmol/L (ref 3.5–5.1)
Sodium: 128 mmol/L — ABNORMAL LOW (ref 135–145)
Total Bilirubin: 0.6 mg/dL (ref 0.3–1.2)
Total Protein: 5.6 g/dL — ABNORMAL LOW (ref 6.5–8.1)

## 2020-08-04 LAB — CBC WITH DIFFERENTIAL/PLATELET
Abs Immature Granulocytes: 0.04 10*3/uL (ref 0.00–0.07)
Basophils Absolute: 0 10*3/uL (ref 0.0–0.1)
Basophils Relative: 0 %
Eosinophils Absolute: 0 10*3/uL (ref 0.0–0.5)
Eosinophils Relative: 0 %
HCT: 28.3 % — ABNORMAL LOW (ref 36.0–46.0)
Hemoglobin: 9.1 g/dL — ABNORMAL LOW (ref 12.0–15.0)
Immature Granulocytes: 1 %
Lymphocytes Relative: 13 %
Lymphs Abs: 0.7 10*3/uL (ref 0.7–4.0)
MCH: 27.7 pg (ref 26.0–34.0)
MCHC: 32.2 g/dL (ref 30.0–36.0)
MCV: 86 fL (ref 80.0–100.0)
Monocytes Absolute: 0.2 10*3/uL (ref 0.1–1.0)
Monocytes Relative: 3 %
Neutro Abs: 4.7 10*3/uL (ref 1.7–7.7)
Neutrophils Relative %: 83 %
Platelets: 300 10*3/uL (ref 150–400)
RBC: 3.29 MIL/uL — ABNORMAL LOW (ref 3.87–5.11)
RDW: 13.4 % (ref 11.5–15.5)
WBC: 5.6 10*3/uL (ref 4.0–10.5)
nRBC: 0 % (ref 0.0–0.2)

## 2020-08-04 LAB — CBG MONITORING, ED
Glucose-Capillary: 311 mg/dL — ABNORMAL HIGH (ref 70–99)
Glucose-Capillary: 306 mg/dL — ABNORMAL HIGH (ref 70–99)
Glucose-Capillary: 277 mg/dL — ABNORMAL HIGH (ref 70–99)
Glucose-Capillary: 313 mg/dL — ABNORMAL HIGH (ref 70–99)

## 2020-08-04 LAB — BASIC METABOLIC PANEL
Anion gap: 16 — ABNORMAL HIGH (ref 5–15)
Anion gap: 13 (ref 5–15)
BUN: 87 mg/dL — ABNORMAL HIGH (ref 8–23)
BUN: 93 mg/dL — ABNORMAL HIGH (ref 8–23)
CO2: 17 mmol/L — ABNORMAL LOW (ref 22–32)
CO2: 19 mmol/L — ABNORMAL LOW (ref 22–32)
Calcium: 7.8 mg/dL — ABNORMAL LOW (ref 8.9–10.3)
Calcium: 7.4 mg/dL — ABNORMAL LOW (ref 8.9–10.3)
Chloride: 96 mmol/L — ABNORMAL LOW (ref 98–111)
Chloride: 95 mmol/L — ABNORMAL LOW (ref 98–111)
Creatinine, Ser: 5.62 mg/dL — ABNORMAL HIGH (ref 0.44–1.00)
Creatinine, Ser: 5.92 mg/dL — ABNORMAL HIGH (ref 0.44–1.00)
GFR calc Af Amer: 8 mL/min — ABNORMAL LOW (ref >60)
GFR calc Af Amer: 8 mL/min — ABNORMAL LOW (ref >60)
GFR calc non Af Amer: 7 mL/min — ABNORMAL LOW (ref >60)
GFR calc non Af Amer: 7 mL/min — ABNORMAL LOW (ref >60)
Glucose, Bld: 327 mg/dL — ABNORMAL HIGH (ref 70–99)
Glucose, Bld: 320 mg/dL — ABNORMAL HIGH (ref 70–99)
Potassium: 4 mmol/L (ref 3.5–5.1)
Potassium: 4 mmol/L (ref 3.5–5.1)
Sodium: 129 mmol/L — ABNORMAL LOW (ref 135–145)
Sodium: 127 mmol/L — ABNORMAL LOW (ref 135–145)

## 2020-08-04 LAB — HEMOGLOBIN A1C
Hgb A1c MFr Bld: 8.3 % — ABNORMAL HIGH (ref 4.8–5.6)
Mean Plasma Glucose: 191.51 mg/dL

## 2020-08-04 LAB — D-DIMER, QUANTITATIVE: D-Dimer, Quant: 5.86 ug/mL-FEU — ABNORMAL HIGH (ref 0.00–0.50)

## 2020-08-04 LAB — C-REACTIVE PROTEIN: CRP: 12.1 mg/dL — ABNORMAL HIGH (ref <1.0)

## 2020-08-04 LAB — PROCALCITONIN: Procalcitonin: 0.72 ng/mL

## 2020-08-04 MED ORDER — INSULIN DETEMIR 100 UNIT/ML ~~LOC~~ SOLN
7.0000 [IU] | Freq: Two times a day (BID) | SUBCUTANEOUS | Status: DC
  Administered 2020-08-04 – 2020-08-09 (×10): 7 [IU] via SUBCUTANEOUS
  Filled 2020-08-04 (×11): qty 0.07

## 2020-08-04 MED ORDER — STERILE WATER FOR INJECTION IV SOLN
INTRAVENOUS | Status: DC
  Filled 2020-08-04: qty 150
  Filled 2020-08-04: qty 850

## 2020-08-04 MED ORDER — LOPERAMIDE HCL 2 MG PO CAPS
2.0000 mg | ORAL_CAPSULE | ORAL | Status: DC | PRN
  Administered 2020-08-04: 2 mg via ORAL

## 2020-08-04 NOTE — ED Notes (Signed)
Breakfast Ordered 

## 2020-08-04 NOTE — ED Notes (Signed)
Pt up to use BSC, given supplies to bathe.

## 2020-08-04 NOTE — ED Notes (Signed)
During report to 5W I noted the elevated glucose and anion gab from the labs during the day.  MD notified and order received to draw another BMP.  Sent this to lab.  HS coverage given.

## 2020-08-04 NOTE — Progress Notes (Signed)
Inpatient Diabetes Program Recommendations  AACE/ADA: New Consensus Statement on Inpatient Glycemic Control (2015)  Target Ranges:  Prepandial:   less than 140 mg/dL      Peak postprandial:   less than 180 mg/dL (1-2 hours)      Critically ill patients:  140 - 180 mg/dL   Lab Results  Component Value Date   GLUCAP 261 (H) 08/03/2020   HGBA1C 8.3 (H) 08/04/2020    Review of Glycemic Control Results for ANNALIZ, AVEN (MRN [id number]) as of 08/04/2020 11:08  Ref. Range 08/03/2020 08:10 08/03/2020 12:25 08/03/2020 17:51 08/03/2020 22:13  Glucose-Capillary Latest Ref Range: 70 - 99 mg/dL 339 (H) 385 (H) 303 (H) 261 (H)   History: DM, CHF   Home DM Meds: Janumet 50/1000 mg-1 tablet Daily   Current Orders: Novolog Moderate Correction Scale/ SSI (0-15 units) TID AC + HS, Solumedrol 50 mg BID  Inpatient Diabetes Program Recommendations:    Start Levemir 7 units BID (0.075 units/kg BID) per the COVID 19 Glycemic Control order set  Will continue to follow while inpatient.  Thank you, Reche Dixon, RN, BSN Diabetes Coordinator Inpatient Diabetes Program [phone] (team pager from 8a-5p)

## 2020-08-04 NOTE — ED Notes (Signed)
Call to [facility]

## 2020-08-04 NOTE — Progress Notes (Signed)
PROGRESS NOTE    Veronica Simmons  KLK:[id number] DOB: 05/26/1951 DOA: 08/02/2020 PCP: Katherina Mires, MD    Brief Narrative:  69 year old female with history of hypertension, hyperlipidemia, type 2 diabetes, anemia, chronic diastolic heart failure presented with shortness of breath and poor oral intake.  She was seen in the emergency room on 8/13 and tested positive for Covid.  She continued to have shortness of breath, chills and poor appetite. At the emergency room, temperature 100.5.  86% on room air.  Needed 2 L oxygen.  Creatinine 4.15.  Baseline creatinine 0.9 few days ago.  Chest x-ray showed progressive bilateral airspace disease new since 8/13.  Admitted with acute renal failure and COVID-19 pneumonia.   Assessment & Plan:   Principal Problem:   Pneumonia due to COVID-19 virus Active Problems:   Acute respiratory failure with hypoxemia (HCC)   Sepsis ([place])   AKI (acute kidney injury) ([place])   Hyponatremia  Pneumonia due to COVID-19 virus: Continue to monitor due to significant symptoms  chest physiotherapy, incentive spirometry, deep breathing exercises, sputum induction, mucolytic's and bronchodilators. Supplemental oxygen to keep saturations more than 90%. Covid directed therapy with , steroids Solu-Medrol remdesivir day 2/5 antibiotics , started due to elevated procalcitonin.  We will continue to monitor and assess for daily use. Due to severity of symptoms, patient will need daily inflammatory markers, chest x-rays, liver function test to monitor and direct COVID-19 therapies.  COVID-19 Labs  Recent Labs    08/02/20 2327 08/04/20 0404  DDIMER 4.84* 5.86*  FERRITIN 735*  --   LDH 675*  --   CRP 14.6* 12.1*    Lab Results  Component Value Date   SARSCOV2NAA POSITIVE (A) 07/30/2020   SARSCOV2NAA RESULT: NEGATIVE 07/15/2020   [place] Not Detected 10/27/2019   [place] NEGATIVE 10/17/2019   SpO2: 94 % O2 Flow Rate (L/min): 2 L/min  Acute  kidney injury: Probably prerenal with combination of dehydration, ongoing diarrhea and nephrotoxicity with the use of lisinopril and Metformin.  Patient is making adequate urine.  Recorded 1100 mL over the last 24 hours.  Resuscitated with IV fluids. Renal ultrasound was without any hydronephrosis or obstruction.  It does show chronic kidney disease. Recent creatinine was 0.95 on 10/31.  She does follow-up with nephrology. Will change to bicarb containing fluid.  Repeat BMP shows further elevated BUN and creatinine. We will change to bicarb containing fluid. I discussed case with nephrologist for consultation.  Recheck in the morning.  Hyponatremia: Due to above.  Continue to monitor.  Hypertension: Blood pressures fairly stable.  Holding all antihypertensives.  Hyperglycemia in the setting of noninsulin-dependent type 2 diabetes: Now with a steroid use: On Janumet at home.  Discontinue all oral hypoglycemics.  Keep on sliding scale insulin. Due to significant elevated blood sugars with use of steroids, will add long-acting insulin until she is on steroids.   DVT prophylaxis: heparin injection 7,500 Units Start: 08/03/20 1400   Code Status: Full code Family Communication: None.  Patient stated she will communicate with her children Disposition Plan: Status is: Inpatient  Remains inpatient appropriate because:Persistent severe electrolyte disturbances, IV treatments appropriate due to intensity of illness or inability to take PO and Inpatient level of care appropriate due to severity of illness   Dispo: The patient is from: Home              Anticipated d/c is to: Home              Anticipated d/c  date is: 3 days              Patient currently is not medically stable to d/c.         Consultants:   Nephrology, will be seen tomorrow a.m.  Procedures:   None  Antimicrobials:  Antibiotics Given (last 72 hours)    Date/Time Action Medication Dose Rate   08/03/20 0343 New  Bag/Given   cefTRIAXone (ROCEPHIN) 1 g in sodium chloride 0.9 % 100 mL IVPB 1 g 200 mL/hr   08/03/20 0543 New Bag/Given   azithromycin (ZITHROMAX) 500 mg in sodium chloride 0.9 % 250 mL IVPB 500 mg 250 mL/hr   08/03/20 0655 New Bag/Given   remdesivir 200 mg in sodium chloride 0.9% 250 mL IVPB 200 mg 580 mL/hr   08/03/20 2222 New Bag/Given   cefTRIAXone (ROCEPHIN) 1 g in sodium chloride 0.9 % 100 mL IVPB 1 g 200 mL/hr   08/04/20 0015 New Bag/Given   azithromycin (ZITHROMAX) 500 mg in sodium chloride 0.9 % 250 mL IVPB 500 mg 250 mL/hr   08/04/20 1254 New Bag/Given   remdesivir 100 mg in sodium chloride 0.9 % 100 mL IVPB 100 mg 200 mL/hr         Subjective: Patient seen and examined.  Had some diarrhea and went to time since morning.  She is a still in the emergency room waiting for inpatient bed assignment. Patient herself stated her breathing better and feels hydrated.  Denied any chest pain or shortness of breath.  Mostly on room air at rest.  Objective: Vitals:   08/04/20 1207 08/04/20 1453 08/04/20 1542 08/04/20 1614  BP: 139/71   (!) 176/77  Pulse: 77 71 69 72  Resp: 14 (!) 25 16 (!) 24  Temp:      TempSrc:      SpO2: 95% 93% 94% 94%    Intake/Output Summary (Last 24 hours) at 08/04/2020 1749 Last data filed at 08/04/2020 1615 Gross per 24 hour  Intake 950 ml  Output 1100 ml  Net -150 ml   There were no vitals filed for this visit.  Examination:  General exam: Appears calm and comfortable  On 1 L of oxygen and looks comfortable. Respiratory system: Clear to auscultation. Respiratory effort normal.  No added sounds. Cardiovascular system: S1 & S2 heard, RRR.  No edema. Gastrointestinal system: Abdomen is nondistended, soft and nontender. No organomegaly or masses felt. Normal bowel sounds heard.  Obese and pendulous. Central nervous system: Alert and oriented. No focal neurological deficits. Extremities: Symmetric 5 x 5 power. Skin: No rashes, lesions or  ulcers Psychiatry: Judgement and insight appear normal. Mood & affect appropriate.     Data Reviewed: I have personally reviewed following labs and imaging studies  CBC: Recent Labs  Lab 07/30/20 1756 08/02/20 2327 08/04/20 0404  WBC 4.0 6.3 5.6  NEUTROABS 2.7 5.0 4.7  HGB 10.0* 9.1* 9.1*  HCT 31.1* 28.5* 28.3*  MCV 89.6 88.8 86.0  PLT 196 305 161   Basic Metabolic Panel: Recent Labs  Lab 07/30/20 1756 08/02/20 2327 08/04/20 0404 08/04/20 1303  NA 131* 126* 128* 129*  K 4.1 4.4 3.9 4.0  CL 98 95* 98 96*  CO2 23 18* 17* 17*  GLUCOSE 295* 301* 266* 327*  BUN 28* 50* 78* 87*  CREATININE 1.76* 4.15* 5.28* 5.62*  CALCIUM 8.4* 8.1* 7.4* 7.8*   GFR: Estimated Creatinine Clearance: 11 mL/min (A) (by C-G formula based on SCr of 5.62 mg/dL (H)). Liver Function Tests:  Recent Labs  Lab 08/02/20 2327 08/04/20 0404  AST 85* 68*  ALT 29 29  ALKPHOS 33* 33*  BILITOT 0.7 0.6  PROT 6.1* 5.6*  ALBUMIN 2.0* 1.5*   No results for input(s): LIPASE, AMYLASE in the last 168 hours. No results for input(s): AMMONIA in the last 168 hours. Coagulation Profile: No results for input(s): INR, PROTIME in the last 168 hours. Cardiac Enzymes: No results for input(s): CKTOTAL, CKMB, CKMBINDEX, TROPONINI in the last 168 hours. BNP (last 3 results) No results for input(s): PROBNP in the last 8760 hours. HbA1C: Recent Labs    08/03/20 0319 08/04/20 0404  HGBA1C 8.4* 8.3*   CBG: Recent Labs  Lab 08/03/20 1751 08/03/20 2213 08/04/20 0838 08/04/20 1223 08/04/20 1659  GLUCAP 303* 261* 311* 306* 277*   Lipid Profile: Recent Labs    08/02/20 2327  TRIG 296*   Thyroid Function Tests: No results for input(s): TSH, T4TOTAL, FREET4, T3FREE, THYROIDAB in the last 72 hours. Anemia Panel: Recent Labs    08/02/20 2327  FERRITIN 735*   Sepsis Labs: Recent Labs  Lab 07/30/20 1810 08/02/20 2327 08/03/20 0226 08/03/20 0319 08/04/20 0404  PROCALCITON <0.10 0.53  --  0.65 0.72   LATICACIDVEN  --  1.9 1.5  --   --     Recent Results (from the past 240 hour(s))  SARS Coronavirus 2 by RT PCR (hospital order, performed in [place] hospital lab) Nasopharyngeal Nasopharyngeal Swab     Status: Abnormal   Collection Time: 07/30/20  6:40 PM   Specimen: Nasopharyngeal Swab  Result Value Ref Range Status   SARS Coronavirus 2 POSITIVE (A) NEGATIVE Final    Comment: CRITICAL RESULT CALLED TO, READ BACK BY AND VERIFIED WITH: FRANKLIN,C.,RN @ 2153 07/30/20 TURNER,S. (NOTE) SARS-CoV-2 target nucleic acids are DETECTED  SARS-CoV-2 RNA is generally detectable in upper respiratory specimens  during the acute phase of infection.  Positive results are indicative  of the presence of the identified virus, but do not rule out bacterial infection or co-infection with other pathogens not detected by the test.  Clinical correlation with patient history and  other diagnostic information is necessary to determine patient infection status.  The expected result is negative.  Fact Sheet for Patients:   StrictlyIdeas.no   Fact Sheet for Healthcare Providers:   BankingDealers.co.za    This test is not yet approved or cleared by the [place] FDA and  has been authorized for detection and/or diagnosis of SARS-CoV-2 by FDA under an Emergency Use Authorization (EUA).  This EUA will remain in effect (m eaning this test can be used) for the duration of  the COVID-19 declaration under Section 564(b)(1) of the Act, 21 U.S.C. section 360-bbb-3(b)(1), unless the authorization is terminated or revoked sooner.  Performed at [facility], [place] [address] [place], [place] [address]   Blood Culture (routine x 2)     Status: None (Preliminary result)   Collection Time: 08/02/20 11:27 PM   Specimen: BLOOD  Result Value Ref Range Status   Specimen Description BLOOD RIGHT ARM  Final   Special Requests   Final    BOTTLES DRAWN  AEROBIC ONLY Blood Culture results may not be optimal due to an inadequate volume of blood received in culture bottles   Culture   Final    NO GROWTH 1 DAY Performed at [facility], [place] [address] [place], [place] [address]    Report Status PENDING  Incomplete  Blood Culture (routine x  2)     Status: None (Preliminary result)   Collection Time: 08/03/20  2:26 AM   Specimen: BLOOD  Result Value Ref Range Status   Specimen Description BLOOD LEFT ARM  Final   Special Requests   Final    BOTTLES DRAWN AEROBIC AND ANAEROBIC Blood Culture adequate volume   Culture   Final    NO GROWTH 1 DAY Performed at [facility], [address] [place], [place] [address]    Report Status PENDING  Incomplete         Radiology Studies: US RENAL  Result Date: 08/03/2020 CLINICAL DATA:  Acute kidney injury, history of hypertension and diabetes EXAM: RENAL / URINARY TRACT ULTRASOUND COMPLETE COMPARISON:  CT abdomen pelvis 09/20/2016, ultrasound 08/20/2009 FINDINGS: Right Kidney: Renal measurements: 13.4 x 5.2 x 6.1 cm = volume: 220.1 mL. Increased renal cortical echogenicity. No concerning renal mass, shadowing calculus or hydronephrosis. Left Kidney: Renal measurements: 12.5 x 6.5 x 7 cm = volume: 298.2 mL. Increased renal cortical echogenicity. No concerning renal mass, shadowing calculus or hydronephrosis. Bladder: Appears normal for degree of bladder distention. Other: None. IMPRESSION: 1. Increased renal cortical echogenicity compatible with medical renal disease. 2. Otherwise unremarkable renal ultrasound. Electronically Signed   By: Lovena Le M.D.   On: 08/03/2020 03:41   DG Chest Port 1 View  Result Date: 08/03/2020 CLINICAL DATA:  Shortness of breath.  COVID positive 3 days ago. EXAM: PORTABLE CHEST 1 VIEW COMPARISON:  Radiograph 07/30/2020 FINDINGS: New patchy opacities in both mid and lower lung zones from prior exam. The right upper lobe opacity is persistent, slightly increased.  Borderline cardiomegaly is unchanged. Unchanged mediastinal contours. Lower lung volumes from prior. No pneumothorax or large pleural effusion. No acute osseous abnormalities are seen. IMPRESSION: Progression in bilateral airspace disease over the past 3 days with new opacities in both mid lower lung zones. Pattern typical of COVID-19 pneumonia. Electronically Signed   By: Keith Rake M.D.   On: 08/03/2020 00:00        Scheduled Meds: . vitamin C  500 mg Oral Daily  . cholecalciferol  1,000 Units Oral Daily  . heparin  7,500 Units Subcutaneous Q8H  . insulin aspart  0-15 Units Subcutaneous TID WC  . insulin aspart  0-5 Units Subcutaneous QHS  . insulin detemir  7 Units Subcutaneous BID  . methylPREDNISolone (SOLU-MEDROL) injection  50 mg Intravenous BID  . zinc sulfate  220 mg Oral Daily   Continuous Infusions: . azithromycin Stopped (08/04/20 0134)  . cefTRIAXone (ROCEPHIN)  IV Stopped (08/03/20 2316)  . remdesivir 100 mg in NS 100 mL Stopped (08/04/20 1452)  .  sodium bicarbonate (isotonic) infusion in sterile water 125 mL/hr at 08/04/20 1302     LOS: 1 day    Time spent: 30 minutes    Barb Merino, MD [facility] Pager [phone]

## 2020-08-05 LAB — COMPREHENSIVE METABOLIC PANEL
ALT: 27 U/L (ref 0–44)
AST: 56 U/L — ABNORMAL HIGH (ref 15–41)
Albumin: 1.8 g/dL — ABNORMAL LOW (ref 3.5–5.0)
Alkaline Phosphatase: 38 U/L (ref 38–126)
Anion gap: 16 — ABNORMAL HIGH (ref 5–15)
BUN: 97 mg/dL — ABNORMAL HIGH (ref 8–23)
CO2: 17 mmol/L — ABNORMAL LOW (ref 22–32)
Calcium: 7.8 mg/dL — ABNORMAL LOW (ref 8.9–10.3)
Chloride: 98 mmol/L (ref 98–111)
Creatinine, Ser: 6.28 mg/dL — ABNORMAL HIGH (ref 0.44–1.00)
GFR calc Af Amer: 7 mL/min — ABNORMAL LOW (ref >60)
GFR calc non Af Amer: 6 mL/min — ABNORMAL LOW (ref >60)
Glucose, Bld: 262 mg/dL — ABNORMAL HIGH (ref 70–99)
Potassium: 4 mmol/L (ref 3.5–5.1)
Sodium: 131 mmol/L — ABNORMAL LOW (ref 135–145)
Total Bilirubin: 0.6 mg/dL (ref 0.3–1.2)
Total Protein: 6.1 g/dL — ABNORMAL LOW (ref 6.5–8.1)

## 2020-08-05 LAB — CBC WITH DIFFERENTIAL/PLATELET
Abs Immature Granulocytes: 0 10*3/uL (ref 0.00–0.07)
Basophils Absolute: 0 10*3/uL (ref 0.0–0.1)
Basophils Relative: 0 %
Eosinophils Absolute: 0 10*3/uL (ref 0.0–0.5)
Eosinophils Relative: 0 %
HCT: 30.8 % — ABNORMAL LOW (ref 36.0–46.0)
Hemoglobin: 10.2 g/dL — ABNORMAL LOW (ref 12.0–15.0)
Lymphocytes Relative: 6 %
Lymphs Abs: 0.6 10*3/uL — ABNORMAL LOW (ref 0.7–4.0)
MCH: 27.9 pg (ref 26.0–34.0)
MCHC: 33.1 g/dL (ref 30.0–36.0)
MCV: 84.2 fL (ref 80.0–100.0)
Monocytes Absolute: 0.1 10*3/uL (ref 0.1–1.0)
Monocytes Relative: 1 %
Neutro Abs: 9.2 10*3/uL — ABNORMAL HIGH (ref 1.7–7.7)
Neutrophils Relative %: 93 %
Platelets: 422 10*3/uL — ABNORMAL HIGH (ref 150–400)
RBC: 3.66 MIL/uL — ABNORMAL LOW (ref 3.87–5.11)
RDW: 13.4 % (ref 11.5–15.5)
WBC: 9.9 10*3/uL (ref 4.0–10.5)
nRBC: 0 /100 WBC
nRBC: 0.3 % — ABNORMAL HIGH (ref 0.0–0.2)

## 2020-08-05 LAB — GLUCOSE, CAPILLARY
Glucose-Capillary: 250 mg/dL — ABNORMAL HIGH (ref 70–99)
Glucose-Capillary: 304 mg/dL — ABNORMAL HIGH (ref 70–99)
Glucose-Capillary: 284 mg/dL — ABNORMAL HIGH (ref 70–99)
Glucose-Capillary: 300 mg/dL — ABNORMAL HIGH (ref 70–99)

## 2020-08-05 LAB — C-REACTIVE PROTEIN: CRP: 6.6 mg/dL — ABNORMAL HIGH (ref <1.0)

## 2020-08-05 LAB — PROCALCITONIN: Procalcitonin: 0.7 ng/mL

## 2020-08-05 LAB — D-DIMER, QUANTITATIVE: D-Dimer, Quant: 4.2 ug/mL-FEU — ABNORMAL HIGH (ref 0.00–0.50)

## 2020-08-05 MED ORDER — AMLODIPINE BESYLATE 10 MG PO TABS
10.0000 mg | ORAL_TABLET | Freq: Every day | ORAL | Status: DC
  Administered 2020-08-05 – 2020-08-09 (×5): 10 mg via ORAL
  Filled 2020-08-05 (×5): qty 1

## 2020-08-05 NOTE — TOC Initial Note (Signed)
Transition of Care [facility]) - Initial/Assessment Note    Patient Details  Name: Veronica Simmons MRN: [id number] Date of Birth: 1951/06/26  [facility] CM/SW Contact:    Verdell Carmine, RN Phone Number: 08/05/2020, 1:41 PM  Clinical Narrative:                 Admitted for dehydration COVID had one dose of vaccine. Is now also in AKI followed by Renal with creatinine 5.6.  o2 currently at 2L will follow for discharge needs.Expected Discharge Plan: Home/Self Care Barriers to Discharge: Continued Medical Work up   Patient Goals and CMS Choice        Expected Discharge Plan and Services Expected Discharge Plan: Home/Self Care                                              Prior Living Arrangements/Services     Patient language and need for interpreter reviewed:: Yes        Need for Family Participation in Patient Care: Yes (Comment) Care giver support system in place?: Yes (comment)   Criminal Activity/Legal Involvement Pertinent to Current Situation/Hospitalization: No - Comment as needed  Activities of Daily Living Home Assistive Devices/Equipment: None ADL Screening (condition at time of admission) Patient's cognitive ability adequate to safely complete daily activities?: Yes Is the patient deaf or have difficulty hearing?: No Does the patient have difficulty seeing, even when wearing glasses/contacts?: No Does the patient have difficulty concentrating, remembering, or making decisions?: No Patient able to express need for assistance with ADLs?: Yes Does the patient have difficulty dressing or bathing?: No Independently performs ADLs?: No Communication: Independent Dressing (OT): Needs assistance Is this a change from baseline?: Change from baseline, expected to last <3days Grooming: Independent Feeding: Independent Bathing: Needs assistance Is this a change from baseline?: Pre-admission baseline Toileting: Needs assistance Is this  a change from baseline?: Change from baseline, expected to last <3 days In/Out Bed: Needs assistance Is this a change from baseline?: Change from baseline, expected to last <3 days Walks in Home: Needs assistance Is this a change from baseline?: Change from baseline, expected to last <3 days Does the patient have difficulty walking or climbing stairs?: No Weakness of Legs: Both Weakness of Arms/Hands: None  Permission Sought/Granted      Share Information with NAME: patient only           Emotional Assessment Appearance:: Appears stated age     Orientation: : Oriented to Self, Oriented to Place, Oriented to  Time, Oriented to Situation Alcohol / Substance Use: Not Applicable Psych Involvement: No (comment)  Admission diagnosis:  Hyponatremia [E87.1] Hypoxia [R09.02] AKI (acute kidney injury) ([place]) [N17.9] Pneumonia due to COVID-19 virus [U07.1, J12.82] Patient Active Problem List   Diagnosis Date Noted   Pneumonia due to COVID-19 virus 08/03/2020   Acute respiratory failure with hypoxemia ([place]) 08/03/2020   Sepsis ([place]) 08/03/2020   AKI (acute kidney injury) ([place]) 08/03/2020   Hyponatremia 08/03/2020   New onset of congestive heart failure ([place]) 10/17/2019   Hypertensive urgency 10/17/2019   Right sided weakness 03/07/2018   HTN (hypertension) 03/07/2018   Obesity (BMI 30-39.9) 03/07/2018   Skin papules, generalized 08/04/2016   Skin lesion of right lower limb 06/19/2016   Leg swelling 05/12/2016   Subconjunctival hemorrhage of right eye 04/27/2016   Diarrhea 01/09/2016  Health care maintenance 06/28/2015   Anemia 12/21/2014   Palpitations 01/07/2014   Recurrent yeast vaginitis 11/05/2013   Recurrent genital herpes 07/17/2013   Hot flashes not due to menopause 11/27/2012   Cubital tunnel syndrome on right 10/31/2012   Type 2 diabetes, controlled, with neuropathy ([place]) 02/14/2007   HYPERLIPIDEMIA 02/14/2007   OBESITY, NOS 02/14/2007    HYPERTENSION, BENIGN SYSTEMIC 02/14/2007   ARTHRITIS 02/14/2007   Insomnia 02/14/2007   PCP:  Katherina Mires, MD Pharmacy:   [facility] [place] [address] Phone: [phone] Fax: [place] [place] [address] [place] [address] Phone: [phone] Fax: [phone]     Social Determinants of Health (SDOH) Interventions    Readmission Risk Interventions No flowsheet data found.

## 2020-08-05 NOTE — Progress Notes (Addendum)
PROGRESS NOTE    Veronica Simmons  VEH:[id number] DOB: [birth year] DOA: 08/02/2020 PCP: Katherina Mires, MD    Brief Narrative:  69 year old female with history of hypertension, hyperlipidemia, type 2 diabetes, anemia, chronic diastolic heart failure presented with shortness of breath and poor oral intake.  She was seen in the emergency room on 8/13 and tested positive for Covid.  She continued to have shortness of breath, chills and poor appetite. At the emergency room, temperature 100.5.  86% on room air.  Needed 2 L oxygen.  Creatinine 4.15.  Baseline creatinine 0.9 few days ago.  Chest x-ray showed progressive bilateral airspace disease new since 8/13.  Admitted with acute renal failure and COVID-19 pneumonia.   Assessment & Plan:   Principal Problem:   Pneumonia due to COVID-19 virus Active Problems:   Acute respiratory failure with hypoxemia (HCC)   AKI (acute kidney injury) ([place])   Hyponatremia  Pneumonia due to COVID-19 virus: Continue to monitor due to significant symptoms  chest physiotherapy, incentive spirometry, deep breathing exercises, sputum induction, mucolytic's and bronchodilators. Supplemental oxygen to keep saturations more than 90%. Covid directed therapy with , steroids Solu-Medrol remdesivir day 3/5 antibiotics , started due to elevated procalcitonin.  She does not have evidence of bacterial infection.  Procalcitonin could be elevated from acute renal failure.  Will discontinue broad-spectrum antibiotics. Due to severity of symptoms, patient will need daily inflammatory markers, chest x-rays, liver function test to monitor and direct COVID-19 therapies.  COVID-19 Labs  Recent Labs    08/02/20 2327 08/04/20 0404 08/05/20 0500  DDIMER 4.84* 5.86* 4.20*  FERRITIN 735*  --   --   LDH 675*  --   --   CRP 14.6* 12.1* 6.6*    Lab Results  Component Value Date   SARSCOV2NAA POSITIVE (A) 07/30/2020   SARSCOV2NAA RESULT: NEGATIVE 07/15/2020    [place] Not Detected 10/27/2019   [place] NEGATIVE 10/17/2019   SpO2: 92 % O2 Flow Rate (L/min): 2 L/min  Acute kidney injury: Probably prerenal with combination of dehydration, ongoing diarrhea and nephrotoxicity with the use of lisinopril and Metformin. Resuscitated with IV fluids. Renal ultrasound was without any hydronephrosis or obstruction.  It does show chronic kidney disease. Recent creatinine was 0.95 on 10/31.  She does follow-up with nephrology. Patient continues to have worsening renal functions.  Potassium is normal.  No other uremic complications. Discussed with nephrology and they saw the patient today here.  Will need very close monitoring.  Hyponatremia: Due to above.  Continue to monitor.  Hypertension: Blood pressures fairly stable.  Morning blood pressures are elevated.  Will start patient on amlodipine.  Holding lisinopril.  Hyperglycemia in the setting of noninsulin-dependent type 2 diabetes: Now with steroid use: On Janumet at home.  Discontinue all oral hypoglycemics.  Keep on sliding scale insulin. Due to significant elevated blood sugars with use of steroids, will add long-acting insulin until she is on steroids.  Sepsis ; suspected , ruled out.    DVT prophylaxis: heparin injection 7,500 Units Start: 08/03/20 1400   Code Status: Full code Family Communication: None.  Patient did not agree for me to call family. Disposition Plan: Status is: Inpatient  Remains inpatient appropriate because:Persistent severe electrolyte disturbances, IV treatments appropriate due to intensity of illness or inability to take PO and Inpatient level of care appropriate due to severity of illness   Dispo: The patient is from: Home              Anticipated  d/c is to: Home              Anticipated d/c date is: 3 days              Patient currently is not medically stable to d/c.      Consultants:   Nephrology,   Procedures:   None  Antimicrobials:   Antibiotics Given (last 72 hours)    Date/Time Action Medication Dose Rate   08/03/20 0343 New Bag/Given   cefTRIAXone (ROCEPHIN) 1 g in sodium chloride 0.9 % 100 mL IVPB 1 g 200 mL/hr   08/03/20 0543 New Bag/Given   azithromycin (ZITHROMAX) 500 mg in sodium chloride 0.9 % 250 mL IVPB 500 mg 250 mL/hr   08/03/20 0655 New Bag/Given   remdesivir 200 mg in sodium chloride 0.9% 250 mL IVPB 200 mg 580 mL/hr   08/03/20 2222 New Bag/Given   cefTRIAXone (ROCEPHIN) 1 g in sodium chloride 0.9 % 100 mL IVPB 1 g 200 mL/hr   08/04/20 0015 New Bag/Given   azithromycin (ZITHROMAX) 500 mg in sodium chloride 0.9 % 250 mL IVPB 500 mg 250 mL/hr   08/04/20 1254 New Bag/Given   remdesivir 100 mg in sodium chloride 0.9 % 100 mL IVPB 100 mg 200 mL/hr   08/04/20 2343 New Bag/Given   cefTRIAXone (ROCEPHIN) 1 g in sodium chloride 0.9 % 100 mL IVPB 1 g 200 mL/hr   08/05/20 0335 New Bag/Given   azithromycin (ZITHROMAX) 500 mg in sodium chloride 0.9 % 250 mL IVPB 500 mg 250 mL/hr   08/05/20 1005 New Bag/Given   remdesivir 100 mg in sodium chloride 0.9 % 100 mL IVPB 100 mg 200 mL/hr         Subjective:  Patient seen and examined. Feels weak and tired. Urine out put is not well documented. She was so worried about her medications and their side effects. Denies any nausea or vomiting, no chest pain , no more diarrhea.  Objective: Vitals:   08/05/20 0403 08/05/20 0802 08/05/20 1200 08/05/20 1312  BP: (!) 149/66     Pulse: 67     Resp: 18     Temp: 98.5 F (36.9 C) 97.9 F (36.6 C) 97.9 F (36.6 C) 97.7 F (36.5 C)  TempSrc: Oral Oral Oral   SpO2: 92%     Weight:      Height:        Intake/Output Summary (Last 24 hours) at 08/05/2020 1526 Last data filed at 08/05/2020 1300 Gross per 24 hour  Intake 1585.37 ml  Output 1100 ml  Net 485.37 ml   Filed Weights   08/04/20 2339  Weight: 104.7 kg    Examination:  General exam: Appears calm and comfortable , anxious, sick looking, on room  air. Respiratory system: Clear to auscultation. Respiratory effort normal.  No added sounds. Cardiovascular system: S1 & S2 heard, RRR.  No edema. Gastrointestinal system: Abdomen is nondistended, soft and nontender. No organomegaly or masses felt. Normal bowel sounds heard.  Obese and pendulous. Central nervous system: Alert and oriented. No focal neurological deficits. Extremities: Symmetric 5 x 5 power. Skin: No rashes, lesions or ulcers Psychiatry: Judgement and insight appear normal. Mood & affect anxious.    Data Reviewed: I have personally reviewed following labs and imaging studies  CBC: Recent Labs  Lab 07/30/20 1756 08/02/20 2327 08/04/20 0404 08/05/20 0500  WBC 4.0 6.3 5.6 9.9  NEUTROABS 2.7 5.0 4.7 9.2*  HGB 10.0* 9.1* 9.1* 10.2*  HCT 31.1* 28.5* 28.3*  30.8*  MCV 89.6 88.8 86.0 84.2  PLT 196 305 300 202*   Basic Metabolic Panel: Recent Labs  Lab 08/02/20 2327 08/04/20 0404 08/04/20 1303 08/04/20 2223 08/05/20 0500  NA 126* 128* 129* 127* 131*  K 4.4 3.9 4.0 4.0 4.0  CL 95* 98 96* 95* 98  CO2 18* 17* 17* 19* 17*  GLUCOSE 301* 266* 327* 320* 262*  BUN 50* 78* 87* 93* 97*  CREATININE 4.15* 5.28* 5.62* 5.92* 6.28*  CALCIUM 8.1* 7.4* 7.8* 7.4* 7.8*   GFR: Estimated Creatinine Clearance: 10.2 mL/min (A) (by C-G formula based on SCr of 6.28 mg/dL (H)). Liver Function Tests: Recent Labs  Lab 08/02/20 2327 08/04/20 0404 08/05/20 0500  AST 85* 68* 56*  ALT 29 29 27   ALKPHOS 33* 33* 38  BILITOT 0.7 0.6 0.6  PROT 6.1* 5.6* 6.1*  ALBUMIN 2.0* 1.5* 1.8*   No results for input(s): LIPASE, AMYLASE in the last 168 hours. No results for input(s): AMMONIA in the last 168 hours. Coagulation Profile: No results for input(s): INR, PROTIME in the last 168 hours. Cardiac Enzymes: No results for input(s): CKTOTAL, CKMB, CKMBINDEX, TROPONINI in the last 168 hours. BNP (last 3 results) No results for input(s): PROBNP in the last 8760 hours. HbA1C: Recent Labs     08/03/20 0319 08/04/20 0404  HGBA1C 8.4* 8.3*   CBG: Recent Labs  Lab 08/04/20 1223 08/04/20 1659 08/04/20 2225 08/05/20 0737 08/05/20 1156  GLUCAP 306* 277* 313* 250* 304*   Lipid Profile: Recent Labs    08/02/20 2327  TRIG 296*   Thyroid Function Tests: No results for input(s): TSH, T4TOTAL, FREET4, T3FREE, THYROIDAB in the last 72 hours. Anemia Panel: Recent Labs    08/02/20 2327  FERRITIN 735*   Sepsis Labs: Recent Labs  Lab 08/02/20 2327 08/03/20 0226 08/03/20 0319 08/04/20 0404 08/05/20 0500  PROCALCITON 0.53  --  0.65 0.72 0.70  LATICACIDVEN 1.9 1.5  --   --   --     Recent Results (from the past 240 hour(s))  SARS Coronavirus 2 by RT PCR (hospital order, performed in [place] hospital lab) Nasopharyngeal Nasopharyngeal Swab     Status: Abnormal   Collection Time: 07/30/20  6:40 PM   Specimen: Nasopharyngeal Swab  Result Value Ref Range Status   SARS Coronavirus 2 POSITIVE (A) NEGATIVE Final    Comment: CRITICAL RESULT CALLED TO, READ BACK BY AND VERIFIED WITH: FRANKLIN,C.,RN @ 2153 07/30/20 TURNER,S. (NOTE) SARS-CoV-2 target nucleic acids are DETECTED  SARS-CoV-2 RNA is generally detectable in upper respiratory specimens  during the acute phase of infection.  Positive results are indicative  of the presence of the identified virus, but do not rule out bacterial infection or co-infection with other pathogens not detected by the test.  Clinical correlation with patient history and  other diagnostic information is necessary to determine patient infection status.  The expected result is negative.  Fact Sheet for Patients:   StrictlyIdeas.no   Fact Sheet for Healthcare Providers:   BankingDealers.co.za    This test is not yet approved or cleared by the [place] FDA and  has been authorized for detection and/or diagnosis of SARS-CoV-2 by FDA under an Emergency Use Authorization (EUA).  This EUA  will remain in effect (m eaning this test can be used) for the duration of  the COVID-19 declaration under Section 564(b)(1) of the Act, 21 U.S.C. section 360-bbb-3(b)(1), unless the authorization is terminated or revoked sooner.  Performed at [facility], [address] [place], [place] [address]   Blood Culture (routine x 2)     Status: None (Preliminary result)   Collection Time: 08/02/20 11:27 PM   Specimen: BLOOD  Result Value Ref Range Status   Specimen Description BLOOD RIGHT ARM  Final   Special Requests   Final    BOTTLES DRAWN AEROBIC ONLY Blood Culture results may not be optimal due to an inadequate volume of blood received in culture bottles   Culture   Final    NO GROWTH 2 DAYS Performed at [facility], [place] [address] [place], [place] [address]    Report Status PENDING  Incomplete  Blood Culture (routine x 2)     Status: None (Preliminary result)   Collection Time: 08/03/20  2:26 AM   Specimen: BLOOD  Result Value Ref Range Status   Specimen Description BLOOD LEFT ARM  Final   Special Requests   Final    BOTTLES DRAWN AEROBIC AND ANAEROBIC Blood Culture adequate volume   Culture   Final    NO GROWTH 2 DAYS Performed at [facility], [place] [address], [place], [place] [address]    Report Status PENDING  Incomplete         Radiology Studies: No results found.      Scheduled Meds: . amLODipine  10 mg Oral Daily  . vitamin C  500 mg Oral Daily  . cholecalciferol  1,000 Units Oral Daily  . heparin  7,500 Units Subcutaneous Q8H  . insulin aspart  0-15 Units Subcutaneous TID WC  . insulin aspart  0-5 Units Subcutaneous QHS  . insulin detemir  7 Units Subcutaneous BID  . methylPREDNISolone (SOLU-MEDROL) injection  50 mg Intravenous BID  . zinc sulfate  220 mg Oral Daily   Continuous Infusions: . remdesivir 100 mg in NS 100 mL 100 mg (08/05/20 1005)  .  sodium bicarbonate (isotonic) infusion in sterile water Stopped  (08/04/20 2200)     LOS: 2 days    Time spent: 30 minutes    Barb Merino, MD [facility] Pager [phone]

## 2020-08-05 NOTE — Evaluation (Signed)
Physical Therapy Evaluation Patient Details Name: Veronica Simmons MRN: [id number] DOB: [birth year] Today's Date: 08/05/2020   History of Present Illness  Pt adm acute hypoxic respiratory failure due to covid PNA and acute renal failure. PMH - HTN, DM, diastolic heart failure.   Clinical Impression  Pt admitted with above diagnosis and presents to PT with functional limitations due to deficits listed below (See PT problem list). Pt needs skilled PT to maximize independence and safety to allow discharge to SNF. Pt weak and deconditioned and unable to care for herself at home and lives alone. Pt agreeable to SNF.      Follow Up Recommendations SNF    Equipment Recommendations  Other (comment);Rolling walker with 5" wheels (rollator vs rolling walker)    Recommendations for Other Services       Precautions / Restrictions Precautions Precautions: Fall      Mobility  Bed Mobility Overal bed mobility: Needs Assistance Bed Mobility: Sit to Supine       Sit to supine: Min assist   General bed mobility comments: assist to bring feet back up into bed  Transfers Overall transfer level: Needs assistance Equipment used: Rolling walker (2 wheeled) Transfers: Sit to/from Stand Sit to Stand: Min assist         General transfer comment: assist to bring hips up and for balance  Ambulation/Gait Ambulation/Gait assistance: Min assist Gait Distance (Feet): 20 Feet (20' x 1, 10' x 1) Assistive device: Rolling walker (2 wheeled) Gait Pattern/deviations: Step-through pattern;Decreased step length - right;Decreased step length - left;Shuffle Gait velocity: decr Gait velocity interpretation: <1.8 ft/sec, indicate of risk for recurrent falls General Gait Details: Assist for balance. Pt fatigues quickly and requires seated rest  Stairs            Wheelchair Mobility    Modified Rankin (Stroke Patients Only)       Balance Overall balance assessment: Needs  assistance Sitting-balance support: No upper extremity supported;Feet supported Sitting balance-Leahy Scale: Fair     Standing balance support: Single extremity supported Standing balance-Leahy Scale: Poor Standing balance comment: UE support and min guard for static standing                             Pertinent Vitals/Pain Pain Assessment: No/denies pain    Home Living Family/patient expects to be discharged to:: Private residence Living Arrangements: Alone   Type of Home: House Home Access: Stairs to enter Entrance Stairs-Rails: Right Entrance Stairs-Number of Steps: 4-5 Home Layout: One level Home Equipment: None      Prior Function Level of Independence: Independent               Hand Dominance   Dominant Hand: Right    Extremity/Trunk Assessment   Upper Extremity Assessment Upper Extremity Assessment: Generalized weakness    Lower Extremity Assessment Lower Extremity Assessment: Generalized weakness       Communication   Communication: No difficulties  Cognition Arousal/Alertness: Awake/alert Behavior During Therapy: WFL for tasks assessed/performed Overall Cognitive Status: Within Functional Limits for tasks assessed                                        General Comments General comments (skin integrity, edema, etc.): Pt on RA with SpO2 94-96%    Exercises     Assessment/Plan    PT Assessment Patient  needs continued PT services  PT Problem List Decreased strength;Decreased activity tolerance;Decreased balance;Decreased mobility;Decreased knowledge of use of DME       PT Treatment Interventions DME instruction;Gait training;Functional mobility training;Therapeutic activities;Therapeutic exercise;Balance training;Patient/family education    PT Goals (Current goals can be found in the Care Plan section)  Acute Rehab PT Goals Patient Stated Goal: get stronger PT Goal Formulation: With patient Time For Goal  Achievement: 08/19/20 Potential to Achieve Goals: Good    Frequency Min 3X/week   Barriers to discharge Decreased caregiver support lives alone    Co-evaluation               AM-PAC PT "6 Clicks" Mobility  Outcome Measure Help needed turning from your back to your side while in a flat bed without using bedrails?: A Little Help needed moving from lying on your back to sitting on the side of a flat bed without using bedrails?: A Little Help needed moving to and from a bed to a chair (including a wheelchair)?: A Little Help needed standing up from a chair using your arms (e.g., wheelchair or bedside chair)?: A Little Help needed to walk in hospital room?: A Little Help needed climbing 3-5 steps with a railing? : A Lot 6 Click Score: 17    End of Session   Activity Tolerance: Patient limited by fatigue Patient left: in bed;with call bell/phone within reach;with nursing/sitter in room   PT Visit Diagnosis: Unsteadiness on feet (R26.81);Muscle weakness (generalized) (M62.81);Other abnormalities of gait and mobility (R26.89)    Time: 9892-1194 PT Time Calculation (min) (ACUTE ONLY): 14 min   Charges:   PT Evaluation $PT Eval Moderate Complexity: [place] Pager [phone] Office [place] 08/05/2020, 5:26 PM

## 2020-08-05 NOTE — Consult Note (Signed)
Nephrology Consult   Requesting provider: Barb Merino Service requesting consult: Hospitalist Reason for consult: AKI on CKD   Assessment/Recommendations: Veronica Simmons is a/an 69 y.o. female with a past medical history HTN, HLD, DM 2, anemia, diastolic heart failure, CKD who present w/ COVID PNA c/b AKI  Worsening severe nonoliguric AKI: Likely secondary to significant dehydration and possible ATN related to acute illness causing tubular damage and renal dysfunction.  Despite a normal GFR at baseline her proteinuria indicates that she is at high risk for AKI with any kind illness.  Patient's urine output remains relatively adequate and her oxygen status is stable but her creatinine continues to rise.  I hope that we can delay the need for dialysis to give her time to improve. -Continue with IV hydration with bicarbonate as below -Chart reviewed: Current medications acceptable. -Continue to monitor daily Cr, Dose meds for GFR -Monitor Daily I/Os, Daily weight  -Maintain MAP>65 for optimal renal perfusion.  -Avoid nephrotoxic medications including NSAIDs and Vanc/Zosyn combo -No sign of obstruction on renal ultrasound -Currently no indication for hemodialysis but continue to monitor  Mild anion gap metabolic acidosis with NAGMA: Likely related to renal dysfunction.  Continue IV supplemental bicarb as ordered.  Hyponatremia: Mild hyponatremia present with sodium of 131 corrects to around 134 when accounting for hyperglycemia.  Hypertension: Mild elevation in blood pressure likely related to steroids.  Continue to monitor  Covid PNA/CAP: Defer management to primary team.  Supplemental oxygen as needed  Uncontrolled Diabetes Mellitus Type 2 with Hyperglycemia: Exacerbated by steroids.  Titration of insulin per primary team   Recommendations conveyed to primary service.    [facility] 08/05/2020 10:53  AM   _____________________________________________________________________________________ CC: AKI on CKD  History of Present Illness: Veronica Simmons is a/an 69 y.o. female with a past medical history of HTN, HLD, DM 2, anemia, diastolic heart failure, CKD who presents with Covid PNA.  Patient presented to the hospital on 8/17.  She was seen in the emergency department on 8/13 and was found to be Covid positive.  She was complaining of shortness of breath as well as chills and overall not feeling well.  She also had a poor appetite.  Is unclear how much she was drinking or eating before she came in.  She was not having overt fevers, cough, diarrhea.  She had received 1 dose of the Covid vaccine before this occurred.  Because of worsening symptoms she came back to the emergency department on 8/17.  At that time she was found to be febrile and was hypoxic.  Creatinine at that time was 4.15.  She was mated to the hospital for further management.  She was given 1 L normal saline at that time.  It was thought that she was dehydrated so she continued with IV hydration but despite this her creatinine has continued to increase.  Her creatinine is 6.3 today.  On my interview today she states that she continues to urinate.  She has not seen blood in her urine.  She denies NSAID use prior to admission. She mainly just mainly just feels very tired.  She is on antibiotics for possible associated bacterial pneumonia.  Her blood sugars have been difficult to control due to steroid use.  Renal ultrasound without acute abnormality.  Urinalysis with proteinuria.  Medications:  Current Facility-Administered Medications  Medication Dose Route Frequency Provider Last Rate Last Admin  . acetaminophen (TYLENOL) tablet 650 mg  650 mg Oral Q6H  PRN Shela Leff, MD   650 mg at 08/04/20 0708  . ascorbic acid (VITAMIN C) tablet 500 mg  500 mg Oral Daily Shela Leff, MD   500 mg at 08/05/20 1001  .  azithromycin (ZITHROMAX) 500 mg in sodium chloride 0.9 % 250 mL IVPB  500 mg Intravenous QHS Shela Leff, MD 250 mL/hr at 08/05/20 0335 500 mg at 08/05/20 0335  . cefTRIAXone (ROCEPHIN) 1 g in sodium chloride 0.9 % 100 mL IVPB  1 g Intravenous QHS Shela Leff, MD   Stopped at 08/05/20 0017  . chlorpheniramine-HYDROcodone (TUSSIONEX) 10-8 MG/5ML suspension 5 mL  5 mL Oral Q12H PRN Shela Leff, MD      . cholecalciferol (VITAMIN D3) tablet 1,000 Units  1,000 Units Oral Daily Shela Leff, MD   1,000 Units at 08/05/20 1001  . guaiFENesin-dextromethorphan (ROBITUSSIN DM) 100-10 MG/5ML syrup 10 mL  10 mL Oral Q4H PRN Shela Leff, MD   10 mL at 08/05/20 0023  . heparin injection 7,500 Units  7,500 Units Subcutaneous Q8H Shela Leff, MD   7,500 Units at 08/05/20 [phone]  . insulin aspart (novoLOG) injection 0-15 Units  0-15 Units Subcutaneous TID WC Shela Leff, MD   5 Units at 08/05/20 1003  . insulin aspart (novoLOG) injection 0-5 Units  0-5 Units Subcutaneous QHS Shela Leff, MD   4 Units at 08/04/20 2227  . insulin detemir (LEVEMIR) injection 7 Units  7 Units Subcutaneous BID Barb Merino, MD   7 Units at 08/05/20 1002  . loperamide (IMODIUM) capsule 2 mg  2 mg Oral Q4H PRN Barb Merino, MD   2 mg at 08/04/20 1721  . methylPREDNISolone sodium succinate (SOLU-MEDROL) 125 mg/2 mL injection 50 mg  50 mg Intravenous BID Shela Leff, MD   50 mg at 08/05/20 1003  . remdesivir 100 mg in sodium chloride 0.9 % 100 mL IVPB  100 mg Intravenous Daily Shela Leff, MD 200 mL/hr at 08/05/20 1005 100 mg at 08/05/20 1005  . sodium bicarbonate 150 mEq in sterile water 1,000 mL infusion   Intravenous Continuous Barb Merino, MD   Stopped at 08/04/20 2200  . zinc sulfate capsule 220 mg  220 mg Oral Daily Shela Leff, MD   220 mg at 08/05/20 1001     ALLERGIES Levofloxacin  MEDICAL HISTORY Past Medical History:  Diagnosis Date  . ALLERGIC  RHINITIS 02/19/2007   Qualifier: Diagnosis of  By: Lorne Skeens MD, VALENICA    . Allergy    seasonal  . Anemia 1970s   on iron   . Arthritis   . Cubital tunnel syndrome on right 2020   Secondary to old fracture. Evaluated by ortho. [facility] offered surgery but patient refused. Numbness and burning pain 4th and 5th digit.    . Diabetes mellitus 2000   Never on insulin   . High cholesterol   . Hypertension 2000     SOCIAL HISTORY Social History   Socioeconomic History  . Marital status: Divorced    Spouse name: Not on file  . Number of children: 4  . Years of education: Masters Cu  . Highest education level: Not on file  Occupational History  . 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 Smoking status: Never Smoker  . Smokeless tobacco: Never Used  Vaping Use  . Vaping Use: Never used  Substance and Sexual Activity  . Alcohol use: No  . Drug use: No  .  Sexual activity: Not Currently    Birth control/protection: Post-menopausal  Other Topics Concern  . Not on file  Social History Narrative         Social Determinants of Health   Financial Resource Strain:   . Difficulty of Paying Living Expenses: Not on file  Food Insecurity:   . Worried About Charity fundraiser in the Last Year: Not on file  . Ran Out of Food in the Last Year: Not on file  Transportation Needs:   . Lack of Transportation (Medical): Not on file  . Lack of Transportation (Non-Medical): Not on file  Physical Activity:   . Days of Exercise per Week: Not on file  . Minutes of Exercise per Session: Not on file  Stress:   . Feeling of Stress : Not on file  Social Connections:   . Frequency of Communication with Friends and Family: Not on file  . Frequency of Social Gatherings with Friends and Family: Not on file  . Attends Religious Services: Not on file  . Active Member of Clubs or Organizations: Not on file  . Attends Theatre manager Meetings: Not on file  . Marital Status: Not on file  Intimate Partner Violence:   . Fear of Current or Ex-Partner: Not on file  . Emotionally Abused: Not on file  . Physically Abused: Not on file  . Sexually Abused: Not on file     FAMILY HISTORY Family History  Problem Relation Age of Onset  . Early death Mother 75       shot   . Heart disease Father 75  . Diabetes Daughter   . Kidney disease Daughter        on dialysis   . Colon polyps Daughter   . Diabetes Maternal Aunt   . Cancer Paternal Uncle        Breast Cancer   . Breast cancer Maternal Grandmother   . Colon cancer Neg Hx   . Esophageal cancer Neg Hx   . Stomach cancer Neg Hx   . Rectal cancer Neg Hx       Review of Systems: 12 systems reviewed Otherwise as per HPI, all other systems reviewed and negative  Physical Exam: Vitals:   08/05/20 0403 08/05/20 0802  BP: (!) 149/66   Pulse: 67   Resp: 18   Temp: 98.5 F (36.9 C) 97.9 F (36.6 C)  SpO2: 92%    Total I/O In: 1245.4 [P.O.:240; I.V.:1005.4] Out: -   Intake/Output Summary (Last 24 hours) at 08/05/2020 1053 Last data filed at 08/05/2020 0900 Gross per 24 hour  Intake 1945.37 ml  Output 1100 ml  Net 845.37 ml   General: Ill-appearing, sitting in chair, no distress HEENT: anicteric sclera, oropharynx clear without lesions CV: Normal rate, no peripheral edema Lungs: Bilateral chest rise no increased work of breathing Abd: soft, non-tender, non-distended Skin: no visible lesions or rashes Psych: alert, engaged, appropriate mood and affect Musculoskeletal: no obvious deformities Neuro: normal speech, no gross focal deficits   Test Results Reviewed Lab Results  Component Value Date   NA 131 (L) 08/05/2020   K 4.0 08/05/2020   CL 98 08/05/2020   CO2 17 (L) 08/05/2020   BUN 97 (H) 08/05/2020   CREATININE 6.28 (H) 08/05/2020   CALCIUM 7.8 (L) 08/05/2020   ALBUMIN 1.8 (L) 08/05/2020   PHOS 3.6 10/18/2019     I have  reviewed all relevant outside healthcare records related to the patient's kidney injury.

## 2020-08-06 LAB — COMPREHENSIVE METABOLIC PANEL
ALT: 26 U/L (ref 0–44)
AST: 49 U/L — ABNORMAL HIGH (ref 15–41)
Albumin: 1.9 g/dL — ABNORMAL LOW (ref 3.5–5.0)
Alkaline Phosphatase: 42 U/L (ref 38–126)
Anion gap: 18 — ABNORMAL HIGH (ref 5–15)
BUN: 111 mg/dL — ABNORMAL HIGH (ref 8–23)
CO2: 17 mmol/L — ABNORMAL LOW (ref 22–32)
Calcium: 8.1 mg/dL — ABNORMAL LOW (ref 8.9–10.3)
Chloride: 98 mmol/L (ref 98–111)
Creatinine, Ser: 6.98 mg/dL — ABNORMAL HIGH (ref 0.44–1.00)
GFR calc Af Amer: 6 mL/min — ABNORMAL LOW (ref >60)
GFR calc non Af Amer: 5 mL/min — ABNORMAL LOW (ref >60)
Glucose, Bld: 166 mg/dL — ABNORMAL HIGH (ref 70–99)
Potassium: 3.7 mmol/L (ref 3.5–5.1)
Sodium: 133 mmol/L — ABNORMAL LOW (ref 135–145)
Total Bilirubin: 0.8 mg/dL (ref 0.3–1.2)
Total Protein: 6 g/dL — ABNORMAL LOW (ref 6.5–8.1)

## 2020-08-06 LAB — CBC WITH DIFFERENTIAL/PLATELET
Abs Immature Granulocytes: 0 10*3/uL (ref 0.00–0.07)
Basophils Absolute: 0 10*3/uL (ref 0.0–0.1)
Basophils Relative: 0 %
Eosinophils Absolute: 0 10*3/uL (ref 0.0–0.5)
Eosinophils Relative: 0 %
HCT: 29.4 % — ABNORMAL LOW (ref 36.0–46.0)
Hemoglobin: 10 g/dL — ABNORMAL LOW (ref 12.0–15.0)
Lymphocytes Relative: 8 %
Lymphs Abs: 0.7 10*3/uL (ref 0.7–4.0)
MCH: 28.2 pg (ref 26.0–34.0)
MCHC: 34 g/dL (ref 30.0–36.0)
MCV: 83.1 fL (ref 80.0–100.0)
Monocytes Absolute: 0.6 10*3/uL (ref 0.1–1.0)
Monocytes Relative: 7 %
Neutro Abs: 7.1 10*3/uL (ref 1.7–7.7)
Neutrophils Relative %: 85 %
Platelets: 545 10*3/uL — ABNORMAL HIGH (ref 150–400)
RBC: 3.54 MIL/uL — ABNORMAL LOW (ref 3.87–5.11)
RDW: 13.6 % (ref 11.5–15.5)
WBC: 8.4 10*3/uL (ref 4.0–10.5)
nRBC: 0 /100 WBC
nRBC: 0.2 % (ref 0.0–0.2)

## 2020-08-06 LAB — GLUCOSE, CAPILLARY
Glucose-Capillary: 131 mg/dL — ABNORMAL HIGH (ref 70–99)
Glucose-Capillary: 194 mg/dL — ABNORMAL HIGH (ref 70–99)
Glucose-Capillary: 172 mg/dL — ABNORMAL HIGH (ref 70–99)
Glucose-Capillary: 200 mg/dL — ABNORMAL HIGH (ref 70–99)

## 2020-08-06 LAB — D-DIMER, QUANTITATIVE: D-Dimer, Quant: 2.82 ug/mL-FEU — ABNORMAL HIGH (ref 0.00–0.50)

## 2020-08-06 LAB — PROCALCITONIN: Procalcitonin: 0.58 ng/mL

## 2020-08-06 LAB — C-REACTIVE PROTEIN: CRP: 3.8 mg/dL — ABNORMAL HIGH (ref <1.0)

## 2020-08-06 MED ORDER — LACTATED RINGERS IV SOLN: INTRAVENOUS | Status: DC

## 2020-08-06 MED ORDER — PROMETHAZINE HCL 25 MG/ML IJ SOLN
12.5000 mg | Freq: Four times a day (QID) | INTRAMUSCULAR | Status: DC | PRN
  Administered 2020-08-06: 12.5 mg via INTRAVENOUS
  Filled 2020-08-06: qty 1

## 2020-08-06 NOTE — Progress Notes (Signed)
Inpatient Diabetes Program Recommendations  AACE/ADA: New Consensus Statement on Inpatient Glycemic Control (2015)  Target Ranges:  Prepandial:   less than 140 mg/dL      Peak postprandial:   less than 180 mg/dL (1-2 hours)      Critically ill patients:  140 - 180 mg/dL   Lab Results  Component Value Date   GLUCAP 131 (H) 08/06/2020   HGBA1C 8.3 (H) 08/04/2020    Review of Glycemic Control Results for Veronica Simmons, Veronica Simmons (MRN [id number]) as of 08/06/2020 11:49  Ref. Range 08/05/2020 07:37 08/05/2020 11:56 08/05/2020 16:41 08/05/2020 21:19 08/06/2020 07:45  Glucose-Capillary Latest Ref Range: 70 - 99 mg/dL 250 (H) 304 (H) 284 (H) 300 (H) 131 (H)   Diabetes history: DM 2 Outpatient Diabetes medications: Janumet 50-1000 mg daily, Metformin 2000 mg Daily Current orders for Inpatient glycemic control:  Levemir 7 units bid Novolog 0-15 units tid + hs  Solumedrol 50 mg bid A1c 8.4% on 8/17  Inpatient Diabetes Program Recommendations:    Glucose 131  - Add Tradjenta 5 mg Daily  Renal function worsening. Glucose trends lower without change in insulin probably due to insulin stacking watch trends for now.  Thanks,  Tama Headings RN, MSN, BC-ADM Inpatient Diabetes Coordinator Team Pager [phone] (8a-5p)

## 2020-08-06 NOTE — Progress Notes (Signed)
PROGRESS NOTE    Veronica Simmons  TDD:[id number] DOB: [birth year] DOA: 08/02/2020 PCP: Katherina Mires, MD    Brief Narrative:  69 year old female with history of hypertension, hyperlipidemia, type 2 diabetes, anemia, chronic diastolic heart failure presented with shortness of breath and poor oral intake.  She was seen in the emergency room on 8/13 and tested positive for Covid.  She continued to have shortness of breath, chills and poor appetite. At the emergency room, temperature 100.5.  86% on room air.  Needed 2 L oxygen.  Creatinine 4.15.  Baseline creatinine 0.9 few days ago.  Chest x-ray showed progressive bilateral airspace disease new since 8/13.  Admitted with acute renal failure and COVID-19 pneumonia.   Assessment & Plan:   Principal Problem:   Pneumonia due to COVID-19 virus Active Problems:   Acute respiratory failure with hypoxemia (HCC)   AKI (acute kidney injury) ([place])   Hyponatremia  Pneumonia due to COVID-19 virus: Continue to monitor due to significant symptoms  chest physiotherapy, incentive spirometry, deep breathing exercises, sputum induction, mucolytic's and bronchodilators. Supplemental oxygen to keep saturations more than 90%. Covid directed therapy with , steroids Solu-Medrol remdesivir day 4/5 antibiotics , not indicated. Due to severity of symptoms, patient will need daily inflammatory markers, chest x-rays, liver function test to monitor and direct COVID-19 therapies. Inflammatory markers improving.  COVID-19 Labs  Recent Labs    08/04/20 0404 08/05/20 0500 08/06/20 0922  DDIMER 5.86* 4.20* 2.82*  CRP 12.1* 6.6* 3.8*    Lab Results  Component Value Date   SARSCOV2NAA POSITIVE (A) 07/30/2020   SARSCOV2NAA RESULT: NEGATIVE 07/15/2020   [place] Not Detected 10/27/2019   [place] NEGATIVE 10/17/2019   SpO2: 96 % O2 Flow Rate (L/min): 2 L/min  Acute kidney injury: Patient continues to have severe nonoliguric acute kidney  injury due to significant dehydration and ATN.  Renal ultrasound was without any hydronephrosis or obstruction.  It does show chronic kidney disease. Recent creatinine was 0.95 on 10/31.   Followed by nephrology. On bicarb fluids, changed to [place]inger lactate today. Patient continues to have worsening renal functions, closely watching. As per nephrology.  Hyponatremia: Due to above.  Continue to monitor. Improving.  Hypertension: Blood pressures fairly stable. Amlodipine resumed.  Hyperglycemia in the setting of noninsulin-dependent type 2 diabetes: Now with steroid use: On Janumet at home. Remains on insulin. Long-acting insulin added.   DVT prophylaxis: heparin injection 7,500 Units Start: 08/03/20 1400   Code Status: Full code Family Communication: Patient does not want me to call her children. She will call. Disposition Plan: Status is: Inpatient  Remains inpatient appropriate because:Persistent severe electrolyte disturbances, IV treatments appropriate due to intensity of illness or inability to take PO and Inpatient level of care appropriate due to severity of illness   Dispo: The patient is from: Home              Anticipated d/c is to: Skilled nursing facility.              Anticipated d/c date is: 3 days              Patient currently is not medically stable to d/c.      Consultants:   Nephrology,   Procedures:   None  Antimicrobials:  Antibiotics Given (last 72 hours)    Date/Time Action Medication Dose Rate   08/03/20 2222 New Bag/Given   cefTRIAXone (ROCEPHIN) 1 g in sodium chloride 0.9 % 100 mL IVPB 1 g 200 mL/hr  08/04/20 0015 New Bag/Given   azithromycin (ZITHROMAX) 500 mg in sodium chloride 0.9 % 250 mL IVPB 500 mg 250 mL/hr   08/04/20 1254 New Bag/Given   remdesivir 100 mg in sodium chloride 0.9 % 100 mL IVPB 100 mg 200 mL/hr   08/04/20 2343 New Bag/Given   cefTRIAXone (ROCEPHIN) 1 g in sodium chloride 0.9 % 100 mL IVPB 1 g 200 mL/hr   08/05/20 0335  New Bag/Given   azithromycin (ZITHROMAX) 500 mg in sodium chloride 0.9 % 250 mL IVPB 500 mg 250 mL/hr   08/05/20 1005 New Bag/Given   remdesivir 100 mg in sodium chloride 0.9 % 100 mL IVPB 100 mg 200 mL/hr   08/06/20 0820 New Bag/Given   remdesivir 100 mg in sodium chloride 0.9 % 100 mL IVPB 100 mg 200 mL/hr         Subjective: Patient seen and examined. Weak and tired. Has some nausea. Afebrile. Mostly on room air. Reported 500 mL urine output last 24 hours.  Objective: Vitals:   08/05/20 1312 08/05/20 2001 08/06/20 0400 08/06/20 1316  BP:  (!) 149/73 140/79 138/61  Pulse:  72 60 67  Resp:  19 15 19   Temp: 97.7 F (36.5 C) 97.7 F (36.5 C) 97.7 F (36.5 C) 97.6 F (36.4 C)  TempSrc:  Oral Axillary Oral  SpO2:  100% 99% 96%  Weight:      Height:        Intake/Output Summary (Last 24 hours) at 08/06/2020 1449 Last data filed at 08/06/2020 0726 Gross per 24 hour  Intake 920 ml  Output 500 ml  Net 420 ml   Filed Weights   08/04/20 2339  Weight: 104.7 kg    Examination:  General exam: Comfortable but anxious and sick looking. Respiratory system: Clear to auscultation. Respiratory effort normal.  No added sounds. Cardiovascular system: S1 & S2 heard, RRR.  No edema. Gastrointestinal system: Abdomen is nondistended, soft and nontender. No organomegaly or masses felt. Normal bowel sounds heard.  Obese and pendulous. Central nervous system: Alert and oriented. No focal neurological deficits. Extremities: Symmetric 5 x 5 power. Skin: No rashes, lesions or ulcers Psychiatry: Judgement and insight appear normal. Mood & affect anxious.    Data Reviewed: I have personally reviewed following labs and imaging studies  CBC: Recent Labs  Lab 07/30/20 1756 08/02/20 2327 08/04/20 0404 08/05/20 0500 08/06/20 0922  WBC 4.0 6.3 5.6 9.9 8.4  NEUTROABS 2.7 5.0 4.7 9.2* 7.1  HGB 10.0* 9.1* 9.1* 10.2* 10.0*  HCT 31.1* 28.5* 28.3* 30.8* 29.4*  MCV 89.6 88.8 86.0 84.2 83.1   PLT 196 305 300 422* 491*   Basic Metabolic Panel: Recent Labs  Lab 08/04/20 0404 08/04/20 1303 08/04/20 2223 08/05/20 0500 08/06/20 0922  NA 128* 129* 127* 131* 133*  K 3.9 4.0 4.0 4.0 3.7  CL 98 96* 95* 98 98  CO2 17* 17* 19* 17* 17*  GLUCOSE 266* 327* 320* 262* 166*  BUN 78* 87* 93* 97* 111*  CREATININE 5.28* 5.62* 5.92* 6.28* 6.98*  CALCIUM 7.4* 7.8* 7.4* 7.8* 8.1*   GFR: Estimated Creatinine Clearance: 9.1 mL/min (A) (by C-G formula based on SCr of 6.98 mg/dL (H)). Liver Function Tests: Recent Labs  Lab 08/02/20 2327 08/04/20 0404 08/05/20 0500 08/06/20 0922  AST 85* 68* 56* 49*  ALT 29 29 27 26   ALKPHOS 33* 33* 38 42  BILITOT 0.7 0.6 0.6 0.8  PROT 6.1* 5.6* 6.1* 6.0*  ALBUMIN 2.0* 1.5* 1.8* 1.9*   No  results for input(s): LIPASE, AMYLASE in the last 168 hours. No results for input(s): AMMONIA in the last 168 hours. Coagulation Profile: No results for input(s): INR, PROTIME in the last 168 hours. Cardiac Enzymes: No results for input(s): CKTOTAL, CKMB, CKMBINDEX, TROPONINI in the last 168 hours. BNP (last 3 results) No results for input(s): PROBNP in the last 8760 hours. HbA1C: Recent Labs    08/04/20 0404  HGBA1C 8.3*   CBG: Recent Labs  Lab 08/05/20 1156 08/05/20 1641 08/05/20 2119 08/06/20 0745 08/06/20 1206  GLUCAP 304* 284* 300* 131* 194*   Lipid Profile: No results for input(s): CHOL, HDL, LDLCALC, TRIG, CHOLHDL, LDLDIRECT in the last 72 hours. Thyroid Function Tests: No results for input(s): TSH, T4TOTAL, FREET4, T3FREE, THYROIDAB in the last 72 hours. Anemia Panel: No results for input(s): VITAMINB12, FOLATE, FERRITIN, TIBC, IRON, RETICCTPCT in the last 72 hours. Sepsis Labs: Recent Labs  Lab 08/02/20 2327 08/02/20 2327 08/03/20 0226 08/03/20 0319 08/04/20 0404 08/05/20 0500 08/06/20 0922  PROCALCITON 0.53   < >  --  0.65 0.72 0.70 0.58  LATICACIDVEN 1.9  --  1.5  --   --   --   --    < > = values in this interval not  displayed.    Recent Results (from the past 240 hour(s))  SARS Coronavirus 2 by RT PCR (hospital order, performed in [facility] hospital lab) Nasopharyngeal Nasopharyngeal Swab     Status: Abnormal   Collection Time: 07/30/20  6:40 PM   Specimen: Nasopharyngeal Swab  Result Value Ref Range Status   SARS Coronavirus 2 POSITIVE (A) NEGATIVE Final    Comment: CRITICAL RESULT CALLED TO, READ BACK BY AND VERIFIED WITH: FRANKLIN,C.,RN @ 2153 07/30/20 TURNER,S. (NOTE) SARS-CoV-2 target nucleic acids are DETECTED  SARS-CoV-2 RNA is generally detectable in upper respiratory specimens  during the acute phase of infection.  Positive results are indicative  of the presence of the identified virus, but do not rule out bacterial infection or co-infection with other pathogens not detected by the test.  Clinical correlation with patient history and  other diagnostic information is necessary to determine patient infection status.  The expected result is negative.  Fact Sheet for Patients:   StrictlyIdeas.no   Fact Sheet for Healthcare Providers:   BankingDealers.co.za    This test is not yet approved or cleared by the [place] FDA and  has been authorized for detection and/or diagnosis of SARS-CoV-2 by FDA under an Emergency Use Authorization (EUA).  This EUA will remain in effect (m eaning this test can be used) for the duration of  the COVID-19 declaration under Section 564(b)(1) of the Act, 21 U.S.C. section 360-bbb-3(b)(1), unless the authorization is terminated or revoked sooner.  Performed at [facility], [place] [address], [place], [place] [address]   Blood Culture (routine x 2)     Status: None (Preliminary result)   Collection Time: 08/02/20 11:27 PM   Specimen: BLOOD  Result Value Ref Range Status   Specimen Description BLOOD RIGHT ARM  Final   Special Requests   Final    BOTTLES DRAWN AEROBIC ONLY Blood Culture  results may not be optimal due to an inadequate volume of blood received in culture bottles   Culture   Final    NO GROWTH 3 DAYS Performed at [facility], [place] [address], [place], [place] [address]    Report Status PENDING  Incomplete  Blood Culture (routine x 2)     Status:  None (Preliminary result)   Collection Time: 08/03/20  2:26 AM   Specimen: BLOOD  Result Value Ref Range Status   Specimen Description BLOOD LEFT ARM  Final   Special Requests   Final    BOTTLES DRAWN AEROBIC AND ANAEROBIC Blood Culture adequate volume   Culture   Final    NO GROWTH 3 DAYS Performed at [facility], [address], [place], [place] [address]    Report Status PENDING  Incomplete         Radiology Studies: No results found.      Scheduled Meds: . amLODipine  10 mg Oral Daily  . vitamin C  500 mg Oral Daily  . cholecalciferol  1,000 Units Oral Daily  . heparin  7,500 Units Subcutaneous Q8H  . insulin aspart  0-15 Units Subcutaneous TID WC  . insulin aspart  0-5 Units Subcutaneous QHS  . insulin detemir  7 Units Subcutaneous BID  . methylPREDNISolone (SOLU-MEDROL) injection  50 mg Intravenous BID  . zinc sulfate  220 mg Oral Daily   Continuous Infusions: . lactated ringers    . remdesivir 100 mg in NS 100 mL 100 mg (08/06/20 0820)     LOS: 3 days    Time spent: 30 minutes    Barb Merino, MD [facility] Pager [phone]

## 2020-08-06 NOTE — Progress Notes (Signed)
Nephrology Follow-Up Consult note   Assessment/Recommendations: Veronica Simmons is a/an 69 y.o. female with a past medical history significant for HTN, HLD, DM 2, anemia, diastolic heart failure, CKD  G1A3 who present w/ COVID PNA c/b AKI  Worsening severe nonoliguric AKI on CKD G1 A3: Likely secondary to significant dehydration and possible ATN related to acute illness causing tubular damage and renal dysfunction. Urine output not well documented but creatinine appears to be plateauing -Continue with IV hydration -> switch IV bicarbonate to lactated Ringer's 125 cc/h -Consider IV albumin given her serum albumin is 1.9 -Hopefully should see improvement in the next 1 to 2 days -Continue to monitor daily Cr, Dose meds for GFR -Monitor Daily I/Os, Daily weight  -Maintain MAP>65 for optimal renal perfusion.  -Avoid nephrotoxic medications including NSAIDs and Vanc/Zosyn combo -No sign of obstruction on renal ultrasound -Currently no indication for hemodialysis but continue to monitor  Mild anion gap metabolic acidosis with NAGMA: Likely related to renal dysfunction. Switching sodium bicarb to lactated Ringer's as above  Hyponatremia: Mild hyponatremia of 133 likely related to free water retention in the setting of AKI.  Hypertension: Mild elevation in blood pressure likely related to steroids.  Continue to monitor  Covid PNA/CAP: Defer management to primary team.  Supplemental oxygen as needed  Uncontrolled Diabetes Mellitus Type 2 with Hyperglycemia: Exacerbated by steroids.  Titration of insulin per primary team   Recommendations conveyed to primary service.    [facility] 08/06/2020 1:00 PM  ___________________________________________________________  CC: AKI  Interval History/Subjective: Patient states she feels slightly better over the past 24 hours. Otherwise no significant changes.   Medications:  Current Facility-Administered  Medications  Medication Dose Route Frequency Provider Last Rate Last Admin  . acetaminophen (TYLENOL) tablet 650 mg  650 mg Oral Q6H PRN Shela Leff, MD   650 mg at 08/04/20 0708  . amLODipine (NORVASC) tablet 10 mg  10 mg Oral Daily Barb Merino, MD   10 mg at 08/06/20 0809  . ascorbic acid (VITAMIN C) tablet 500 mg  500 mg Oral Daily Shela Leff, MD   500 mg at 08/06/20 0809  . chlorpheniramine-HYDROcodone (TUSSIONEX) 10-8 MG/5ML suspension 5 mL  5 mL Oral Q12H PRN Shela Leff, MD   5 mL at 08/05/20 1706  . cholecalciferol (VITAMIN D3) tablet 1,000 Units  1,000 Units Oral Daily Shela Leff, MD   1,000 Units at 08/06/20 0809  . guaiFENesin-dextromethorphan (ROBITUSSIN DM) 100-10 MG/5ML syrup 10 mL  10 mL Oral Q4H PRN Shela Leff, MD   10 mL at 08/05/20 0023  . heparin injection 7,500 Units  7,500 Units Subcutaneous Q8H Shela Leff, MD   7,500 Units at 08/06/20 1220  . insulin aspart (novoLOG) injection 0-15 Units  0-15 Units Subcutaneous TID WC Shela Leff, MD   3 Units at 08/06/20 1219  . insulin aspart (novoLOG) injection 0-5 Units  0-5 Units Subcutaneous QHS Shela Leff, MD   3 Units at 08/05/20 2148  . insulin detemir (LEVEMIR) injection 7 Units  7 Units Subcutaneous BID Barb Merino, MD   7 Units at 08/06/20 0810  . lactated ringers infusion   Intravenous Continuous Reesa Chew, MD      . loperamide (IMODIUM) capsule 2 mg  2 mg Oral Q4H PRN Barb Merino, MD   2 mg at 08/04/20 1721  . methylPREDNISolone sodium succinate (SOLU-MEDROL) 125 mg/2 mL injection 50 mg  50 mg Intravenous BID Shela Leff, MD   50 mg at  08/06/20 0810  . promethazine (PHENERGAN) injection 12.5 mg  12.5 mg Intravenous Q6H PRN Barb Merino, MD   12.5 mg at 08/06/20 1220  . remdesivir 100 mg in sodium chloride 0.9 % 100 mL IVPB  100 mg Intravenous Daily Shela Leff, MD 200 mL/hr at 08/06/20 0820 100 mg at 08/06/20 0820  . zinc sulfate capsule  220 mg  220 mg Oral Daily Shela Leff, MD   220 mg at 08/06/20 3568      Review of Systems: 10 systems reviewed and negative except per interval history/subjective  Physical Exam: Vitals:   08/05/20 2001 08/06/20 0400  BP: (!) 149/73 140/79  Pulse: 72 60  Resp: 19 15  Temp: 97.7 F (36.5 C) 97.7 F (36.5 C)  SpO2: 100% 99%   Total I/O In: -  Out: 500 [Urine:500]  Intake/Output Summary (Last 24 hours) at 08/06/2020 1300 Last data filed at 08/06/2020 6168 Gross per 24 hour  Intake 920 ml  Output 500 ml  Net 420 ml   Constitutional: well-appearing, no acute distress ENMT: ears and nose without scars or lesions, MMM CV: normal rate, no edema Respiratory: Bilateral chest rise, no increased work of breathing Gastrointestinal: soft, non-tender, no palpable masses or hernias Skin: no visible lesions or rashes Psych: alert, judgement/insight appropriate, appropriate mood and affect   Test Results I personally reviewed new and old clinical labs and radiology tests Lab Results  Component Value Date   NA 133 (L) 08/06/2020   K 3.7 08/06/2020   CL 98 08/06/2020   CO2 17 (L) 08/06/2020   BUN 111 (H) 08/06/2020   CREATININE 6.98 (H) 08/06/2020   CALCIUM 8.1 (L) 08/06/2020   ALBUMIN 1.9 (L) 08/06/2020   PHOS 3.6 10/18/2019

## 2020-08-06 NOTE — TOC Progression Note (Signed)
Transition of Care [facility]) - Progression Note    Patient Details  Name: Veronica Simmons MRN: [id number] Date of Birth: 28-Sep-1951  Transition of Care [facility]) CM/SW [place], LCSW Phone Number: 08/06/2020, 3:02 PM  Clinical Narrative:    CSW received consult for possible SNF placement at time of discharge. CSW spoke with patient regarding PT recommendation of SNF placement at time of discharge and made her aware that [facility] the only available COVID SNF. Patient reported that she lives alone, but that she would rather get stronger at the hospital and return home instead of SNF. She stated that her sister and daughters would assist her at home. CSW will follow up with her closer to discharge to make sure patient will be safe going home. No further questions reported at this time.     Expected Discharge Plan: [place] Barriers to Discharge: Continued Medical Work up  Expected Discharge Plan and Services Expected Discharge Plan: [place] In-house Referral: Clinical Social Work     Living arrangements for the past 2 months: Single Family Home                                       Social Determinants of Health (SDOH) Interventions    Readmission Risk Interventions Readmission Risk Prevention Plan 08/06/2020  Transportation Screening Complete  PCP or Specialist Appt within 3-5 Days Complete  HRI or [place] Complete  Social Work Consult for [place] Planning/Counseling Complete  Palliative Care Screening Not Applicable  Medication Review Press photographer) Referral to Pharmacy  Some recent data might be hidden

## 2020-08-06 NOTE — NC FL2 (Signed)
[place] LEVEL OF CARE SCREENING TOOL     IDENTIFICATION  Patient Name: Veronica Simmons Birthdate: 09/23/1951 Sex: female Admission Date (Current Location): 08/02/2020  [facility] [place] Number:  Herbalist and Address:  [facility] [place], [place] [address], [place], [place] [address]      Provider Number: [id number]  Attending Physician Name and Address:  Barb Merino, MD  Relative Name and Phone Number:  Selinda Eon daughter, [phone]    Current Level of Care: Hospital Recommended Level of Care: [place] Prior Approval Number:    Date Approved/Denied:   PASRR Number: [id number]  Discharge Plan: SNF    Current Diagnoses: Patient Active Problem List   Diagnosis Date Noted  . Pneumonia due to COVID-19 virus 08/03/2020  . Acute respiratory failure with hypoxemia ([place]) 08/03/2020  . AKI (acute kidney injury) ([place]) 08/03/2020  . Hyponatremia 08/03/2020  . New onset of congestive heart failure ([place]) 10/17/2019  . Hypertensive urgency 10/17/2019  . Right sided weakness 03/07/2018  . HTN (hypertension) 03/07/2018  . Obesity (BMI 30-39.9) 03/07/2018  . Skin papules, generalized 08/04/2016  . Skin lesion of right lower limb 06/19/2016  . Leg swelling 05/12/2016  . Subconjunctival hemorrhage of right eye 04/27/2016  . Diarrhea 01/09/2016  . Health care maintenance 06/28/2015  . Anemia 12/21/2014  . Palpitations 01/07/2014  . Recurrent yeast vaginitis 11/05/2013  . Recurrent genital herpes 07/17/2013  . Hot flashes not due to menopause 11/27/2012  . Cubital tunnel syndrome on right 10/31/2012  . Type 2 diabetes, controlled, with neuropathy ([place]) 02/14/2007  . HYPERLIPIDEMIA 02/14/2007  . OBESITY, NOS 02/14/2007  . HYPERTENSION, BENIGN SYSTEMIC 02/14/2007  . ARTHRITIS 02/14/2007  . Insomnia 02/14/2007    Orientation RESPIRATION BLADDER Height & Weight     Self, Time, Situation, Place  Normal  Continent Weight: 230 lb 13.2 oz (104.7 kg) Height:  5\' 5"  (165.1 cm)  BEHAVIORAL SYMPTOMS/MOOD NEUROLOGICAL BOWEL NUTRITION STATUS      Continent Diet (Please see DC Summary)  AMBULATORY STATUS COMMUNICATION OF NEEDS Skin   Limited Assist Verbally Normal                       Personal Care Assistance Level of Assistance  Bathing, Feeding, Dressing Bathing Assistance: Limited assistance Feeding assistance: Independent Dressing Assistance: Limited assistance     Functional Limitations Info  Sight, Hearing, Speech Sight Info: Adequate Hearing Info: Adequate Speech Info: Adequate    SPECIAL CARE FACTORS FREQUENCY  PT (By licensed PT), OT (By licensed OT)     PT Frequency: 5x/week OT Frequency: 5x/week            Contractures Contractures Info: Not present    Additional Factors Info  Code Status, Allergies, Isolation Precautions, Insulin Sliding Scale Code Status Info: Full Allergies Info: Levofloxacin   Insulin Sliding Scale Info: See DC Summary for dose Isolation Precautions Info: COVID+     Current Medications (08/06/2020):  This is the current hospital active medication list Current Facility-Administered Medications  Medication Dose Route Frequency Provider Last Rate Last Admin  . acetaminophen (TYLENOL) tablet 650 mg  650 mg Oral Q6H PRN Shela Leff, MD   650 mg at 08/04/20 0708  . amLODipine (NORVASC) tablet 10 mg  10 mg Oral Daily Barb Merino, MD   10 mg at 08/06/20 0809  . ascorbic acid (VITAMIN C) tablet 500 mg  500 mg Oral Daily Shela Leff, MD   500 mg  at 08/06/20 0809  . chlorpheniramine-HYDROcodone (TUSSIONEX) 10-8 MG/5ML suspension 5 mL  5 mL Oral Q12H PRN Shela Leff, MD   5 mL at 08/05/20 1706  . cholecalciferol (VITAMIN D3) tablet 1,000 Units  1,000 Units Oral Daily Shela Leff, MD   1,000 Units at 08/06/20 0809  . guaiFENesin-dextromethorphan (ROBITUSSIN DM) 100-10 MG/5ML syrup 10 mL  10 mL Oral Q4H PRN Shela Leff, MD   10 mL at 08/05/20 0023  . heparin injection 7,500 Units  7,500 Units Subcutaneous Q8H Shela Leff, MD   7,500 Units at 08/06/20 0630  . insulin aspart (novoLOG) injection 0-15 Units  0-15 Units Subcutaneous TID WC Shela Leff, MD   2 Units at 08/06/20 0813  . insulin aspart (novoLOG) injection 0-5 Units  0-5 Units Subcutaneous QHS Shela Leff, MD   3 Units at 08/05/20 2148  . insulin detemir (LEVEMIR) injection 7 Units  7 Units Subcutaneous BID Barb Merino, MD   7 Units at 08/06/20 0810  . loperamide (IMODIUM) capsule 2 mg  2 mg Oral Q4H PRN Barb Merino, MD   2 mg at 08/04/20 1721  . methylPREDNISolone sodium succinate (SOLU-MEDROL) 125 mg/2 mL injection 50 mg  50 mg Intravenous BID Shela Leff, MD   50 mg at 08/06/20 0810  . remdesivir 100 mg in sodium chloride 0.9 % 100 mL IVPB  100 mg Intravenous Daily Shela Leff, MD 200 mL/hr at 08/06/20 0820 100 mg at 08/06/20 0820  . sodium bicarbonate 150 mEq in sterile water 1,000 mL infusion   Intravenous Continuous Barb Merino, MD   Stopped at 08/04/20 2200  . zinc sulfate capsule 220 mg  220 mg Oral Daily Shela Leff, MD   220 mg at 08/06/20 7829     Discharge Medications: Please see discharge summary for a list of discharge medications.  Relevant Imaging Results:  Relevant Lab Results:   Additional Information SSN: [id number]  Benard Halsted, LCSW

## 2020-08-07 LAB — CBC WITH DIFFERENTIAL/PLATELET
Abs Immature Granulocytes: 0.2 10*3/uL — ABNORMAL HIGH (ref 0.00–0.07)
Basophils Absolute: 0 10*3/uL (ref 0.0–0.1)
Basophils Relative: 0 %
Eosinophils Absolute: 0 10*3/uL (ref 0.0–0.5)
Eosinophils Relative: 0 %
HCT: 29.6 % — ABNORMAL LOW (ref 36.0–46.0)
Hemoglobin: 10.2 g/dL — ABNORMAL LOW (ref 12.0–15.0)
Immature Granulocytes: 3 %
Lymphocytes Relative: 8 %
Lymphs Abs: 0.6 10*3/uL — ABNORMAL LOW (ref 0.7–4.0)
MCH: 28.7 pg (ref 26.0–34.0)
MCHC: 34.5 g/dL (ref 30.0–36.0)
MCV: 83.1 fL (ref 80.0–100.0)
Monocytes Absolute: 0.2 10*3/uL (ref 0.1–1.0)
Monocytes Relative: 2 %
Neutro Abs: 6.8 10*3/uL (ref 1.7–7.7)
Neutrophils Relative %: 87 %
Platelets: 566 10*3/uL — ABNORMAL HIGH (ref 150–400)
RBC: 3.56 MIL/uL — ABNORMAL LOW (ref 3.87–5.11)
RDW: 13.6 % (ref 11.5–15.5)
WBC: 7.6 10*3/uL (ref 4.0–10.5)
nRBC: 0 % (ref 0.0–0.2)

## 2020-08-07 LAB — COMPREHENSIVE METABOLIC PANEL
ALT: 29 U/L (ref 0–44)
AST: 50 U/L — ABNORMAL HIGH (ref 15–41)
Albumin: 1.7 g/dL — ABNORMAL LOW (ref 3.5–5.0)
Alkaline Phosphatase: 44 U/L (ref 38–126)
Anion gap: 16 — ABNORMAL HIGH (ref 5–15)
BUN: 116 mg/dL — ABNORMAL HIGH (ref 8–23)
CO2: 18 mmol/L — ABNORMAL LOW (ref 22–32)
Calcium: 8.1 mg/dL — ABNORMAL LOW (ref 8.9–10.3)
Chloride: 97 mmol/L — ABNORMAL LOW (ref 98–111)
Creatinine, Ser: 7.17 mg/dL — ABNORMAL HIGH (ref 0.44–1.00)
GFR calc Af Amer: 6 mL/min — ABNORMAL LOW (ref >60)
GFR calc non Af Amer: 5 mL/min — ABNORMAL LOW (ref >60)
Glucose, Bld: 328 mg/dL — ABNORMAL HIGH (ref 70–99)
Potassium: 3.8 mmol/L (ref 3.5–5.1)
Sodium: 131 mmol/L — ABNORMAL LOW (ref 135–145)
Total Bilirubin: 0.5 mg/dL (ref 0.3–1.2)
Total Protein: 5.6 g/dL — ABNORMAL LOW (ref 6.5–8.1)

## 2020-08-07 LAB — GLUCOSE, CAPILLARY
Glucose-Capillary: 253 mg/dL — ABNORMAL HIGH (ref 70–99)
Glucose-Capillary: 277 mg/dL — ABNORMAL HIGH (ref 70–99)
Glucose-Capillary: 345 mg/dL — ABNORMAL HIGH (ref 70–99)
Glucose-Capillary: 298 mg/dL — ABNORMAL HIGH (ref 70–99)

## 2020-08-07 LAB — D-DIMER, QUANTITATIVE: D-Dimer, Quant: 2.37 ug/mL-FEU — ABNORMAL HIGH (ref 0.00–0.50)

## 2020-08-07 LAB — C-REACTIVE PROTEIN: CRP: 3.5 mg/dL — ABNORMAL HIGH (ref <1.0)

## 2020-08-07 LAB — PROCALCITONIN: Procalcitonin: 0.48 ng/mL

## 2020-08-07 MED ORDER — PREDNISONE 20 MG PO TABS
20.0000 mg | ORAL_TABLET | Freq: Every day | ORAL | Status: DC
  Administered 2020-08-08: 20 mg via ORAL
  Filled 2020-08-07: qty 1

## 2020-08-07 MED ORDER — SODIUM BICARBONATE 650 MG PO TABS
650.0000 mg | ORAL_TABLET | Freq: Two times a day (BID) | ORAL | Status: DC
  Administered 2020-08-07 – 2020-08-09 (×5): 650 mg via ORAL
  Filled 2020-08-07 (×6): qty 1

## 2020-08-07 NOTE — Progress Notes (Signed)
PROGRESS NOTE    Veronica Simmons  VHQ:[id number] DOB: September 17, 1951 DOA: 08/02/2020 PCP: Katherina Mires, MD    Brief Narrative:  69 year old female with history of hypertension, hyperlipidemia, type 2 diabetes, anemia, chronic diastolic heart failure presented with shortness of breath and poor oral intake.  She was seen in the emergency room on 8/13 and tested positive for Covid.  She continued to have shortness of breath, chills and poor appetite. At the emergency room, temperature 100.5.  86% on room air.  Needed 2 L oxygen.  Creatinine 4.15.  Baseline creatinine 0.9 few days ago.  Chest x-ray showed progressive bilateral airspace disease new since 8/13.  Admitted with acute renal failure and COVID-19 pneumonia.   Assessment & Plan:   Principal Problem:   Pneumonia due to COVID-19 virus Active Problems:   Acute respiratory failure with hypoxemia (HCC)   AKI (acute kidney injury) ([place])   Hyponatremia  Pneumonia due to COVID-19 virus: Continue to monitor due to significant symptoms  chest physiotherapy, incentive spirometry, deep breathing exercises, sputum induction, mucolytic's and bronchodilators. Supplemental oxygen to keep saturations more than 90%. Covid directed therapy with , steroids Solu-Medrol, decrease dose of steroids.  We will keep on maintenance prednisone 20 mg daily for next 5 days. remdesivir day 5/5 antibiotics , not indicated. Due to severity of symptoms, patient will need daily inflammatory markers, chest x-rays, liver function test to monitor and direct COVID-19 therapies. Inflammatory markers improving.  COVID-19 Labs  Recent Labs    08/05/20 0500 08/06/20 0922 08/07/20 0936  DDIMER 4.20* 2.82* 2.37*  CRP 6.6* 3.8* 3.5*    Lab Results  Component Value Date   SARSCOV2NAA POSITIVE (A) 07/30/2020   SARSCOV2NAA RESULT: NEGATIVE 07/15/2020   [place] Not Detected 10/27/2019   [place] NEGATIVE 10/17/2019   SpO2: 97 % O2 Flow Rate  (L/min): 2 L/min  Acute kidney injury: Patient continues to have severe nonoliguric acute kidney injury due to significant dehydration and ATN.  Renal ultrasound was without any hydronephrosis or obstruction.  It does show chronic kidney disease. Recent creatinine was 0.95 on 10/31.   Followed by nephrology. Currently on lactated Ringer.  Oral bicarb. Patient continues to have worsening renal functions, closely watching. As per nephrology.  Hyponatremia: Due to above.  Continue to monitor. Improving.  Hypertension: Blood pressures fairly stable. Amlodipine resumed.  Hyperglycemia in the setting of noninsulin-dependent type 2 diabetes: Now with steroid use: On Janumet at home. Remains on insulin. Long-acting insulin added.   DVT prophylaxis: heparin injection 7,500 Units Start: 08/03/20 1400   Code Status: Full code Family Communication: Patient does not want me to call her children. She will call. Disposition Plan: Status is: Inpatient  Remains inpatient appropriate because:Persistent severe electrolyte disturbances, IV treatments appropriate due to intensity of illness or inability to take PO and Inpatient level of care appropriate due to severity of illness   Dispo: The patient is from: Home              Anticipated d/c is to: Home with home health PT OT              Anticipated d/c date is: 2 days              Patient currently is not medically stable to d/c.   Consultants:   Nephrology,   Procedures:   None  Antimicrobials:  Antibiotics Given (last 72 hours)    Date/Time Action Medication Dose Rate   08/04/20 2343 New Bag/Given  cefTRIAXone (ROCEPHIN) 1 g in sodium chloride 0.9 % 100 mL IVPB 1 g 200 mL/hr   08/05/20 0335 New Bag/Given   azithromycin (ZITHROMAX) 500 mg in sodium chloride 0.9 % 250 mL IVPB 500 mg 250 mL/hr   08/05/20 1005 New Bag/Given   remdesivir 100 mg in sodium chloride 0.9 % 100 mL IVPB 100 mg 200 mL/hr   08/06/20 0820 New Bag/Given    remdesivir 100 mg in sodium chloride 0.9 % 100 mL IVPB 100 mg 200 mL/hr   08/07/20 0841 New Bag/Given   remdesivir 100 mg in sodium chloride 0.9 % 100 mL IVPB 100 mg 200 mL/hr         Subjective: Patient seen and examined. No overnight events.looking forward to go home .  Objective: Vitals:   08/06/20 1316 08/06/20 2001 08/06/20 2004 08/07/20 0506  BP: 138/61 (!) 149/77 (!) 164/78 (!) 148/71  Pulse: 67 76 69 70  Resp: 19 20 17 16   Temp: 97.6 F (36.4 C) 97.8 F (36.6 C) 97.8 F (36.6 C) 97.7 F (36.5 C)  TempSrc: Oral Oral  Oral  SpO2: 96% 96% 97% 97%  Weight:      Height:        Intake/Output Summary (Last 24 hours) at 08/07/2020 1326 Last data filed at 08/07/2020 1096 Gross per 24 hour  Intake 2326.32 ml  Output 400 ml  Net 1926.32 ml   Filed Weights   08/04/20 2339  Weight: 104.7 kg    Examination:  General exam: Comfortable but anxious and sick looking. Respiratory system: Clear to auscultation. Respiratory effort normal.  No added sounds. Cardiovascular system: S1 & S2 heard, RRR.  No edema. Gastrointestinal system: Abdomen is nondistended, soft and nontender. No organomegaly or masses felt. Normal bowel sounds heard.  Obese and pendulous. Central nervous system: Alert and oriented. No focal neurological deficits. Extremities: Symmetric 5 x 5 power. Skin: No rashes, lesions or ulcers Psychiatry: Judgement and insight appear normal. Mood & affect normal.    Data Reviewed: I have personally reviewed following labs and imaging studies  CBC: Recent Labs  Lab 08/02/20 2327 08/04/20 0404 08/05/20 0500 08/06/20 0922 08/07/20 0936  WBC 6.3 5.6 9.9 8.4 7.6  NEUTROABS 5.0 4.7 9.2* 7.1 6.8  HGB 9.1* 9.1* 10.2* 10.0* 10.2*  HCT 28.5* 28.3* 30.8* 29.4* 29.6*  MCV 88.8 86.0 84.2 83.1 83.1  PLT 305 300 422* 545* 045*   Basic Metabolic Panel: Recent Labs  Lab 08/04/20 1303 08/04/20 2223 08/05/20 0500 08/06/20 0922 08/07/20 0936  NA 129* 127* 131* 133*  131*  K 4.0 4.0 4.0 3.7 3.8  CL 96* 95* 98 98 97*  CO2 17* 19* 17* 17* 18*  GLUCOSE 327* 320* 262* 166* 328*  BUN 87* 93* 97* 111* 116*  CREATININE 5.62* 5.92* 6.28* 6.98* 7.17*  CALCIUM 7.8* 7.4* 7.8* 8.1* 8.1*   GFR: Estimated Creatinine Clearance: 8.9 mL/min (A) (by C-G formula based on SCr of 7.17 mg/dL (H)). Liver Function Tests: Recent Labs  Lab 08/02/20 2327 08/04/20 0404 08/05/20 0500 08/06/20 0922 08/07/20 0936  AST 85* 68* 56* 49* 50*  ALT 29 29 27 26 29   ALKPHOS 33* 33* 38 42 44  BILITOT 0.7 0.6 0.6 0.8 0.5  PROT 6.1* 5.6* 6.1* 6.0* 5.6*  ALBUMIN 2.0* 1.5* 1.8* 1.9* 1.7*   No results for input(s): LIPASE, AMYLASE in the last 168 hours. No results for input(s): AMMONIA in the last 168 hours. Coagulation Profile: No results for input(s): INR, PROTIME in the last  168 hours. Cardiac Enzymes: No results for input(s): CKTOTAL, CKMB, CKMBINDEX, TROPONINI in the last 168 hours. BNP (last 3 results) No results for input(s): PROBNP in the last 8760 hours. HbA1C: No results for input(s): HGBA1C in the last 72 hours. CBG: Recent Labs  Lab 08/06/20 1206 08/06/20 1559 08/06/20 2059 08/07/20 0813 08/07/20 1204  GLUCAP 194* 172* 200* 253* 277*   Lipid Profile: No results for input(s): CHOL, HDL, LDLCALC, TRIG, CHOLHDL, LDLDIRECT in the last 72 hours. Thyroid Function Tests: No results for input(s): TSH, T4TOTAL, FREET4, T3FREE, THYROIDAB in the last 72 hours. Anemia Panel: No results for input(s): VITAMINB12, FOLATE, FERRITIN, TIBC, IRON, RETICCTPCT in the last 72 hours. Sepsis Labs: Recent Labs  Lab 08/02/20 2327 08/03/20 0226 08/03/20 0319 08/04/20 0404 08/05/20 0500 08/06/20 0922 08/07/20 0936  PROCALCITON 0.53  --    < > 0.72 0.70 0.58 0.48  LATICACIDVEN 1.9 1.5  --   --   --   --   --    < > = values in this interval not displayed.    Recent Results (from the past 240 hour(s))  SARS Coronavirus 2 by RT PCR (hospital order, performed in [facility]  hospital lab) Nasopharyngeal Nasopharyngeal Swab     Status: Abnormal   Collection Time: 07/30/20  6:40 PM   Specimen: Nasopharyngeal Swab  Result Value Ref Range Status   SARS Coronavirus 2 POSITIVE (A) NEGATIVE Final    Comment: CRITICAL RESULT CALLED TO, READ BACK BY AND VERIFIED WITH: FRANKLIN,C.,RN @ 2153 07/30/20 TURNER,S. (NOTE) SARS-CoV-2 target nucleic acids are DETECTED  SARS-CoV-2 RNA is generally detectable in upper respiratory specimens  during the acute phase of infection.  Positive results are indicative  of the presence of the identified virus, but do not rule out bacterial infection or co-infection with other pathogens not detected by the test.  Clinical correlation with patient history and  other diagnostic information is necessary to determine patient infection status.  The expected result is negative.  Fact Sheet for Patients:   StrictlyIdeas.no   Fact Sheet for Healthcare Providers:   BankingDealers.co.za    This test is not yet approved or cleared by the [place] FDA and  has been authorized for detection and/or diagnosis of SARS-CoV-2 by FDA under an Emergency Use Authorization (EUA).  This EUA will remain in effect (m eaning this test can be used) for the duration of  the COVID-19 declaration under Section 564(b)(1) of the Act, 21 U.S.C. section 360-bbb-3(b)(1), unless the authorization is terminated or revoked sooner.  Performed at [facility], [place] [address] [place], [place] [address]   Blood Culture (routine x 2)     Status: None (Preliminary result)   Collection Time: 08/02/20 11:27 PM   Specimen: BLOOD  Result Value Ref Range Status   Specimen Description BLOOD RIGHT ARM  Final   Special Requests   Final    BOTTLES DRAWN AEROBIC ONLY Blood Culture results may not be optimal due to an inadequate volume of blood received in culture bottles   Culture   Final    NO GROWTH 3  DAYS Performed at [facility], [place] [address] [place], [place] [address]    Report Status PENDING  Incomplete  Blood Culture (routine x 2)     Status: None (Preliminary result)   Collection Time: 08/03/20  2:26 AM   Specimen: BLOOD  Result Value Ref Range Status   Specimen Description BLOOD LEFT ARM  Final   Special  Requests   Final    BOTTLES DRAWN AEROBIC AND ANAEROBIC Blood Culture adequate volume   Culture   Final    NO GROWTH 3 DAYS Performed at [facility], [place] [address], [place], [place] [address]    Report Status PENDING  Incomplete         Radiology Studies: No results found.      Scheduled Meds: . amLODipine  10 mg Oral Daily  . vitamin C  500 mg Oral Daily  . cholecalciferol  1,000 Units Oral Daily  . heparin  7,500 Units Subcutaneous Q8H  . insulin aspart  0-15 Units Subcutaneous TID WC  . insulin aspart  0-5 Units Subcutaneous QHS  . insulin detemir  7 Units Subcutaneous BID  . [START ON 08/08/2020] predniSONE  20 mg Oral Q breakfast  . sodium bicarbonate  650 mg Oral BID  . zinc sulfate  220 mg Oral Daily   Continuous Infusions:    LOS: 4 days    Time spent: 30 minutes    Barb Merino, MD [facility] Pager [phone]

## 2020-08-07 NOTE — Progress Notes (Signed)
Nephrology Follow-Up Consult note   Assessment/Recommendations: Veronica Simmons is a/an 69 y.o. female with a past medical history significant for HTN, HLD, DM 2, anemia, diastolic heart failure, CKD  G1A3 who present w/ COVID PNA c/b AKI  Worsening severe nonoliguric AKI on CKD G1 A3: Likely secondary to significant dehydration and possible ATN related to acute illness causing tubular damage and renal dysfunction.  Patient's creatinine has basically plateaued.  Creatinine was 7 yesterday and 7.2 today.  Because she looks so good clinically I think she is stable to leave the hospital as long as she can get labs next week.  Otherwise that she does not have a reliable outpatient plan she can stay in the hospital -Encourage oral hydration -Can be discharged if she can get labs early next week -Continue bicarb supplementation as below  Mild anion gap metabolic acidosis with NAGMA: Likely related to renal dysfunction.  Stop IV fluids today.  Oral bicarb 650 mg twice daily and should be discharged with this medication  Hyponatremia: Mild decrease in sodium 131 today but corrects to 134 when accounting for glucose.  Continue to monitor  Hypertension: Mild elevation in blood pressure likely related to steroids.  Continue to monitor  Covid PNA/CAP: Defer management to primary team.  Supplemental oxygen as needed  Uncontrolled Diabetes Mellitus Type 2 with Hyperglycemia: Exacerbated by steroids.  Titration of insulin per primary team   Recommendations conveyed to primary service.    [facility] 08/07/2020 11:29 AM  ___________________________________________________________  CC: AKI  Interval History/Subjective: Patient feels well today without specific complaints.  States that she feels stronger every day.  Denies significant shortness of breath   Medications:  Current Facility-Administered Medications  Medication Dose Route Frequency Provider  Last Rate Last Admin  . acetaminophen (TYLENOL) tablet 650 mg  650 mg Oral Q6H PRN Shela Leff, MD   650 mg at 08/04/20 0708  . amLODipine (NORVASC) tablet 10 mg  10 mg Oral Daily Barb Merino, MD   10 mg at 08/07/20 0829  . ascorbic acid (VITAMIN C) tablet 500 mg  500 mg Oral Daily Shela Leff, MD   500 mg at 08/07/20 0829  . chlorpheniramine-HYDROcodone (TUSSIONEX) 10-8 MG/5ML suspension 5 mL  5 mL Oral Q12H PRN Shela Leff, MD   5 mL at 08/06/20 2216  . cholecalciferol (VITAMIN D3) tablet 1,000 Units  1,000 Units Oral Daily Shela Leff, MD   1,000 Units at 08/07/20 0829  . guaiFENesin-dextromethorphan (ROBITUSSIN DM) 100-10 MG/5ML syrup 10 mL  10 mL Oral Q4H PRN Shela Leff, MD   10 mL at 08/05/20 0023  . heparin injection 7,500 Units  7,500 Units Subcutaneous Q8H Shela Leff, MD   7,500 Units at 08/07/20 [phone]  . insulin aspart (novoLOG) injection 0-15 Units  0-15 Units Subcutaneous TID WC Shela Leff, MD   8 Units at 08/07/20 0831  . insulin aspart (novoLOG) injection 0-5 Units  0-5 Units Subcutaneous QHS Shela Leff, MD   3 Units at 08/05/20 2148  . insulin detemir (LEVEMIR) injection 7 Units  7 Units Subcutaneous BID Barb Merino, MD   7 Units at 08/07/20 0831  . lactated ringers infusion   Intravenous Continuous Reesa Chew, MD 125 mL/hr at 08/07/20 0541 New Bag at 08/07/20 0541  . loperamide (IMODIUM) capsule 2 mg  2 mg Oral Q4H PRN Barb Merino, MD   2 mg at 08/04/20 1721  . methylPREDNISolone sodium succinate (SOLU-MEDROL) 125 mg/2 mL injection 50 mg  50  mg Intravenous BID Shela Leff, MD   50 mg at 08/07/20 0829  . promethazine (PHENERGAN) injection 12.5 mg  12.5 mg Intravenous Q6H PRN Barb Merino, MD   12.5 mg at 08/06/20 1220  . zinc sulfate capsule 220 mg  220 mg Oral Daily Shela Leff, MD   220 mg at 08/07/20 1121      Review of Systems: 10 systems reviewed and negative except per interval  history/subjective  Physical Exam: Vitals:   08/06/20 2004 08/07/20 0506  BP: (!) 164/78 (!) 148/71  Pulse: 69 70  Resp: 17 16  Temp: 97.8 F (36.6 C) 97.7 F (36.5 C)  SpO2: 97% 97%   No intake/output data recorded.  Intake/Output Summary (Last 24 hours) at 08/07/2020 1129 Last data filed at 08/07/2020 6244 Gross per 24 hour  Intake 2446.32 ml  Output 400 ml  Net 2046.32 ml   Constitutional: well-appearing, no acute distress ENMT: ears and nose without scars or lesions, MMM CV: normal rate, no edema Respiratory: Bilateral chest rise, no increased work of breathing Gastrointestinal: soft, non-tender, no palpable masses or hernias Skin: no visible lesions or rashes Psych: alert, judgement/insight appropriate, appropriate mood and affect   Test Results I personally reviewed new and old clinical labs and radiology tests Lab Results  Component Value Date   NA 131 (L) 08/07/2020   K 3.8 08/07/2020   CL 97 (L) 08/07/2020   CO2 18 (L) 08/07/2020   BUN 116 (H) 08/07/2020   CREATININE 7.17 (H) 08/07/2020   CALCIUM 8.1 (L) 08/07/2020   ALBUMIN 1.7 (L) 08/07/2020   PHOS 3.6 10/18/2019

## 2020-08-08 ENCOUNTER — Encounter: Payer: Self-pay | Admitting: Gastroenterology

## 2020-08-08 LAB — COMPREHENSIVE METABOLIC PANEL
ALT: 28 U/L (ref 0–44)
AST: 48 U/L — ABNORMAL HIGH (ref 15–41)
Albumin: 1.9 g/dL — ABNORMAL LOW (ref 3.5–5.0)
Alkaline Phosphatase: 42 U/L (ref 38–126)
Anion gap: 12 (ref 5–15)
BUN: 122 mg/dL — ABNORMAL HIGH (ref 8–23)
CO2: 20 mmol/L — ABNORMAL LOW (ref 22–32)
Calcium: 8.1 mg/dL — ABNORMAL LOW (ref 8.9–10.3)
Chloride: 98 mmol/L (ref 98–111)
Creatinine, Ser: 7.25 mg/dL — ABNORMAL HIGH (ref 0.44–1.00)
GFR calc Af Amer: 6 mL/min — ABNORMAL LOW (ref >60)
GFR calc non Af Amer: 5 mL/min — ABNORMAL LOW (ref >60)
Glucose, Bld: 187 mg/dL — ABNORMAL HIGH (ref 70–99)
Potassium: 3.7 mmol/L (ref 3.5–5.1)
Sodium: 130 mmol/L — ABNORMAL LOW (ref 135–145)
Total Bilirubin: 0.6 mg/dL (ref 0.3–1.2)
Total Protein: 5.7 g/dL — ABNORMAL LOW (ref 6.5–8.1)

## 2020-08-08 LAB — CBC WITH DIFFERENTIAL/PLATELET
Abs Immature Granulocytes: 0.45 10*3/uL — ABNORMAL HIGH (ref 0.00–0.07)
Basophils Absolute: 0 10*3/uL (ref 0.0–0.1)
Basophils Relative: 0 %
Eosinophils Absolute: 0 10*3/uL (ref 0.0–0.5)
Eosinophils Relative: 0 %
HCT: 31.3 % — ABNORMAL LOW (ref 36.0–46.0)
Hemoglobin: 10.4 g/dL — ABNORMAL LOW (ref 12.0–15.0)
Immature Granulocytes: 4 %
Lymphocytes Relative: 11 %
Lymphs Abs: 1.2 10*3/uL (ref 0.7–4.0)
MCH: 27.6 pg (ref 26.0–34.0)
MCHC: 33.2 g/dL (ref 30.0–36.0)
MCV: 83 fL (ref 80.0–100.0)
Monocytes Absolute: 1.1 10*3/uL — ABNORMAL HIGH (ref 0.1–1.0)
Monocytes Relative: 9 %
Neutro Abs: 8.8 10*3/uL — ABNORMAL HIGH (ref 1.7–7.7)
Neutrophils Relative %: 76 %
Platelets: 650 10*3/uL — ABNORMAL HIGH (ref 150–400)
RBC: 3.77 MIL/uL — ABNORMAL LOW (ref 3.87–5.11)
RDW: 13.9 % (ref 11.5–15.5)
WBC: 11.5 10*3/uL — ABNORMAL HIGH (ref 4.0–10.5)
nRBC: 0 % (ref 0.0–0.2)

## 2020-08-08 LAB — CULTURE, BLOOD (ROUTINE X 2)
Culture: NO GROWTH
Culture: NO GROWTH
Special Requests: ADEQUATE

## 2020-08-08 LAB — GLUCOSE, CAPILLARY
Glucose-Capillary: 122 mg/dL — ABNORMAL HIGH (ref 70–99)
Glucose-Capillary: 168 mg/dL — ABNORMAL HIGH (ref 70–99)
Glucose-Capillary: 169 mg/dL — ABNORMAL HIGH (ref 70–99)
Glucose-Capillary: 231 mg/dL — ABNORMAL HIGH (ref 70–99)

## 2020-08-08 LAB — C-REACTIVE PROTEIN: CRP: 1.9 mg/dL — ABNORMAL HIGH (ref <1.0)

## 2020-08-08 LAB — D-DIMER, QUANTITATIVE: D-Dimer, Quant: 2.19 ug/mL-FEU — ABNORMAL HIGH (ref 0.00–0.50)

## 2020-08-08 MED ORDER — HYDRALAZINE HCL 25 MG PO TABS
25.0000 mg | ORAL_TABLET | Freq: Three times a day (TID) | ORAL | Status: DC
  Administered 2020-08-08 – 2020-08-09 (×5): 25 mg via ORAL
  Filled 2020-08-08 (×5): qty 1

## 2020-08-08 NOTE — Progress Notes (Signed)
PROGRESS NOTE    Veronica Simmons  AQT:[id number] DOB: Dec 01, 1951 DOA: 08/02/2020 PCP: Katherina Mires, MD    Brief Narrative:  69 year old female with history of hypertension, hyperlipidemia, type 2 diabetes, anemia, chronic diastolic heart failure presented with shortness of breath and poor oral intake.  She was seen in the emergency room on 8/13 and tested positive for Covid.  She continued to have shortness of breath, chills and poor appetite. At the emergency room, temperature 100.5.  86% on room air.  Needed 2 L oxygen.  Creatinine 4.15.  Baseline creatinine 0.9 few days ago.  Chest x-ray showed progressive bilateral airspace disease new since 8/13.  Admitted with acute renal failure and COVID-19 pneumonia.   Assessment & Plan:   Principal Problem:   Pneumonia due to COVID-19 virus Active Problems:   Acute respiratory failure with hypoxemia (HCC)   AKI (acute kidney injury) ([place])   Hyponatremia  Pneumonia due to COVID-19 virus: Continue to monitor due to significant symptoms  chest physiotherapy, incentive spirometry, deep breathing exercises, sputum induction, mucolytic's and bronchodilators. Supplemental oxygen to keep saturations more than 90%. Covid directed therapy with , steroids Solu-Medrol, decrease dose of steroids.  Patient with much improved symptoms, will discontinue prednisone altogether.   remdesivir , finished therapy. antibiotics , not indicated. Inflammatory markers improving.  COVID-19 Labs  Recent Labs    08/06/20 0922 08/07/20 0936 08/08/20 0354  DDIMER 2.82* 2.37* 2.19*  CRP 3.8* 3.5* 1.9*    Lab Results  Component Value Date   SARSCOV2NAA POSITIVE (A) 07/30/2020   SARSCOV2NAA RESULT: NEGATIVE 07/15/2020   [place] Not Detected 10/27/2019   [place] NEGATIVE 10/17/2019   SpO2: 95 % O2 Flow Rate (L/min): 2 L/min  Acute kidney injury: Patient continues to have severe nonoliguric acute kidney injury due to significant  dehydration and ATN.  Renal ultrasound was without any hydronephrosis or obstruction.  It does show chronic kidney disease. Recent creatinine was 0.95 on 10/31.   Followed by nephrology. IV fluid discontinued.  On oral bicarb. Patient continues to have worsening renal functions, closely watching. As per nephrology. Probably plateaued. We will discharge patient home when BUN/creatinine shows some signs of improvement.  Hyponatremia: Due to above.  Continue to monitor. Improving.  Hypertension: Blood pressures fairly stable. Amlodipine resumed.  Hyperglycemia in the setting of noninsulin-dependent type 2 diabetes: Now with steroid use: On Janumet at home. Remains on insulin. Long-acting insulin added.   DVT prophylaxis: heparin injection 7,500 Units Start: 08/03/20 1400   Code Status: Full code Family Communication: Patient does not want me to call her children. She will call. Disposition Plan: Status is: Inpatient  Remains inpatient appropriate because:Persistent severe electrolyte disturbances, IV treatments appropriate due to intensity of illness or inability to take PO and Inpatient level of care appropriate due to severity of illness   Dispo: The patient is from: Home              Anticipated d/c is to: Home with home health PT OT              Anticipated d/c date is: 2 days              Patient currently is not medically stable to d/c.   Consultants:   Nephrology,   Procedures:   None  Antimicrobials:  Antibiotics Given (last 72 hours)    Date/Time Action Medication Dose Rate   08/06/20 0820 New Bag/Given   remdesivir 100 mg in sodium chloride 0.9 % 100  mL IVPB 100 mg 200 mL/hr   08/07/20 0841 New Bag/Given   remdesivir 100 mg in sodium chloride 0.9 % 100 mL IVPB 100 mg 200 mL/hr         Subjective: Patient seen and examined.  Has some nausea.  No other overnight events.  Not happy to be not able to go home.  Objective: Vitals:   08/07/20 1935 08/07/20  2013 08/07/20 2210 08/08/20 0428  BP:  (!) 152/75  (!) 169/81  Pulse: 82 77 79 78  Resp:  19 17 15   Temp:  97.8 F (36.6 C)  97.9 F (36.6 C)  TempSrc:  Oral  Oral  SpO2: 96% 94% 95% 95%  Weight:      Height:        Intake/Output Summary (Last 24 hours) at 08/08/2020 1136 Last data filed at 08/08/2020 0935 Gross per 24 hour  Intake 660 ml  Output 450 ml  Net 210 ml   Filed Weights   08/04/20 2339  Weight: 104.7 kg    Examination:  General exam: Comfortable but anxious and sick looking. Respiratory system: Clear to auscultation. Respiratory effort normal.  No added sounds. Cardiovascular system: S1 & S2 heard, RRR.  No edema. Gastrointestinal system: Abdomen is nondistended, soft and nontender. No organomegaly or masses felt. Normal bowel sounds heard.  Obese and pendulous. Central nervous system: Alert and oriented. No focal neurological deficits. Extremities: Symmetric 5 x 5 power. Skin: No rashes, lesions or ulcers Psychiatry: Judgement and insight appear normal. Mood & affect normal.    Data Reviewed: I have personally reviewed following labs and imaging studies  CBC: Recent Labs  Lab 08/04/20 0404 08/05/20 0500 08/06/20 0922 08/07/20 0936 08/08/20 0827  WBC 5.6 9.9 8.4 7.6 11.5*  NEUTROABS 4.7 9.2* 7.1 6.8 8.8*  HGB 9.1* 10.2* 10.0* 10.2* 10.4*  HCT 28.3* 30.8* 29.4* 29.6* 31.3*  MCV 86.0 84.2 83.1 83.1 83.0  PLT 300 422* 545* 566* 716*   Basic Metabolic Panel: Recent Labs  Lab 08/04/20 2223 08/05/20 0500 08/06/20 0922 08/07/20 0936 08/08/20 0354  NA 127* 131* 133* 131* 130*  K 4.0 4.0 3.7 3.8 3.7  CL 95* 98 98 97* 98  CO2 19* 17* 17* 18* 20*  GLUCOSE 320* 262* 166* 328* 187*  BUN 93* 97* 111* 116* 122*  CREATININE 5.92* 6.28* 6.98* 7.17* 7.25*  CALCIUM 7.4* 7.8* 8.1* 8.1* 8.1*   GFR: Estimated Creatinine Clearance: 8.8 mL/min (A) (by C-G formula based on SCr of 7.25 mg/dL (H)). Liver Function Tests: Recent Labs  Lab 08/04/20 0404  08/05/20 0500 08/06/20 0922 08/07/20 0936 08/08/20 0354  AST 68* 56* 49* 50* 48*  ALT 29 27 26 29 28   ALKPHOS 33* 38 42 44 42  BILITOT 0.6 0.6 0.8 0.5 0.6  PROT 5.6* 6.1* 6.0* 5.6* 5.7*  ALBUMIN 1.5* 1.8* 1.9* 1.7* 1.9*   No results for input(s): LIPASE, AMYLASE in the last 168 hours. No results for input(s): AMMONIA in the last 168 hours. Coagulation Profile: No results for input(s): INR, PROTIME in the last 168 hours. Cardiac Enzymes: No results for input(s): CKTOTAL, CKMB, CKMBINDEX, TROPONINI in the last 168 hours. BNP (last 3 results) No results for input(s): PROBNP in the last 8760 hours. HbA1C: No results for input(s): HGBA1C in the last 72 hours. CBG: Recent Labs  Lab 08/07/20 0813 08/07/20 1204 08/07/20 1757 08/07/20 2104 08/08/20 0744  GLUCAP 253* 277* 345* 298* 122*   Lipid Profile: No results for input(s): CHOL, HDL, LDLCALC, TRIG,  CHOLHDL, LDLDIRECT in the last 72 hours. Thyroid Function Tests: No results for input(s): TSH, T4TOTAL, FREET4, T3FREE, THYROIDAB in the last 72 hours. Anemia Panel: No results for input(s): VITAMINB12, FOLATE, FERRITIN, TIBC, IRON, RETICCTPCT in the last 72 hours. Sepsis Labs: Recent Labs  Lab 08/02/20 2327 08/03/20 0226 08/03/20 0319 08/04/20 0404 08/05/20 0500 08/06/20 0922 08/07/20 0936  PROCALCITON 0.53  --    < > 0.72 0.70 0.58 0.48  LATICACIDVEN 1.9 1.5  --   --   --   --   --    < > = values in this interval not displayed.    Recent Results (from the past 240 hour(s))  SARS Coronavirus 2 by RT PCR (hospital order, performed in [facility] hospital lab) Nasopharyngeal Nasopharyngeal Swab     Status: Abnormal   Collection Time: 07/30/20  6:40 PM   Specimen: Nasopharyngeal Swab  Result Value Ref Range Status   SARS Coronavirus 2 POSITIVE (A) NEGATIVE Final    Comment: CRITICAL RESULT CALLED TO, READ BACK BY AND VERIFIED WITH: FRANKLIN,C.,RN @ 2153 07/30/20 TURNER,S. (NOTE) SARS-CoV-2 target nucleic acids are  DETECTED  SARS-CoV-2 RNA is generally detectable in upper respiratory specimens  during the acute phase of infection.  Positive results are indicative  of the presence of the identified virus, but do not rule out bacterial infection or co-infection with other pathogens not detected by the test.  Clinical correlation with patient history and  other diagnostic information is necessary to determine patient infection status.  The expected result is negative.  Fact Sheet for Patients:   StrictlyIdeas.no   Fact Sheet for Healthcare Providers:   BankingDealers.co.za    This test is not yet approved or cleared by the [place] FDA and  has been authorized for detection and/or diagnosis of SARS-CoV-2 by FDA under an Emergency Use Authorization (EUA).  This EUA will remain in effect (m eaning this test can be used) for the duration of  the COVID-19 declaration under Section 564(b)(1) of the Act, 21 U.S.C. section 360-bbb-3(b)(1), unless the authorization is terminated or revoked sooner.  Performed at [facility], [place] [address] [place], [place] [address]   Blood Culture (routine x 2)     Status: None   Collection Time: 08/02/20 11:27 PM   Specimen: BLOOD  Result Value Ref Range Status   Specimen Description BLOOD RIGHT ARM  Final   Special Requests   Final    BOTTLES DRAWN AEROBIC ONLY Blood Culture results may not be optimal due to an inadequate volume of blood received in culture bottles   Culture   Final    NO GROWTH 5 DAYS Performed at [facility], [place] [address] [place], [place] [address]    Report Status 08/08/2020 FINAL  Final  Blood Culture (routine x 2)     Status: None   Collection Time: 08/03/20  2:26 AM   Specimen: BLOOD  Result Value Ref Range Status   Specimen Description BLOOD LEFT ARM  Final   Special Requests   Final    BOTTLES DRAWN AEROBIC AND ANAEROBIC Blood Culture adequate volume    Culture   Final    NO GROWTH 5 DAYS Performed at [facility], [place] [address] [place],  [address]    Report Status 08/08/2020 FINAL  Final         Radiology Studies: No results found.      Scheduled Meds: . amLODipine  10 mg Oral Daily  .  vitamin C  500 mg Oral Daily  . cholecalciferol  1,000 Units Oral Daily  . heparin  7,500 Units Subcutaneous Q8H  . hydrALAZINE  25 mg Oral Q8H  . insulin aspart  0-15 Units Subcutaneous TID WC  . insulin aspart  0-5 Units Subcutaneous QHS  . insulin detemir  7 Units Subcutaneous BID  . sodium bicarbonate  650 mg Oral BID  . zinc sulfate  220 mg Oral Daily   Continuous Infusions:    LOS: 5 days    Time spent: 30 minutes    Barb Merino, MD [facility] Pager [phone]

## 2020-08-08 NOTE — Progress Notes (Signed)
Patient able to walk in the room using walker.

## 2020-08-08 NOTE — Progress Notes (Signed)
Nephrology Follow-Up Consult note   Assessment/Recommendations: Veronica Simmons is a/an 69 y.o. female with a past medical history significant for HTN, HLD, DM 2, anemia, diastolic heart failure, CKD  G1A3 who present w/ COVID PNA c/b AKI  Worsening severe nonoliguric AKI on CKD G1 A3: Likely secondary to significant dehydration and possible ATN related to acute illness causing tubular damage and renal dysfunction.  Patient's creatinine has basically plateaued.  Now waiting for improvement. -Encourage oral hydration -Monitor kidney function daily -Continue bicarb supplementation as below  NAGMA: Related to kidney injury.  On oral bicarbonate.  Continue to monitor  Hyponatremia: Mild decrease in sodium corrects to about 132 when accounting for glucose.  Likely related to free water retention in the setting of kidney disease.  Continue to monitor  Hypertension: Mild elevation in blood pressure likely related to steroids and decreased renal function.  Start hydralazine 25 mg 3 times daily.  Continue to monitor  Covid PNA/CAP: Defer management to primary team.    Doing well today.  Stopping steroids.  No oxygen needed  Uncontrolled Diabetes Mellitus Type 2 with Hyperglycemia: Exacerbated by steroids.  Titration of insulin per primary team   Recommendations conveyed to primary service.    [facility] 08/08/2020 11:32 AM  ___________________________________________________________  CC: AKI  Interval History/Subjective: Patient feels well.  Denies shortness of breath.  Urine output adequate.  Frustrated that she can go home   Medications:  Current Facility-Administered Medications  Medication Dose Route Frequency Provider Last Rate Last Admin   acetaminophen (TYLENOL) tablet 650 mg  650 mg Oral Q6H PRN Shela Leff, MD   650 mg at 08/04/20 0708   amLODipine (NORVASC) tablet 10 mg  10 mg Oral Daily Barb Merino, MD   10 mg at  08/08/20 9562   ascorbic acid (VITAMIN C) tablet 500 mg  500 mg Oral Daily Shela Leff, MD   500 mg at 08/08/20 1308   chlorpheniramine-HYDROcodone (TUSSIONEX) 10-8 MG/5ML suspension 5 mL  5 mL Oral Q12H PRN Shela Leff, MD   5 mL at 08/06/20 2216   cholecalciferol (VITAMIN D3) tablet 1,000 Units  1,000 Units Oral Daily Shela Leff, MD   1,000 Units at 08/08/20 0933   guaiFENesin-dextromethorphan (ROBITUSSIN DM) 100-10 MG/5ML syrup 10 mL  10 mL Oral Q4H PRN Shela Leff, MD   10 mL at 08/07/20 2216   heparin injection 7,500 Units  7,500 Units Subcutaneous Q8H Shela Leff, MD   7,500 Units at 08/08/20 6578   hydrALAZINE (APRESOLINE) tablet 25 mg  25 mg Oral Q8H Reesa Chew, MD   25 mg at 08/08/20 0813   insulin aspart (novoLOG) injection 0-15 Units  0-15 Units Subcutaneous TID WC Shela Leff, MD   2 Units at 08/08/20 4696   insulin aspart (novoLOG) injection 0-5 Units  0-5 Units Subcutaneous QHS Shela Leff, MD   3 Units at 08/07/20 2229   insulin detemir (LEVEMIR) injection 7 Units  7 Units Subcutaneous BID Barb Merino, MD   7 Units at 08/08/20 0934   loperamide (IMODIUM) capsule 2 mg  2 mg Oral Q4H PRN Barb Merino, MD   2 mg at 08/04/20 1721   predniSONE (DELTASONE) tablet 20 mg  20 mg Oral Q breakfast Barb Merino, MD   20 mg at 08/08/20 2952   promethazine (PHENERGAN) injection 12.5 mg  12.5 mg Intravenous Q6H PRN Barb Merino, MD   12.5 mg at 08/06/20 1220   sodium bicarbonate tablet 650 mg  650  mg Oral BID Reesa Chew, MD   650 mg at 08/08/20 2836   zinc sulfate capsule 220 mg  220 mg Oral Daily Shela Leff, MD   220 mg at 08/08/20 6294      Review of Systems: 10 systems reviewed and negative except per interval history/subjective  Physical Exam: Vitals:   08/07/20 2210 08/08/20 0428  BP:  (!) 169/81  Pulse: 79 78  Resp: 17 15  Temp:  97.9 F (36.6 C)  SpO2: 95% 95%   Total I/O In: 180  [P.O.:180] Out: -   Intake/Output Summary (Last 24 hours) at 08/08/2020 1132 Last data filed at 08/08/2020 0935 Gross per 24 hour  Intake 660 ml  Output 450 ml  Net 210 ml   Constitutional: well-appearing, no acute distress ENMT: ears and nose without scars or lesions, MMM CV: normal rate, no edema Respiratory: Bilateral chest rise, no increased work of breathing Gastrointestinal: soft, non-tender, no palpable masses or hernias Skin: no visible lesions or rashes Psych: alert, judgement/insight appropriate, appropriate mood and affect   Test Results I personally reviewed new and old clinical labs and radiology tests Lab Results  Component Value Date   NA 130 (L) 08/08/2020   K 3.7 08/08/2020   CL 98 08/08/2020   CO2 20 (L) 08/08/2020   BUN 122 (H) 08/08/2020   CREATININE 7.25 (H) 08/08/2020   CALCIUM 8.1 (L) 08/08/2020   ALBUMIN 1.9 (L) 08/08/2020   PHOS 3.6 10/18/2019

## 2020-08-09 LAB — BASIC METABOLIC PANEL
Anion gap: 17 — ABNORMAL HIGH (ref 5–15)
BUN: 126 mg/dL — ABNORMAL HIGH (ref 8–23)
CO2: 16 mmol/L — ABNORMAL LOW (ref 22–32)
Calcium: 8.2 mg/dL — ABNORMAL LOW (ref 8.9–10.3)
Chloride: 102 mmol/L (ref 98–111)
Creatinine, Ser: 7.29 mg/dL — ABNORMAL HIGH (ref 0.44–1.00)
GFR calc Af Amer: 6 mL/min — ABNORMAL LOW (ref >60)
GFR calc non Af Amer: 5 mL/min — ABNORMAL LOW (ref >60)
Glucose, Bld: 82 mg/dL (ref 70–99)
Potassium: 3.6 mmol/L (ref 3.5–5.1)
Sodium: 135 mmol/L (ref 135–145)

## 2020-08-09 LAB — GLUCOSE, CAPILLARY
Glucose-Capillary: 84 mg/dL (ref 70–99)
Glucose-Capillary: 140 mg/dL — ABNORMAL HIGH (ref 70–99)

## 2020-08-09 MED ORDER — INSULIN DETEMIR 100 UNIT/ML ~~LOC~~ SOLN
4.0000 [IU] | Freq: Two times a day (BID) | SUBCUTANEOUS | Status: DC
  Filled 2020-08-09: qty 0.04

## 2020-08-09 MED ORDER — INSULIN LISPRO (1 UNIT DIAL) 100 UNIT/ML (KWIKPEN): 1.0000 [IU] | PEN_INJECTOR | Freq: Three times a day (TID) | SUBCUTANEOUS | 11 refills | Status: AC

## 2020-08-09 MED ORDER — SODIUM BICARBONATE 650 MG PO TABS: 1300.0000 mg | ORAL_TABLET | Freq: Two times a day (BID) | ORAL | 0 refills | Status: AC

## 2020-08-09 MED ORDER — HYDRALAZINE HCL 25 MG PO TABS: 25.0000 mg | ORAL_TABLET | Freq: Three times a day (TID) | ORAL | 0 refills | Status: DC

## 2020-08-09 MED ORDER — SODIUM BICARBONATE 650 MG PO TABS: 1300.0000 mg | ORAL_TABLET | Freq: Two times a day (BID) | ORAL | Status: DC

## 2020-08-09 NOTE — Discharge Summary (Signed)
Physician Discharge Summary  Rudene Poulsen LPF:[id number] DOB: [birth year] DOA: 08/02/2020  PCP: Katherina Mires, MD  Admit date: 08/02/2020 Discharge date: 08/09/2020  Admitted From: Home. Disposition: Home with home health  Recommendations for Outpatient Follow-up:  1. Follow up with PCP in 1-2 weeks 2. Repeat renal function test as a scheduled by nephrology. 3. Follow-up with nephrology, office to schedule follow-up.  Home Health: PT/OT Equipment/Devices: None  Discharge Condition: Stable CODE STATUS: Full code Diet recommendation: Low-salt and low-carb diet  Discharge summary: 69 year old female with history of hypertension, hyperlipidemia, type 2 diabetes, anemia, chronic diastolic heart failure presented with shortness of breath and poor oral intake.  She was seen in the emergency room on 8/13 and tested positive for Covid.  She continued to have shortness of breath, chills and poor appetite. At the emergency room, temperature 100.5.  86% on room air.  Needed 2 L oxygen.  Creatinine 4.15.  Baseline creatinine 0.9 few days ago.  Chest x-ray showed progressive bilateral airspace disease new since 8/13.  Admitted with acute renal failure and COVID-19 pneumonia.  She was admitted to hospital with following two major medical issues:  -Pneumonia due to COVID-19 virus: Treated aggressively with respiratory therapy, steroids and remdesivir.  Finished therapy.  Respiratory symptoms improved.  Appetite improved.  Discontinue steroids on discharge.  Isolation precautions for 3 weeks since initial diagnosis.  Vaccination information and recommendations given.  -Patient developed nonoliguric renal failure with severe disease probably with poor appetite and also suspect ATN from COVID-19 virus infection.  She has adequate urine output.  She was followed by nephrology in the hospital.  Potassium is normal.  Her renal functions has probably platued.  As per nephrology plan discharging her  home with very close outpatient follow-up. -She will have close renal function monitoring with lab testing at [facility].  Bicarbonate 1300 mg twice a day. -She will discontinue losartan, diuretics, blood sugar medications including Januvia, Metformin.  This needs to be readdressed once her kidney functions normalized.  Hypertension: Continue amlodipine, carvedilol, added hydralazine.  Holding diuretics and losartan.  Type 2 diabetes: On Janumet at home.  Discontinue due to renal functions.  She has significant abnormal renal functions, will keep on low-dose short acting insulin until her renal functions improved then she can go back to oral hypoglycemics preferably sulfonylureas.  Patient has become weak and deconditioned with hospitalization and active medical issues.  She is going to benefit with home health PT OT.  She has enough support at home.  Going home with very close outpatient follow-up.  Discharge Diagnoses:  Principal Problem:   Pneumonia due to COVID-19 virus Active Problems:   Acute respiratory failure with hypoxemia ([place])   AKI (acute kidney injury) ([place])   Hyponatremia    Discharge Instructions  Discharge Instructions    Call MD for:  difficulty breathing, headache or visual disturbances   Complete by: As directed    Call MD for:  persistant dizziness or light-headedness   Complete by: As directed    Call MD for:  persistant nausea and vomiting   Complete by: As directed    Diet Carb Modified   Complete by: As directed    Discharge instructions   Complete by: As directed    Do not use your blood sugar medications until your kidney test normalized. Use insulin at home as needed according to the scale and instructions. Follow-up with blood test and follow-up with your nephrologist.   Increase activity slowly   Complete  by: As directed      Allergies as of 08/09/2020      Reactions   Levofloxacin Hives      Medication List    STOP taking these  medications   azithromycin 250 MG tablet Commonly known as: Zithromax Z-Pak   beta carotene 15 MG capsule   furosemide 20 MG tablet Commonly known as: LASIX   Janumet 50-1000 MG tablet Generic drug: sitaGLIPtin-metformin   Januvia 100 MG tablet Generic drug: sitaGLIPtin   lisinopril 40 MG tablet Commonly known as: ZESTRIL   losartan 100 MG tablet Commonly known as: COZAAR   Magnesium 250 MG Tabs   metFORMIN 500 MG 24 hr tablet Commonly known as: GLUCOPHAGE-XR   vitamin C 500 MG tablet Commonly known as: ASCORBIC ACID   vitamin E 1000 UNIT capsule   Vitamins/Minerals Tabs     TAKE these medications   amLODipine 10 MG tablet Commonly known as: NORVASC Take 1 tablet (10 mg total) by mouth daily.   carvedilol 25 MG tablet Commonly known as: COREG Take 25 mg by mouth 2 (two) times daily.   COQ10 PO Take 1 tablet by mouth daily.   ferrous sulfate 325 (65 FE) MG tablet Take 1 tablet by mouth daily.   glucose blood test strip Use as instructed   glucose blood test strip Commonly known as: Accu-Chek Aviva Plus Use as instructed   glucose blood test strip Commonly known as: Accu-Chek SmartView Test blood sugar once daily   hydrALAZINE 25 MG tablet Commonly known as: APRESOLINE Take 1 tablet (25 mg total) by mouth every 8 (eight) hours.   insulin lispro 100 UNIT/ML KwikPen Commonly known as: HUMALOG Inject 0.01-0.09 mLs (1-9 Units total) into the skin 3 (three) times daily before meals. Blood sugars 70-120: no Insulin 121-150: 2 units 151-200: 3 units 201-250: 4 units 251-300: 5 units 301-350: 6 units 351-400: 9 units More than 400 use 9 units and call your doctor   onetouch ultrasoft lancets Use as instructed   sodium bicarbonate 650 MG tablet Take 2 tablets (1,300 mg total) by mouth 2 (two) times daily.   traZODone 100 MG tablet Commonly known as: DESYREL Take 200 mg by mouth at bedtime as needed for sleep.       Contact information for  after-discharge care    Destination    [facility] .   Service: Skilled Nursing Contact information: [address] [place] [phone]                 Allergies  Allergen Reactions  . Levofloxacin Hives    Consultations:  Nephrology   Procedures/Studies: US RENAL  Result Date: 08/03/2020 CLINICAL DATA:  Acute kidney injury, history of hypertension and diabetes EXAM: RENAL / URINARY TRACT ULTRASOUND COMPLETE COMPARISON:  CT abdomen pelvis 09/20/2016, ultrasound 08/20/2009 FINDINGS: Right Kidney: Renal measurements: 13.4 x 5.2 x 6.1 cm = volume: 220.1 mL. Increased renal cortical echogenicity. No concerning renal mass, shadowing calculus or hydronephrosis. Left Kidney: Renal measurements: 12.5 x 6.5 x 7 cm = volume: 298.2 mL. Increased renal cortical echogenicity. No concerning renal mass, shadowing calculus or hydronephrosis. Bladder: Appears normal for degree of bladder distention. Other: None. IMPRESSION: 1. Increased renal cortical echogenicity compatible with medical renal disease. 2. Otherwise unremarkable renal ultrasound. Electronically Signed   By: Lovena Le M.D.   On: 08/03/2020 03:41   DG Chest Port 1 View  Result Date: 08/03/2020 CLINICAL DATA:  Shortness of breath.  COVID  positive 3 days ago. EXAM: PORTABLE CHEST 1 VIEW COMPARISON:  Radiograph 07/30/2020 FINDINGS: New patchy opacities in both mid and lower lung zones from prior exam. The right upper lobe opacity is persistent, slightly increased. Borderline cardiomegaly is unchanged. Unchanged mediastinal contours. Lower lung volumes from prior. No pneumothorax or large pleural effusion. No acute osseous abnormalities are seen. IMPRESSION: Progression in bilateral airspace disease over the past 3 days with new opacities in both mid lower lung zones. Pattern typical of COVID-19 pneumonia. Electronically Signed   By: Keith Rake M.D.   On: 08/03/2020 00:00   DG  Chest Portable 1 View  Result Date: 07/30/2020 CLINICAL DATA:  Chills and cough EXAM: PORTABLE CHEST 1 VIEW COMPARISON:  October 16, 2019 FINDINGS: The heart size and mediastinal contours are borderline enlarged. Patchy airspace opacity is seen within the right upper lung. The left lung is clear. No pleural effusion. Aortic knob calcifications are seen. The visualized skeletal structures are unremarkable. IMPRESSION: Hazy airspace opacity in the right upper lung which may be due to atelectasis and/or infectious etiology. Electronically Signed   By: Prudencio Pair M.D.   On: 07/30/2020 19:02   (Echo, Carotid, EGD, Colonoscopy, ERCP)    Subjective: Patient seen and examined.  Eager to go home.  Denies any complaints.  She can do some insulin at home.  Have good appetite and drinking plenty of liquids.   Discharge Exam: Vitals:   08/09/20 1455 08/09/20 1522  BP: (!) 151/68 (!) 155/63  Pulse: 84 90  Resp: 14 14  Temp: 97.8 F (36.6 C) (!) 97.3 F (36.3 C)  SpO2: 96% 95%   Vitals:   08/09/20 0416 08/09/20 0800 08/09/20 1455 08/09/20 1522  BP: (!) 154/79 (!) 131/49 (!) 151/68 (!) 155/63  Pulse: 70 77 84 90  Resp: 16 12 14 14   Temp: 98.1 F (36.7 C) 98 F (36.7 C) 97.8 F (36.6 C) (!) 97.3 F (36.3 C)  TempSrc: Oral Oral Oral Oral  SpO2: 94% 92% 96% 95%  Weight:      Height:        General: Pt is alert, awake, not in acute distress Cardiovascular: RRR, S1/S2 +, no rubs, no gallops Respiratory: CTA bilaterally, no wheezing, no rhonchi Abdominal: Soft, NT, ND, bowel sounds +, obese and pendulous. Extremities: no edema, no cyanosis, mostly nonpitting edema.    The results of significant diagnostics from this hospitalization (including imaging, microbiology, ancillary and laboratory) are listed below for reference.     Microbiology: Recent Results (from the past 240 hour(s))  SARS Coronavirus 2 by RT PCR (hospital order, performed in [facility] hospital lab) Nasopharyngeal  Nasopharyngeal Swab     Status: Abnormal   Collection Time: 07/30/20  6:40 PM   Specimen: Nasopharyngeal Swab  Result Value Ref Range Status   SARS Coronavirus 2 POSITIVE (A) NEGATIVE Final    Comment: CRITICAL RESULT CALLED TO, READ BACK BY AND VERIFIED WITH: FRANKLIN,C.,RN @ 2153 07/30/20 TURNER,S. (NOTE) SARS-CoV-2 target nucleic acids are DETECTED  SARS-CoV-2 RNA is generally detectable in upper respiratory specimens  during the acute phase of infection.  Positive results are indicative  of the presence of the identified virus, but do not rule out bacterial infection or co-infection with other pathogens not detected by the test.  Clinical correlation with patient history and  other diagnostic information is necessary to determine patient infection status.  The expected result is negative.  Fact Sheet for Patients:   StrictlyIdeas.no   Fact Sheet for Healthcare  Providers:   BankingDealers.co.za    This test is not yet approved or cleared by the [place] and  has been authorized for detection and/or diagnosis of SARS-CoV-2 by FDA under an Emergency Use Authorization (EUA).  This EUA will remain in effect (m eaning this test can be used) for the duration of  the COVID-19 declaration under Section 564(b)(1) of the Act, 21 U.S.C. section 360-bbb-3(b)(1), unless the authorization is terminated or revoked sooner.  Performed at [facility], [place] [address] [place], [place] [address]   Blood Culture (routine x 2)     Status: None   Collection Time: 08/02/20 11:27 PM   Specimen: BLOOD  Result Value Ref Range Status   Specimen Description BLOOD RIGHT ARM  Final   Special Requests   Final    BOTTLES DRAWN AEROBIC ONLY Blood Culture results may not be optimal due to an inadequate volume of blood received in culture bottles   Culture   Final    NO GROWTH 5 DAYS Performed at [facility], [place] [address], [place], [place] [address]    Report Status 08/08/2020 FINAL  Final  Blood Culture (routine x 2)     Status: None   Collection Time: 08/03/20  2:26 AM   Specimen: BLOOD  Result Value Ref Range Status   Specimen Description BLOOD LEFT ARM  Final   Special Requests   Final    BOTTLES DRAWN AEROBIC AND ANAEROBIC Blood Culture adequate volume   Culture   Final    NO GROWTH 5 DAYS Performed at [facility], [place] [address], [place], [place] [address]    Report Status 08/08/2020 FINAL  Final     Labs: BNP (last 3 results) Recent Labs    10/17/19 1627  BNP 696.2*   Basic Metabolic Panel: Recent Labs  Lab 08/05/20 0500 08/06/20 0922 08/07/20 0936 08/08/20 0354 08/09/20 0754  NA 131* 133* 131* 130* 135  K 4.0 3.7 3.8 3.7 3.6  CL 98 98 97* 98 102  CO2 17* 17* 18* 20* 16*  GLUCOSE 262* 166* 328* 187* 82  BUN 97* 111* 116* 122* 126*  CREATININE 6.28* 6.98* 7.17* 7.25* 7.29*  CALCIUM 7.8* 8.1* 8.1* 8.1* 8.2*   Liver Function Tests: Recent Labs  Lab 08/04/20 0404 08/05/20 0500 08/06/20 0922 08/07/20 0936 08/08/20 0354  AST 68* 56* 49* 50* 48*  ALT 29 27 26 29 28   ALKPHOS 33* 38 42 44 42  BILITOT 0.6 0.6 0.8 0.5 0.6  PROT 5.6* 6.1* 6.0* 5.6* 5.7*  ALBUMIN 1.5* 1.8* 1.9* 1.7* 1.9*   No results for input(s): LIPASE, AMYLASE in the last 168 hours. No results for input(s): AMMONIA in the last 168 hours. CBC: Recent Labs  Lab 08/04/20 0404 08/05/20 0500 08/06/20 0922 08/07/20 0936 08/08/20 0827  WBC 5.6 9.9 8.4 7.6 11.5*  NEUTROABS 4.7 9.2* 7.1 6.8 8.8*  HGB 9.1* 10.2* 10.0* 10.2* 10.4*  HCT 28.3* 30.8* 29.4* 29.6* 31.3*  MCV 86.0 84.2 83.1 83.1 83.0  PLT 300 422* 545* 566* 650*   Cardiac Enzymes: No results for input(s): CKTOTAL, CKMB, CKMBINDEX, TROPONINI in the last 168 hours. BNP: Invalid input(s): POCBNP CBG: Recent Labs  Lab 08/08/20 1238 08/08/20 1643 08/08/20 2105 08/09/20 0717 08/09/20 1215  GLUCAP 168* 169* 231* 84 140*    D-Dimer Recent Labs    08/07/20 0936 08/08/20 0354  DDIMER 2.37* 2.19*   Hgb A1c No results for input(s): HGBA1C in the last  72 hours. Lipid Profile No results for input(s): CHOL, HDL, LDLCALC, TRIG, CHOLHDL, LDLDIRECT in the last 72 hours. Thyroid function studies No results for input(s): TSH, T4TOTAL, T3FREE, THYROIDAB in the last 72 hours.  Invalid input(s): FREET3 Anemia work up No results for input(s): VITAMINB12, FOLATE, FERRITIN, TIBC, IRON, RETICCTPCT in the last 72 hours. Urinalysis    Component Value Date/Time   COLORURINE YELLOW 08/03/2020 0830   APPEARANCEUR CLOUDY (A) 08/03/2020 0830   LABSPEC 1.022 08/03/2020 0830   PHURINE 5.0 08/03/2020 0830   GLUCOSEU >=500 (A) 08/03/2020 0830   HGBUR MODERATE (A) 08/03/2020 0830   BILIRUBINUR NEGATIVE 08/03/2020 0830   BILIRUBINUR negative 07/17/2013 0946   KETONESUR NEGATIVE 08/03/2020 0830   PROTEINUR >=300 (A) 08/03/2020 0830   UROBILINOGEN 1.0 07/17/2013 0946   UROBILINOGEN 1.0 05/27/2012 2055   NITRITE NEGATIVE 08/03/2020 0830   LEUKOCYTESUR NEGATIVE 08/03/2020 0830   Sepsis Labs Invalid input(s): PROCALCITONIN,  WBC,  LACTICIDVEN Microbiology Recent Results (from the past 240 hour(s))  SARS Coronavirus 2 by RT PCR (hospital order, performed in [place] hospital lab) Nasopharyngeal Nasopharyngeal Swab     Status: Abnormal   Collection Time: 07/30/20  6:40 PM   Specimen: Nasopharyngeal Swab  Result Value Ref Range Status   SARS Coronavirus 2 POSITIVE (A) NEGATIVE Final    Comment: CRITICAL RESULT CALLED TO, READ BACK BY AND VERIFIED WITH: FRANKLIN,C.,RN @ 2153 07/30/20 TURNER,S. (NOTE) SARS-CoV-2 target nucleic acids are DETECTED  SARS-CoV-2 RNA is generally detectable in upper respiratory specimens  during the acute phase of infection.  Positive results are indicative  of the presence of the identified virus, but do not rule out bacterial infection or co-infection with other pathogens not detected by  the test.  Clinical correlation with patient history and  other diagnostic information is necessary to determine patient infection status.  The expected result is negative.  Fact Sheet for Patients:   StrictlyIdeas.no   Fact Sheet for Healthcare Providers:   BankingDealers.co.za    This test is not yet approved or cleared by the [place] FDA and  has been authorized for detection and/or diagnosis of SARS-CoV-2 by FDA under an Emergency Use Authorization (EUA).  This EUA will remain in effect (m eaning this test can be used) for the duration of  the COVID-19 declaration under Section 564(b)(1) of the Act, 21 U.S.C. section 360-bbb-3(b)(1), unless the authorization is terminated or revoked sooner.  Performed at [facility], [place] [address] [place], [place] [address]   Blood Culture (routine x 2)     Status: None   Collection Time: 08/02/20 11:27 PM   Specimen: BLOOD  Result Value Ref Range Status   Specimen Description BLOOD RIGHT ARM  Final   Special Requests   Final    BOTTLES DRAWN AEROBIC ONLY Blood Culture results may not be optimal due to an inadequate volume of blood received in culture bottles   Culture   Final    NO GROWTH 5 DAYS Performed at [facility], [place] [address] [place], [place] [address]    Report Status 08/08/2020 FINAL  Final  Blood Culture (routine x 2)     Status: None   Collection Time: 08/03/20  2:26 AM   Specimen: BLOOD  Result Value Ref Range Status   Specimen Description BLOOD LEFT ARM  Final   Special Requests   Final    BOTTLES DRAWN AEROBIC AND ANAEROBIC Blood Culture adequate volume   Culture   Final  NO GROWTH 5 DAYS Performed at [facility], [place] [address], [place], [place] [address]    Report Status 08/08/2020 FINAL  Final     Time coordinating discharge:  40 minutes  SIGNED:   Barb Merino, MD  [facility] 08/09/2020, 5:10 PM

## 2020-08-09 NOTE — Plan of Care (Signed)
  Problem: Education: Goal: Knowledge of risk factors and measures for prevention of condition will improve Outcome: Progressing   Problem: Coping: Goal: Psychosocial and spiritual needs will be supported Outcome: Progressing   Problem: Education: Goal: Knowledge of General Education information will improve Description: Including pain rating scale, medication(s)/side effects and non-pharmacologic comfort measures Outcome: Progressing   Problem: Health Behavior/Discharge Planning: Goal: Ability to manage health-related needs will improve Outcome: Progressing   Problem: Clinical Measurements: Goal: Ability to maintain clinical measurements within normal limits will improve Outcome: Progressing Goal: Will remain free from infection Outcome: Progressing Goal: Diagnostic test results will improve Outcome: Progressing   Problem: Activity: Goal: Risk for activity intolerance will decrease Outcome: Progressing   Problem: Coping: Goal: Level of anxiety will decrease Outcome: Progressing   Problem: Elimination: Goal: Will not experience complications related to bowel motility Outcome: Progressing Goal: Will not experience complications related to urinary retention Outcome: Progressing   Problem: Education: Goal: Knowledge of disease and its progression will improve Outcome: Progressing   Problem: Health Behavior/Discharge Planning: Goal: Ability to manage health-related needs will improve Outcome: Progressing   Problem: Clinical Measurements: Goal: Complications related to the disease process or treatment will be avoided or minimized Outcome: Progressing   Problem: Activity: Goal: Activity intolerance will improve Outcome: Progressing   Problem: Fluid Volume: Goal: Fluid volume balance will be maintained or improved Outcome: Progressing   Problem: Nutritional: Goal: Ability to make appropriate dietary choices will improve Outcome: Progressing   Problem: Urinary  Elimination: Goal: Progression of disease will be identified and treated Outcome: Progressing

## 2020-08-09 NOTE — Care Management Important Message (Signed)
Important Message  Patient Details  Name: Veronica Simmons MRN: [id number] Date of Birth: 1951/09/03   Medicare Important Message Given:  Yes - Important Message mailed due to current National Emergency  Verbal consent obtained due to current National Emergency  Relationship to patient: Self Contact Name: Breezy Hertenstein Call Date: 08/09/20  Time: 1602 Phone: [phone] Outcome: No Answer/Busy Important Message mailed to: Patient address on file    [name] 08/09/2020, 4:02 PM

## 2020-08-09 NOTE — Progress Notes (Signed)
Veronica Simmons to be discharged Home per MD order. Discussed prescriptions and follow up appointments with the patient. Prescriptions given to patient; medication list explained in detail. Patient verbalized understanding.   Skin clean, dry and intact without evidence of skin break down, no evidence of skin tears noted. IV catheter discontinued intact. Site without signs and symptoms of complications. Dressing and pressure applied. Pt denies pain at the site currently. No complaints noted.  Patient free of lines, drains, and wounds.  An After Visit Summary (AVS) was printed and given to the patient. Patient escorted via wheelchair, and discharged home via private auto.  Cathlean Marseilles Tamala Julian, MSN, RN, [place], AGCNS

## 2020-08-09 NOTE — Progress Notes (Signed)
PROGRESS NOTE    Veronica Simmons  WUJ:[id number] DOB: 1951-11-21 DOA: 08/02/2020 PCP: Katherina Mires, MD    Brief Narrative:  69 year old female with history of hypertension, hyperlipidemia, type 2 diabetes, anemia, chronic diastolic heart failure presented with shortness of breath and poor oral intake.  She was seen in the emergency room on 8/13 and tested positive for Covid.  She continued to have shortness of breath, chills and poor appetite. At the emergency room, temperature 100.5.  86% on room air.  Needed 2 L oxygen.  Creatinine 4.15.  Baseline creatinine 0.9 few days ago.  Chest x-ray showed progressive bilateral airspace disease new since 8/13.  Admitted with acute renal failure and COVID-19 pneumonia.   Assessment & Plan:   Principal Problem:   Pneumonia due to COVID-19 virus Active Problems:   Acute respiratory failure with hypoxemia (HCC)   AKI (acute kidney injury) ([place])   Hyponatremia  Pneumonia due to COVID-19 virus: Was admitted due to significant symptoms.  Most of her pulmonary symptoms have improved.  She is on room air. chest physiotherapy, incentive spirometry, deep breathing exercises, sputum induction, mucolytic's and bronchodilators. Supplemental oxygen to keep saturations more than 90%. Covid directed therapy with , Treated with IV steroids and oral steroids.  Improved.  With increasing BUN and improvement of symptomatology, all steroids discontinued.     remdesivir , finished therapy. antibiotics , not indicated. Inflammatory markers improving.  COVID-19 Labs  Recent Labs    08/07/20 0936 08/08/20 0354  DDIMER 2.37* 2.19*  CRP 3.5* 1.9*    Lab Results  Component Value Date   SARSCOV2NAA POSITIVE (A) 07/30/2020   SARSCOV2NAA RESULT: NEGATIVE 07/15/2020   [place] Not Detected 10/27/2019   [place] NEGATIVE 10/17/2019   SpO2: 92 % O2 Flow Rate (L/min): 2 L/min  Acute kidney injury: Patient continues to have severe nonoliguric  acute kidney injury due to significant dehydration and ATN.  Renal ultrasound was without any hydronephrosis or obstruction.  It does show chronic kidney disease. Recent creatinine was 0.95 on 10/31.   Followed by nephrology. IV fluid discontinued.  On oral bicarb. Patient continues to have worsening renal functions, closely watching. As per nephrology. Probably plateaued. Discharge planning as per nephrology. If episodic outpatient monitoring can be done, she can probably go home.  Hyponatremia: Due to above.  Continue to monitor. Improving.  Hypertension: Blood pressures fairly stable. Amlodipine.  Holding lisinopril hydrochlorothiazide.  Hyperglycemia in the setting of noninsulin-dependent type 2 diabetes: Now with steroid use: On Janumet at home. Remains on insulin. Long-acting insulin added. Cannot be on Janumet with renal functions.  Will discharge with insulin until renal functions improved. Steroids discontinued.  Decrease the dose of long-acting insulin.  DVT prophylaxis: heparin injection 7,500 Units Start: 08/03/20 1400   Code Status: Full code Family Communication: Patient herself is talking to her family. Disposition Plan: Status is: Inpatient  Remains inpatient appropriate because:Persistent severe electrolyte disturbances, IV treatments appropriate due to intensity of illness or inability to take PO and Inpatient level of care appropriate due to severity of illness   Dispo: The patient is from: Home              Anticipated d/c is to: Home with home health PT OT              Anticipated d/c date is: When cleared by nephrology.              Patient currently is not medically stable to d/c.  Consultants:   Nephrology,   Procedures:   None  Antimicrobials:  Antibiotics Given (last 72 hours)    Date/Time Action Medication Dose Rate   08/07/20 0841 New Bag/Given   remdesivir 100 mg in sodium chloride 0.9 % 100 mL IVPB 100 mg 200 mL/hr          Subjective: Seen and examined.  Walking around with minimal help from the nursing staff.  Denies any nausea vomiting.  Denies any chest pain.  No exact urine output measurement, however patient thinks she is as usual. She wants to go home.  Objective: Vitals:   08/08/20 0428 08/08/20 1438 08/09/20 0416 08/09/20 0800  BP: (!) 169/81 (!) 170/76 (!) 154/79 (!) 131/49  Pulse: 78 84 70 77  Resp: 15 18 16 12   Temp: 97.9 F (36.6 C) 98.2 F (36.8 C) 98.1 F (36.7 C) 98 F (36.7 C)  TempSrc: Oral Oral Oral Oral  SpO2: 95% 95% 94% 92%  Weight:      Height:        Intake/Output Summary (Last 24 hours) at 08/09/2020 1254 Last data filed at 08/09/2020 0946 Gross per 24 hour  Intake 360 ml  Output 900 ml  Net -540 ml   Filed Weights   08/04/20 2339  Weight: 104.7 kg    Examination:  General exam: Looks comfortable.  Anxious for being in the hospital for a long time. Respiratory system: Clear to auscultation. Respiratory effort normal.  No added sounds. Cardiovascular system: S1 & S2 heard, RRR.  No edema. Gastrointestinal system: Abdomen is nondistended, soft and nontender. No organomegaly or masses felt. Normal bowel sounds heard.  Obese and pendulous. Central nervous system: Alert and oriented. No focal neurological deficits. Extremities: Symmetric 5 x 5 power. Skin: No rashes, lesions or ulcers Psychiatry: Judgement and insight appear normal. Mood & affect normal.    Data Reviewed: I have personally reviewed following labs and imaging studies  CBC: Recent Labs  Lab 08/04/20 0404 08/05/20 0500 08/06/20 0922 08/07/20 0936 08/08/20 0827  WBC 5.6 9.9 8.4 7.6 11.5*  NEUTROABS 4.7 9.2* 7.1 6.8 8.8*  HGB 9.1* 10.2* 10.0* 10.2* 10.4*  HCT 28.3* 30.8* 29.4* 29.6* 31.3*  MCV 86.0 84.2 83.1 83.1 83.0  PLT 300 422* 545* 566* 454*   Basic Metabolic Panel: Recent Labs  Lab 08/05/20 0500 08/06/20 0922 08/07/20 0936 08/08/20 0354 08/09/20 0754  NA 131* 133* 131* 130*  135  K 4.0 3.7 3.8 3.7 3.6  CL 98 98 97* 98 102  CO2 17* 17* 18* 20* 16*  GLUCOSE 262* 166* 328* 187* 82  BUN 97* 111* 116* 122* 126*  CREATININE 6.28* 6.98* 7.17* 7.25* 7.29*  CALCIUM 7.8* 8.1* 8.1* 8.1* 8.2*   GFR: Estimated Creatinine Clearance: 8.7 mL/min (A) (by C-G formula based on SCr of 7.29 mg/dL (H)). Liver Function Tests: Recent Labs  Lab 08/04/20 0404 08/05/20 0500 08/06/20 0922 08/07/20 0936 08/08/20 0354  AST 68* 56* 49* 50* 48*  ALT 29 27 26 29 28   ALKPHOS 33* 38 42 44 42  BILITOT 0.6 0.6 0.8 0.5 0.6  PROT 5.6* 6.1* 6.0* 5.6* 5.7*  ALBUMIN 1.5* 1.8* 1.9* 1.7* 1.9*   No results for input(s): LIPASE, AMYLASE in the last 168 hours. No results for input(s): AMMONIA in the last 168 hours. Coagulation Profile: No results for input(s): INR, PROTIME in the last 168 hours. Cardiac Enzymes: No results for input(s): CKTOTAL, CKMB, CKMBINDEX, TROPONINI in the last 168 hours. BNP (last 3 results) No results  for input(s): PROBNP in the last 8760 hours. HbA1C: No results for input(s): HGBA1C in the last 72 hours. CBG: Recent Labs  Lab 08/08/20 1238 08/08/20 1643 08/08/20 2105 08/09/20 0717 08/09/20 1215  GLUCAP 168* 169* 231* 84 140*   Lipid Profile: No results for input(s): CHOL, HDL, LDLCALC, TRIG, CHOLHDL, LDLDIRECT in the last 72 hours. Thyroid Function Tests: No results for input(s): TSH, T4TOTAL, FREET4, T3FREE, THYROIDAB in the last 72 hours. Anemia Panel: No results for input(s): VITAMINB12, FOLATE, FERRITIN, TIBC, IRON, RETICCTPCT in the last 72 hours. Sepsis Labs: Recent Labs  Lab 08/02/20 2327 08/03/20 0226 08/03/20 0319 08/04/20 0404 08/05/20 0500 08/06/20 0922 08/07/20 0936  PROCALCITON 0.53  --    < > 0.72 0.70 0.58 0.48  LATICACIDVEN 1.9 1.5  --   --   --   --   --    < > = values in this interval not displayed.    Recent Results (from the past 240 hour(s))  SARS Coronavirus 2 by RT PCR (hospital order, performed in [facility]  hospital lab) Nasopharyngeal Nasopharyngeal Swab     Status: Abnormal   Collection Time: 07/30/20  6:40 PM   Specimen: Nasopharyngeal Swab  Result Value Ref Range Status   SARS Coronavirus 2 POSITIVE (A) NEGATIVE Final    Comment: CRITICAL RESULT CALLED TO, READ BACK BY AND VERIFIED WITH: FRANKLIN,C.,RN @ 2153 07/30/20 TURNER,S. (NOTE) SARS-CoV-2 target nucleic acids are DETECTED  SARS-CoV-2 RNA is generally detectable in upper respiratory specimens  during the acute phase of infection.  Positive results are indicative  of the presence of the identified virus, but do not rule out bacterial infection or co-infection with other pathogens not detected by the test.  Clinical correlation with patient history and  other diagnostic information is necessary to determine patient infection status.  The expected result is negative.  Fact Sheet for Patients:   StrictlyIdeas.no   Fact Sheet for Healthcare Providers:   BankingDealers.co.za    This test is not yet approved or cleared by the [place] FDA and  has been authorized for detection and/or diagnosis of SARS-CoV-2 by FDA under an Emergency Use Authorization (EUA).  This EUA will remain in effect (m eaning this test can be used) for the duration of  the COVID-19 declaration under Section 564(b)(1) of the Act, 21 U.S.C. section 360-bbb-3(b)(1), unless the authorization is terminated or revoked sooner.  Performed at [facility], [place] [address] [place], [place] [address]   Blood Culture (routine x 2)     Status: None   Collection Time: 08/02/20 11:27 PM   Specimen: BLOOD  Result Value Ref Range Status   Specimen Description BLOOD RIGHT ARM  Final   Special Requests   Final    BOTTLES DRAWN AEROBIC ONLY Blood Culture results may not be optimal due to an inadequate volume of blood received in culture bottles   Culture   Final    NO GROWTH 5 DAYS Performed at [facility], [place] [address], [place], [place] [address]    Report Status 08/08/2020 FINAL  Final  Blood Culture (routine x 2)     Status: None   Collection Time: 08/03/20  2:26 AM   Specimen: BLOOD  Result Value Ref Range Status   Specimen Description BLOOD LEFT ARM  Final   Special Requests   Final    BOTTLES DRAWN AEROBIC AND ANAEROBIC Blood Culture adequate volume   Culture   Final    NO  GROWTH 5 DAYS Performed at [facility], [place] [address] [place], [place] [address]    Report Status 08/08/2020 FINAL  Final         Radiology Studies: No results found.      Scheduled Meds: . amLODipine  10 mg Oral Daily  . vitamin C  500 mg Oral Daily  . cholecalciferol  1,000 Units Oral Daily  . heparin  7,500 Units Subcutaneous Q8H  . hydrALAZINE  25 mg Oral Q8H  . insulin aspart  0-15 Units Subcutaneous TID WC  . insulin aspart  0-5 Units Subcutaneous QHS  . insulin detemir  4 Units Subcutaneous BID  . sodium bicarbonate  650 mg Oral BID  . zinc sulfate  220 mg Oral Daily   Continuous Infusions:    LOS: 6 days    Time spent: 30 minutes    Barb Merino, MD [facility] Pager [phone]

## 2020-08-09 NOTE — Progress Notes (Signed)
[facility] Progress Note   Veronica Simmons is a/an 69 y.o. female with a past medical history significant for HTN, HLD, DM 2, anemia, diastolic heart failure, CKD G1A3 who present w/ COVID PNA c/b AKI.  Assessment/ Plan:   1. Acute kidney Injury - Patient's kidney function is stable today with CR of 7.29 (GFR of. Patient admits to working on PO intake and trying to stay hydrated. She has good urine output and denies dysuria or hematuria. She denies any overt uremic symptoms, including chang in teste, pruritis, or confusion.  AKI thought to be pre-renal/ATn secondary to COVID-19 infections and dehydration. Waiting of renal function to improve.  - Continue to encourage hydration . - Monitor kidney function daily.  - Avoid nephrotoxic medications and GFR dose medications. - No emergent needs for dialysis  - Continue bicarb supplementation.   2. AGMA - likely secondary to AKI. renal function has stabilized. Waiting on sings of improvement. No emergent need for dialysis.  - Continue to monitor closely for dialysis needs - Continue bicarb therapy.   3. Hyponatremia:  - Resolved   4. HTN - normotensive today. - Continue to monitor  5. COVID-19 Pneumonia - patient appears to be improving. She denies SHOB. - Defer management to primary team.     Subjective:   Patient states that she is doing well today. She denies new symptoms including change in taste, pruritis, confusion, chest pain, or worsening shortness of breath. She admits to good urine output without hematuria or dysuria.    Objective:   BP (!) 131/49 (BP Location: Right Arm)   Pulse 77   Temp 98 F (36.7 C) (Oral)   Resp 12   Ht 5\' 5"  (1.651 m)   Wt 104.7 kg   SpO2 92%   BMI 38.41 kg/m   Intake/Output Summary (Last 24 hours) at 08/09/2020 1143 Last data filed at 08/09/2020 0946 Gross per 24 hour  Intake 600 ml  Output 1300 ml  Net -700 ml   Weight change:   Physical Exam: Physical  Exam Constitutional:      Appearance: Normal appearance.  HENT:     Head: Normocephalic and atraumatic.  Eyes:     Extraocular Movements: Extraocular movements intact.  Cardiovascular:     Rate and Rhythm: Normal rate.     Pulses: Normal pulses.     Heart sounds: Normal heart sounds.  Pulmonary:     Effort: Pulmonary effort is normal. No respiratory distress.  Abdominal:     General: Bowel sounds are normal.     Palpations: Abdomen is soft.     Tenderness: There is no abdominal tenderness.  Musculoskeletal:        General: No swelling. Normal range of motion.     Cervical back: Normal range of motion.     Right lower leg: Edema (nopitting, minimal) present.     Left lower leg: Edema (nonpitting, minimal) present.  Skin:    General: Skin is warm and dry.  Neurological:     Mental Status: She is alert and oriented to person, place, and time. Mental status is at baseline.  Psychiatric:        Mood and Affect: Mood normal.     Imaging: No results found.  Labs: BMET Recent Labs  Lab 08/04/20 1303 08/04/20 2223 08/05/20 0500 08/06/20 0922 08/07/20 0936 08/08/20 0354 08/09/20 0754  NA 129* 127* 131* 133* 131* 130* 135  K 4.0 4.0 4.0 3.7 3.8 3.7 3.6  CL 96*  95* 98 98 97* 98 102  CO2 17* 19* 17* 17* 18* 20* 16*  GLUCOSE 327* 320* 262* 166* 328* 187* 82  BUN 87* 93* 97* 111* 116* 122* 126*  CREATININE 5.62* 5.92* 6.28* 6.98* 7.17* 7.25* 7.29*  CALCIUM 7.8* 7.4* 7.8* 8.1* 8.1* 8.1* 8.2*   CBC Recent Labs  Lab 08/05/20 0500 08/06/20 0922 08/07/20 0936 08/08/20 0827  WBC 9.9 8.4 7.6 11.5*  NEUTROABS 9.2* 7.1 6.8 8.8*  HGB 10.2* 10.0* 10.2* 10.4*  HCT 30.8* 29.4* 29.6* 31.3*  MCV 84.2 83.1 83.1 83.0  PLT 422* 545* 566* 650*    Medications:    . amLODipine  10 mg Oral Daily  . vitamin C  500 mg Oral Daily  . cholecalciferol  1,000 Units Oral Daily  . heparin  7,500 Units Subcutaneous Q8H  . hydrALAZINE  25 mg Oral Q8H  . insulin aspart  0-15 Units  Subcutaneous TID WC  . insulin aspart  0-5 Units Subcutaneous QHS  . insulin detemir  7 Units Subcutaneous BID  . sodium bicarbonate  650 mg Oral BID  . zinc sulfate  220 mg Oral Daily      Marianna Payment, D.O. [facility], PGY-2 Pager: [phone], Phone: [phone] Date 08/09/2020 Time 12:07 PM

## 2020-08-13 ENCOUNTER — Observation Stay (HOSPITAL_COMMUNITY)
Admission: EM | Admit: 2020-08-13 | Discharge: 2020-08-14 | Disposition: A | Discharge status: Home / Self Care | Payer: Medicare Other | Attending: Internal Medicine | Admitting: Internal Medicine

## 2020-08-13 ENCOUNTER — Encounter (HOSPITAL_COMMUNITY): Payer: Self-pay

## 2020-08-13 ENCOUNTER — Emergency Department (HOSPITAL_COMMUNITY): Payer: Medicare Other

## 2020-08-13 DIAGNOSIS — Z79899 Other long term (current) drug therapy: Secondary | ICD-10-CM | POA: Insufficient documentation

## 2020-08-13 DIAGNOSIS — R531 Weakness: Secondary | ICD-10-CM | POA: Diagnosis present

## 2020-08-13 DIAGNOSIS — E785 Hyperlipidemia, unspecified: Secondary | ICD-10-CM | POA: Diagnosis present

## 2020-08-13 DIAGNOSIS — I10 Essential (primary) hypertension: Secondary | ICD-10-CM | POA: Diagnosis not present

## 2020-08-13 DIAGNOSIS — N179 Acute kidney failure, unspecified: Secondary | ICD-10-CM | POA: Diagnosis not present

## 2020-08-13 DIAGNOSIS — D649 Anemia, unspecified: Secondary | ICD-10-CM | POA: Diagnosis not present

## 2020-08-13 DIAGNOSIS — Z6837 Body mass index (BMI) 37.0-37.9, adult: Secondary | ICD-10-CM | POA: Insufficient documentation

## 2020-08-13 DIAGNOSIS — E1140 Type 2 diabetes mellitus with diabetic neuropathy, unspecified: Secondary | ICD-10-CM | POA: Insufficient documentation

## 2020-08-13 DIAGNOSIS — J1282 Pneumonia due to coronavirus disease 2019: Secondary | ICD-10-CM | POA: Diagnosis not present

## 2020-08-13 DIAGNOSIS — Z794 Long term (current) use of insulin: Secondary | ICD-10-CM | POA: Insufficient documentation

## 2020-08-13 DIAGNOSIS — Z20822 Contact with and (suspected) exposure to covid-19: Secondary | ICD-10-CM | POA: Diagnosis not present

## 2020-08-13 DIAGNOSIS — E669 Obesity, unspecified: Secondary | ICD-10-CM | POA: Diagnosis present

## 2020-08-13 LAB — IRON AND TIBC
Iron: 48 ug/dL (ref 28–170)
Saturation Ratios: 23 % (ref 10.4–31.8)
TIBC: 209 ug/dL — ABNORMAL LOW (ref 250–450)
UIBC: 161 ug/dL

## 2020-08-13 LAB — URINALYSIS, ROUTINE W REFLEX MICROSCOPIC
Bacteria, UA: NONE SEEN
Bilirubin Urine: NEGATIVE
Glucose, UA: >=500 mg/dL — AB
Hgb urine dipstick: NEGATIVE
Ketones, ur: NEGATIVE mg/dL
Leukocytes,Ua: NEGATIVE
Nitrite: NEGATIVE
Protein, ur: >=300 mg/dL — AB
Specific Gravity, Urine: 1.011 (ref 1.005–1.030)
pH: 6 (ref 5.0–8.0)

## 2020-08-13 LAB — CBC WITH DIFFERENTIAL/PLATELET
Abs Immature Granulocytes: 0.36 10*3/uL — ABNORMAL HIGH (ref 0.00–0.07)
Basophils Absolute: 0 10*3/uL (ref 0.0–0.1)
Basophils Relative: 0 %
Eosinophils Absolute: 0.2 10*3/uL (ref 0.0–0.5)
Eosinophils Relative: 2 %
HCT: 24.6 % — ABNORMAL LOW (ref 36.0–46.0)
Hemoglobin: 7.6 g/dL — ABNORMAL LOW (ref 12.0–15.0)
Immature Granulocytes: 3 %
Lymphocytes Relative: 9 %
Lymphs Abs: 1 10*3/uL (ref 0.7–4.0)
MCH: 27.4 pg (ref 26.0–34.0)
MCHC: 30.9 g/dL (ref 30.0–36.0)
MCV: 88.8 fL (ref 80.0–100.0)
Monocytes Absolute: 0.6 10*3/uL (ref 0.1–1.0)
Monocytes Relative: 5 %
Neutro Abs: 8.9 10*3/uL — ABNORMAL HIGH (ref 1.7–7.7)
Neutrophils Relative %: 81 %
Platelets: 352 10*3/uL (ref 150–400)
RBC: 2.77 MIL/uL — ABNORMAL LOW (ref 3.87–5.11)
RDW: 15.3 % (ref 11.5–15.5)
WBC: 11.1 10*3/uL — ABNORMAL HIGH (ref 4.0–10.5)
nRBC: 0 % (ref 0.0–0.2)

## 2020-08-13 LAB — TROPONIN I (HIGH SENSITIVITY): Troponin I (High Sensitivity): 44 ng/L — ABNORMAL HIGH (ref <18)

## 2020-08-13 LAB — MAGNESIUM: Magnesium: 2.2 mg/dL (ref 1.7–2.4)

## 2020-08-13 LAB — COMPREHENSIVE METABOLIC PANEL
ALT: 19 U/L (ref 0–44)
AST: 31 U/L (ref 15–41)
Albumin: 1.9 g/dL — ABNORMAL LOW (ref 3.5–5.0)
Alkaline Phosphatase: 38 U/L (ref 38–126)
Anion gap: 14 (ref 5–15)
BUN: 84 mg/dL — ABNORMAL HIGH (ref 8–23)
CO2: 22 mmol/L (ref 22–32)
Calcium: 7.9 mg/dL — ABNORMAL LOW (ref 8.9–10.3)
Chloride: 100 mmol/L (ref 98–111)
Creatinine, Ser: 6.06 mg/dL — ABNORMAL HIGH (ref 0.44–1.00)
GFR calc Af Amer: 8 mL/min — ABNORMAL LOW (ref >60)
GFR calc non Af Amer: 7 mL/min — ABNORMAL LOW (ref >60)
Glucose, Bld: 217 mg/dL — ABNORMAL HIGH (ref 70–99)
Potassium: 3.6 mmol/L (ref 3.5–5.1)
Sodium: 136 mmol/L (ref 135–145)
Total Bilirubin: 0.4 mg/dL (ref 0.3–1.2)
Total Protein: 5.3 g/dL — ABNORMAL LOW (ref 6.5–8.1)

## 2020-08-13 LAB — RETICULOCYTES
Immature Retic Fract: 8.3 % (ref 2.3–15.9)
RBC.: 2.65 MIL/uL — ABNORMAL LOW (ref 3.87–5.11)
Retic Count, Absolute: 49.6 10*3/uL (ref 19.0–186.0)
Retic Ct Pct: 1.9 % (ref 0.4–3.1)

## 2020-08-13 LAB — PREPARE RBC (CROSSMATCH)

## 2020-08-13 LAB — TSH: TSH: 2.219 u[IU]/mL (ref 0.350–4.500)

## 2020-08-13 LAB — FERRITIN: Ferritin: 830 ng/mL — ABNORMAL HIGH (ref 11–307)

## 2020-08-13 LAB — BRAIN NATRIURETIC PEPTIDE: B Natriuretic Peptide: 123.2 pg/mL — ABNORMAL HIGH (ref 0.0–100.0)

## 2020-08-13 LAB — FOLATE: Folate: 21.4 ng/mL (ref >5.9)

## 2020-08-13 LAB — VITAMIN B12: Vitamin B-12: 1229 pg/mL — ABNORMAL HIGH (ref 180–914)

## 2020-08-13 MED ORDER — HEPARIN SODIUM (PORCINE) 5000 UNIT/ML IJ SOLN
5000.0000 [IU] | Freq: Three times a day (TID) | INTRAMUSCULAR | Status: DC
  Administered 2020-08-14 (×2): 5000 [IU] via SUBCUTANEOUS
  Filled 2020-08-13 (×2): qty 1

## 2020-08-13 MED ORDER — SODIUM CHLORIDE 0.9% IV SOLUTION: Freq: Once | INTRAVENOUS | Status: AC

## 2020-08-13 MED ORDER — ONDANSETRON HCL 4 MG PO TABS: 4.0000 mg | ORAL_TABLET | Freq: Four times a day (QID) | ORAL | Status: DC | PRN

## 2020-08-13 MED ORDER — ONDANSETRON HCL 4 MG/2ML IJ SOLN: 4.0000 mg | Freq: Four times a day (QID) | INTRAMUSCULAR | Status: DC | PRN

## 2020-08-13 MED ORDER — ACETAMINOPHEN 325 MG PO TABS: 650.0000 mg | ORAL_TABLET | Freq: Four times a day (QID) | ORAL | Status: DC | PRN

## 2020-08-13 MED ORDER — ACETAMINOPHEN 650 MG RE SUPP: 650.0000 mg | Freq: Four times a day (QID) | RECTAL | Status: DC | PRN

## 2020-08-13 MED ORDER — POLYETHYLENE GLYCOL 3350 17 G PO PACK: 17.0000 g | PACK | Freq: Every day | ORAL | Status: DC | PRN

## 2020-08-13 NOTE — ED Notes (Signed)
Eliseo Squires daughter [phone] would like an update on the pt

## 2020-08-13 NOTE — ED Triage Notes (Signed)
Pt BIB [facility] for eval of generalized weakness and edema x 4days. EMS reports ascites and blt LE edema. PCP advised her to present here. Pt Covid positive, dx'd on 8/16.

## 2020-08-13 NOTE — H&P (Signed)
[facility] History and Physical  Veronica Simmons KVQ:[id number] DOB: May 28, 1951 DOA: 08/13/2020  Referring physician: ED  PCP: Katherina Mires, MD   Patient is coming from: Home  Chief Complaint: Fatigue, generalized weakness  HPI: Veronica Simmons is a 69 y.o. female with past medical history of hypertension, hyperlipidemia, diabetes mellitus, anemia, chronic diastolic heart failure and recent COVID-19 pneumonia presented to the hospital with complaints of generalized fatigue and weakness with increasing swelling of her lower extremities and abdomen.  Patient stated that she was fatigued and weak for some time now but for the last few days it was getting worse.  Patient had a telemedicine visit with her primary care physician today and was advised to come to the ED for labs and vital checkup.  Patient denies any shortness of breath, fever, chills or rigor.  Denies any chest pain or palpitation.  Denies any urinary urgency, frequency or dysuria.  Has been urinating okay.  He admits to having low appetite though.  Patient was recently diagnosed of COVID-19 disease on 07/30/2020 and had completed treatment in the hospital.  She denies any nausea, vomiting or diarrhea.  ED Course: In the ED, patient was noted to have WBC of 11.1, hemoglobin of 7.6, creatinine of 6.06 LFTs were within normal limits.  Magnesium was 2.2.  Patient was 3.6.  She did have three-point decrease in her hemoglobin since last admission. Mild improvement of her creatinine levels compared to last admission.  Patient was then considered for observation in the hospital and for 1 unit of packed RBC.  Review of Systems:  All systems were reviewed and were negative unless otherwise mentioned in the HPI  Past Medical History:  Diagnosis Date  . ALLERGIC RHINITIS 02/19/2007   Qualifier: Diagnosis of  By: Lorne Skeens MD, VALENICA    . Allergy    seasonal  . Anemia 1970s   on iron   . Arthritis   .  Cubital tunnel syndrome on right 2020   Secondary to old fracture. Evaluated by ortho. [facility] offered surgery but patient refused. Numbness and burning pain 4th and 5th digit.    . Diabetes mellitus 2000   Never on insulin   . High cholesterol   . Hypertension 2000   Past Surgical History:  Procedure Laterality Date  . DG HAND RIGHT COMPLETE ([place] HX) Right 2020   right hand/wrist/elbow surgery to fix carpel tunnel/pinched nerve    Social History:  reports that she has never smoked. She has never used smokeless tobacco. She reports that she does not drink alcohol and does not use drugs.  Allergies  Allergen Reactions  . Levofloxacin Hives    Family History  Problem Relation Age of Onset  . Early death Mother 43       shot   . Heart disease Father 24  . Diabetes Daughter   . Kidney disease Daughter        on dialysis   . Colon polyps Daughter   . Diabetes Maternal Aunt   . Cancer Paternal Uncle        Breast Cancer   . Breast cancer Maternal Grandmother   . Colon cancer Neg Hx   . Esophageal cancer Neg Hx   . Stomach cancer Neg Hx   . Rectal cancer Neg Hx      Prior to Admission medications   Medication Sig Start Date End Date Taking? Authorizing Provider  amLODipine (NORVASC) 10 MG tablet Take 1 tablet (10 mg total)  by mouth daily. 10/18/19   Dana Allan I, MD  carvedilol (COREG) 25 MG tablet Take 25 mg by mouth 2 (two) times daily. 07/20/20   [provider]  Coenzyme Q10 (COQ10 PO) Take 1 tablet by mouth daily.    [provider]  ferrous sulfate 325 (65 FE) MG tablet Take 1 tablet by mouth daily.    [provider]  glucose blood (ACCU-CHEK AVIVA PLUS) test strip Use as instructed 01/06/16   Marina Goodell A, MD  glucose blood (ACCU-CHEK SMARTVIEW) test strip Test blood sugar once daily 10/12/16   Haney, Alyssa A, MD  glucose blood test strip Use as instructed 12/29/15   Marina Goodell A, MD  hydrALAZINE (APRESOLINE) 25 MG tablet  Take 1 tablet (25 mg total) by mouth every 8 (eight) hours. 08/09/20 09/08/20  Barb Merino, MD  insulin lispro (HUMALOG) 100 UNIT/ML KwikPen Inject 0.01-0.09 mLs (1-9 Units total) into the skin 3 (three) times daily before meals. Blood sugars 70-120: no Insulin 121-150: 2 units 151-200: 3 units 201-250: 4 units 251-300: 5 units 301-350: 6 units 351-400: 9 units More than 400 use 9 units and call your doctor 08/09/20   Barb Merino, MD  Lancets [facility] ULTRASOFT) lancets Use as instructed 08/27/15   Marina Goodell A, MD  sodium bicarbonate 650 MG tablet Take 2 tablets (1,300 mg total) by mouth 2 (two) times daily. 08/09/20 09/08/20  Barb Merino, MD  traZODone (DESYREL) 100 MG tablet Take 200 mg by mouth at bedtime as needed for sleep.  03/18/20   [provider]    Physical Exam: Vitals:   08/13/20 1202 08/13/20 1210 08/13/20 1400 08/13/20 1430  BP:  131/67 (!) 147/67 (!) 143/68  Pulse:  69 64 69  Resp:  19 (!) 9 18  SpO2:  98% 100% 100%  Weight: 100.2 kg     Height: 5\' 5"  (1.651 m)      Wt Readings from Last 3 Encounters:  08/13/20 100.2 kg  08/04/20 104.7 kg  07/30/20 98.9 kg   Body mass index is 36.78 kg/m.  General: Morbidly obese, alert awake oriented, not in obvious distress HENT: Normocephalic, pupils equally reacting to light and accommodation.  Mild scleral pallor noted.  Oral mucosa is moist.  Chest:  Clear breath sounds.  Diminished breath sounds bilaterally. No crackles or wheezes.  CVS: S1 &S2 heard. No murmur.  Regular rate and rhythm. Abdomen: Soft, nontender, nondistended.  Bowel sounds are heard.  Liver is not palpable, no abdominal mass palpated, obese abdomen.  Digital rectal examination done in the ED showed no gross blood. Extremities: No cyanosis, clubbing bilateral lower extremity edema noted tenderness on palpation of the extremities.   Peripheral pulses are palpable. Psych: Alert, awake and oriented, normal mood CNS:  No cranial nerve deficits.  Power  equal in all extremities.  Generalized weakness noted. Skin: Warm and dry.  No rashes noted.  Labs on Admission:   CBC: Recent Labs  Lab 08/07/20 0936 08/08/20 0827 08/13/20 1220  WBC 7.6 11.5* 11.1*  NEUTROABS 6.8 8.8* 8.9*  HGB 10.2* 10.4* 7.6*  HCT 29.6* 31.3* 24.6*  MCV 83.1 83.0 88.8  PLT 566* 650* 295    Basic Metabolic Panel: Recent Labs  Lab 08/07/20 0936 08/08/20 0354 08/09/20 0754 08/13/20 1220  NA 131* 130* 135 136  K 3.8 3.7 3.6 3.6  CL 97* 98 102 100  CO2 18* 20* 16* 22  GLUCOSE 328* 187* 82 217*  BUN 116* 122* 126* 84*  CREATININE 7.17* 7.25* 7.29* 6.06*  CALCIUM 8.1* 8.1* 8.2* 7.9*  MG  --   --   --  2.2    Liver Function Tests: Recent Labs  Lab 08/07/20 0936 08/08/20 0354 08/13/20 1220  AST 50* 48* 31  ALT 29 28 19   ALKPHOS 44 42 38  BILITOT 0.5 0.6 0.4  PROT 5.6* 5.7* 5.3*  ALBUMIN 1.7* 1.9* 1.9*   No results for input(s): LIPASE, AMYLASE in the last 168 hours. No results for input(s): AMMONIA in the last 168 hours.  Cardiac Enzymes: No results for input(s): CKTOTAL, CKMB, CKMBINDEX, TROPONINI in the last 168 hours.  BNP (last 3 results) Recent Labs    10/17/19 1627 08/13/20 1221  BNP 215.4* 123.2*    ProBNP (last 3 results) No results for input(s): PROBNP in the last 8760 hours.  CB Radiological Exams on Admission: DG Chest Port 1 View  Result Date: 08/13/2020 CLINICAL DATA:  Generalized weakness and edema for 4 days, lower extremity edema, COVID-19 positive diagnosed 08/02/2020 EXAM: PORTABLE CHEST 1 VIEW COMPARISON:  Portable exam 1229 hours compared to 08/02/2020 FINDINGS: Normal heart size, mediastinal contours, and pulmonary vascularity. Improved pulmonary infiltrates. No pleural effusion or pneumothorax. Bones demineralized. IMPRESSION: Improved BILATERAL pulmonary infiltrates. Electronically Signed   By: Lavonia Dana M.D.   On: 08/13/2020 12:40    EKG: Not available for review.  Assessment/Plan Principal Problem:    Symptomatic anemia Active Problems:   Type 2 diabetes, controlled, with neuropathy (HCC)   Hyperlipidemia   OBESITY, NOS   HYPERTENSION, BENIGN SYSTEMIC   AKI (acute kidney injury) ([place])   Generalized weakness  Generalized weakness, fatigue.  Likely secondary to anemia and recent COVID-19 illness.  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  Will obtain physical therapy evaluation, transfuse 1 unit of packed RBC.  Symptomatic anemia.  Three-point hemoglobin dropped noted.  Will transfuse 1 unit of packed RBC.  Check anemia panel, stool occult blood.  Acute kidney injury improving.  Secondary to ATN on last admission.  Was seen by nephrology on last admission.  Nonoliguric.  Slowly trending down creatinine levels.  Patient does follow-up with Dr. Carolin Sicks nephrology as outpatient  Recent COVID-19 pneumonia.  Chest x-ray today with improving infiltrates.  Patient received remdesivir and IV steroids on last admission.  Not on any oxygen at this time.  Diabetes mellitus type 2 with peripheral neuropathy.  Continue on sliding scale insulin, Accu-Cheks, diabetic diet.  Morbid obesity.  Would benefit from weight loss as outpatient.  DVT Prophylaxis: Heparin subcu  Consultant: None  Code Status: Full code  Microbiology none  Antibiotics: None  Family Communication:  Patients' condition and plan of care including tests being ordered have been discussed with the patient who indicate understanding and agree with the plan.   Status is: Observation  Dispo: The patient is from: Home              Anticipated d/c is to: Home              Anticipated d/c date is: 1 day       Severity of Illness: The appropriate patient status for this patient is OBSERVATION. Observation status is judged to be reasonable and necessary in order to provide the required intensity of service to ensure the patient's safety. The patient's presenting symptoms, physical exam findings, and initial radiographic and laboratory data in  the context of their medical condition is felt to place them at decreased risk for further clinical deterioration. Furthermore, it is anticipated  that the patient will be medically stable for discharge from the hospital within 2 midnights of admission.   Signed, Flora Lipps, MD [facility] 08/13/2020

## 2020-08-13 NOTE — ED Provider Notes (Signed)
[facility] EMERGENCY DEPARTMENT Provider Note   CSN: [id number] Arrival date & time: 08/13/20  1153     History Chief Complaint  Patient presents with  . Weakness  . Edema    Veronica Simmons is a 69 y.o. female.  69 yo F with a cc of lower extremity edema and fatigue.  Patient states she has been fatigued for some time but worsening over the past few days or so.  She saw her family doctor via televisit and was told she needed to come to the ED for set of vital signs and lab work.  Patient denies any chest pain or pressure denies shortness of breath.  She feels fatigued just sitting.  She was diagnosed with novel coronavirus about 3 weeks ago.  No longer having cough fevers or chills has been eating and drinking normally.  No diarrhea.  No urinary symptoms.  The history is provided by the patient.  Weakness Associated symptoms: no arthralgias, no chest pain, no dizziness, no dysuria, no fever, no headaches, no myalgias, no nausea, no shortness of breath, no urgency and no vomiting   Illness Severity:  Moderate Onset quality:  Gradual Duration:  1 week Timing:  Constant Progression:  Unchanged Chronicity:  New Associated symptoms: no chest pain, no congestion, no fever, no headaches, no myalgias, no nausea, no rhinorrhea, no shortness of breath, no vomiting and no wheezing        Past Medical History:  Diagnosis Date  . ALLERGIC RHINITIS 02/19/2007   Qualifier: Diagnosis of  By: Lorne Skeens MD, VALENICA    . Allergy    seasonal  . Anemia 1970s   on iron   . Arthritis   . Cubital tunnel syndrome on right 2020   Secondary to old fracture. Evaluated by ortho. [facility] offered surgery but patient refused. Numbness and burning pain 4th and 5th digit.    . Diabetes mellitus 2000   Never on insulin   . High cholesterol   . Hypertension 2000    Patient Active Problem List   Diagnosis Date Noted  . Pneumonia due to COVID-19 virus  08/03/2020  . Acute respiratory failure with hypoxemia ([place]) 08/03/2020  . AKI (acute kidney injury) ([place]) 08/03/2020  . Hyponatremia 08/03/2020  . New onset of congestive heart failure ([place]) 10/17/2019  . Hypertensive urgency 10/17/2019  . Right sided weakness 03/07/2018  . HTN (hypertension) 03/07/2018  . Obesity (BMI 30-39.9) 03/07/2018  . Skin papules, generalized 08/04/2016  . Skin lesion of right lower limb 06/19/2016  . Leg swelling 05/12/2016  . Subconjunctival hemorrhage of right eye 04/27/2016  . Diarrhea 01/09/2016  . Health care maintenance 06/28/2015  . Anemia 12/21/2014  . Palpitations 01/07/2014  . Recurrent yeast vaginitis 11/05/2013  . Recurrent genital herpes 07/17/2013  . Hot flashes not due to menopause 11/27/2012  . Cubital tunnel syndrome on right 10/31/2012  . Type 2 diabetes, controlled, with neuropathy ([place]) 02/14/2007  . HYPERLIPIDEMIA 02/14/2007  . OBESITY, NOS 02/14/2007  . HYPERTENSION, BENIGN SYSTEMIC 02/14/2007  . ARTHRITIS 02/14/2007  . Insomnia 02/14/2007    Past Surgical History:  Procedure Laterality Date  . DG HAND RIGHT COMPLETE ([place] HX) Right 2020   right hand/wrist/elbow surgery to fix carpel tunnel/pinched nerve     OB History   No obstetric history on file.     Family History  Problem Relation Age of Onset  . Early death Mother 20       shot   .  Heart disease Father 69  . Diabetes Daughter   . Kidney disease Daughter        on dialysis   . Colon polyps Daughter   . Diabetes Maternal Aunt   . Cancer Paternal Uncle        Breast Cancer   . Breast cancer Maternal Grandmother   . Colon cancer Neg Hx   . Esophageal cancer Neg Hx   . Stomach cancer Neg Hx   . Rectal cancer Neg Hx     Social History   Tobacco Use  . Smoking status: Never Smoker  . Smokeless tobacco: Never Used  Vaping Use  . Vaping Use: Never used  Substance Use Topics  . Alcohol use: No  . Drug use: No    Home Medications Prior to Admission  medications   Medication Sig Start Date End Date Taking? Authorizing Provider  amLODipine (NORVASC) 10 MG tablet Take 1 tablet (10 mg total) by mouth daily. 10/18/19   Dana Allan I, MD  carvedilol (COREG) 25 MG tablet Take 25 mg by mouth 2 (two) times daily. 07/20/20   [provider]  Coenzyme Q10 (COQ10 PO) Take 1 tablet by mouth daily.    [provider]  ferrous sulfate 325 (65 FE) MG tablet Take 1 tablet by mouth daily.    [provider]  glucose blood (ACCU-CHEK AVIVA PLUS) test strip Use as instructed 01/06/16   Marina Goodell A, MD  glucose blood (ACCU-CHEK SMARTVIEW) test strip Test blood sugar once daily 10/12/16   Haney, Alyssa A, MD  glucose blood test strip Use as instructed 12/29/15   Marina Goodell A, MD  hydrALAZINE (APRESOLINE) 25 MG tablet Take 1 tablet (25 mg total) by mouth every 8 (eight) hours. 08/09/20 09/08/20  Barb Merino, MD  insulin lispro (HUMALOG) 100 UNIT/ML KwikPen Inject 0.01-0.09 mLs (1-9 Units total) into the skin 3 (three) times daily before meals. Blood sugars 70-120: no Insulin 121-150: 2 units 151-200: 3 units 201-250: 4 units 251-300: 5 units 301-350: 6 units 351-400: 9 units More than 400 use 9 units and call your doctor 08/09/20   Barb Merino, MD  Lancets [facility] ULTRASOFT) lancets Use as instructed 08/27/15   Marina Goodell A, MD  sodium bicarbonate 650 MG tablet Take 2 tablets (1,300 mg total) by mouth 2 (two) times daily. 08/09/20 09/08/20  Barb Merino, MD  traZODone (DESYREL) 100 MG tablet Take 200 mg by mouth at bedtime as needed for sleep.  03/18/20   [provider]    Allergies    Levofloxacin  Review of Systems   Review of Systems  Constitutional: Negative for chills and fever.  HENT: Negative for congestion and rhinorrhea.   Eyes: Negative for redness and visual disturbance.  Respiratory: Negative for shortness of breath and wheezing.   Cardiovascular: Negative for chest pain and palpitations.   Gastrointestinal: Negative for nausea and vomiting.  Genitourinary: Negative for dysuria and urgency.  Musculoskeletal: Negative for arthralgias and myalgias.  Skin: Negative for pallor and wound.  Neurological: Positive for weakness. Negative for dizziness and headaches.    Physical Exam Updated Vital Signs BP 131/67   Pulse 69   Resp 19   Ht 5\' 5"  (1.651 m)   Wt 100.2 kg   SpO2 98%   BMI 36.78 kg/m   Physical Exam Vitals and nursing note reviewed.  Constitutional:      General: She is not in acute distress.    Appearance: She is well-developed. She is not  diaphoretic.  HENT:     Head: Normocephalic and atraumatic.  Eyes:     Pupils: Pupils are equal, round, and reactive to light.  Cardiovascular:     Rate and Rhythm: Normal rate and regular rhythm.     Heart sounds: No murmur heard.  No friction rub. No gallop.   Pulmonary:     Effort: Pulmonary effort is normal.     Breath sounds: No wheezing or rales.  Abdominal:     General: There is no distension.     Palpations: Abdomen is soft.     Tenderness: There is no abdominal tenderness.  Genitourinary:    Comments: Hard brown stool in the vault.  No gross blood.  No hemorrhoids. Musculoskeletal:        General: No tenderness.     Cervical back: Normal range of motion and neck supple.     Right lower leg: Edema present.     Left lower leg: Edema present.     Comments: 1+ to the shins bilaterally  Skin:    General: Skin is warm and dry.  Neurological:     Mental Status: She is alert and oriented to person, place, and time.  Psychiatric:        Behavior: Behavior normal.     ED Results / Procedures / Treatments   Labs (all labs ordered are listed, but only abnormal results are displayed) Labs Reviewed  CBC WITH DIFFERENTIAL/PLATELET - Abnormal; Notable for the following components:      Result Value   WBC 11.1 (*)    RBC 2.77 (*)    Hemoglobin 7.6 (*)    HCT 24.6 (*)    Neutro Abs 8.9 (*)    Abs Immature  Granulocytes 0.36 (*)    All other components within normal limits  COMPREHENSIVE METABOLIC PANEL - Abnormal; Notable for the following components:   Glucose, Bld 217 (*)    BUN 84 (*)    Creatinine, Ser 6.06 (*)    Calcium 7.9 (*)    Total Protein 5.3 (*)    Albumin 1.9 (*)    GFR calc non Af Amer 7 (*)    GFR calc Af Amer 8 (*)    All other components within normal limits  BRAIN NATRIURETIC PEPTIDE - Abnormal; Notable for the following components:   B Natriuretic Peptide 123.2 (*)    All other components within normal limits  TROPONIN I (HIGH SENSITIVITY) - Abnormal; Notable for the following components:   Troponin I (High Sensitivity) 44 (*)    All other components within normal limits  MAGNESIUM  TSH  URINALYSIS, ROUTINE W REFLEX MICROSCOPIC  PREPARE RBC (CROSSMATCH)  TYPE AND SCREEN    EKG EKG Interpretation  Date/Time:  Friday August 13 2020 12:07:52 EDT Ventricular Rate:  71 PR Interval:    QRS Duration: 67 QT Interval:  589 QTC Calculation: 636 R Axis:   10 Text Interpretation: Sinus rhythm Short PR interval Probable left atrial enlargement Borderline repol abnrm, inferolateral leads Prolonged QT interval No significant change since last tracing Confirmed by Deno Etienne [phone]) on 08/13/2020 12:11:09 PM   Radiology DG Chest Port 1 View  Result Date: 08/13/2020 CLINICAL DATA:  Generalized weakness and edema for 4 days, lower extremity edema, COVID-19 positive diagnosed 08/02/2020 EXAM: PORTABLE CHEST 1 VIEW COMPARISON:  Portable exam 1229 hours compared to 08/02/2020 FINDINGS: Normal heart size, mediastinal contours, and pulmonary vascularity. Improved pulmonary infiltrates. No pleural effusion or pneumothorax. Bones demineralized. IMPRESSION: Improved BILATERAL pulmonary  infiltrates. Electronically Signed   By: Lavonia Dana M.D.   On: 08/13/2020 12:40    Procedures Procedures (including critical care time)  Medications Ordered in ED Medications  0.9 %  sodium  chloride infusion (Manually program via Guardrails IV Fluids) (has no administration in time range)    ED Course  I have reviewed the triage vital signs and the nursing notes.  Pertinent labs & imaging results that were available during my care of the patient were reviewed by me and considered in my medical decision making (see chart for details).    MDM Rules/Calculators/A&P                          69 yo F with a chief complaints of fatigue.  This been going on for some time but she thinks worsening over the past week.  Saw her family doctor via telemetry visit and was told to come here for vital signs and laboratory evaluation.  She has trace edema to bilateral lower extremities.  No adventitious lung sounds for me on exam.  98% on room air without tachypnea.  Will obtain a laboratory evaluation chest x-ray UA troponin TSH BMP reassess.  The patient has had a 3 g hemoglobin drop in 5 days.  She denies any dark stool or blood in her stool.  Denies blood loss in any other way.  Rectal exam with hard brown stool.  No blood no hemorrhoids.  As the patient has symptomatic anemia we will give a unit of blood will discuss with medicine for admission.  CRITICAL CARE Performed by: Cecilio Asper   Total critical care time: 35 minutes  Critical care time was exclusive of separately billable procedures and treating other patients.  Critical care was necessary to treat or prevent imminent or life-threatening deterioration.  Critical care was time spent personally by me on the following activities: development of treatment plan with patient and/or surrogate as well as nursing, discussions with consultants, evaluation of patient's response to treatment, examination of patient, obtaining history from patient or surrogate, ordering and performing treatments and interventions, ordering and review of laboratory studies, ordering and review of radiographic studies, pulse oximetry and re-evaluation of  patient's condition.  Final Clinical Impression(s) / ED Diagnoses Final diagnoses:  Symptomatic anemia    Rx / DC Orders ED Discharge Orders    None       Deno Etienne, DO 08/13/20 1349

## 2020-08-13 NOTE — ED Notes (Signed)
Call the daughter Ammanda Dobbins at [phone] with an update

## 2020-08-14 ENCOUNTER — Other Ambulatory Visit: Payer: Self-pay

## 2020-08-14 ENCOUNTER — Encounter (HOSPITAL_COMMUNITY): Payer: Self-pay | Admitting: Internal Medicine

## 2020-08-14 DIAGNOSIS — D649 Anemia, unspecified: Secondary | ICD-10-CM

## 2020-08-14 LAB — COMPREHENSIVE METABOLIC PANEL
ALT: 18 U/L (ref 0–44)
AST: 29 U/L (ref 15–41)
Albumin: 1.8 g/dL — ABNORMAL LOW (ref 3.5–5.0)
Alkaline Phosphatase: 39 U/L (ref 38–126)
Anion gap: 15 (ref 5–15)
BUN: 72 mg/dL — ABNORMAL HIGH (ref 8–23)
CO2: 20 mmol/L — ABNORMAL LOW (ref 22–32)
Calcium: 8 mg/dL — ABNORMAL LOW (ref 8.9–10.3)
Chloride: 103 mmol/L (ref 98–111)
Creatinine, Ser: 5.63 mg/dL — ABNORMAL HIGH (ref 0.44–1.00)
GFR calc Af Amer: 8 mL/min — ABNORMAL LOW (ref >60)
GFR calc non Af Amer: 7 mL/min — ABNORMAL LOW (ref >60)
Glucose, Bld: 259 mg/dL — ABNORMAL HIGH (ref 70–99)
Potassium: 3.4 mmol/L — ABNORMAL LOW (ref 3.5–5.1)
Sodium: 138 mmol/L (ref 135–145)
Total Bilirubin: 0.7 mg/dL (ref 0.3–1.2)
Total Protein: 5.5 g/dL — ABNORMAL LOW (ref 6.5–8.1)

## 2020-08-14 LAB — CBC
HCT: 25.7 % — ABNORMAL LOW (ref 36.0–46.0)
HCT: 27.3 % — ABNORMAL LOW (ref 36.0–46.0)
Hemoglobin: 8.4 g/dL — ABNORMAL LOW (ref 12.0–15.0)
Hemoglobin: 8.8 g/dL — ABNORMAL LOW (ref 12.0–15.0)
MCH: 28.3 pg (ref 26.0–34.0)
MCH: 28.2 pg (ref 26.0–34.0)
MCHC: 32.7 g/dL (ref 30.0–36.0)
MCHC: 32.2 g/dL (ref 30.0–36.0)
MCV: 86.5 fL (ref 80.0–100.0)
MCV: 87.5 fL (ref 80.0–100.0)
Platelets: 299 10*3/uL (ref 150–400)
Platelets: 298 10*3/uL (ref 150–400)
RBC: 2.97 MIL/uL — ABNORMAL LOW (ref 3.87–5.11)
RBC: 3.12 MIL/uL — ABNORMAL LOW (ref 3.87–5.11)
RDW: 14.6 % (ref 11.5–15.5)
RDW: 14.7 % (ref 11.5–15.5)
WBC: 9.8 10*3/uL (ref 4.0–10.5)
WBC: 11.2 10*3/uL — ABNORMAL HIGH (ref 4.0–10.5)
nRBC: 0 % (ref 0.0–0.2)
nRBC: 0 % (ref 0.0–0.2)

## 2020-08-14 LAB — GLUCOSE, CAPILLARY
Glucose-Capillary: 349 mg/dL — ABNORMAL HIGH (ref 70–99)
Glucose-Capillary: 242 mg/dL — ABNORMAL HIGH (ref 70–99)
Glucose-Capillary: 259 mg/dL — ABNORMAL HIGH (ref 70–99)

## 2020-08-14 LAB — C-REACTIVE PROTEIN: CRP: 2.4 mg/dL — ABNORMAL HIGH (ref <1.0)

## 2020-08-14 LAB — D-DIMER, QUANTITATIVE: D-Dimer, Quant: 4.81 ug/mL-FEU — ABNORMAL HIGH (ref 0.00–0.50)

## 2020-08-14 LAB — FERRITIN: Ferritin: 729 ng/mL — ABNORMAL HIGH (ref 11–307)

## 2020-08-14 MED ORDER — INSULIN ASPART 100 UNIT/ML ~~LOC~~ SOLN
0.0000 [IU] | Freq: Three times a day (TID) | SUBCUTANEOUS | Status: DC
  Administered 2020-08-14: 3 [IU] via SUBCUTANEOUS
  Administered 2020-08-14: 2 [IU] via SUBCUTANEOUS

## 2020-08-14 MED ORDER — INSULIN ASPART 100 UNIT/ML ~~LOC~~ SOLN
0.0000 [IU] | Freq: Every day | SUBCUTANEOUS | Status: DC
  Administered 2020-08-14: 4 [IU] via SUBCUTANEOUS

## 2020-08-14 MED ORDER — TRAZODONE HCL 50 MG PO TABS: 200.0000 mg | ORAL_TABLET | Freq: Every evening | ORAL | Status: DC | PRN

## 2020-08-14 MED ORDER — HYDRALAZINE HCL 25 MG PO TABS
25.0000 mg | ORAL_TABLET | Freq: Three times a day (TID) | ORAL | Status: DC
  Administered 2020-08-14 (×2): 25 mg via ORAL
  Filled 2020-08-14 (×2): qty 1

## 2020-08-14 MED ORDER — DARBEPOETIN ALFA 100 MCG/0.5ML IJ SOSY
100.0000 ug | PREFILLED_SYRINGE | Freq: Once | INTRAMUSCULAR | Status: AC
  Administered 2020-08-14: 100 ug via SUBCUTANEOUS
  Filled 2020-08-14: qty 0.5

## 2020-08-14 MED ORDER — SODIUM BICARBONATE 650 MG PO TABS
1300.0000 mg | ORAL_TABLET | Freq: Two times a day (BID) | ORAL | Status: DC
  Administered 2020-08-14: 1300 mg via ORAL
  Filled 2020-08-14: qty 2

## 2020-08-14 MED ORDER — CARVEDILOL 25 MG PO TABS
25.0000 mg | ORAL_TABLET | Freq: Two times a day (BID) | ORAL | Status: DC
  Administered 2020-08-14: 25 mg via ORAL
  Filled 2020-08-14: qty 1

## 2020-08-14 MED ORDER — FUROSEMIDE 10 MG/ML IJ SOLN
120.0000 mg | Freq: Once | INTRAVENOUS | Status: AC
  Administered 2020-08-14: 120 mg via INTRAVENOUS
  Filled 2020-08-14: qty 2
  Filled 2020-08-14: qty 12

## 2020-08-14 MED ORDER — FUROSEMIDE 80 MG PO TABS: 80.0000 mg | ORAL_TABLET | Freq: Every day | ORAL | 0 refills | Status: DC

## 2020-08-14 MED ORDER — AMLODIPINE BESYLATE 10 MG PO TABS
10.0000 mg | ORAL_TABLET | Freq: Every day | ORAL | Status: DC
  Administered 2020-08-14: 10 mg via ORAL
  Filled 2020-08-14: qty 1

## 2020-08-14 NOTE — Discharge Summary (Signed)
PATIENT DETAILS Name: Veronica Simmons Age: 69 y.o. Sex: female Date of Birth: [birth year] MRN: [id number]. Admitting Physician: Flora Lipps, MD [id number], Jannifer Rodney, MD  Admit Date: 08/13/2020 Discharge date: 08/14/2020  Recommendations for Outpatient Follow-up:  1. Follow up with PCP in 1-2 weeks 2. Please obtain CMP/CBC in one week 3. Repeat Chest Xray in 4-6 week 4. Please ensure follow-up with patient's primary nephrologist in 1 week  Admitted From:  Home  Disposition: Home with home health [place]:  Yes  Equipment/Devices: None  Discharge Condition: Stable  CODE STATUS: FULL CODE  Diet recommendation:  Diet Order            Diet - low sodium heart healthy           Diet Carb Modified           Diet Carb Modified Fluid consistency: Thin; Room service appropriate? Yes  Diet effective now                  Brief Summary: See H&P, Labs, Consult and Test reports for all details in brief, patient is a 69 year old female with history of DM-2, HTN, chronic diastolic heart failure-who was admitted to this facility from 8/16-8/23 for acute hypoxic respiratory failure secondary to COVID-19 pneumonia and AKI.  She was discharged home in a stable manner on 8/23.  Post discharge she started feeling tired and fatigued-subsequently presented back to the emergency room where she was found to have a hemoglobin of 7.6.  She was admitted to the hospitalist service overnight for PRBC transfusion.  See below for further details.  Brief Hospital Course: Fatigue/weakness due to symptomatic anemia-and from recent acute illness from COVID-19.  Transfuse 1 unit of PRBC.  Feels much better after transfusion.  Per nursing staff-ambulating in the room to go to the bathroom without any major issues.  Resume home with services on discharge.  Symptomatic anemia: No evidence of hematochezia or melena.  Suspect anemia due to acute illness and AKI.  Discussed with  nephrologist-Dr. Barbra Sarks dose of Aranesp given.  Transfused 1 unit of PRBC post admission.  Hemoglobin stable at 8.8.  Nephrology will arrange quick outpatient follow-up next week.  Follow CBC closely.  AKI: Hemodynamically mediated acute tubular necrosis due to COVID-19-creatinine rapidly improving.  Patient will follow with Dr. Carolin Sicks in the outpatient setting.  She appears to be volume overloaded with 2-3+ pitting edema-we will give 1 dose of IV Lasix prior to discharge-subsequently will place on oral Lasix and have patient follow-up with Dr. Carolin Sicks in 1 week.  COVID-19 pneumonia: Chest x-ray on admission with improving infiltrates.  Not hypoxic.  Does not require oxygen.  Has completed a course of treatment during her last hospitalization.  Chronic diastolic heart failure relatively well compensated-does have some mild lower extremity edema for which she will be placed on Lasix.  HTN: Resume Coreg/amlodipine/hydralazine-have added Lasix.  Follow with PCP/primary nephrologist for further optimization.  DM-2: Due to AKI-all of her oral diabetic medications were discontinued last admission-continue sliding scale insulin as previous.  Once renal function improves-please resume oral hypoglycemics accordingly.  Deconditioning-/debility: Resume home health services that was ordered last admission.  Morbid obesity: Estimated body mass index is 37.09 kg/m as calculated from the following:   Height as of this encounter: 5\' 5"  (1.651 m).   Weight as of this encounter: 101.1 kg.   Discharge Diagnoses:  Principal Problem:   Symptomatic anemia Active Problems:   Type 2 diabetes,  controlled, with neuropathy ([place])   Hyperlipidemia   OBESITY, NOS   HYPERTENSION, BENIGN SYSTEMIC   AKI (acute kidney injury) ([place])   Generalized weakness   Discharge Instructions:    Person Under Monitoring Name: Veronica Simmons  Location: [place] [address]   Infection  Prevention Recommendations for Individuals Confirmed to have, or Being Evaluated for, 2019 Novel Coronavirus (COVID-19) Infection Who Receive Care at Home  Individuals who are confirmed to have, or are being evaluated for, COVID-19 should follow the prevention steps below until a healthcare provider or local or state health department says they can return to normal activities.  Stay home except to get medical care You should restrict activities outside your home, except for getting medical care. Do not go to work, school, or public areas, and do not use public transportation or taxis.  Call ahead before visiting your doctor Before your medical appointment, call the healthcare provider and tell them that you have, or are being evaluated for, COVID-19 infection. This will help the healthcare provider's office take steps to keep other people from getting infected. Ask your healthcare provider to call the local or state health department.  Monitor your symptoms Seek prompt medical attention if your illness is worsening (e.g., difficulty breathing). Before going to your medical appointment, call the healthcare provider and tell them that you have, or are being evaluated for, COVID-19 infection. Ask your healthcare provider to call the local or state health department.  Wear a facemask You should wear a facemask that covers your nose and mouth when you are in the same room with other people and when you visit a healthcare provider. People who live with or visit you should also wear a facemask while they are in the same room with you.  Separate yourself from other people in your home As much as possible, you should stay in a different room from other people in your home. Also, you should use a separate bathroom, if available.  Avoid sharing household items You should not share dishes, drinking glasses, cups, eating utensils, towels, bedding, or other items with other people in your home.  After using these items, you should wash them thoroughly with soap and water.  Cover your coughs and sneezes Cover your mouth and nose with a tissue when you cough or sneeze, or you can cough or sneeze into your sleeve. Throw used tissues in a lined trash can, and immediately wash your hands with soap and water for at least 20 seconds or use an alcohol-based hand rub.  Wash your [facility] your hands often and thoroughly with soap and water for at least 20 seconds. You can use an alcohol-based hand sanitizer if soap and water are not available and if your hands are not visibly dirty. Avoid touching your eyes, nose, and mouth with unwashed hands.   Prevention Steps for Caregivers and Household Members of Individuals Confirmed to have, or Being Evaluated for, COVID-19 Infection Being Cared for in the Home  If you live with, or provide care at home for, a person confirmed to have, or being evaluated for, COVID-19 infection please follow these guidelines to prevent infection:  Follow healthcare provider's instructions Make sure that you understand and can help the patient follow any healthcare provider instructions for all care.  Provide for the patient's basic needs You should help the patient with basic needs in the home and provide support for getting groceries, prescriptions, and other personal needs.  Monitor the patient's  symptoms If they are getting sicker, call his or her medical provider and tell them that the patient has, or is being evaluated for, COVID-19 infection. This will help the healthcare provider's office take steps to keep other people from getting infected. Ask the healthcare provider to call the local or state health department.  Limit the number of people who have contact with the patient  If possible, have only one caregiver for the patient.  Other household members should stay in another home or place of residence. If this is not possible, they should  stay  in another room, or be separated from the patient as much as possible. Use a separate bathroom, if available.  Restrict visitors who do not have an essential need to be in the home.  Keep older adults, very young children, and other sick people away from the patient Keep older adults, very young children, and those who have compromised immune systems or chronic health conditions away from the patient. This includes people with chronic heart, lung, or kidney conditions, diabetes, and cancer.  Ensure good ventilation Make sure that shared spaces in the home have good air flow, such as from an air conditioner or an opened window, weather permitting.  Wash your hands often  Wash your hands often and thoroughly with soap and water for at least 20 seconds. You can use an alcohol based hand sanitizer if soap and water are not available and if your hands are not visibly dirty.  Avoid touching your eyes, nose, and mouth with unwashed hands.  Use disposable paper towels to dry your hands. If not available, use dedicated cloth towels and replace them when they become wet.  Wear a facemask and gloves  Wear a disposable facemask at all times in the room and gloves when you touch or have contact with the patient's blood, body fluids, and/or secretions or excretions, such as sweat, saliva, sputum, nasal mucus, vomit, urine, or feces.  Ensure the mask fits over your nose and mouth tightly, and do not touch it during use.  Throw out disposable facemasks and gloves after using them. Do not reuse.  Wash your hands immediately after removing your facemask and gloves.  If your personal clothing becomes contaminated, carefully remove clothing and launder. Wash your hands after handling contaminated clothing.  Place all used disposable facemasks, gloves, and other waste in a lined container before disposing them with other household waste.  Remove gloves and wash your hands immediately after handling  these items.  Do not share dishes, glasses, or other household items with the patient  Avoid sharing household items. You should not share dishes, drinking glasses, cups, eating utensils, towels, bedding, or other items with a patient who is confirmed to have, or being evaluated for, COVID-19 infection.  After the person uses these items, you should wash them thoroughly with soap and water.  Wash laundry thoroughly  Immediately remove and wash clothes or bedding that have blood, body fluids, and/or secretions or excretions, such as sweat, saliva, sputum, nasal mucus, vomit, urine, or feces, on them.  Wear gloves when handling laundry from the patient.  Read and follow directions on labels of laundry or clothing items and detergent. In general, wash and dry with the warmest temperatures recommended on the label.  Clean all areas the individual has used often  Clean all touchable surfaces, such as counters, tabletops, doorknobs, bathroom fixtures, toilets, phones, keyboards, tablets, and bedside tables, every day. Also, clean any surfaces that may have blood,  body fluids, and/or secretions or excretions on them.  Wear gloves when cleaning surfaces the patient has come in contact with.  Use a diluted bleach solution (e.g., dilute bleach with 1 part bleach and 10 parts water) or a household disinfectant with a label that says EPA-registered for coronaviruses. To make a bleach solution at home, add 1 tablespoon of bleach to 1 quart (4 cups) of water. For a larger supply, add  cup of bleach to 1 gallon (16 cups) of water.  Read labels of cleaning products and follow recommendations provided on product labels. Labels contain instructions for safe and effective use of the cleaning product including precautions you should take when applying the product, such as wearing gloves or eye protection and making sure you have good ventilation during use of the product.  Remove gloves and wash hands  immediately after cleaning.  Monitor yourself for signs and symptoms of illness Caregivers and household members are considered close contacts, should monitor their health, and will be asked to limit movement outside of the home to the extent possible. Follow the monitoring steps for close contacts listed on the symptom monitoring form.   ? If you have additional questions, contact your local health department or call the epidemiologist on call at [phone] (available 24/7). ? This guidance is subject to change. For the most up-to-date guidance from CDC, please refer to their website: YouBlogs.pl    Activity:  As tolerated with Full fall precautions use walker/cane & assistance as needed  Discharge Instructions    Call MD for:  difficulty breathing, headache or visual disturbances   Complete by: As directed    Diet - low sodium heart healthy   Complete by: As directed    Diet Carb Modified   Complete by: As directed    Discharge instructions   Complete by: As directed    Follow with Primary MD  Katherina Mires, MD in 1-2 weeks  Follow with primary nephrologist-Dr. Carolin Sicks in 1 week  3 weeks of isolation from initial diagnosis of COVID-19  Please get a complete blood count and chemistry panel checked by your Primary MD at your next visit, and again as instructed by your Primary MD.  Get Medicines reviewed and adjusted: Please take all your medications with you for your next visit with your Primary MD  Laboratory/radiological data: Please request your Primary MD to go over all hospital tests and procedure/radiological results at the follow up, please ask your Primary MD to get all Hospital records sent to his/her office.  In some cases, they will be blood work, cultures and biopsy results pending at the time of your discharge. Please request that your primary care M.D. follows up on these results.  Also Note the  following: If you experience worsening of your admission symptoms, develop shortness of breath, life threatening emergency, suicidal or homicidal thoughts you must seek medical attention immediately by calling 911 or calling your MD immediately  if symptoms less severe.  You must read complete instructions/literature along with all the possible adverse reactions/side effects for all the Medicines you take and that have been prescribed to you. Take any new Medicines after you have completely understood and accpet all the possible adverse reactions/side effects.   Do not drive when taking Pain medications or sleeping medications (Benzodaizepines)  Do not take more than prescribed Pain, Sleep and Anxiety Medications. It is not advisable to combine anxiety,sleep and pain medications without talking with your primary care practitioner  Special Instructions: If  you have smoked or chewed Tobacco  in the last 2 yrs please stop smoking, stop any regular Alcohol  and or any Recreational drug use.  Wear Seat belts while driving.  Please note: You were cared for by a hospitalist during your hospital stay. Once you are discharged, your primary care physician will handle any further medical issues. Please note that NO REFILLS for any discharge medications will be authorized once you are discharged, as it is imperative that you return to your primary care physician (or establish a relationship with a primary care physician if you do not have one) for your post hospital discharge needs so that they can reassess your need for medications and monitor your lab values.   Increase activity slowly   Complete by: As directed      Allergies as of 08/14/2020      Reactions   Levofloxacin Hives      Medication List    TAKE these medications   amLODipine 10 MG tablet Commonly known as: NORVASC Take 1 tablet (10 mg total) by mouth daily.   carvedilol 25 MG tablet Commonly known as: COREG Take 25 mg by mouth 2  (two) times daily.   COQ10 PO Take 1 tablet by mouth daily.   ferrous sulfate 325 (65 FE) MG tablet Take 1 tablet by mouth daily.   furosemide 80 MG tablet Commonly known as: Lasix Take 1 tablet (80 mg total) by mouth daily.   glucose blood test strip Use as instructed What changed:   how much to take  how to take this  when to take this   glucose blood test strip Commonly known as: Accu-Chek Aviva Plus Use as instructed What changed:   how much to take  how to take this  when to take this   glucose blood test strip Commonly known as: Accu-Chek SmartView Test blood sugar once daily What changed:   how much to take  how to take this  when to take this   hydrALAZINE 25 MG tablet Commonly known as: APRESOLINE Take 1 tablet (25 mg total) by mouth every 8 (eight) hours.   insulin lispro 100 UNIT/ML KwikPen Commonly known as: HUMALOG Inject 0.01-0.09 mLs (1-9 Units total) into the skin 3 (three) times daily before meals. Blood sugars 70-120: no Insulin 121-150: 2 units 151-200: 3 units 201-250: 4 units 251-300: 5 units 301-350: 6 units 351-400: 9 units More than 400 use 9 units and call your doctor   onetouch ultrasoft lancets Use as instructed What changed:   how much to take  how to take this  when to take this   sodium bicarbonate 650 MG tablet Take 2 tablets (1,300 mg total) by mouth 2 (two) times daily.   traZODone 100 MG tablet Commonly known as: DESYREL Take 200 mg by mouth at bedtime as needed for sleep.       Follow-up Information    Katherina Mires, MD. Schedule an appointment as soon as possible for a visit in 1 week(s).   Specialty: Family Medicine Contact information: [address] [place] [address] [phone]        Rosita Fire, MD. Schedule an appointment as soon as possible for a visit in 1 week(s).   Specialties: Nephrology, Internal Medicine Contact information: [address] [place]  [address] [phone]              Allergies  Allergen Reactions  . Levofloxacin Hives  Other Procedures/Studies: US RENAL  Result Date: 08/03/2020 CLINICAL DATA:  Acute kidney injury, history of hypertension and diabetes EXAM: RENAL / URINARY TRACT ULTRASOUND COMPLETE COMPARISON:  CT abdomen pelvis 09/20/2016, ultrasound 08/20/2009 FINDINGS: Right Kidney: Renal measurements: 13.4 x 5.2 x 6.1 cm = volume: 220.1 mL. Increased renal cortical echogenicity. No concerning renal mass, shadowing calculus or hydronephrosis. Left Kidney: Renal measurements: 12.5 x 6.5 x 7 cm = volume: 298.2 mL. Increased renal cortical echogenicity. No concerning renal mass, shadowing calculus or hydronephrosis. Bladder: Appears normal for degree of bladder distention. Other: None. IMPRESSION: 1. Increased renal cortical echogenicity compatible with medical renal disease. 2. Otherwise unremarkable renal ultrasound. Electronically Signed   By: Lovena Le M.D.   On: 08/03/2020 03:41   DG Chest Port 1 View  Result Date: 08/13/2020 CLINICAL DATA:  Generalized weakness and edema for 4 days, lower extremity edema, COVID-19 positive diagnosed 08/02/2020 EXAM: PORTABLE CHEST 1 VIEW COMPARISON:  Portable exam 1229 hours compared to 08/02/2020 FINDINGS: Normal heart size, mediastinal contours, and pulmonary vascularity. Improved pulmonary infiltrates. No pleural effusion or pneumothorax. Bones demineralized. IMPRESSION: Improved BILATERAL pulmonary infiltrates. Electronically Signed   By: Lavonia Dana M.D.   On: 08/13/2020 12:40   DG Chest Port 1 View  Result Date: 08/03/2020 CLINICAL DATA:  Shortness of breath.  COVID positive 3 days ago. EXAM: PORTABLE CHEST 1 VIEW COMPARISON:  Radiograph 07/30/2020 FINDINGS: New patchy opacities in both mid and lower lung zones from prior exam. The right upper lobe opacity is persistent, slightly increased. Borderline cardiomegaly is unchanged. Unchanged mediastinal contours. Lower lung  volumes from prior. No pneumothorax or large pleural effusion. No acute osseous abnormalities are seen. IMPRESSION: Progression in bilateral airspace disease over the past 3 days with new opacities in both mid lower lung zones. Pattern typical of COVID-19 pneumonia. Electronically Signed   By: Keith Rake M.D.   On: 08/03/2020 00:00   DG Chest Portable 1 View  Result Date: 07/30/2020 CLINICAL DATA:  Chills and cough EXAM: PORTABLE CHEST 1 VIEW COMPARISON:  October 16, 2019 FINDINGS: The heart size and mediastinal contours are borderline enlarged. Patchy airspace opacity is seen within the right upper lung. The left lung is clear. No pleural effusion. Aortic knob calcifications are seen. The visualized skeletal structures are unremarkable. IMPRESSION: Hazy airspace opacity in the right upper lung which may be due to atelectasis and/or infectious etiology. Electronically Signed   By: Prudencio Pair M.D.   On: 07/30/2020 19:02     TODAY-DAY OF DISCHARGE:  Subjective:   Nile Dear today has no headache,no chest abdominal pain,no new weakness tingling or numbness, feels much better wants to go home today.   Objective:   Blood pressure (!) 166/68, pulse 98, temperature 98.4 F (36.9 C), temperature source Oral, resp. rate 13, height 5\' 5"  (1.651 m), weight 101.1 kg, SpO2 100 %.  Intake/Output Summary (Last 24 hours) at 08/14/2020 1210 Last data filed at 08/14/2020 0854 Gross per 24 hour  Intake 240 ml  Output 500 ml  Net -260 ml   Filed Weights   08/13/20 1202 08/14/20 0031 08/14/20 0425  Weight: 100.2 kg 101.7 kg 101.1 kg    Exam: Awake Alert, Oriented *3, No new F.N deficits, Normal affect [place].AT,PERRAL Supple Neck,No JVD, No cervical lymphadenopathy appriciated.  Symmetrical Chest wall movement, Good air movement bilaterally, CTAB RRR,No Gallops,Rubs or new Murmurs, No Parasternal Heave +ve B.Sounds, Abd Soft, Non tender, No organomegaly appriciated, No rebound -guarding or  rigidity. No Cyanosis, Clubbing or  edema, No new Rash or bruise   PERTINENT RADIOLOGIC STUDIES: US RENAL  Result Date: 08/03/2020 CLINICAL DATA:  Acute kidney injury, history of hypertension and diabetes EXAM: RENAL / URINARY TRACT ULTRASOUND COMPLETE COMPARISON:  CT abdomen pelvis 09/20/2016, ultrasound 08/20/2009 FINDINGS: Right Kidney: Renal measurements: 13.4 x 5.2 x 6.1 cm = volume: 220.1 mL. Increased renal cortical echogenicity. No concerning renal mass, shadowing calculus or hydronephrosis. Left Kidney: Renal measurements: 12.5 x 6.5 x 7 cm = volume: 298.2 mL. Increased renal cortical echogenicity. No concerning renal mass, shadowing calculus or hydronephrosis. Bladder: Appears normal for degree of bladder distention. Other: None. IMPRESSION: 1. Increased renal cortical echogenicity compatible with medical renal disease. 2. Otherwise unremarkable renal ultrasound. Electronically Signed   By: Lovena Le M.D.   On: 08/03/2020 03:41   DG Chest Port 1 View  Result Date: 08/13/2020 CLINICAL DATA:  Generalized weakness and edema for 4 days, lower extremity edema, COVID-19 positive diagnosed 08/02/2020 EXAM: PORTABLE CHEST 1 VIEW COMPARISON:  Portable exam 1229 hours compared to 08/02/2020 FINDINGS: Normal heart size, mediastinal contours, and pulmonary vascularity. Improved pulmonary infiltrates. No pleural effusion or pneumothorax. Bones demineralized. IMPRESSION: Improved BILATERAL pulmonary infiltrates. Electronically Signed   By: Lavonia Dana M.D.   On: 08/13/2020 12:40   DG Chest Port 1 View  Result Date: 08/03/2020 CLINICAL DATA:  Shortness of breath.  COVID positive 3 days ago. EXAM: PORTABLE CHEST 1 VIEW COMPARISON:  Radiograph 07/30/2020 FINDINGS: New patchy opacities in both mid and lower lung zones from prior exam. The right upper lobe opacity is persistent, slightly increased. Borderline cardiomegaly is unchanged. Unchanged mediastinal contours. Lower lung volumes from prior. No  pneumothorax or large pleural effusion. No acute osseous abnormalities are seen. IMPRESSION: Progression in bilateral airspace disease over the past 3 days with new opacities in both mid lower lung zones. Pattern typical of COVID-19 pneumonia. Electronically Signed   By: Keith Rake M.D.   On: 08/03/2020 00:00   DG Chest Portable 1 View  Result Date: 07/30/2020 CLINICAL DATA:  Chills and cough EXAM: PORTABLE CHEST 1 VIEW COMPARISON:  October 16, 2019 FINDINGS: The heart size and mediastinal contours are borderline enlarged. Patchy airspace opacity is seen within the right upper lung. The left lung is clear. No pleural effusion. Aortic knob calcifications are seen. The visualized skeletal structures are unremarkable. IMPRESSION: Hazy airspace opacity in the right upper lung which may be due to atelectasis and/or infectious etiology. Electronically Signed   By: Prudencio Pair M.D.   On: 07/30/2020 19:02     PERTINENT LAB RESULTS: CBC: Recent Labs    08/14/20 0156 08/14/20 0743  WBC 9.8 11.2*  HGB 8.4* 8.8*  HCT 25.7* 27.3*  PLT 299 298   CMET CMP     Component Value Date/Time   NA 138 08/14/2020 0743   K 3.4 (L) 08/14/2020 0743   CL 103 08/14/2020 0743   CO2 20 (L) 08/14/2020 0743   GLUCOSE 259 (H) 08/14/2020 0743   BUN 72 (H) 08/14/2020 0743   CREATININE 5.63 (H) 08/14/2020 0743   CREATININE 0.70 12/21/2014 1609   CALCIUM 8.0 (L) 08/14/2020 0743   PROT 5.5 (L) 08/14/2020 0743   ALBUMIN 1.8 (L) 08/14/2020 0743   AST 29 08/14/2020 0743   ALT 18 08/14/2020 0743   ALKPHOS 39 08/14/2020 0743   BILITOT 0.7 08/14/2020 0743   GFRNONAA 7 (L) 08/14/2020 0743   GFRAA 8 (L) 08/14/2020 0743    GFR Estimated Creatinine Clearance: 11.1 mL/min (A) (by  C-G formula based on SCr of 5.63 mg/dL (H)). No results for input(s): LIPASE, AMYLASE in the last 72 hours. No results for input(s): CKTOTAL, CKMB, CKMBINDEX, TROPONINI in the last 72 hours. Invalid input(s): POCBNP Recent Labs     08/14/20 0743  DDIMER 4.81*   No results for input(s): HGBA1C in the last 72 hours. No results for input(s): CHOL, HDL, LDLCALC, TRIG, CHOLHDL, LDLDIRECT in the last 72 hours. Recent Labs    08/13/20 1220  TSH 2.219   Recent Labs    08/13/20 1654 08/14/20 0743  VITAMINB12 1,229*  --   FOLATE 21.4  --   FERRITIN 830* 729*  TIBC 209*  --   IRON 48  --   RETICCTPCT 1.9  --    Coags: No results for input(s): INR in the last 72 hours.  Invalid input(s): PT Microbiology: No results found for this or any previous visit (from the past 240 hour(s)).  FURTHER DISCHARGE INSTRUCTIONS:  Get Medicines reviewed and adjusted: Please take all your medications with you for your next visit with your Primary MD  Laboratory/radiological data: Please request your Primary MD to go over all hospital tests and procedure/radiological results at the follow up, please ask your Primary MD to get all Hospital records sent to his/her office.  In some cases, they will be blood work, cultures and biopsy results pending at the time of your discharge. Please request that your primary care M.D. goes through all the records of your hospital data and follows up on these results.  Also Note the following: If you experience worsening of your admission symptoms, develop shortness of breath, life threatening emergency, suicidal or homicidal thoughts you must seek medical attention immediately by calling 911 or calling your MD immediately  if symptoms less severe.  You must read complete instructions/literature along with all the possible adverse reactions/side effects for all the Medicines you take and that have been prescribed to you. Take any new Medicines after you have completely understood and accpet all the possible adverse reactions/side effects.   Do not drive when taking Pain medications or sleeping medications (Benzodaizepines)  Do not take more than prescribed Pain, Sleep and Anxiety Medications. It  is not advisable to combine anxiety,sleep and pain medications without talking with your primary care practitioner  Special Instructions: If you have smoked or chewed Tobacco  in the last 2 yrs please stop smoking, stop any regular Alcohol  and or any Recreational drug use.  Wear Seat belts while driving.  Please note: You were cared for by a hospitalist during your hospital stay. Once you are discharged, your primary care physician will handle any further medical issues. Please note that NO REFILLS for any discharge medications will be authorized once you are discharged, as it is imperative that you return to your primary care physician (or establish a relationship with a primary care physician if you do not have one) for your post hospital discharge needs so that they can reassess your need for medications and monitor your lab values.  Total Time spent coordinating discharge including counseling, education and face to face time equals 35 minutes.  SignedOren Binet 08/14/2020 12:10 PM

## 2020-08-14 NOTE — Discharge Instructions (Signed)
Person Under Monitoring Name: Veronica Simmons  Location: [place] [address]   Infection Prevention Recommendations for Individuals Confirmed to have, or Being Evaluated for, 2019 Novel Coronavirus (COVID-19) Infection Who Receive Care at Home  Individuals who are confirmed to have, or are being evaluated for, COVID-19 should follow the prevention steps below until a healthcare provider or local or state health department says they can return to normal activities.  Stay home except to get medical care You should restrict activities outside your home, except for getting medical care. Do not go to work, school, or public areas, and do not use public transportation or taxis.  Call ahead before visiting your doctor Before your medical appointment, call the healthcare provider and tell them that you have, or are being evaluated for, COVID-19 infection. This will help the healthcare provider's office take steps to keep other people from getting infected. Ask your healthcare provider to call the local or state health department.  Monitor your symptoms Seek prompt medical attention if your illness is worsening (e.g., difficulty breathing). Before going to your medical appointment, call the healthcare provider and tell them that you have, or are being evaluated for, COVID-19 infection. Ask your healthcare provider to call the local or state health department.  Wear a facemask You should wear a facemask that covers your nose and mouth when you are in the same room with other people and when you visit a healthcare provider. People who live with or visit you should also wear a facemask while they are in the same room with you.  Separate yourself from other people in your home As much as possible, you should stay in a different room from other people in your home. Also, you should use a separate bathroom, if available.  Avoid sharing household items You should  not share dishes, drinking glasses, cups, eating utensils, towels, bedding, or other items with other people in your home. After using these items, you should wash them thoroughly with soap and water.  Cover your coughs and sneezes Cover your mouth and nose with a tissue when you cough or sneeze, or you can cough or sneeze into your sleeve. Throw used tissues in a lined trash can, and immediately wash your hands with soap and water for at least 20 seconds or use an alcohol-based hand rub.  Wash your [facility] your hands often and thoroughly with soap and water for at least 20 seconds. You can use an alcohol-based hand sanitizer if soap and water are not available and if your hands are not visibly dirty. Avoid touching your eyes, nose, and mouth with unwashed hands.   Prevention Steps for Caregivers and Household Members of Individuals Confirmed to have, or Being Evaluated for, COVID-19 Infection Being Cared for in the Home  If you live with, or provide care at home for, a person confirmed to have, or being evaluated for, COVID-19 infection please follow these guidelines to prevent infection:  Follow healthcare provider's instructions Make sure that you understand and can help the patient follow any healthcare provider instructions for all care.  Provide for the patient's basic needs You should help the patient with basic needs in the home and provide support for getting groceries, prescriptions, and other personal needs.  Monitor the patient's symptoms If they are getting sicker, call his or her medical provider and tell them that the patient has, or is being evaluated for, COVID-19 infection. This will help the healthcare provider's office  take steps to keep other people from getting infected. Ask the healthcare provider to call the local or state health department.  Limit the number of people who have contact with the patient  If possible, have only one caregiver for the  patient.  Other household members should stay in another home or place of residence. If this is not possible, they should stay  in another room, or be separated from the patient as much as possible. Use a separate bathroom, if available.  Restrict visitors who do not have an essential need to be in the home.  Keep older adults, very young children, and other sick people away from the patient Keep older adults, very young children, and those who have compromised immune systems or chronic health conditions away from the patient. This includes people with chronic heart, lung, or kidney conditions, diabetes, and cancer.  Ensure good ventilation Make sure that shared spaces in the home have good air flow, such as from an air conditioner or an opened window, weather permitting.  Wash your hands often  Wash your hands often and thoroughly with soap and water for at least 20 seconds. You can use an alcohol based hand sanitizer if soap and water are not available and if your hands are not visibly dirty.  Avoid touching your eyes, nose, and mouth with unwashed hands.  Use disposable paper towels to dry your hands. If not available, use dedicated cloth towels and replace them when they become wet.  Wear a facemask and gloves  Wear a disposable facemask at all times in the room and gloves when you touch or have contact with the patient's blood, body fluids, and/or secretions or excretions, such as sweat, saliva, sputum, nasal mucus, vomit, urine, or feces.  Ensure the mask fits over your nose and mouth tightly, and do not touch it during use.  Throw out disposable facemasks and gloves after using them. Do not reuse.  Wash your hands immediately after removing your facemask and gloves.  If your personal clothing becomes contaminated, carefully remove clothing and launder. Wash your hands after handling contaminated clothing.  Place all used disposable facemasks, gloves, and other waste in a lined  container before disposing them with other household waste.  Remove gloves and wash your hands immediately after handling these items.  Do not share dishes, glasses, or other household items with the patient  Avoid sharing household items. You should not share dishes, drinking glasses, cups, eating utensils, towels, bedding, or other items with a patient who is confirmed to have, or being evaluated for, COVID-19 infection.  After the person uses these items, you should wash them thoroughly with soap and water.  Wash laundry thoroughly  Immediately remove and wash clothes or bedding that have blood, body fluids, and/or secretions or excretions, such as sweat, saliva, sputum, nasal mucus, vomit, urine, or feces, on them.  Wear gloves when handling laundry from the patient.  Read and follow directions on labels of laundry or clothing items and detergent. In general, wash and dry with the warmest temperatures recommended on the label.  Clean all areas the individual has used often  Clean all touchable surfaces, such as counters, tabletops, doorknobs, bathroom fixtures, toilets, phones, keyboards, tablets, and bedside tables, every day. Also, clean any surfaces that may have blood, body fluids, and/or secretions or excretions on them.  Wear gloves when cleaning surfaces the patient has come in contact with.  Use a diluted bleach solution (e.g., dilute bleach with 1 part  bleach and 10 parts water) or a household disinfectant with a label that says EPA-registered for coronaviruses. To make a bleach solution at home, add 1 tablespoon of bleach to 1 quart (4 cups) of water. For a larger supply, add  cup of bleach to 1 gallon (16 cups) of water.  Read labels of cleaning products and follow recommendations provided on product labels. Labels contain instructions for safe and effective use of the cleaning product including precautions you should take when applying the product, such as wearing gloves or  eye protection and making sure you have good ventilation during use of the product.  Remove gloves and wash hands immediately after cleaning.  Monitor yourself for signs and symptoms of illness Caregivers and household members are considered close contacts, should monitor their health, and will be asked to limit movement outside of the home to the extent possible. Follow the monitoring steps for close contacts listed on the symptom monitoring form.   ? If you have additional questions, contact your local health department or call the epidemiologist on call at [phone] (available 24/7). ? This guidance is subject to change. For the most up-to-date guidance from [facility], please refer to their website: [email or web address]

## 2020-08-14 NOTE — TOC Transition Note (Signed)
Transition of Care [facility]) - CM/SW Discharge Note   Patient Details  Name: Veronica Simmons MRN: [id number] Date of Birth: 11-Dec-1951  [facility]) CM/SW Contact:  Carles Collet, RN Phone Number: 08/14/2020, 2:32 PM   Clinical Narrative:   Spoke w patient, agreeable to [facility] services with any provider. Referral accepted by [facility]          Patient Goals and CMS Choice   CMS Medicare.gov Compare Post Acute Care list provided to:: Patient Choice offered to / list presented to : Patient  Discharge Placement                       Discharge Plan and Services                          HH Arranged: PT, OT [facility] Agency: [place] Date [facility] Agency Contacted: 08/14/20 Time [place]: 5929 Representative spoke with at [place]: [place] ([place]) Interventions     Readmission Risk Interventions Readmission Risk Prevention Plan 08/06/2020  Transportation Screening Complete  PCP or Specialist Appt within 3-5 Days Complete  HRI or [place] Complete  Social Work Consult for [place] Planning/Counseling Complete  Palliative Care Screening Not Applicable  Medication Review [employer]) Referral to Pharmacy  Some recent data might be hidden

## 2020-08-14 NOTE — Progress Notes (Signed)
Pt given discharge instructions, prescriptions, and care notes. Pt verbalized understanding AEB no further questions or concerns at this time. IV was discontinued, no redness, pain, or swelling noted at this time. Telemetry discontinued and Centralized Telemetry was notified. Pt left the floor via wheelchair with staff in stable condition. 

## 2020-08-15 LAB — TYPE AND SCREEN
ABO/RH(D): A POS
Antibody Screen: NEGATIVE
PT AG Type: POSITIVE
Unit division: 0
Unit division: 0

## 2020-08-15 LAB — BPAM RBC
Blood Product Expiration Date: 202109242359
Blood Product Expiration Date: 202109242359
ISSUE DATE / TIME: 202108271923
Unit Type and Rh: 6200
Unit Type and Rh: 6200

## 2020-08-16 MED FILL — Methylprednisolone Sod Succ For Inj 125 MG (Base Equiv): INTRAMUSCULAR | Qty: 0.8 | Status: AC

## 2020-08-16 MED FILL — Cholecalciferol Tab 25 MCG (1000 Unit): ORAL | Qty: 1000 | Status: AC

## 2020-08-16 MED FILL — Zinc Sulfate Cap 220 MG (50 MG Elemental Zn): ORAL | Qty: 220 | Status: AC

## 2020-08-16 MED FILL — Amlodipine Besylate Tab 10 MG (Base Equivalent): ORAL | Qty: 10 | Status: AC

## 2020-09-21 ENCOUNTER — Other Ambulatory Visit (HOSPITAL_COMMUNITY): Payer: Self-pay | Admitting: *Deleted

## 2020-09-21 NOTE — Discharge Instructions (Signed)
  Epoetin Alfa injection What is this medicine? EPOETIN ALFA (e POE e tin AL fa) helps your body make more red blood cells. This medicine is used to treat anemia caused by chronic kidney disease, cancer chemotherapy, or HIV-therapy. It may also be used before surgery if you have anemia. This medicine may be used for other purposes; ask your health care provider or pharmacist if you have questions. COMMON BRAND NAME(S): Epogen, Procrit, Retacrit What should I tell my health care provider before I take this medicine? They need to know if you have any of these conditions:  cancer  heart disease  high blood pressure  history of blood clots  history of stroke  low levels of folate, iron, or vitamin B12 in the blood  seizures  an unusual or allergic reaction to erythropoietin, albumin, benzyl alcohol, hamster proteins, other medicines, foods, dyes, or preservatives  pregnant or trying to get pregnant  breast-feeding How should I use this medicine? This medicine is for injection into a vein or under the skin. It is usually given by a health care professional in a hospital or clinic setting. If you get this medicine at home, you will be taught how to prepare and give this medicine. Use exactly as directed. Take your medicine at regular intervals. Do not take your medicine more often than directed. It is important that you put your used needles and syringes in a special sharps container. Do not put them in a trash can. If you do not have a sharps container, call your pharmacist or healthcare provider to get one. A special MedGuide will be given to you by the pharmacist with each prescription and refill. Be sure to read this information carefully each time. Talk to your pediatrician regarding the use of this medicine in children. While this drug may be prescribed for selected conditions, precautions do apply. Overdosage: If you think you have taken too much of this medicine contact a  poison control center or emergency room at once. NOTE: This medicine is only for you. Do not share this medicine with others. What if I miss a dose? If you miss a dose, take it as soon as you can. If it is almost time for your next dose, take only that dose. Do not take double or extra doses. What may interact with this medicine? Interactions have not been studied. This list may not describe all possible interactions. Give your health care provider a list of all the medicines, herbs, non-prescription drugs, or dietary supplements you use. Also tell them if you smoke, drink alcohol, or use illegal drugs. Some items may interact with your medicine. What should I watch for while using this medicine? Your condition will be monitored carefully while you are receiving this medicine. You may need blood work done while you are taking this medicine. This medicine may cause a decrease in vitamin B6. You should make sure that you get enough vitamin B6 while you are taking this medicine. Discuss the foods you eat and the vitamins you take with your health care professional. What side effects may I notice from receiving this medicine? Side effects that you should report to your doctor or health care professional as soon as possible:  allergic reactions like skin rash, itching or hives, swelling of the face, lips, or tongue  seizures  signs and symptoms of a blood clot such as breathing problems; changes in vision; chest pain; severe, sudden headache; pain, swelling, warmth in the leg; trouble speaking; sudden   numbness or weakness of the face, arm or leg  signs and symptoms of a stroke like changes in vision; confusion; trouble speaking or understanding; severe headaches; sudden numbness or weakness of the face, arm or leg; trouble walking; dizziness; loss of balance or coordination Side effects that usually do not require medical attention (report to your doctor or health care professional if they continue  or are bothersome):  chills  cough  dizziness  fever  headaches  joint pain  muscle cramps  muscle pain  nausea, vomiting  pain, redness, or irritation at site where injected This list may not describe all possible side effects. Call your doctor for medical advice about side effects. You may report side effects to FDA at 1-800-FDA-1088. Where should I keep my medicine? Keep out of the reach of children. Store in a refrigerator between 2 and 8 degrees C (36 and 46 degrees F). Do not freeze or shake. Throw away any unused portion if using a single-dose vial. Multi-dose vials can be kept in the refrigerator for up to 21 days after the initial dose. Throw away unused medicine. NOTE: This sheet is a summary. It may not cover all possible information. If you have questions about this medicine, talk to your doctor, pharmacist, or health care provider.  2020 Elsevier/Gold Standard (2017-07-13 08:35:19) Ferumoxytol injection What is this medicine? FERUMOXYTOL is an iron complex. Iron is used to make healthy red blood cells, which carry oxygen and nutrients throughout the body. This medicine is used to treat iron deficiency anemia. This medicine may be used for other purposes; ask your health care provider or pharmacist if you have questions. COMMON BRAND NAME(S): Feraheme What should I tell my health care provider before I take this medicine? They need to know if you have any of these conditions:  anemia not caused by low iron levels  high levels of iron in the blood  magnetic resonance imaging (MRI) test scheduled  an unusual or allergic reaction to iron, other medicines, foods, dyes, or preservatives  pregnant or trying to get pregnant  breast-feeding How should I use this medicine? This medicine is for injection into a vein. It is given by a health care professional in a hospital or clinic setting. Talk to your pediatrician regarding the use of this medicine in children.  Special care may be needed. Overdosage: If you think you have taken too much of this medicine contact a poison control center or emergency room at once. NOTE: This medicine is only for you. Do not share this medicine with others. What if I miss a dose? It is important not to miss your dose. Call your doctor or health care professional if you are unable to keep an appointment. What may interact with this medicine? This medicine may interact with the following medications:  other iron products This list may not describe all possible interactions. Give your health care provider a list of all the medicines, herbs, non-prescription drugs, or dietary supplements you use. Also tell them if you smoke, drink alcohol, or use illegal drugs. Some items may interact with your medicine. What should I watch for while using this medicine? Visit your doctor or healthcare professional regularly. Tell your doctor or healthcare professional if your symptoms do not start to get better or if they get worse. You may need blood work done while you are taking this medicine. You may need to follow a special diet. Talk to your doctor. Foods that contain iron include: whole grains/cereals, dried fruits,   beans, or peas, leafy green vegetables, and organ meats (liver, kidney). What side effects may I notice from receiving this medicine? Side effects that you should report to your doctor or health care professional as soon as possible:  allergic reactions like skin rash, itching or hives, swelling of the face, lips, or tongue  breathing problems  changes in blood pressure  feeling faint or lightheaded, falls  fever or chills  flushing, sweating, or hot feelings  swelling of the ankles or feet Side effects that usually do not require medical attention (report to your doctor or health care professional if they continue or are bothersome):  diarrhea  headache  nausea, vomiting  stomach pain This list may not  describe all possible side effects. Call your doctor for medical advice about side effects. You may report side effects to FDA at 1-800-FDA-1088. Where should I keep my medicine? This drug is given in a hospital or clinic and will not be stored at home. NOTE: This sheet is a summary. It may not cover all possible information. If you have questions about this medicine, talk to your doctor, pharmacist, or health care provider.  2020 Elsevier/Gold Standard (2017-01-22 20:21:10)  

## 2020-09-22 ENCOUNTER — Encounter (HOSPITAL_COMMUNITY)
Admission: RE | Admit: 2020-09-22 | Discharge: 2020-09-22 | Disposition: A | Discharge status: Home / Self Care | Payer: Medicare Other | Source: Ambulatory Visit | Attending: Nephrology | Admitting: Nephrology

## 2020-09-22 ENCOUNTER — Other Ambulatory Visit: Payer: Self-pay

## 2020-09-22 VITALS — BP 134/64 | HR 77 | Temp 98.3°F | Resp 18

## 2020-09-22 DIAGNOSIS — N183 Chronic kidney disease, stage 3 unspecified: Secondary | ICD-10-CM | POA: Diagnosis present

## 2020-09-22 DIAGNOSIS — D631 Anemia in chronic kidney disease: Secondary | ICD-10-CM | POA: Diagnosis present

## 2020-09-22 LAB — POCT HEMOGLOBIN-HEMACUE: Hemoglobin: 8.3 g/dL — ABNORMAL LOW (ref 12.0–15.0)

## 2020-09-22 MED ORDER — EPOETIN ALFA-EPBX 2000 UNIT/ML IJ SOLN
INTRAMUSCULAR | Status: AC
  Administered 2020-09-22: 2000 [IU]
  Filled 2020-09-22: qty 1

## 2020-09-22 MED ORDER — SODIUM CHLORIDE 0.9 % IV SOLN
510.0000 mg | Freq: Once | INTRAVENOUS | Status: AC
  Administered 2020-09-22: 510 mg via INTRAVENOUS
  Filled 2020-09-22: qty 17

## 2020-09-22 MED ORDER — EPOETIN ALFA-EPBX 3000 UNIT/ML IJ SOLN
INTRAMUSCULAR | Status: AC
  Administered 2020-09-22: 3000 [IU]
  Filled 2020-09-22: qty 1

## 2020-09-22 MED ORDER — EPOETIN ALFA-EPBX 10000 UNIT/ML IJ SOLN: 15000.0000 [IU] | INTRAMUSCULAR | Status: DC

## 2020-09-22 MED ORDER — EPOETIN ALFA-EPBX 10000 UNIT/ML IJ SOLN
INTRAMUSCULAR | Status: AC
  Administered 2020-09-22: 10000 [IU]
  Filled 2020-09-22: qty 1

## 2020-10-06 ENCOUNTER — Encounter (HOSPITAL_COMMUNITY): Payer: Medicare Other

## 2020-10-20 ENCOUNTER — Encounter (HOSPITAL_COMMUNITY)
Admission: RE | Admit: 2020-10-20 | Discharge: 2020-10-20 | Disposition: A | Discharge status: Home / Self Care | Payer: Medicare Other | Source: Ambulatory Visit | Attending: Nephrology | Admitting: Nephrology

## 2020-10-20 ENCOUNTER — Other Ambulatory Visit: Payer: Self-pay

## 2020-10-20 VITALS — BP 170/61 | HR 86 | Temp 97.6°F | Resp 18

## 2020-10-20 DIAGNOSIS — N183 Chronic kidney disease, stage 3 unspecified: Secondary | ICD-10-CM | POA: Insufficient documentation

## 2020-10-20 DIAGNOSIS — D631 Anemia in chronic kidney disease: Secondary | ICD-10-CM | POA: Diagnosis present

## 2020-10-20 LAB — IRON AND TIBC
Iron: 80 ug/dL (ref 28–170)
Saturation Ratios: 24 % (ref 10.4–31.8)
TIBC: 339 ug/dL (ref 250–450)
UIBC: 259 ug/dL

## 2020-10-20 LAB — POCT HEMOGLOBIN-HEMACUE: Hemoglobin: 10.3 g/dL — ABNORMAL LOW (ref 12.0–15.0)

## 2020-10-20 LAB — FERRITIN: Ferritin: 478 ng/mL — ABNORMAL HIGH (ref 11–307)

## 2020-10-20 MED ORDER — EPOETIN ALFA-EPBX 2000 UNIT/ML IJ SOLN
INTRAMUSCULAR | Status: AC
  Filled 2020-10-20: qty 1

## 2020-10-20 MED ORDER — EPOETIN ALFA-EPBX 10000 UNIT/ML IJ SOLN: 15000.0000 [IU] | INTRAMUSCULAR | Status: DC

## 2020-10-20 MED ORDER — EPOETIN ALFA-EPBX 10000 UNIT/ML IJ SOLN
INTRAMUSCULAR | Status: AC
  Administered 2020-10-20: 15000 [IU] via SUBCUTANEOUS
  Filled 2020-10-20: qty 1

## 2020-10-20 MED ORDER — EPOETIN ALFA-EPBX 3000 UNIT/ML IJ SOLN
INTRAMUSCULAR | Status: AC
  Filled 2020-10-20: qty 1

## 2020-11-01 ENCOUNTER — Emergency Department (HOSPITAL_COMMUNITY)
Admission: EM | Admit: 2020-11-01 | Discharge: 2020-11-01 | Disposition: A | Discharge status: Home / Self Care | Payer: Medicare Other | Attending: Emergency Medicine | Admitting: Emergency Medicine

## 2020-11-01 ENCOUNTER — Encounter (HOSPITAL_COMMUNITY): Payer: Self-pay

## 2020-11-01 ENCOUNTER — Other Ambulatory Visit: Payer: Self-pay

## 2020-11-01 DIAGNOSIS — Z8616 Personal history of COVID-19: Secondary | ICD-10-CM | POA: Diagnosis not present

## 2020-11-01 DIAGNOSIS — I10 Essential (primary) hypertension: Secondary | ICD-10-CM

## 2020-11-01 DIAGNOSIS — Z79899 Other long term (current) drug therapy: Secondary | ICD-10-CM | POA: Diagnosis not present

## 2020-11-01 DIAGNOSIS — I129 Hypertensive chronic kidney disease with stage 1 through stage 4 chronic kidney disease, or unspecified chronic kidney disease: Secondary | ICD-10-CM | POA: Diagnosis present

## 2020-11-01 DIAGNOSIS — E1140 Type 2 diabetes mellitus with diabetic neuropathy, unspecified: Secondary | ICD-10-CM | POA: Diagnosis not present

## 2020-11-01 DIAGNOSIS — E1122 Type 2 diabetes mellitus with diabetic chronic kidney disease: Secondary | ICD-10-CM | POA: Insufficient documentation

## 2020-11-01 DIAGNOSIS — N189 Chronic kidney disease, unspecified: Secondary | ICD-10-CM

## 2020-11-01 DIAGNOSIS — Z794 Long term (current) use of insulin: Secondary | ICD-10-CM | POA: Insufficient documentation

## 2020-11-01 LAB — CBC WITH DIFFERENTIAL/PLATELET
Abs Immature Granulocytes: 0.02 10*3/uL (ref 0.00–0.07)
Basophils Absolute: 0 10*3/uL (ref 0.0–0.1)
Basophils Relative: 0 %
Eosinophils Absolute: 0.3 10*3/uL (ref 0.0–0.5)
Eosinophils Relative: 5 %
HCT: 34.9 % — ABNORMAL LOW (ref 36.0–46.0)
Hemoglobin: 10.6 g/dL — ABNORMAL LOW (ref 12.0–15.0)
Immature Granulocytes: 0 %
Lymphocytes Relative: 24 %
Lymphs Abs: 1.4 10*3/uL (ref 0.7–4.0)
MCH: 28.6 pg (ref 26.0–34.0)
MCHC: 30.4 g/dL (ref 30.0–36.0)
MCV: 94.1 fL (ref 80.0–100.0)
Monocytes Absolute: 0.3 10*3/uL (ref 0.1–1.0)
Monocytes Relative: 6 %
Neutro Abs: 3.7 10*3/uL (ref 1.7–7.7)
Neutrophils Relative %: 65 %
Platelets: 328 10*3/uL (ref 150–400)
RBC: 3.71 MIL/uL — ABNORMAL LOW (ref 3.87–5.11)
RDW: 13.6 % (ref 11.5–15.5)
WBC: 5.7 10*3/uL (ref 4.0–10.5)
nRBC: 0 % (ref 0.0–0.2)

## 2020-11-01 LAB — BASIC METABOLIC PANEL
Anion gap: 11 (ref 5–15)
BUN: 30 mg/dL — ABNORMAL HIGH (ref 8–23)
CO2: 28 mmol/L (ref 22–32)
Calcium: 9.8 mg/dL (ref 8.9–10.3)
Chloride: 99 mmol/L (ref 98–111)
Creatinine, Ser: 2.44 mg/dL — ABNORMAL HIGH (ref 0.44–1.00)
GFR, Estimated: 21 mL/min — ABNORMAL LOW (ref >60)
Glucose, Bld: 100 mg/dL — ABNORMAL HIGH (ref 70–99)
Potassium: 3.8 mmol/L (ref 3.5–5.1)
Sodium: 138 mmol/L (ref 135–145)

## 2020-11-01 NOTE — ED Triage Notes (Signed)
Pt arrived via walk in, c/o uncontrolled HTN. Pt has hx of same, has seen her PCP multiple times for medication adjustments. Pt states there has been no improvement with changes and she does not know what else to do. Denies any other issues at this time.

## 2020-11-01 NOTE — Discharge Instructions (Addendum)
Please return for any problem.    Follow-up with your regular primary care provider and with [facility] tomorrow as instructed.

## 2020-11-01 NOTE — ED Provider Notes (Signed)
[place] DEPT Provider Note   CSN: [id number] Arrival date & time: 11/01/20  1146     History Chief Complaint  Patient presents with  . Hypertension    Veronica Simmons is a 69 y.o. female.  69 year old female with prior medical history as detailed below presents for evaluation.  Patient reports onset history of hypertension.  Patient reports being seen by her primary care provider on Friday of last week.  She was restarted on one of her antihypertensives.  She reports that she was told to follow-up this Friday for blood pressure check.  Over the weekend she noticed blood pressures in the 427C -623J systolic.  She denies associated chest pain, shortness of breath, nausea, vomiting, visual change, weakness or other acute complaint.  Patient is concerned that her blood pressure is not improving significantly over the last several days.  Patient is requesting blood work evaluation to check her creatinine.  Patient with reported admission in August 2021 for Covid.  Patient's creatinine at that time was in the 5-6 range.  Patient reports that she did follow-up with [facility] as an outpatient.  Her creatinine has improved since last admission.  She cannot provide me with recent creatinine.    The history is provided by the patient and medical records.  Hypertension This is a chronic problem. The current episode started more than 1 week ago. The problem occurs constantly. The problem has not changed since onset.Pertinent negatives include no chest pain, no abdominal pain, no headaches and no shortness of breath. Nothing aggravates the symptoms. Nothing relieves the symptoms.       Past Medical History:  Diagnosis Date  . ALLERGIC RHINITIS 02/19/2007   Qualifier: Diagnosis of  By: Lorne Skeens MD, VALENICA    . Allergy    seasonal  . Anemia 1970s   on iron   . Arthritis   . Cubital tunnel syndrome on right 2020   Secondary to old  fracture. Evaluated by ortho. [facility] offered surgery but patient refused. Numbness and burning pain 4th and 5th digit.    . Diabetes mellitus 2000   Never on insulin   . High cholesterol   . Hypertension 2000    Patient Active Problem List   Diagnosis Date Noted  . Symptomatic anemia 08/13/2020  . Generalized weakness 08/13/2020  . Pneumonia due to COVID-19 virus 08/03/2020  . Acute respiratory failure with hypoxemia ([place]) 08/03/2020  . AKI (acute kidney injury) ([place]) 08/03/2020  . Hyponatremia 08/03/2020  . New onset of congestive heart failure ([place]) 10/17/2019  . Hypertensive urgency 10/17/2019  . Right sided weakness 03/07/2018  . HTN (hypertension) 03/07/2018  . Obesity (BMI 30-39.9) 03/07/2018  . Skin papules, generalized 08/04/2016  . Skin lesion of right lower limb 06/19/2016  . Leg swelling 05/12/2016  . Subconjunctival hemorrhage of right eye 04/27/2016  . Diarrhea 01/09/2016  . Health care maintenance 06/28/2015  . Anemia 12/21/2014  . Palpitations 01/07/2014  . Recurrent yeast vaginitis 11/05/2013  . Recurrent genital herpes 07/17/2013  . Hot flashes not due to menopause 11/27/2012  . Cubital tunnel syndrome on right 10/31/2012  . Type 2 diabetes, controlled, with neuropathy ([place]) 02/14/2007  . Hyperlipidemia 02/14/2007  . OBESITY, NOS 02/14/2007  . HYPERTENSION, BENIGN SYSTEMIC 02/14/2007  . ARTHRITIS 02/14/2007  . Insomnia 02/14/2007    Past Surgical History:  Procedure Laterality Date  . DG HAND RIGHT COMPLETE ([place] HX) Right 2020   right hand/wrist/elbow surgery to fix carpel tunnel/pinched nerve  OB History   No obstetric history on file.     Family History  Problem Relation Age of Onset  . Early death Mother 79       shot   . Heart disease Father 45  . Diabetes Daughter   . Kidney disease Daughter        on dialysis   . Colon polyps Daughter   . Diabetes Maternal Aunt   . Cancer Paternal Uncle        Breast Cancer   . Breast  cancer Maternal Grandmother   . Colon cancer Neg Hx   . Esophageal cancer Neg Hx   . Stomach cancer Neg Hx   . Rectal cancer Neg Hx     Social History   Tobacco Use  . Smoking status: Never Smoker  . Smokeless tobacco: Never Used  Vaping Use  . Vaping Use: Never used  Substance Use Topics  . Alcohol use: No  . Drug use: No    Home Medications Prior to Admission medications   Medication Sig Start Date End Date Taking? Authorizing Provider  amLODipine (NORVASC) 10 MG tablet Take 1 tablet (10 mg total) by mouth daily. 10/18/19   Dana Allan I, MD  carvedilol (COREG) 25 MG tablet Take 25 mg by mouth 2 (two) times daily. 07/20/20   [provider]  Coenzyme Q10 (COQ10 PO) Take 1 tablet by mouth daily.    [provider]  ferrous sulfate 325 (65 FE) MG tablet Take 1 tablet by mouth daily.    [provider]  furosemide (LASIX) 80 MG tablet Take 1 tablet (80 mg total) by mouth daily. 08/14/20 08/14/21  Ghimire, Henreitta Leber, MD  glucose blood (ACCU-CHEK AVIVA PLUS) test strip Use as instructed Patient taking differently: 1 each by Other route as directed. Use as instructed 01/06/16   Haney, Yetta Flock A, MD  glucose blood (ACCU-CHEK SMARTVIEW) test strip Test blood sugar once daily Patient taking differently: 1 each by Other route See admin instructions. Test blood sugar once daily 10/12/16   Haney, Alyssa A, MD  glucose blood test strip Use as instructed Patient taking differently: 1 each by Other route as directed. Use as instructed 12/29/15   Marina Goodell A, MD  hydrALAZINE (APRESOLINE) 25 MG tablet Take 1 tablet (25 mg total) by mouth every 8 (eight) hours. 08/09/20 09/08/20  Barb Merino, MD  insulin lispro (HUMALOG) 100 UNIT/ML KwikPen Inject 0.01-0.09 mLs (1-9 Units total) into the skin 3 (three) times daily before meals. Blood sugars 70-120: no Insulin 121-150: 2 units 151-200: 3 units 201-250: 4 units 251-300: 5 units 301-350: 6 units 351-400: 9 units More  than 400 use 9 units and call your doctor 08/09/20   Barb Merino, MD  Lancets [facility] ULTRASOFT) lancets Use as instructed Patient taking differently: 1 each by Other route as directed. Use as instructed 08/27/15   Marina Goodell A, MD  traZODone (DESYREL) 100 MG tablet Take 200 mg by mouth at bedtime as needed for sleep.  03/18/20   [provider]    Allergies    Levofloxacin  Review of Systems   Review of Systems  Respiratory: Negative for shortness of breath.   Cardiovascular: Negative for chest pain.  Gastrointestinal: Negative for abdominal pain.  Neurological: Negative for headaches.  All other systems reviewed and are negative.   Physical Exam Updated Vital Signs BP (!) 180/79   Pulse 70   Temp 97.9 F (36.6 C) (Oral)   Resp 16  SpO2 100%   Physical Exam Vitals and nursing note reviewed.  Constitutional:      General: She is not in acute distress.    Appearance: Normal appearance. She is well-developed.  HENT:     Head: Normocephalic and atraumatic.  Eyes:     Conjunctiva/sclera: Conjunctivae normal.     Pupils: Pupils are equal, round, and reactive to light.  Cardiovascular:     Rate and Rhythm: Normal rate and regular rhythm.     Heart sounds: Normal heart sounds.  Pulmonary:     Effort: Pulmonary effort is normal. No respiratory distress.     Breath sounds: Normal breath sounds.  Abdominal:     General: There is no distension.     Palpations: Abdomen is soft.     Tenderness: There is no abdominal tenderness.  Musculoskeletal:        General: No deformity. Normal range of motion.     Cervical back: Normal range of motion and neck supple.  Skin:    General: Skin is warm and dry.  Neurological:     General: No focal deficit present.     Mental Status: She is alert and oriented to person, place, and time.     ED Results / Procedures / Treatments   Labs (all labs ordered are listed, but only abnormal results are displayed) Labs Reviewed   BASIC METABOLIC PANEL - Abnormal; Notable for the following components:      Result Value   Glucose, Bld 100 (*)    BUN 30 (*)    Creatinine, Ser 2.44 (*)    GFR, Estimated 21 (*)    All other components within normal limits  CBC WITH DIFFERENTIAL/PLATELET - Abnormal; Notable for the following components:   RBC 3.71 (*)    Hemoglobin 10.6 (*)    HCT 34.9 (*)    All other components within normal limits    EKG None  Radiology No results found.  Procedures Procedures (including critical care time)  Medications Ordered in ED Medications - No data to display  ED Course  I have reviewed the triage vital signs and the nursing notes.  Pertinent labs & imaging results that were available during my care of the patient were reviewed by me and considered in my medical decision making (see chart for details).    MDM Rules/Calculators/A&P                          MDM  Screen complete  Veronica Simmons was evaluated in Emergency Department on 11/01/2020 for the symptoms described in the history of present illness. She was evaluated in the context of the global COVID-19 pandemic, which necessitated consideration that the patient might be at risk for infection with the SARS-CoV-2 virus that causes COVID-19. Institutional protocols and algorithms that pertain to the evaluation of patients at risk for COVID-19 are in a state of rapid change based on information released by regulatory bodies including the CDC and federal and state organizations. These policies and algorithms were followed during the patient's care in the ED.  Patient is presenting for evaluation of reported hypertension.  Patient's HTN is chronic in nature.  Patient without clear evidence of endorgan damage on evaluation.  Patient was insistent upon obtaining screening labs.  Creatinine today is 2.4.  Case discussed briefly with Dr. Joelyn Oms covering for [facility].  He agrees with plan for outpatient  follow-up.  Patient is advised to call [facility]  tomorrow for further outpatient work-up.  Patient advised to closely follow-up with her primary care provider for further treatment of her hypertension.  Strict return precautions given and understood. Final Clinical Impression(s) / ED Diagnoses Final diagnoses:  Hypertension, unspecified type  Chronic renal impairment, unspecified CKD stage    Rx / DC Orders ED Discharge Orders    None       Valarie Merino, MD 11/01/20 1733

## 2020-11-02 ENCOUNTER — Encounter (HOSPITAL_COMMUNITY)
Admission: RE | Admit: 2020-11-02 | Discharge: 2020-11-02 | Disposition: A | Discharge status: Home / Self Care | Payer: Medicare Other | Source: Ambulatory Visit | Attending: Nephrology | Admitting: Nephrology

## 2020-11-02 VITALS — BP 163/70 | HR 82 | Temp 97.6°F | Resp 18

## 2020-11-02 DIAGNOSIS — N183 Chronic kidney disease, stage 3 unspecified: Secondary | ICD-10-CM | POA: Diagnosis not present

## 2020-11-02 DIAGNOSIS — D631 Anemia in chronic kidney disease: Secondary | ICD-10-CM

## 2020-11-02 MED ORDER — EPOETIN ALFA-EPBX 2000 UNIT/ML IJ SOLN
INTRAMUSCULAR | Status: AC
  Administered 2020-11-02: 15000 [IU]
  Filled 2020-11-02: qty 1

## 2020-11-02 MED ORDER — EPOETIN ALFA-EPBX 3000 UNIT/ML IJ SOLN
INTRAMUSCULAR | Status: AC
  Filled 2020-11-02: qty 1

## 2020-11-02 MED ORDER — EPOETIN ALFA-EPBX 10000 UNIT/ML IJ SOLN
INTRAMUSCULAR | Status: AC
  Filled 2020-11-02: qty 1

## 2020-11-02 MED ORDER — EPOETIN ALFA-EPBX 10000 UNIT/ML IJ SOLN: 15000.0000 [IU] | INTRAMUSCULAR | Status: DC

## 2020-11-03 LAB — POCT HEMOGLOBIN-HEMACUE: Hemoglobin: 10.7 g/dL — ABNORMAL LOW (ref 12.0–15.0)

## 2020-11-16 ENCOUNTER — Encounter (HOSPITAL_COMMUNITY): Payer: Medicare Other

## 2020-11-30 ENCOUNTER — Other Ambulatory Visit: Payer: Self-pay

## 2020-11-30 ENCOUNTER — Encounter (HOSPITAL_COMMUNITY)
Admission: RE | Admit: 2020-11-30 | Discharge: 2020-11-30 | Disposition: A | Discharge status: Home / Self Care | Payer: Medicare Other | Source: Ambulatory Visit | Attending: Nephrology | Admitting: Nephrology

## 2020-11-30 ENCOUNTER — Encounter (HOSPITAL_COMMUNITY): Payer: Medicare Other

## 2020-11-30 VITALS — BP 154/58 | HR 62 | Temp 97.7°F | Resp 18

## 2020-11-30 DIAGNOSIS — D631 Anemia in chronic kidney disease: Secondary | ICD-10-CM | POA: Diagnosis present

## 2020-11-30 DIAGNOSIS — N183 Chronic kidney disease, stage 3 unspecified: Secondary | ICD-10-CM

## 2020-11-30 LAB — IRON AND TIBC
Iron: 82 ug/dL (ref 28–170)
Saturation Ratios: 26 % (ref 10.4–31.8)
TIBC: 314 ug/dL (ref 250–450)
UIBC: 232 ug/dL

## 2020-11-30 LAB — FERRITIN: Ferritin: 260 ng/mL (ref 11–307)

## 2020-11-30 LAB — POCT HEMOGLOBIN-HEMACUE: Hemoglobin: 9.9 g/dL — ABNORMAL LOW (ref 12.0–15.0)

## 2020-11-30 MED ORDER — EPOETIN ALFA-EPBX 2000 UNIT/ML IJ SOLN
INTRAMUSCULAR | Status: AC
  Administered 2020-11-30: 2000 [IU] via SUBCUTANEOUS
  Filled 2020-11-30: qty 1

## 2020-11-30 MED ORDER — EPOETIN ALFA-EPBX 3000 UNIT/ML IJ SOLN
INTRAMUSCULAR | Status: AC
  Administered 2020-11-30: 3000 [IU] via SUBCUTANEOUS
  Filled 2020-11-30: qty 1

## 2020-11-30 MED ORDER — EPOETIN ALFA-EPBX 10000 UNIT/ML IJ SOLN
INTRAMUSCULAR | Status: AC
  Administered 2020-11-30: 10000 [IU] via SUBCUTANEOUS
  Filled 2020-11-30: qty 1

## 2020-11-30 MED ORDER — EPOETIN ALFA-EPBX 10000 UNIT/ML IJ SOLN: 15000.0000 [IU] | INTRAMUSCULAR | Status: DC

## 2020-12-06 ENCOUNTER — Other Ambulatory Visit: Payer: Self-pay

## 2020-12-06 ENCOUNTER — Ambulatory Visit (INDEPENDENT_AMBULATORY_CARE_PROVIDER_SITE_OTHER): Payer: Medicare Other | Admitting: Podiatry

## 2020-12-06 DIAGNOSIS — E0843 Diabetes mellitus due to underlying condition with diabetic autonomic (poly)neuropathy: Secondary | ICD-10-CM

## 2020-12-06 NOTE — Progress Notes (Signed)
   HPI: 69 y.o. female presenting today PMHx T2DM presenting today for routine annual evaluation.  Patient states that she would like to have some diabetic shoes.  She does have symptomatic deformity and diabetes with loss of sensation of her feet.  She states that she was diagnosed with peripheral neuropathy 5-10 years ago.  Continually getting worse.  She presents for further treatment evaluation  Past Medical History:  Diagnosis Date  . ALLERGIC RHINITIS 02/19/2007   Qualifier: Diagnosis of  By: Lorne Skeens MD, VALENICA    . Allergy    seasonal  . Anemia 1970s   on iron   . Arthritis   . Cubital tunnel syndrome on right 2020   Secondary to old fracture. Evaluated by ortho. [facility] offered surgery but patient refused. Numbness and burning pain 4th and 5th digit.    . Diabetes mellitus 2000   Never on insulin   . High cholesterol   . Hypertension 2000     Physical Exam: General: The patient is alert and oriented x3 in no acute distress.  Dermatology: Skin is warm, dry and supple bilateral lower extremities. Negative for open lesions or macerations.  Vascular: Palpable pedal pulses bilaterally. No edema or erythema noted. Capillary refill within normal limits.  Neurological: Epicritic and protective threshold diminished bilaterally.   Musculoskeletal Exam: Range of motion within normal limits to all pedal and ankle joints bilateral. Muscle strength 5/5 in all groups bilateral.   Assessment: 1.  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 Patient evaluated. 2.  Comprehensive diabetic foot evaluation was performed today 3.  Appointment made with Pedorthist for custom molded DM shoes and insoles 4.  Return to clinic annually      Edrick Kins, DPM [facility]  Dr. Edrick Kins, DPM    [address], [place] [address]                Office [phone]  Fax [phone]

## 2020-12-14 ENCOUNTER — Other Ambulatory Visit: Payer: Self-pay

## 2020-12-14 ENCOUNTER — Encounter (HOSPITAL_COMMUNITY)
Admission: RE | Admit: 2020-12-14 | Discharge: 2020-12-14 | Disposition: A | Discharge status: Home / Self Care | Payer: Medicare Other | Source: Ambulatory Visit | Attending: Nephrology | Admitting: Nephrology

## 2020-12-14 VITALS — BP 127/66 | HR 75 | Resp 16

## 2020-12-14 DIAGNOSIS — N183 Chronic kidney disease, stage 3 unspecified: Secondary | ICD-10-CM | POA: Diagnosis not present

## 2020-12-14 DIAGNOSIS — D631 Anemia in chronic kidney disease: Secondary | ICD-10-CM

## 2020-12-14 LAB — POCT HEMOGLOBIN-HEMACUE: Hemoglobin: 10.3 g/dL — ABNORMAL LOW (ref 12.0–15.0)

## 2020-12-14 MED ORDER — EPOETIN ALFA-EPBX 3000 UNIT/ML IJ SOLN
INTRAMUSCULAR | Status: AC
  Administered 2020-12-14: 3000 [IU]
  Filled 2020-12-14: qty 1

## 2020-12-14 MED ORDER — EPOETIN ALFA-EPBX 2000 UNIT/ML IJ SOLN
INTRAMUSCULAR | Status: AC
  Administered 2020-12-14: 2000 [IU]
  Filled 2020-12-14: qty 1

## 2020-12-14 MED ORDER — EPOETIN ALFA-EPBX 10000 UNIT/ML IJ SOLN: 15000.0000 [IU] | INTRAMUSCULAR | Status: DC

## 2020-12-14 MED ORDER — EPOETIN ALFA-EPBX 10000 UNIT/ML IJ SOLN
INTRAMUSCULAR | Status: AC
  Administered 2020-12-14: 10000 [IU] via SUBCUTANEOUS
  Filled 2020-12-14: qty 1

## 2020-12-23 ENCOUNTER — Ambulatory Visit: Payer: Medicare Other | Admitting: Orthotics

## 2020-12-23 ENCOUNTER — Other Ambulatory Visit: Payer: Self-pay

## 2020-12-23 DIAGNOSIS — E0843 Diabetes mellitus due to underlying condition with diabetic autonomic (poly)neuropathy: Secondary | ICD-10-CM

## 2020-12-23 DIAGNOSIS — M2142 Flat foot [pes planus] (acquired), left foot: Secondary | ICD-10-CM

## 2020-12-23 DIAGNOSIS — M2041 Other hammer toe(s) (acquired), right foot: Secondary | ICD-10-CM

## 2020-12-23 DIAGNOSIS — M2141 Flat foot [pes planus] (acquired), right foot: Secondary | ICD-10-CM

## 2020-12-23 NOTE — Progress Notes (Signed)

## 2020-12-28 ENCOUNTER — Encounter (HOSPITAL_COMMUNITY)
Admission: RE | Admit: 2020-12-28 | Discharge: 2020-12-28 | Disposition: A | Discharge status: Home / Self Care | Payer: Medicare HMO | Source: Ambulatory Visit | Attending: Nephrology | Admitting: Nephrology

## 2020-12-28 ENCOUNTER — Other Ambulatory Visit: Payer: Self-pay

## 2020-12-28 VITALS — BP 161/71 | HR 77 | Temp 97.9°F | Resp 20

## 2020-12-28 DIAGNOSIS — D631 Anemia in chronic kidney disease: Secondary | ICD-10-CM | POA: Diagnosis present

## 2020-12-28 DIAGNOSIS — N183 Chronic kidney disease, stage 3 unspecified: Secondary | ICD-10-CM | POA: Insufficient documentation

## 2020-12-28 LAB — IRON AND TIBC
Iron: 97 ug/dL (ref 28–170)
Saturation Ratios: 29 % (ref 10.4–31.8)
TIBC: 339 ug/dL (ref 250–450)
UIBC: 242 ug/dL

## 2020-12-28 LAB — POCT HEMOGLOBIN-HEMACUE: Hemoglobin: 10.7 g/dL — ABNORMAL LOW (ref 12.0–15.0)

## 2020-12-28 LAB — FERRITIN: Ferritin: 211 ng/mL (ref 11–307)

## 2020-12-28 MED ORDER — EPOETIN ALFA-EPBX 10000 UNIT/ML IJ SOLN
INTRAMUSCULAR | Status: AC
  Administered 2020-12-28: 10000 [IU] via SUBCUTANEOUS
  Filled 2020-12-28: qty 1

## 2020-12-28 MED ORDER — EPOETIN ALFA-EPBX 3000 UNIT/ML IJ SOLN
INTRAMUSCULAR | Status: AC
  Administered 2020-12-28: 3000 [IU] via SUBCUTANEOUS
  Filled 2020-12-28: qty 1

## 2020-12-28 MED ORDER — EPOETIN ALFA-EPBX 10000 UNIT/ML IJ SOLN: 15000.0000 [IU] | INTRAMUSCULAR | Status: DC

## 2020-12-28 MED ORDER — EPOETIN ALFA-EPBX 2000 UNIT/ML IJ SOLN
INTRAMUSCULAR | Status: AC
  Administered 2020-12-28: 2000 [IU] via SUBCUTANEOUS
  Filled 2020-12-28: qty 1

## 2021-01-11 ENCOUNTER — Encounter (HOSPITAL_COMMUNITY): Payer: Medicare HMO

## 2021-01-25 ENCOUNTER — Other Ambulatory Visit: Payer: Self-pay

## 2021-01-25 ENCOUNTER — Encounter (HOSPITAL_COMMUNITY)
Admission: RE | Admit: 2021-01-25 | Discharge: 2021-01-25 | Disposition: A | Discharge status: Home / Self Care | Payer: Medicare HMO | Source: Ambulatory Visit | Attending: Nephrology | Admitting: Nephrology

## 2021-01-25 VITALS — BP 163/82 | HR 77 | Temp 97.5°F | Resp 20

## 2021-01-25 DIAGNOSIS — N183 Chronic kidney disease, stage 3 unspecified: Secondary | ICD-10-CM | POA: Diagnosis not present

## 2021-01-25 DIAGNOSIS — D631 Anemia in chronic kidney disease: Secondary | ICD-10-CM | POA: Diagnosis present

## 2021-01-25 LAB — FERRITIN: Ferritin: 270 ng/mL (ref 11–307)

## 2021-01-25 LAB — IRON AND TIBC
Iron: 120 ug/dL (ref 28–170)
Saturation Ratios: 35 % — ABNORMAL HIGH (ref 10.4–31.8)
TIBC: 343 ug/dL (ref 250–450)
UIBC: 223 ug/dL

## 2021-01-25 LAB — POCT HEMOGLOBIN-HEMACUE: Hemoglobin: 11.8 g/dL — ABNORMAL LOW (ref 12.0–15.0)

## 2021-01-25 MED ORDER — EPOETIN ALFA-EPBX 10000 UNIT/ML IJ SOLN
INTRAMUSCULAR | Status: AC
  Filled 2021-01-25: qty 2

## 2021-01-25 MED ORDER — EPOETIN ALFA-EPBX 10000 UNIT/ML IJ SOLN
15000.0000 [IU] | INTRAMUSCULAR | Status: DC
  Administered 2021-01-25: 15000 [IU] via SUBCUTANEOUS

## 2021-02-08 ENCOUNTER — Other Ambulatory Visit: Payer: Self-pay

## 2021-02-08 ENCOUNTER — Encounter (HOSPITAL_COMMUNITY)
Admission: RE | Admit: 2021-02-08 | Discharge: 2021-02-08 | Disposition: A | Discharge status: Home / Self Care | Payer: Medicare HMO | Source: Ambulatory Visit | Attending: Nephrology | Admitting: Nephrology

## 2021-02-08 DIAGNOSIS — N183 Chronic kidney disease, stage 3 unspecified: Secondary | ICD-10-CM | POA: Diagnosis not present

## 2021-02-08 DIAGNOSIS — D631 Anemia in chronic kidney disease: Secondary | ICD-10-CM

## 2021-02-08 LAB — POCT HEMOGLOBIN-HEMACUE: Hemoglobin: 10.6 g/dL — ABNORMAL LOW (ref 12.0–15.0)

## 2021-02-08 MED ORDER — EPOETIN ALFA-EPBX 10000 UNIT/ML IJ SOLN
INTRAMUSCULAR | Status: AC
  Filled 2021-02-08: qty 2

## 2021-02-08 MED ORDER — EPOETIN ALFA-EPBX 10000 UNIT/ML IJ SOLN
15000.0000 [IU] | INTRAMUSCULAR | Status: DC
  Administered 2021-02-08: 15000 [IU] via SUBCUTANEOUS

## 2021-02-22 ENCOUNTER — Other Ambulatory Visit: Payer: Self-pay

## 2021-02-22 ENCOUNTER — Encounter (HOSPITAL_COMMUNITY)
Admission: RE | Admit: 2021-02-22 | Discharge: 2021-02-22 | Disposition: A | Discharge status: Home / Self Care | Payer: Medicare HMO | Source: Ambulatory Visit | Attending: Nephrology | Admitting: Nephrology

## 2021-02-22 VITALS — BP 175/73 | HR 68 | Temp 97.3°F | Resp 20

## 2021-02-22 DIAGNOSIS — D631 Anemia in chronic kidney disease: Secondary | ICD-10-CM | POA: Diagnosis present

## 2021-02-22 DIAGNOSIS — N183 Chronic kidney disease, stage 3 unspecified: Secondary | ICD-10-CM | POA: Insufficient documentation

## 2021-02-22 LAB — IRON AND TIBC
Iron: 96 ug/dL (ref 28–170)
Saturation Ratios: 29 % (ref 10.4–31.8)
TIBC: 333 ug/dL (ref 250–450)
UIBC: 237 ug/dL

## 2021-02-22 LAB — FERRITIN: Ferritin: 277 ng/mL (ref 11–307)

## 2021-02-22 LAB — POCT HEMOGLOBIN-HEMACUE: Hemoglobin: 10.8 g/dL — ABNORMAL LOW (ref 12.0–15.0)

## 2021-02-22 MED ORDER — EPOETIN ALFA-EPBX 2000 UNIT/ML IJ SOLN
INTRAMUSCULAR | Status: AC
  Filled 2021-02-22: qty 1

## 2021-02-22 MED ORDER — EPOETIN ALFA-EPBX 10000 UNIT/ML IJ SOLN
15000.0000 [IU] | INTRAMUSCULAR | Status: DC
  Administered 2021-02-22: 15000 [IU] via SUBCUTANEOUS

## 2021-02-22 MED ORDER — EPOETIN ALFA-EPBX 3000 UNIT/ML IJ SOLN
INTRAMUSCULAR | Status: AC
  Filled 2021-02-22: qty 1

## 2021-02-22 MED ORDER — EPOETIN ALFA-EPBX 10000 UNIT/ML IJ SOLN
INTRAMUSCULAR | Status: AC
  Filled 2021-02-22: qty 1

## 2021-03-04 ENCOUNTER — Telehealth: Payer: Self-pay | Admitting: Podiatry

## 2021-03-04 NOTE — Telephone Encounter (Signed)
Pt left message stating she needs to pick up her diabetic shoes.  I returned call and left message for pt to call back to schedule an appt next available is 3.21 or 3.22

## 2021-03-08 ENCOUNTER — Telehealth: Payer: Self-pay | Admitting: Podiatry

## 2021-03-08 ENCOUNTER — Other Ambulatory Visit: Payer: Self-pay

## 2021-03-08 ENCOUNTER — Encounter (HOSPITAL_COMMUNITY)
Admission: RE | Admit: 2021-03-08 | Discharge: 2021-03-08 | Disposition: A | Discharge status: Home / Self Care | Payer: Medicare HMO | Source: Ambulatory Visit | Attending: Nephrology | Admitting: Nephrology

## 2021-03-08 VITALS — BP 179/86 | HR 75 | Resp 20

## 2021-03-08 DIAGNOSIS — N183 Chronic kidney disease, stage 3 unspecified: Secondary | ICD-10-CM | POA: Diagnosis not present

## 2021-03-08 DIAGNOSIS — D631 Anemia in chronic kidney disease: Secondary | ICD-10-CM

## 2021-03-08 LAB — POCT HEMOGLOBIN-HEMACUE: Hemoglobin: 11.5 g/dL — ABNORMAL LOW (ref 12.0–15.0)

## 2021-03-08 MED ORDER — EPOETIN ALFA-EPBX 10000 UNIT/ML IJ SOLN: 15000.0000 [IU] | INTRAMUSCULAR | Status: DC

## 2021-03-08 MED ORDER — EPOETIN ALFA-EPBX 10000 UNIT/ML IJ SOLN
INTRAMUSCULAR | Status: AC
  Administered 2021-03-08: 15000 [IU] via SUBCUTANEOUS
  Filled 2021-03-08: qty 2

## 2021-03-08 NOTE — Telephone Encounter (Signed)
Pt left message stating she needs to schedule an appt to pick up diabetic shoes. But no number or dob.  I returned call and left message for pt to call to schedule an appt that we do have availability tomorrow afternoon. When I call it goes directly to voicemail.

## 2021-03-15 ENCOUNTER — Telehealth: Payer: Self-pay | Admitting: Podiatry

## 2021-03-15 NOTE — Telephone Encounter (Signed)
Pt left message  @ 132pm stating she was calling to schedule an appt to pick up her diabetic shoes,  I returned call @136  and it goes directly to voicemail. But did leave message that I have available appts for 4.1.2022 and 4.11.2022 for an appt to pick up her shoes.

## 2021-03-18 ENCOUNTER — Ambulatory Visit (INDEPENDENT_AMBULATORY_CARE_PROVIDER_SITE_OTHER): Payer: Medicare HMO | Admitting: Podiatry

## 2021-03-18 ENCOUNTER — Other Ambulatory Visit: Payer: Self-pay

## 2021-03-18 DIAGNOSIS — M2041 Other hammer toe(s) (acquired), right foot: Secondary | ICD-10-CM

## 2021-03-18 DIAGNOSIS — M2042 Other hammer toe(s) (acquired), left foot: Secondary | ICD-10-CM | POA: Diagnosis not present

## 2021-03-18 DIAGNOSIS — M2142 Flat foot [pes planus] (acquired), left foot: Secondary | ICD-10-CM

## 2021-03-18 DIAGNOSIS — E1140 Type 2 diabetes mellitus with diabetic neuropathy, unspecified: Secondary | ICD-10-CM

## 2021-03-18 DIAGNOSIS — M2141 Flat foot [pes planus] (acquired), right foot: Secondary | ICD-10-CM

## 2021-03-18 DIAGNOSIS — E0843 Diabetes mellitus due to underlying condition with diabetic autonomic (poly)neuropathy: Secondary | ICD-10-CM

## 2021-03-21 NOTE — Progress Notes (Signed)
The patient presented to the office today to pick up diabetic shoes and 3 pair diabetic custom inserts.  1 pair of inserts were put in the shoes and the shoes were fitted to the patient. The patient states they are comfortable and free of defect. She was satisfied with the fit of the shoe. Instructions for break in and wear were dispensed. The patient signed the delivery documentation and break in instruction form.  If any questions or concerns arise, she is instructed to call. 

## 2021-03-22 ENCOUNTER — Ambulatory Visit (HOSPITAL_COMMUNITY)
Admission: RE | Admit: 2021-03-22 | Discharge: 2021-03-22 | Disposition: A | Discharge status: Home / Self Care | Payer: Medicare HMO | Source: Ambulatory Visit | Attending: Nephrology | Admitting: Nephrology

## 2021-03-22 ENCOUNTER — Other Ambulatory Visit: Payer: Self-pay

## 2021-03-22 VITALS — BP 162/72 | HR 79 | Temp 97.2°F | Resp 20

## 2021-03-22 DIAGNOSIS — D631 Anemia in chronic kidney disease: Secondary | ICD-10-CM | POA: Diagnosis present

## 2021-03-22 DIAGNOSIS — N183 Chronic kidney disease, stage 3 unspecified: Secondary | ICD-10-CM

## 2021-03-22 LAB — IRON AND TIBC
Iron: 84 ug/dL (ref 28–170)
Saturation Ratios: 25 % (ref 10.4–31.8)
TIBC: 330 ug/dL (ref 250–450)
UIBC: 246 ug/dL

## 2021-03-22 LAB — POCT HEMOGLOBIN-HEMACUE: Hemoglobin: 11 g/dL — ABNORMAL LOW (ref 12.0–15.0)

## 2021-03-22 LAB — FERRITIN: Ferritin: 224 ng/mL (ref 11–307)

## 2021-03-22 MED ORDER — EPOETIN ALFA-EPBX 10000 UNIT/ML IJ SOLN
INTRAMUSCULAR | Status: AC
  Filled 2021-03-22: qty 2

## 2021-03-22 MED ORDER — EPOETIN ALFA-EPBX 10000 UNIT/ML IJ SOLN
15000.0000 [IU] | INTRAMUSCULAR | Status: DC
  Administered 2021-03-22: 15000 [IU] via SUBCUTANEOUS

## 2021-04-05 ENCOUNTER — Encounter (HOSPITAL_COMMUNITY)
Admission: RE | Admit: 2021-04-05 | Discharge: 2021-04-05 | Disposition: A | Discharge status: Home / Self Care | Payer: Medicare HMO | Source: Ambulatory Visit | Attending: Nephrology | Admitting: Nephrology

## 2021-04-05 ENCOUNTER — Other Ambulatory Visit: Payer: Self-pay

## 2021-04-05 VITALS — BP 174/80 | HR 69 | Temp 97.7°F | Resp 18

## 2021-04-05 DIAGNOSIS — D631 Anemia in chronic kidney disease: Secondary | ICD-10-CM | POA: Insufficient documentation

## 2021-04-05 DIAGNOSIS — N183 Chronic kidney disease, stage 3 unspecified: Secondary | ICD-10-CM

## 2021-04-05 LAB — POCT HEMOGLOBIN-HEMACUE: Hemoglobin: 11.4 g/dL — ABNORMAL LOW (ref 12.0–15.0)

## 2021-04-05 MED ORDER — EPOETIN ALFA-EPBX 10000 UNIT/ML IJ SOLN
15000.0000 [IU] | INTRAMUSCULAR | Status: DC
  Administered 2021-04-05: 15000 [IU] via SUBCUTANEOUS

## 2021-04-05 MED ORDER — EPOETIN ALFA-EPBX 10000 UNIT/ML IJ SOLN
INTRAMUSCULAR | Status: AC
  Filled 2021-04-05: qty 2

## 2021-04-19 ENCOUNTER — Inpatient Hospital Stay (HOSPITAL_COMMUNITY): Admission: RE | Admit: 2021-04-19 | Payer: Medicare HMO | Source: Ambulatory Visit

## 2021-05-03 ENCOUNTER — Encounter (HOSPITAL_COMMUNITY): Payer: Medicare HMO

## 2021-05-04 ENCOUNTER — Other Ambulatory Visit: Payer: Self-pay

## 2021-05-04 ENCOUNTER — Ambulatory Visit (HOSPITAL_COMMUNITY)
Admission: RE | Admit: 2021-05-04 | Discharge: 2021-05-04 | Disposition: A | Discharge status: Home / Self Care | Payer: Medicare Other | Source: Ambulatory Visit | Attending: Nephrology | Admitting: Nephrology

## 2021-05-04 VITALS — BP 169/73 | HR 90 | Temp 96.9°F | Resp 20

## 2021-05-04 DIAGNOSIS — N183 Chronic kidney disease, stage 3 unspecified: Secondary | ICD-10-CM | POA: Insufficient documentation

## 2021-05-04 DIAGNOSIS — D631 Anemia in chronic kidney disease: Secondary | ICD-10-CM | POA: Diagnosis present

## 2021-05-04 LAB — POCT HEMOGLOBIN-HEMACUE: Hemoglobin: 11.3 g/dL — ABNORMAL LOW (ref 12.0–15.0)

## 2021-05-04 LAB — IRON AND TIBC
Iron: 143 ug/dL (ref 28–170)
Saturation Ratios: 42 % — ABNORMAL HIGH (ref 10.4–31.8)
TIBC: 337 ug/dL (ref 250–450)
UIBC: 194 ug/dL

## 2021-05-04 LAB — FERRITIN: Ferritin: 280 ng/mL (ref 11–307)

## 2021-05-04 MED ORDER — EPOETIN ALFA-EPBX 10000 UNIT/ML IJ SOLN
INTRAMUSCULAR | Status: AC
  Administered 2021-05-04: 10000 [IU] via SUBCUTANEOUS
  Filled 2021-05-04: qty 1

## 2021-05-04 MED ORDER — EPOETIN ALFA-EPBX 10000 UNIT/ML IJ SOLN
15000.0000 [IU] | INTRAMUSCULAR | Status: DC
Start: 2021-05-04 — End: 2021-05-05

## 2021-05-04 MED ORDER — EPOETIN ALFA-EPBX 2000 UNIT/ML IJ SOLN
INTRAMUSCULAR | Status: AC
  Administered 2021-05-04: 2000 [IU] via SUBCUTANEOUS
  Filled 2021-05-04: qty 1

## 2021-05-04 MED ORDER — EPOETIN ALFA-EPBX 3000 UNIT/ML IJ SOLN
INTRAMUSCULAR | Status: AC
  Administered 2021-05-04: 3000 [IU] via SUBCUTANEOUS
  Filled 2021-05-04: qty 1

## 2021-05-08 ENCOUNTER — Emergency Department (HOSPITAL_BASED_OUTPATIENT_CLINIC_OR_DEPARTMENT_OTHER)
Admission: EM | Admit: 2021-05-08 | Discharge: 2021-05-08 | Disposition: A | Discharge status: Home / Self Care | Payer: Medicare Other | Attending: Emergency Medicine | Admitting: Emergency Medicine

## 2021-05-08 ENCOUNTER — Emergency Department (HOSPITAL_BASED_OUTPATIENT_CLINIC_OR_DEPARTMENT_OTHER): Payer: Medicare Other

## 2021-05-08 ENCOUNTER — Encounter (HOSPITAL_BASED_OUTPATIENT_CLINIC_OR_DEPARTMENT_OTHER): Payer: Self-pay | Admitting: *Deleted

## 2021-05-08 ENCOUNTER — Other Ambulatory Visit: Payer: Self-pay

## 2021-05-08 DIAGNOSIS — E1140 Type 2 diabetes mellitus with diabetic neuropathy, unspecified: Secondary | ICD-10-CM | POA: Diagnosis not present

## 2021-05-08 DIAGNOSIS — J069 Acute upper respiratory infection, unspecified: Secondary | ICD-10-CM | POA: Diagnosis not present

## 2021-05-08 DIAGNOSIS — Z20822 Contact with and (suspected) exposure to covid-19: Secondary | ICD-10-CM | POA: Diagnosis not present

## 2021-05-08 DIAGNOSIS — R059 Cough, unspecified: Secondary | ICD-10-CM | POA: Diagnosis present

## 2021-05-08 DIAGNOSIS — Z8616 Personal history of COVID-19: Secondary | ICD-10-CM | POA: Insufficient documentation

## 2021-05-08 DIAGNOSIS — Z79899 Other long term (current) drug therapy: Secondary | ICD-10-CM | POA: Insufficient documentation

## 2021-05-08 DIAGNOSIS — Z794 Long term (current) use of insulin: Secondary | ICD-10-CM | POA: Diagnosis not present

## 2021-05-08 DIAGNOSIS — I110 Hypertensive heart disease with heart failure: Secondary | ICD-10-CM | POA: Diagnosis not present

## 2021-05-08 DIAGNOSIS — I509 Heart failure, unspecified: Secondary | ICD-10-CM | POA: Diagnosis not present

## 2021-05-08 MED ORDER — BENZONATATE 100 MG PO CAPS: 100.0000 mg | ORAL_CAPSULE | Freq: Three times a day (TID) | ORAL | 0 refills | Status: DC

## 2021-05-08 NOTE — ED Triage Notes (Signed)
Coughing since Tuesday, productive non colored phlegm. Describes is as feeling as if she has the flu. No fever N/VD reported.

## 2021-05-08 NOTE — ED Provider Notes (Signed)
[place] EMERGENCY DEPARTMENT Provider Note   CSN: [id number] Arrival date & time: 05/08/21  1500     History Chief Complaint  Patient presents with  . Cough    Veronica Simmons is a 70 y.o. female.  70 yo F with a chief complaint of a cough.  Going on for about 5 or 6 days now.  She had a relative that was sick with COVID recently.  She actually had COVID back at the end of last year.  Has been coughing and feeling slightly unwell and having some subjective fevers and chills.  Has had some loose stools but no overt diarrhea.  No nausea or vomiting.  The history is provided by the patient.  Illness Severity:  Moderate Onset quality:  Gradual Duration:  5 days Timing:  Constant Progression:  Worsening Chronicity:  New Associated symptoms: cough, fatigue and fever (subjective)   Associated symptoms: no chest pain, no congestion, no headaches, no myalgias, no nausea, no rhinorrhea, no shortness of breath, no vomiting and no wheezing        Past Medical History:  Diagnosis Date  . ALLERGIC RHINITIS 02/19/2007   Qualifier: Diagnosis of  By: Lorne Skeens MD, VALENICA    . Allergy    seasonal  . Anemia 1970s   on iron   . Arthritis   . Cubital tunnel syndrome on right 2020   Secondary to old fracture. Evaluated by ortho. [facility] offered surgery but patient refused. Numbness and burning pain 4th and 5th digit.    . Diabetes mellitus 2000   Never on insulin   . High cholesterol   . Hypertension 2000    Patient Active Problem List   Diagnosis Date Noted  . Symptomatic anemia 08/13/2020  . Generalized weakness 08/13/2020  . Pneumonia due to COVID-19 virus 08/03/2020  . Acute respiratory failure with hypoxemia ([place]) 08/03/2020  . AKI (acute kidney injury) ([place]) 08/03/2020  . Hyponatremia 08/03/2020  . New onset of congestive heart failure ([place]) 10/17/2019  . Hypertensive urgency 10/17/2019  . Right sided weakness 03/07/2018  . HTN  (hypertension) 03/07/2018  . Obesity (BMI 30-39.9) 03/07/2018  . Skin papules, generalized 08/04/2016  . Skin lesion of right lower limb 06/19/2016  . Leg swelling 05/12/2016  . Subconjunctival hemorrhage of right eye 04/27/2016  . Diarrhea 01/09/2016  . Health care maintenance 06/28/2015  . Anemia 12/21/2014  . Palpitations 01/07/2014  . Recurrent yeast vaginitis 11/05/2013  . Recurrent genital herpes 07/17/2013  . Hot flashes not due to menopause 11/27/2012  . Cubital tunnel syndrome on right 10/31/2012  . Type 2 diabetes, controlled, with neuropathy ([place]) 02/14/2007  . Hyperlipidemia 02/14/2007  . OBESITY, NOS 02/14/2007  . HYPERTENSION, BENIGN SYSTEMIC 02/14/2007  . ARTHRITIS 02/14/2007  . Insomnia 02/14/2007    Past Surgical History:  Procedure Laterality Date  . DG HAND RIGHT COMPLETE ([place] HX) Right 2020   right hand/wrist/elbow surgery to fix carpel tunnel/pinched nerve     OB History   No obstetric history on file.     Family History  Problem Relation Age of Onset  . Early death Mother 84       shot   . Heart disease Father 41  . Diabetes Daughter   . Kidney disease Daughter        on dialysis   . Colon polyps Daughter   . Diabetes Maternal Aunt   . Cancer Paternal Uncle        Breast Cancer   .  Breast cancer Maternal Grandmother   . Colon cancer Neg Hx   . Esophageal cancer Neg Hx   . Stomach cancer Neg Hx   . Rectal cancer Neg Hx     Social History   Tobacco Use  . Smoking status: Never Smoker  . Smokeless tobacco: Never Used  Vaping Use  . Vaping Use: Never used  Substance Use Topics  . Alcohol use: No  . Drug use: No    Home Medications Prior to Admission medications   Medication Sig Start Date End Date Taking? Authorizing Provider  benzonatate (TESSALON) 100 MG capsule Take 1 capsule (100 mg total) by mouth every 8 (eight) hours. 05/08/21  Yes Deno Etienne, DO  amLODipine (NORVASC) 10 MG tablet Take 1 tablet (10 mg total) by mouth daily.  10/18/19   Dana Allan I, MD  carvedilol (COREG) 25 MG tablet Take 25 mg by mouth 2 (two) times daily. 07/20/20   [provider]  Coenzyme Q10 (COQ10 PO) Take 1 tablet by mouth daily.    [provider]  ferrous sulfate 325 (65 FE) MG tablet Take 1 tablet by mouth daily.    [provider]  furosemide (LASIX) 80 MG tablet Take 1 tablet (80 mg total) by mouth daily. 08/14/20 08/14/21  Ghimire, Henreitta Leber, MD  glucose blood (ACCU-CHEK AVIVA PLUS) test strip Use as instructed Patient taking differently: 1 each by Other route as directed. Use as instructed 01/06/16   Haney, Yetta Flock A, MD  glucose blood (ACCU-CHEK SMARTVIEW) test strip Test blood sugar once daily Patient taking differently: 1 each by Other route See admin instructions. Test blood sugar once daily 10/12/16   Haney, Alyssa A, MD  glucose blood test strip Use as instructed Patient taking differently: 1 each by Other route as directed. Use as instructed 12/29/15   Marina Goodell A, MD  hydrALAZINE (APRESOLINE) 25 MG tablet Take 1 tablet (25 mg total) by mouth every 8 (eight) hours. 08/09/20 09/08/20  Barb Merino, MD  insulin lispro (HUMALOG) 100 UNIT/ML KwikPen Inject 0.01-0.09 mLs (1-9 Units total) into the skin 3 (three) times daily before meals. Blood sugars 70-120: no Insulin 121-150: 2 units 151-200: 3 units 201-250: 4 units 251-300: 5 units 301-350: 6 units 351-400: 9 units More than 400 use 9 units and call your doctor 08/09/20   Barb Merino, MD  Lancets [facility] ULTRASOFT) lancets Use as instructed Patient taking differently: 1 each by Other route as directed. Use as instructed 08/27/15   Marina Goodell A, MD  traZODone (DESYREL) 100 MG tablet Take 200 mg by mouth at bedtime as needed for sleep.  03/18/20   [provider]    Allergies    Levofloxacin  Review of Systems   Review of Systems  Constitutional: Positive for chills, fatigue and fever (subjective).  HENT: Negative for congestion and  rhinorrhea.   Eyes: Negative for redness and visual disturbance.  Respiratory: Positive for cough. Negative for shortness of breath and wheezing.   Cardiovascular: Negative for chest pain and palpitations.  Gastrointestinal: Negative for nausea and vomiting.  Genitourinary: Negative for dysuria and urgency.  Musculoskeletal: Negative for arthralgias and myalgias.  Skin: Negative for pallor and wound.  Neurological: Negative for dizziness and headaches.    Physical Exam Updated Vital Signs BP (!) 148/68 Comment: Resp 18  Pulse 86 Comment: Resp 18  Temp 98 F (36.7 C) (Oral)   Resp 16   Ht 5\' 5"  (1.651 m)   Wt 104.3 kg   SpO2  95% Comment: Resp 18  BMI 38.27 kg/m   Physical Exam Vitals and nursing note reviewed.  Constitutional:      General: She is not in acute distress.    Appearance: She is well-developed. She is not diaphoretic.  HENT:     Head: Normocephalic and atraumatic.  Eyes:     Pupils: Pupils are equal, round, and reactive to light.  Cardiovascular:     Rate and Rhythm: Normal rate and regular rhythm.     Heart sounds: No murmur heard. No friction rub. No gallop.   Pulmonary:     Effort: Pulmonary effort is normal.     Breath sounds: No wheezing or rales.  Abdominal:     General: There is no distension.     Palpations: Abdomen is soft.     Tenderness: There is no abdominal tenderness.  Musculoskeletal:        General: No tenderness.     Cervical back: Normal range of motion and neck supple.  Skin:    General: Skin is warm and dry.  Neurological:     Mental Status: She is alert and oriented to person, place, and time.  Psychiatric:        Behavior: Behavior normal.     ED Results / Procedures / Treatments   Labs (all labs ordered are listed, but only abnormal results are displayed) Labs Reviewed  SARS CORONAVIRUS 2 (TAT 6-24 HRS)    EKG None  Radiology DG Chest Port 1 View  Result Date: 05/08/2021 CLINICAL DATA:  Cough EXAM: PORTABLE CHEST  1 VIEW COMPARISON:  08/13/2020 FINDINGS: Cardiomegaly. No confluent airspace opacities or effusions. No acute bony abnormality. IMPRESSION: Cardiomegaly.  No active disease. Electronically Signed   By: Rolm Baptise M.D.   On: 05/08/2021 15:55    Procedures Procedures   Medications Ordered in ED Medications - No data to display  ED Course  I have reviewed the triage vital signs and the nursing notes.  Pertinent labs & imaging results that were available during my care of the patient were reviewed by me and considered in my medical decision making (see chart for details).    MDM Rules/Calculators/A&P                          70 yo F with a chief complaints of cough and subjective fevers and chills.  Going on for the past 5 or 6 days now.  Well-appearing and nontoxic.  Not requiring oxygen.  Clear lung sounds for me.  Will obtain a chest x-ray.  Will prescribe her some symptomatic therapy have her follow-up with her family doctor in the office.  Chest x-ray viewed by me without focal for pneumothorax.  Will discharge patient home.  PCP follow-up.  4:00 PM:  I have discussed the diagnosis/risks/treatment options with the patient and believe the pt to be eligible for discharge home to follow-up with PCP. We also discussed returning to the ED immediately if new or worsening sx occur. We discussed the sx which are most concerning (e.g., sudden worsening pain, fever, inability to tolerate by mouth) that necessitate immediate return. Medications administered to the patient during their visit and any new prescriptions provided to the patient are listed below.  Medications given during this visit Medications - No data to display   The patient appears reasonably screen and/or stabilized for discharge and I doubt any other medical condition or other [facility] requiring further screening, evaluation, or treatment in  the ED at this time prior to discharge.     Final Clinical Impression(s) / ED  Diagnoses Final diagnoses:  Viral URI with cough    Rx / DC Orders ED Discharge Orders         Ordered    benzonatate (TESSALON) 100 MG capsule  Every 8 hours        05/08/21 [place], Josph [place], DO 05/08/21 1600

## 2021-05-08 NOTE — Discharge Instructions (Signed)
Take tylenol 2 pills 4 times a day.  Drink plenty of fluids.  Return for worsening shortness of breath, headache, confusion. Follow up with your family doctor.     

## 2021-05-09 LAB — RESP PANEL BY RT-PCR (FLU A&B, COVID) ARPGX2
Influenza A by PCR: NEGATIVE
Influenza B by PCR: NEGATIVE
SARS Coronavirus 2 by RT PCR: NEGATIVE

## 2021-05-17 ENCOUNTER — Other Ambulatory Visit: Payer: Self-pay

## 2021-05-17 ENCOUNTER — Ambulatory Visit (HOSPITAL_COMMUNITY)
Admission: RE | Admit: 2021-05-17 | Discharge: 2021-05-17 | Disposition: A | Discharge status: Home / Self Care | Payer: Medicare Other | Source: Ambulatory Visit | Attending: Nephrology | Admitting: Nephrology

## 2021-05-17 VITALS — BP 153/70 | HR 98 | Temp 98.2°F | Resp 18

## 2021-05-17 DIAGNOSIS — D631 Anemia in chronic kidney disease: Secondary | ICD-10-CM | POA: Diagnosis present

## 2021-05-17 DIAGNOSIS — N183 Chronic kidney disease, stage 3 unspecified: Secondary | ICD-10-CM | POA: Diagnosis present

## 2021-05-17 LAB — POCT HEMOGLOBIN-HEMACUE: Hemoglobin: 9.7 g/dL — ABNORMAL LOW (ref 12.0–15.0)

## 2021-05-17 MED ORDER — EPOETIN ALFA-EPBX 10000 UNIT/ML IJ SOLN: 15000.0000 [IU] | INTRAMUSCULAR | Status: DC

## 2021-05-17 MED ORDER — EPOETIN ALFA-EPBX 2000 UNIT/ML IJ SOLN
INTRAMUSCULAR | Status: AC
  Administered 2021-05-17: 2000 [IU]
  Filled 2021-05-17: qty 1

## 2021-05-17 MED ORDER — EPOETIN ALFA-EPBX 3000 UNIT/ML IJ SOLN
INTRAMUSCULAR | Status: AC
  Administered 2021-05-17: 3000 [IU]
  Filled 2021-05-17: qty 1

## 2021-05-17 MED ORDER — EPOETIN ALFA-EPBX 10000 UNIT/ML IJ SOLN
INTRAMUSCULAR | Status: AC
  Administered 2021-05-17: 10000 [IU] via SUBCUTANEOUS
  Filled 2021-05-17: qty 1

## 2021-05-26 ENCOUNTER — Encounter (HOSPITAL_COMMUNITY): Payer: Self-pay

## 2021-05-31 ENCOUNTER — Other Ambulatory Visit: Payer: Self-pay

## 2021-05-31 ENCOUNTER — Encounter (HOSPITAL_COMMUNITY)
Admission: RE | Admit: 2021-05-31 | Discharge: 2021-05-31 | Disposition: A | Discharge status: Home / Self Care | Payer: Medicare Other | Source: Ambulatory Visit | Attending: Nephrology | Admitting: Nephrology

## 2021-05-31 VITALS — BP 162/70 | HR 72 | Temp 97.8°F | Resp 18

## 2021-05-31 DIAGNOSIS — D631 Anemia in chronic kidney disease: Secondary | ICD-10-CM | POA: Insufficient documentation

## 2021-05-31 DIAGNOSIS — N183 Chronic kidney disease, stage 3 unspecified: Secondary | ICD-10-CM | POA: Diagnosis present

## 2021-05-31 LAB — IRON AND TIBC
Iron: 97 ug/dL (ref 28–170)
Saturation Ratios: 27 % (ref 10.4–31.8)
TIBC: 356 ug/dL (ref 250–450)
UIBC: 259 ug/dL

## 2021-05-31 LAB — FERRITIN: Ferritin: 219 ng/mL (ref 11–307)

## 2021-05-31 LAB — POCT HEMOGLOBIN-HEMACUE: Hemoglobin: 10.3 g/dL — ABNORMAL LOW (ref 12.0–15.0)

## 2021-05-31 MED ORDER — EPOETIN ALFA-EPBX 10000 UNIT/ML IJ SOLN
INTRAMUSCULAR | Status: AC
  Filled 2021-05-31: qty 2

## 2021-05-31 MED ORDER — EPOETIN ALFA-EPBX 10000 UNIT/ML IJ SOLN
15000.0000 [IU] | INTRAMUSCULAR | Status: DC
  Administered 2021-05-31: 15000 [IU] via SUBCUTANEOUS

## 2021-06-06 ENCOUNTER — Encounter (HOSPITAL_BASED_OUTPATIENT_CLINIC_OR_DEPARTMENT_OTHER): Payer: Self-pay | Admitting: *Deleted

## 2021-06-06 ENCOUNTER — Emergency Department (HOSPITAL_BASED_OUTPATIENT_CLINIC_OR_DEPARTMENT_OTHER)
Admission: EM | Admit: 2021-06-06 | Discharge: 2021-06-06 | Disposition: A | Discharge status: Home / Self Care | Payer: Medicare Other | Attending: Emergency Medicine | Admitting: Emergency Medicine

## 2021-06-06 ENCOUNTER — Emergency Department (HOSPITAL_BASED_OUTPATIENT_CLINIC_OR_DEPARTMENT_OTHER): Payer: Medicare Other

## 2021-06-06 ENCOUNTER — Other Ambulatory Visit: Payer: Self-pay

## 2021-06-06 DIAGNOSIS — I509 Heart failure, unspecified: Secondary | ICD-10-CM | POA: Insufficient documentation

## 2021-06-06 DIAGNOSIS — Z79899 Other long term (current) drug therapy: Secondary | ICD-10-CM | POA: Diagnosis not present

## 2021-06-06 DIAGNOSIS — R252 Cramp and spasm: Secondary | ICD-10-CM

## 2021-06-06 DIAGNOSIS — E1140 Type 2 diabetes mellitus with diabetic neuropathy, unspecified: Secondary | ICD-10-CM | POA: Insufficient documentation

## 2021-06-06 DIAGNOSIS — Z794 Long term (current) use of insulin: Secondary | ICD-10-CM | POA: Insufficient documentation

## 2021-06-06 DIAGNOSIS — I110 Hypertensive heart disease with heart failure: Secondary | ICD-10-CM | POA: Diagnosis not present

## 2021-06-06 DIAGNOSIS — Z8616 Personal history of COVID-19: Secondary | ICD-10-CM | POA: Insufficient documentation

## 2021-06-06 LAB — BASIC METABOLIC PANEL
Anion gap: 9 (ref 5–15)
BUN: 35 mg/dL — ABNORMAL HIGH (ref 8–23)
CO2: 29 mmol/L (ref 22–32)
Calcium: 9.3 mg/dL (ref 8.9–10.3)
Chloride: 97 mmol/L — ABNORMAL LOW (ref 98–111)
Creatinine, Ser: 2.49 mg/dL — ABNORMAL HIGH (ref 0.44–1.00)
GFR, Estimated: 20 mL/min — ABNORMAL LOW (ref >60)
Glucose, Bld: 236 mg/dL — ABNORMAL HIGH (ref 70–99)
Potassium: 3.7 mmol/L (ref 3.5–5.1)
Sodium: 135 mmol/L (ref 135–145)

## 2021-06-06 LAB — CBC WITH DIFFERENTIAL/PLATELET
Abs Immature Granulocytes: 0.02 10*3/uL (ref 0.00–0.07)
Basophils Absolute: 0 10*3/uL (ref 0.0–0.1)
Basophils Relative: 0 %
Eosinophils Absolute: 0.2 10*3/uL (ref 0.0–0.5)
Eosinophils Relative: 3 %
HCT: 34.8 % — ABNORMAL LOW (ref 36.0–46.0)
Hemoglobin: 10.9 g/dL — ABNORMAL LOW (ref 12.0–15.0)
Immature Granulocytes: 0 %
Lymphocytes Relative: 27 %
Lymphs Abs: 1.8 10*3/uL (ref 0.7–4.0)
MCH: 28.6 pg (ref 26.0–34.0)
MCHC: 31.3 g/dL (ref 30.0–36.0)
MCV: 91.3 fL (ref 80.0–100.0)
Monocytes Absolute: 0.5 10*3/uL (ref 0.1–1.0)
Monocytes Relative: 8 %
Neutro Abs: 4.3 10*3/uL (ref 1.7–7.7)
Neutrophils Relative %: 62 %
Platelets: 338 10*3/uL (ref 150–400)
RBC: 3.81 MIL/uL — ABNORMAL LOW (ref 3.87–5.11)
RDW: 15.2 % (ref 11.5–15.5)
WBC: 6.8 10*3/uL (ref 4.0–10.5)
nRBC: 0 % (ref 0.0–0.2)

## 2021-06-06 LAB — MAGNESIUM: Magnesium: 2.4 mg/dL (ref 1.7–2.4)

## 2021-06-06 NOTE — ED Provider Notes (Signed)
[facility] EMERGENCY DEPARTMENT Provider Note   CSN: [id number] Arrival date & time: 06/06/21  1312     History Chief Complaint  Patient presents with   Leg Pain    Veronica Simmons is a 70 y.o. female who presents primarily with concern for bilateral lower extremity cramping intermittently times multiple weeks, but more severe in the last 2 days.  She has been seen by her primary care doctor for the same concern multiple times and is already on Requip.  She states she presents today to find definitive answers and solutions for cramping.  She denies any numbness, tingling, weakness in her legs, denies difficulty ambulating.  States she ate nearly 1/2 of a jar of mustard last night in an attempt to relieve the cramping.  Additionally she endorses increasing orthopnea in context of heart failure.  Denies any chest pain, shortness of breath, palpitations but states she is propping herself up at nighttime.  States she is taking her furosemide as prescribed.  I personally reviewed this patient's medical records.  She also has history of type 2 diabetes, hypertension, hyperlipidemia, and obesity.  HPI     Past Medical History:  Diagnosis Date   ALLERGIC RHINITIS 02/19/2007   Qualifier: Diagnosis of  By: Lorne Skeens MD, VALENICA     Allergy    seasonal   Anemia 1970s   on iron    Arthritis    Cubital tunnel syndrome on right 2020   Secondary to old fracture. Evaluated by ortho. [facility] offered surgery but patient refused. Numbness and burning pain 4th and 5th digit.     Diabetes mellitus 2000   Never on insulin    High cholesterol    Hypertension 2000    Patient Active Problem List   Diagnosis Date Noted   Symptomatic anemia 08/13/2020   Generalized weakness 08/13/2020   Pneumonia due to COVID-19 virus 08/03/2020   Acute respiratory failure with hypoxemia ([place]) 08/03/2020   AKI (acute kidney injury) ([place]) 08/03/2020   Hyponatremia 08/03/2020    New onset of congestive heart failure ([place]) 10/17/2019   Hypertensive urgency 10/17/2019   Right sided weakness 03/07/2018   HTN (hypertension) 03/07/2018   Obesity (BMI 30-39.9) 03/07/2018   Skin papules, generalized 08/04/2016   Skin lesion of right lower limb 06/19/2016   Leg swelling 05/12/2016   Subconjunctival hemorrhage of right eye 04/27/2016   Diarrhea 01/09/2016   Health care maintenance 06/28/2015   Anemia 12/21/2014   Palpitations 01/07/2014   Recurrent yeast vaginitis 11/05/2013   Recurrent genital herpes 07/17/2013   Hot flashes not due to menopause 11/27/2012   Cubital tunnel syndrome on right 10/31/2012   Type 2 diabetes, controlled, 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     Family History  Problem Relation Age of Onset   Early death Mother 37       shot    Heart disease Father 53   Diabetes Daughter    Kidney disease Daughter        on dialysis    Colon polyps Daughter    Diabetes Maternal Aunt    Cancer Paternal  Uncle        Breast Cancer    Breast cancer Maternal Grandmother    Colon cancer Neg Hx    Esophageal cancer Neg Hx    Stomach cancer Neg Hx    Rectal cancer Neg Hx     Social History   Tobacco Use   Smoking status: Never   Smokeless tobacco: Never  Vaping Use   Vaping Use: Never used  Substance Use Topics   Alcohol use: No   Drug use: No    Home Medications Prior to Admission medications   Medication Sig Start Date End Date Taking? Authorizing Provider  amLODipine (NORVASC) 10 MG tablet Take 1 tablet (10 mg total) by mouth daily. 10/18/19   Bonnell Public, MD   benzonatate (TESSALON) 100 MG capsule Take 1 capsule (100 mg total) by mouth every 8 (eight) hours. 05/08/21   Deno Etienne, DO  carvedilol (COREG) 25 MG tablet Take 25 mg by mouth 2 (two) times daily. 07/20/20   [provider]  Coenzyme Q10 (COQ10 PO) Take 1 tablet by mouth daily.    [provider]  ferrous sulfate 325 (65 FE) MG tablet Take 1 tablet by mouth daily.    [provider]  furosemide (LASIX) 80 MG tablet Take 1 tablet (80 mg total) by mouth daily. 08/14/20 08/14/21  Ghimire, Henreitta Leber, MD  glucose blood (ACCU-CHEK AVIVA PLUS) test strip Use as instructed Patient taking differently: 1 each by Other route as directed. Use as instructed 01/06/16   Haney, Yetta Flock A, MD  glucose blood (ACCU-CHEK SMARTVIEW) test strip Test blood sugar once daily Patient taking differently: 1 each by Other route See admin instructions. Test blood sugar once daily 10/12/16   Haney, Alyssa A, MD  glucose blood test strip Use as instructed Patient taking differently: 1 each by Other route as directed. Use as instructed 12/29/15   Marina Goodell A, MD  hydrALAZINE (APRESOLINE) 25 MG tablet Take 1 tablet (25 mg total) by mouth every 8 (eight) hours. 08/09/20 09/08/20  Barb Merino, MD  insulin lispro (HUMALOG) 100 UNIT/ML KwikPen Inject 0.01-0.09 mLs (1-9 Units total) into the skin 3 (three) times daily before meals. Blood sugars 70-120: no Insulin 121-150: 2 units 151-200: 3 units 201-250: 4 units 251-300: 5 units 301-350: 6 units 351-400: 9 units More than 400 use 9 units and call your doctor 08/09/20   Barb Merino, MD  Lancets [facility] ULTRASOFT) lancets Use as instructed Patient taking differently: 1 each by Other route as directed. Use as instructed 08/27/15   Marina Goodell A, MD  traZODone (DESYREL) 100 MG tablet Take 200 mg by mouth at bedtime as needed for sleep.  03/18/20   [provider]    Allergies    Levofloxacin  Review of Systems   Review of Systems   Constitutional: Negative.   HENT: Negative.    Respiratory:  Positive for shortness of breath. Negative for apnea, choking, chest tightness and wheezing.   Cardiovascular: Negative.   Gastrointestinal: Negative.   Genitourinary: Negative.   Musculoskeletal:  Positive for myalgias.       Cramping in the legs  Skin: Negative.   Neurological: Negative.    Physical Exam Updated Vital Signs BP (!) 161/69 (BP Location: Right Arm)   Pulse 76   Temp 97.7 F (36.5 C)   Resp 18   Ht 5' 5.5" (1.664 m)   Wt 102.1 kg   SpO2 99%   BMI 36.87 kg/m   Physical Exam  Vitals and nursing note reviewed.  Constitutional:      Appearance: She is obese. She is not toxic-appearing.  HENT:     Head: Normocephalic and atraumatic.     Nose: Nose normal.     Mouth/Throat:     Mouth: Mucous membranes are dry.     Pharynx: Oropharynx is clear. Uvula midline. No oropharyngeal exudate or posterior oropharyngeal erythema.     Tonsils: No tonsillar exudate.  Eyes:     General: Lids are normal. Vision grossly intact.        Right eye: No discharge.        Left eye: No discharge.     Extraocular Movements: Extraocular movements intact.     Conjunctiva/sclera: Conjunctivae normal.     Pupils: Pupils are equal, round, and reactive to light.  Neck:     Vascular: No JVD.     Trachea: Trachea and phonation normal.  Cardiovascular:     Rate and Rhythm: Normal rate and regular rhythm.     Pulses: Normal pulses.     Heart sounds: Normal heart sounds. No murmur heard. Pulmonary:     Effort: Pulmonary effort is normal. No tachypnea, bradypnea, accessory muscle usage, prolonged expiration or respiratory distress.     Breath sounds: Normal breath sounds. No wheezing or rales.  Chest:     Chest wall: No mass, lacerations, deformity, swelling, tenderness, crepitus or edema.  Abdominal:     General: Bowel sounds are normal. There is no distension.     Palpations: Abdomen is soft.     Tenderness: There is no  abdominal tenderness.  Musculoskeletal:        General: No deformity.     Cervical back: Normal range of motion and neck supple. No edema, rigidity or crepitus. No pain with movement or muscular tenderness.     Right lower leg: Normal. No edema.     Left lower leg: Normal. No edema.     Right ankle: Normal.     Right Achilles Tendon: Normal.     Left ankle: Normal.     Left Achilles Tendon: Normal.     Right foot: Normal.     Left foot: Normal.  Lymphadenopathy:     Cervical: No cervical adenopathy.  Skin:    General: Skin is warm and dry.     Capillary Refill: Capillary refill takes less than 2 seconds.  Neurological:     Mental Status: She is alert and oriented to person, place, and time. Mental status is at baseline.     Gait: Gait is intact.  Psychiatric:        Mood and Affect: Mood normal.    ED Results / Procedures / Treatments   Labs (all labs ordered are listed, but only abnormal results are displayed) Labs Reviewed  BASIC METABOLIC PANEL - Abnormal; Notable for the following components:      Result Value   Chloride 97 (*)    Glucose, Bld 236 (*)    BUN 35 (*)    Creatinine, Ser 2.49 (*)    GFR, Estimated 20 (*)    All other components within normal limits  CBC WITH DIFFERENTIAL/PLATELET - Abnormal; Notable for the following components:   RBC 3.81 (*)    Hemoglobin 10.9 (*)    HCT 34.8 (*)    All other components within normal limits  MAGNESIUM    EKG EKG Interpretation  Date/Time:  Monday June 06 2021 15:26:12 EDT Ventricular Rate:  72 PR Interval:  168 QRS Duration: 96 QT Interval:  431 QTC Calculation: 472 R Axis:   48 Text Interpretation: Sinus rhythm No acute changes No significant change since last tracing Confirmed by Varney Biles [phone]) on 06/07/2021 5:34:44 PM  Radiology No results found.  Procedures Procedures   Medications Ordered in ED Medications - No data to display  ED Course  I have reviewed the triage vital signs and the  nursing notes.  Pertinent labs & imaging results that were available during my care of the patient were reviewed by me and considered in my medical decision making (see chart for details).    MDM Rules/Calculators/A&P                         70 year old female presents with concern for lower extremity cramping at nighttime as well as shortness of breath at nighttime, requiring 2 pressors of up on multiple pillows.  Differential diagnose includes but is not limited to CHF, pleural effusion, PE, pneumonia, ACS.  In regards to cramping, possible metabolic derangement, however most nocturnal cramping is idiopathic in nature.  Hypertensive on intake, vital signs otherwise normal.  Cardiopulmonary exam is unremarkable, abdominal exam is benign.  There is no lower extremity edema bilaterally and there is no evidence of deformity or tenderness palpation of the calves.  EKG with sinus rhythm, no acute abnormality.  Chest x-ray negative for evidence of pulmonary vascular congestion or edema, borderline cardiomegaly.  No acute cardiopulmonary process. CBC is remarkable only for anemia of 10.9, at patient's baseline.  BMP with elevated creatinine to 2.4, also at patient's baseline given history of CKD in context of diabetes.  Hyperglycemia of 236.  Magnesium is normal, 2.4.  Given reassuring physical exam, vital signs, and laboratory studies, no further work-up is warranted in ED at this time.  There is no clinical evidence of fluid overload, and this is confirmed with reassuring x-ray.  Patient is already on treatment for nocturnal lower extremity cramping, recommend to follow-up closely with her PCP.  Veronica Simmons voiced understanding of her medical evaluation and treatment plan.  Each of her questions was answered to her expressed satisfaction.  Return precautions were given.  Patient is well-appearing, stable, appropriate for discharge at this time.    This chart was dictated using voice recognition  software, Dragon. Despite the best efforts of this provider to proofread and correct errors, errors may still occur which can change documentation meaning.   Final Clinical Impression(s) / ED Diagnoses Final diagnoses:  Leg cramps    Rx / DC Orders ED Discharge Orders     None        Aura Dials 06/09/21 1810    Hayden Rasmussen, MD 06/10/21 1217

## 2021-06-06 NOTE — Discharge Instructions (Addendum)
You are seen in the ER today for evaluation of your leg cramps.  Your physical exam and vital signs are very reassuring as was your.  There is often not a clear cause for cramping of the legs at nighttime, however there has been some benefit proven for daily stretching of the calf.  Would recommend you incorporate daily stretching of your legs for prevention of cramping at nighttime.  Additionally remain hydrated and you may apply heat to the area at nighttime with a heating pad.  You may follow your primary care doctor.  Return to the ER if you develop any numbness, tingling, weakness in your legs, or any other severe symptoms.

## 2021-06-06 NOTE — ED Triage Notes (Signed)
C/o bil leg cramping x 2 days with hx of same

## 2021-06-06 NOTE — ED Notes (Signed)
Patient transported to X-ray 

## 2021-06-06 NOTE — ED Notes (Signed)
Pt given Oatmeal and diet sprite, PA Aware

## 2021-06-14 ENCOUNTER — Inpatient Hospital Stay (HOSPITAL_COMMUNITY): Admission: RE | Admit: 2021-06-14 | Payer: Medicare Other | Source: Ambulatory Visit

## 2021-06-28 ENCOUNTER — Encounter (HOSPITAL_COMMUNITY): Payer: Medicare Other

## 2021-07-05 ENCOUNTER — Inpatient Hospital Stay (HOSPITAL_COMMUNITY): Admission: RE | Admit: 2021-07-05 | Payer: Medicare Other | Source: Ambulatory Visit

## 2021-08-01 ENCOUNTER — Ambulatory Visit (HOSPITAL_COMMUNITY)
Admission: RE | Admit: 2021-08-01 | Discharge: 2021-08-01 | Disposition: A | Discharge status: Home / Self Care | Payer: Medicare Other | Source: Ambulatory Visit | Attending: Nephrology | Admitting: Nephrology

## 2021-08-01 ENCOUNTER — Other Ambulatory Visit: Payer: Self-pay

## 2021-08-01 VITALS — BP 162/73 | HR 75 | Temp 98.1°F | Resp 20

## 2021-08-01 DIAGNOSIS — D631 Anemia in chronic kidney disease: Secondary | ICD-10-CM | POA: Insufficient documentation

## 2021-08-01 DIAGNOSIS — N183 Chronic kidney disease, stage 3 unspecified: Secondary | ICD-10-CM | POA: Insufficient documentation

## 2021-08-01 LAB — IRON AND TIBC
Iron: 76 ug/dL (ref 28–170)
Saturation Ratios: 22 % (ref 10.4–31.8)
TIBC: 340 ug/dL (ref 250–450)
UIBC: 264 ug/dL

## 2021-08-01 LAB — FERRITIN: Ferritin: 274 ng/mL (ref 11–307)

## 2021-08-01 LAB — POCT HEMOGLOBIN-HEMACUE: Hemoglobin: 9.9 g/dL — ABNORMAL LOW (ref 12.0–15.0)

## 2021-08-01 MED ORDER — EPOETIN ALFA-EPBX 10000 UNIT/ML IJ SOLN: 15000.0000 [IU] | INTRAMUSCULAR | Status: DC

## 2021-08-01 MED ORDER — EPOETIN ALFA-EPBX 10000 UNIT/ML IJ SOLN
INTRAMUSCULAR | Status: AC
  Administered 2021-08-01: 15000 [IU] via SUBCUTANEOUS
  Filled 2021-08-01: qty 2

## 2021-08-12 ENCOUNTER — Other Ambulatory Visit (HOSPITAL_COMMUNITY): Payer: Self-pay | Admitting: *Deleted

## 2021-08-15 ENCOUNTER — Encounter (HOSPITAL_COMMUNITY)
Admission: RE | Admit: 2021-08-15 | Discharge: 2021-08-15 | Disposition: A | Discharge status: Home / Self Care | Payer: Medicare Other | Source: Ambulatory Visit | Attending: Nephrology | Admitting: Nephrology

## 2021-08-15 ENCOUNTER — Other Ambulatory Visit: Payer: Self-pay

## 2021-08-15 VITALS — BP 172/77 | HR 66 | Temp 98.1°F | Resp 18

## 2021-08-15 DIAGNOSIS — D631 Anemia in chronic kidney disease: Secondary | ICD-10-CM | POA: Insufficient documentation

## 2021-08-15 DIAGNOSIS — N183 Chronic kidney disease, stage 3 unspecified: Secondary | ICD-10-CM | POA: Diagnosis present

## 2021-08-15 LAB — POCT HEMOGLOBIN-HEMACUE: Hemoglobin: 10.1 g/dL — ABNORMAL LOW (ref 12.0–15.0)

## 2021-08-15 MED ORDER — EPOETIN ALFA-EPBX 10000 UNIT/ML IJ SOLN
15000.0000 [IU] | INTRAMUSCULAR | Status: DC
  Administered 2021-08-15: 15000 [IU] via SUBCUTANEOUS

## 2021-08-15 MED ORDER — EPOETIN ALFA-EPBX 10000 UNIT/ML IJ SOLN
INTRAMUSCULAR | Status: AC
  Filled 2021-08-15: qty 2

## 2021-08-29 ENCOUNTER — Encounter (HOSPITAL_COMMUNITY): Payer: Medicare Other

## 2021-08-29 ENCOUNTER — Encounter (HOSPITAL_COMMUNITY)
Admission: RE | Admit: 2021-08-29 | Discharge: 2021-08-29 | Disposition: A | Discharge status: Home / Self Care | Payer: Medicare Other | Source: Ambulatory Visit | Attending: Nephrology | Admitting: Nephrology

## 2021-08-29 ENCOUNTER — Other Ambulatory Visit: Payer: Self-pay

## 2021-08-29 VITALS — BP 141/72 | HR 64 | Temp 98.5°F | Resp 18 | Ht 65.5 in | Wt 231.0 lb

## 2021-08-29 DIAGNOSIS — N183 Chronic kidney disease, stage 3 unspecified: Secondary | ICD-10-CM | POA: Insufficient documentation

## 2021-08-29 DIAGNOSIS — D631 Anemia in chronic kidney disease: Secondary | ICD-10-CM | POA: Diagnosis present

## 2021-08-29 LAB — POCT HEMOGLOBIN-HEMACUE: Hemoglobin: 9.9 g/dL — ABNORMAL LOW (ref 12.0–15.0)

## 2021-08-29 MED ORDER — EPOETIN ALFA-EPBX 10000 UNIT/ML IJ SOLN: 15000.0000 [IU] | INTRAMUSCULAR | Status: DC

## 2021-08-29 MED ORDER — EPOETIN ALFA-EPBX 10000 UNIT/ML IJ SOLN
INTRAMUSCULAR | Status: AC
  Administered 2021-08-29: 15000 [IU] via SUBCUTANEOUS
  Filled 2021-08-29: qty 2

## 2021-08-29 MED ORDER — SODIUM CHLORIDE 0.9 % IV SOLN
510.0000 mg | Freq: Once | INTRAVENOUS | Status: AC
  Administered 2021-08-29: 510 mg via INTRAVENOUS
  Filled 2021-08-29: qty 510

## 2021-09-06 ENCOUNTER — Encounter: Payer: Self-pay | Admitting: *Deleted

## 2021-09-07 ENCOUNTER — Ambulatory Visit (INDEPENDENT_AMBULATORY_CARE_PROVIDER_SITE_OTHER): Payer: Medicare Other | Admitting: Neurology

## 2021-09-07 ENCOUNTER — Encounter: Payer: Self-pay | Admitting: Neurology

## 2021-09-07 VITALS — BP 149/77 | HR 74 | Ht 65.5 in | Wt 241.8 lb

## 2021-09-07 DIAGNOSIS — E669 Obesity, unspecified: Secondary | ICD-10-CM

## 2021-09-07 DIAGNOSIS — G4719 Other hypersomnia: Secondary | ICD-10-CM | POA: Diagnosis not present

## 2021-09-07 DIAGNOSIS — R351 Nocturia: Secondary | ICD-10-CM | POA: Diagnosis not present

## 2021-09-07 DIAGNOSIS — R635 Abnormal weight gain: Secondary | ICD-10-CM

## 2021-09-07 DIAGNOSIS — R0683 Snoring: Secondary | ICD-10-CM

## 2021-09-07 DIAGNOSIS — Z820 Family history of epilepsy and other diseases of the nervous system: Secondary | ICD-10-CM

## 2021-09-07 NOTE — Progress Notes (Deleted)
Daytime sleepiness, some snoring. ESS: 18, FSS -63.

## 2021-09-07 NOTE — Progress Notes (Signed)
Subjective:    Patient ID: Veronica Simmons is a 70 y.o. female.  HPI    Star Age, MD, PhD [facility] [address]O. [place], [place] [address]  Dear York Cerise,   I saw your patient, Veronica Simmons, upon your kind request in my sleep clinic today for initial consultation of her sleep disorder, in particular, concern for underlying obstructive sleep apnea.  The patient is unaccompanied today.  As you know, Veronica Simmons is a 70 year old right-handed woman with an underlying medical history of anemia, allergic rhinitis, diabetes, neuropathy, hypertension, hyperlipidemia, chronic kidney disease, and obesity, who reports snoring and excessive daytime somnolence.  She has chronic difficulty maintaining sleep and has been on trazodone for years.  Her daughter has been diagnosed with sleep apnea but is currently not using a CPAP as I understand.  The patient would be willing to proceed with a sleep study but would be very hesitant to use a CPAP machine.  She reports that she would likely not be able to tolerate it and pull it off in the middle of the night.  She is currently not taking the ropinirole.  She has tried gabapentin in the past.  She had side effects from the gabapentin.   I reviewed your office note from 05/25/2021.  She was started on ropinirole 0.25 mg strength at night for restless leg symptoms.  Her Epworth sleepiness score is 18 out of 24, fatigue severity score is 54 out of 63. Of note, she is on potentially sedating medications including baclofen 10 mg 3 times a day, trazodone 100 mg strength, 2 pills at bedtime.  She is no longer on ropinirole.  Bedtime is generally between 1030 and 11 PM and rise time between 630 and 7 AM.  She does not sleep through the night.  She does not have much in the way of trouble going to sleep.  She has night to night nocturia, about once or twice per average night, sometimes as many as 4 times per night.  She  does not have any recurrent morning headaches.  She has been on melatonin also, takes 6 mg at night.  She is divorced, she lives alone.  She has 4 children, 6 grandchildren and 2 great-grandchildren.  She is retired, she used to work as a Social worker.  She has had trouble losing weight.  In fact, in the past 3 months she has gained about 20 pounds by her estimate.  She is a non-smoker and does not drink alcohol, she drinks caffeine in the form of coffee occasionally, no daily caffeine typically.  She has no pets in the household.  Her Past Medical History Is Significant For: Past Medical History:  Diagnosis Date   ALLERGIC RHINITIS 02/19/2007   Qualifier: Diagnosis of  By: Lorne Skeens MD, VALENICA     Allergy    seasonal   Anemia 1970s   on iron    Arthritis    Cubital tunnel syndrome on right 2020   Secondary to old fracture. Evaluated by ortho. [facility] offered surgery but patient refused. Numbness and burning pain 4th and 5th digit.     Daytime sleepiness    Diabetes mellitus 2000   Never on insulin    High cholesterol    Hypertension 2000   Leg cramps     Her Past Surgical History Is Significant For: Past Surgical History:  Procedure Laterality Date   DG HAND RIGHT COMPLETE ([facility] HX) Right 2020  right hand/wrist/elbow surgery to fix carpel tunnel/pinched nerve    Her Family History Is Significant For: Family History  Problem Relation Age of Onset   Early death Mother 55       shot    Heart disease Father 77   Diabetes Daughter    Kidney disease Daughter        on dialysis    Colon polyps Daughter    Diabetes Maternal Aunt    Cancer Paternal Uncle        Breast Cancer    Breast cancer Maternal Grandmother    Colon cancer Neg Hx    Esophageal cancer Neg Hx    Stomach cancer Neg Hx    Rectal cancer Neg Hx     Her Social History Is Significant For: Social History   Socioeconomic History   Marital status: Divorced    Spouse name: Not on file   Number  of children: 4   Years of education: Masters Cu   Highest education level: Not on file  Occupational History   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  Education: Hydrographic surveyor, Work: not working.  Divorced 4 kids. Numerous grandkids, and great grankids.        Social Determinants of Health   Financial Resource Strain: Not on file  Food Insecurity: Not on file  Transportation Needs: Not on file  Physical Activity: Not on file  Stress: Not on file  Social Connections: Not on file    Her Allergies Are:  Allergies  Allergen Reactions   Levofloxacin Hives  :   Her Current Medications Are:  Outpatient Encounter Medications as of 09/07/2021  Medication Sig   amLODipine (NORVASC) 10 MG tablet Take 1 tablet (10 mg total) by mouth daily.   baclofen (LIORESAL) 10 MG tablet    benzonatate (TESSALON) 100 MG capsule Take 1 capsule (100 mg total) by mouth every 8 (eight) hours.   calcitRIOL (ROCALTROL) 0.25 MCG capsule Take 0.25 mcg by mouth daily.   carvedilol (COREG) 25 MG tablet Take 25 mg by mouth 2 (two) times daily.   Coenzyme Q10 (COQ10 PO) Take 1 tablet by mouth daily.   ferrous sulfate 325 (65 FE) MG tablet Take 1 tablet by mouth daily.   fluticasone (FLONASE) 50 MCG/ACT nasal spray    FOLIC ACID PO Take by mouth.   glucose blood (ACCU-CHEK AVIVA PLUS) test strip Use as instructed (Patient taking differently: 1 each by Other route as directed. Use as instructed)   glucose blood (ACCU-CHEK SMARTVIEW) test strip Test blood sugar once daily (Patient taking differently: 1 each  by Other route See admin instructions. Test blood sugar once daily)   glucose blood test strip Use as instructed (Patient taking differently: 1 each by Other route as directed. Use as instructed)   insulin glargine, 2 Unit Dial, (TOUJEO MAX SOLOSTAR) 300 UNIT/ML Solostar Pen Inject into the skin.   insulin lispro (HUMALOG) 100 UNIT/ML KwikPen Inject 0.01-0.09 mLs (1-9 Units total) into the skin 3 (three) times daily before  meals. Blood sugars 70-120: no Insulin 121-150: 2 units 151-200: 3 units 201-250: 4 units 251-300: 5 units 301-350: 6 units 351-400: 9 units More than 400 use 9 units and call your doctor   Iron-Vitamin C 100-250 MG TABS Take by mouth.   Lancets (ONETOUCH ULTRASOFT) lancets Use as instructed (Patient taking differently: 1 each by Other route as directed. Use as instructed)   losartan (COZAAR) 25 MG tablet Take 25 mg by mouth daily.   PRESCRIPTION MEDICATION IRON INFUSIONS as needed. [facility]).   rOPINIRole (REQUIP) 0.5 MG tablet TAKE 1 TABLET(0.5 MG) BY MOUTH AT BEDTIME   traZODone (DESYREL) 100 MG tablet Take 200 mg by mouth at bedtime as needed for sleep.    furosemide (LASIX) 80 MG tablet Take 1 tablet (80 mg total) by mouth daily.   hydrALAZINE (APRESOLINE) 25 MG tablet Take 1 tablet (25 mg total) by mouth every 8 (eight) hours.   No facility-administered encounter medications on file as of 09/07/2021.  :   Review of Systems:  Out of a complete 14 point review of systems, all are reviewed and negative with the exception of these symptoms as listed below:  Review of Systems  Neurological:        Daytime sleepiness, some snoring. ESS: 18, FSS -63.    Objective:  Neurological Exam  Physical Exam Physical Examination:   Vitals:   09/07/21 1109  BP: (!) 149/77  Pulse: 74    General Examination: The patient is a very pleasant 70 y.o. female in no acute distress. She appears well-developed and well-nourished and well groomed.   HEENT: Normocephalic, atraumatic,  pupils are equal, round and reactive to light, extraocular tracking is good without limitation to gaze excursion or nystagmus noted. Hearing is grossly intact. Face is symmetric with normal facial animation. Speech is clear with no dysarthria noted. There is no hypophonia. There is no lip, neck/head, jaw or voice tremor. Neck is supple with full range of passive and active motion. There are no carotid bruits on auscultation. Oropharynx exam reveals: mild mouth dryness, adequate dental hygiene and moderate airway crowding, due to redundant soft palate and tonsillar size of about 1-2+ bilaterally.  Mallampati class II.  Neck circumference of 17-1/4 inches.  She does not have much in the way of overbite.  Tongue protrudes centrally and palate elevates symmetrically.  Chest: Clear to auscultation without wheezing, rhonchi or crackles noted.  Heart: S1+S2+0, regular and normal without murmurs, rubs or gallops noted.   Abdomen: Soft, non-tender and non-distended with normal bowel sounds appreciated on auscultation.  Extremities: There is no pitting edema in the distal lower extremities bilaterally.   Skin: Warm and dry without trophic changes noted.   Musculoskeletal: exam reveals no obvious joint deformities, tenderness or joint swelling or erythema.   Neurologically:  Mental status: The patient is awake, alert and oriented in all 4 spheres. Her immediate and remote memory, attention, language skills and fund of knowledge are appropriate. There is no evidence of aphasia, agnosia, apraxia or anomia. Speech is clear with normal prosody and enunciation. Thought process is linear. Mood is normal and affect is normal.  Cranial nerves II - XII are as described above under HEENT exam.  Motor exam: Normal bulk, strength and tone is noted. There is no tremor, fine motor skills and coordination: grossly intact.  Cerebellar testing: No dysmetria or intention tremor. There is no truncal or gait ataxia.  Sensory  exam: intact to light touch in the upper and lower extremities.  Gait, station and balance: She stands easily. No veering to one side is noted. No leaning to one side is noted. Posture is age-appropriate and stance is narrow based. Gait shows normal stride length and normal pace. No problems turning are noted.   Assessment and Plan:  In summary, Kahmari Herard is a very pleasant 70 y.o.-year old female with an underlying medical history of anemia, allergic rhinitis, diabetes, neuropathy, hypertension, hyperlipidemia, chronic kidney disease, and obesity, whose history and physical exam are concerning for obstructive sleep apnea (OSA). I had a long chat with the patient about my findings and the diagnosis of OSA, its prognosis and treatment options. We talked about medical treatments, surgical interventions and non-pharmacological approaches. I explained in particular the risks and ramifications of untreated moderate to severe OSA, especially with respect to developing cardiovascular disease down the Road, including congestive heart failure, difficult to treat hypertension, cardiac arrhythmias, or stroke. Even type 2 diabetes has, in part, been linked to untreated OSA. Symptoms of untreated OSA include daytime sleepiness, memory problems, mood irritability and mood disorder such as depression and anxiety, lack of energy, as well as recurrent headaches, especially morning headaches. We talked about trying to maintain a healthy lifestyle in general, as well as the importance of weight control. We also talked about the importance of good sleep hygiene. I recommended the following at this time: sleep study.  I outlined the difference between a laboratory attended sleep study versus home sleep test.  She is willing to proceed with testing.  I explained the sleep test procedure to the patient and also outlined possible surgical and non-surgical treatment options of OSA, including the use of a custom-made  dental device (which would require a referral to a specialist dentist or oral surgeon), upper airway surgical options, such as traditional UPPP or a novel less invasive surgical option in the form of Inspire hypoglossal nerve stimulation (which would involve a referral to an ENT surgeon). I also explained the CPAP treatment option to the patient, who indicated that she would not be ready to try CPAP or AutoPap therapy at this time.  We mutually agreed to pick up our discussion after testing.  I answered all her questions today and she was in agreement with the plan.  Thank you very much for allowing me to participate in the care of this nice patient. If I can be of any further assistance to you please do not hesitate to call me at [phone].  Sincerely,   Star Age, MD, PhD

## 2021-09-07 NOTE — Patient Instructions (Signed)

## 2021-09-13 ENCOUNTER — Encounter (HOSPITAL_COMMUNITY)
Admission: RE | Admit: 2021-09-13 | Discharge: 2021-09-13 | Disposition: A | Discharge status: Home / Self Care | Payer: Medicare Other | Source: Ambulatory Visit | Attending: Nephrology | Admitting: Nephrology

## 2021-09-13 ENCOUNTER — Other Ambulatory Visit: Payer: Self-pay

## 2021-09-13 VITALS — BP 160/74 | HR 67 | Temp 98.7°F | Resp 18

## 2021-09-13 DIAGNOSIS — N183 Chronic kidney disease, stage 3 unspecified: Secondary | ICD-10-CM | POA: Diagnosis not present

## 2021-09-13 DIAGNOSIS — D631 Anemia in chronic kidney disease: Secondary | ICD-10-CM

## 2021-09-13 LAB — IRON AND TIBC
Iron: 70 ug/dL (ref 28–170)
Saturation Ratios: 22 % (ref 10.4–31.8)
TIBC: 323 ug/dL (ref 250–450)
UIBC: 253 ug/dL

## 2021-09-13 LAB — POCT HEMOGLOBIN-HEMACUE: Hemoglobin: 10.3 g/dL — ABNORMAL LOW (ref 12.0–15.0)

## 2021-09-13 LAB — FERRITIN: Ferritin: 463 ng/mL — ABNORMAL HIGH (ref 11–307)

## 2021-09-13 MED ORDER — EPOETIN ALFA-EPBX 10000 UNIT/ML IJ SOLN
INTRAMUSCULAR | Status: AC
  Filled 2021-09-13: qty 2

## 2021-09-13 MED ORDER — EPOETIN ALFA-EPBX 10000 UNIT/ML IJ SOLN
15000.0000 [IU] | INTRAMUSCULAR | Status: DC
  Administered 2021-09-13: 15000 [IU] via SUBCUTANEOUS

## 2021-09-27 ENCOUNTER — Ambulatory Visit (HOSPITAL_COMMUNITY)
Admission: RE | Admit: 2021-09-27 | Discharge: 2021-09-27 | Disposition: A | Discharge status: Home / Self Care | Payer: Medicare Other | Source: Ambulatory Visit | Attending: Nephrology | Admitting: Nephrology

## 2021-09-27 ENCOUNTER — Other Ambulatory Visit: Payer: Self-pay

## 2021-09-27 VITALS — BP 141/57 | HR 66 | Temp 97.1°F | Resp 18

## 2021-09-27 DIAGNOSIS — D631 Anemia in chronic kidney disease: Secondary | ICD-10-CM | POA: Diagnosis present

## 2021-09-27 DIAGNOSIS — N183 Chronic kidney disease, stage 3 unspecified: Secondary | ICD-10-CM | POA: Diagnosis not present

## 2021-09-27 LAB — POCT HEMOGLOBIN-HEMACUE: Hemoglobin: 11.2 g/dL — ABNORMAL LOW (ref 12.0–15.0)

## 2021-09-27 MED ORDER — EPOETIN ALFA-EPBX 10000 UNIT/ML IJ SOLN
INTRAMUSCULAR | Status: AC
  Filled 2021-09-27: qty 2

## 2021-09-27 MED ORDER — EPOETIN ALFA-EPBX 10000 UNIT/ML IJ SOLN
15000.0000 [IU] | INTRAMUSCULAR | Status: DC
  Administered 2021-09-27: 15000 [IU] via SUBCUTANEOUS

## 2021-09-28 ENCOUNTER — Ambulatory Visit (INDEPENDENT_AMBULATORY_CARE_PROVIDER_SITE_OTHER): Payer: Medicare Other | Admitting: Neurology

## 2021-09-28 DIAGNOSIS — G4733 Obstructive sleep apnea (adult) (pediatric): Secondary | ICD-10-CM | POA: Diagnosis not present

## 2021-09-28 DIAGNOSIS — E669 Obesity, unspecified: Secondary | ICD-10-CM

## 2021-09-28 DIAGNOSIS — Z820 Family history of epilepsy and other diseases of the nervous system: Secondary | ICD-10-CM

## 2021-09-28 DIAGNOSIS — R635 Abnormal weight gain: Secondary | ICD-10-CM

## 2021-09-28 DIAGNOSIS — R351 Nocturia: Secondary | ICD-10-CM

## 2021-09-28 DIAGNOSIS — G4719 Other hypersomnia: Secondary | ICD-10-CM

## 2021-09-28 DIAGNOSIS — R0683 Snoring: Secondary | ICD-10-CM

## 2021-10-03 NOTE — Progress Notes (Signed)
See procedure note.

## 2021-10-11 ENCOUNTER — Inpatient Hospital Stay (HOSPITAL_COMMUNITY): Admission: RE | Admit: 2021-10-11 | Payer: Medicare Other | Source: Ambulatory Visit

## 2021-10-12 ENCOUNTER — Telehealth: Payer: Self-pay | Admitting: *Deleted

## 2021-10-12 ENCOUNTER — Encounter: Payer: Self-pay | Admitting: *Deleted

## 2021-10-12 NOTE — Telephone Encounter (Signed)
Spoke with patient and discussed sleep study results.  Patient aware her home sleep test showed obstructive sleep apnea in the moderate range.  Patient agrees to a trial of AutoPap she is aware of compliance requirements by insurance which includes using the machine for at least 4 hours every night and also being seen in the office for a follow-up 30 to 90 days after set up.  Patient did not have a preference of DME company.  Advised her we would send an order over to [employer] and the representative which show her how to use the machine, fit her with a mask, and show her how to put the mask on, etc. Patient prefers to give [place] a call to schedule the initial f/u and I encouraged her to do that as soon as she receives her new machine.  Patient confirmed her address for me to send a letter to her home.  Sleep study results sent to PCP. Letter mailed to pt. Order sent securely to [employer].

## 2021-10-12 NOTE — Addendum Note (Signed)
Addended by: Star Age on: 10/12/2021 01:10 PM   Modules accepted: Orders

## 2021-10-12 NOTE — Procedures (Signed)
   [facility]  HOME SLEEP TEST (Watch PAT) REPORT  STUDY DATE: 09/29/2021  DOB: 12-Mar-1951  MRN: [id number]  ORDERING CLINICIAN: Star Age, MD, PhD   REFERRING CLINICIAN: Willeen Niece, PA  CLINICAL INFORMATION/HISTORY:  70 year old right-handed woman with an underlying medical history of anemia, allergic rhinitis, diabetes, neuropathy, hypertension, hyperlipidemia, chronic kidney disease, and obesity, who reports snoring and excessive daytime somnolence.  Epworth sleepiness score: 18/24.  BMI: 39 kg/m  FINDINGS:   Sleep Summary:   Total Recording Time (hours, min): 8 hours, 48 minutes  Total Sleep Time (hours, min):  7 hours, 35 minutes   Percent REM (%):    23.8%   Respiratory Indices:   Calculated pAHI (per hour):  16.8/hour         REM pAHI:    53/hour       NREM pAHI: 7/hour  Oxygen Saturation Statistics:    Oxygen Saturation (%) Mean: 91%   Minimum oxygen saturation (%):                 81%   O2 Saturation Range (%): 81-98%    O2 Saturation (minutes) <=88%: 16.2 min  Pulse Rate Statistics:   Pulse Mean (bpm):    54/min    Pulse Range (54-84/min)   IMPRESSION: OSA (obstructive sleep apnea)   RECOMMENDATION:  This home sleep test demonstrates moderate obstructive sleep apnea with a total AHI of 16.8/hour and O2 nadir of 81%.  Mild to moderate snoring was detected.  Treatment with positive airway pressure is recommended. The patient will be advised to proceed with an autoPAP titration/trial at home for now. A full night titration study may be considered to optimize treatment settings, if needed down the road.  Alternative treatment options may include dental treatment or surgical options.  Concomitant weight loss is recommended.  Please note that untreated obstructive sleep apnea may carry additional perioperative morbidity. Patients with significant obstructive sleep apnea should receive perioperative PAP therapy and the surgeons  and particularly the anesthesiologist should be informed of the diagnosis and the severity of the sleep disordered breathing. The patient should be cautioned not to drive, work at heights, or operate dangerous or heavy equipment when tired or sleepy. Review and reiteration of good sleep hygiene measures should be pursued with any patient. Other causes of the patient's symptoms, including circadian rhythm disturbances, an underlying mood disorder, medication effect and/or an underlying medical problem cannot be ruled out based on this test. Clinical correlation is recommended. The patient and her referring provider will be notified of the test results. The patient will be seen in follow up in sleep clinic at [facility].  I certify that I have reviewed the raw data recording prior to the issuance of this report in accordance with the standards of the American Academy of Sleep Medicine (AASM).  INTERPRETING PHYSICIAN:   Star Age, MD, PhD  Board Certified in Neurology and Sleep Medicine  [facility] [address] [place], [place] [address] [phone]

## 2021-10-12 NOTE — Progress Notes (Signed)
error 

## 2021-10-12 NOTE — Telephone Encounter (Signed)
-----   Message from Star Age, MD sent at 10/12/2021  1:10 PM EDT ----- Patient referred by Willeen Niece, PA, seen by me on 09/07/2021, patient had a HST on 09/29/2021.    Please call and notify the patient that the recent home sleep test showed obstructive sleep apnea in the moderate range. I recommend treatment in the form of autoPAP, which means, that we don't have to bring her in for a sleep study with CPAP, but will let her start using a so called autoPAP machine at home, which is a CPAP-like machine with self-adjusting pressures. We will send the order to a local DME company (of her choice, or as per insurance requirement). The DME representative will fit her with a mask, educate her on how to use the machine, how to put the mask on, etc. I have placed an order in the chart. Please send the order, talk to patient, send report to referring MD. We will need a FU in sleep clinic for 10 weeks post-PAP set up, please arrange that with me or one of our NPs. Also reinforce the need for compliance with treatment. Thanks,   Star Age, MD, PhD [facility])

## 2021-10-23 ENCOUNTER — Emergency Department (HOSPITAL_BASED_OUTPATIENT_CLINIC_OR_DEPARTMENT_OTHER): Payer: Medicare Other

## 2021-10-23 ENCOUNTER — Other Ambulatory Visit: Payer: Self-pay

## 2021-10-23 ENCOUNTER — Emergency Department (HOSPITAL_BASED_OUTPATIENT_CLINIC_OR_DEPARTMENT_OTHER)
Admission: EM | Admit: 2021-10-23 | Discharge: 2021-10-23 | Disposition: A | Discharge status: Home / Self Care | Payer: Medicare Other | Attending: Emergency Medicine | Admitting: Emergency Medicine

## 2021-10-23 DIAGNOSIS — I130 Hypertensive heart and chronic kidney disease with heart failure and stage 1 through stage 4 chronic kidney disease, or unspecified chronic kidney disease: Secondary | ICD-10-CM | POA: Diagnosis not present

## 2021-10-23 DIAGNOSIS — Z8616 Personal history of COVID-19: Secondary | ICD-10-CM | POA: Diagnosis not present

## 2021-10-23 DIAGNOSIS — N189 Chronic kidney disease, unspecified: Secondary | ICD-10-CM | POA: Diagnosis not present

## 2021-10-23 DIAGNOSIS — R42 Dizziness and giddiness: Secondary | ICD-10-CM | POA: Insufficient documentation

## 2021-10-23 DIAGNOSIS — I509 Heart failure, unspecified: Secondary | ICD-10-CM | POA: Diagnosis not present

## 2021-10-23 DIAGNOSIS — D631 Anemia in chronic kidney disease: Secondary | ICD-10-CM | POA: Insufficient documentation

## 2021-10-23 DIAGNOSIS — Z794 Long term (current) use of insulin: Secondary | ICD-10-CM | POA: Insufficient documentation

## 2021-10-23 DIAGNOSIS — Z79899 Other long term (current) drug therapy: Secondary | ICD-10-CM | POA: Diagnosis not present

## 2021-10-23 DIAGNOSIS — E1140 Type 2 diabetes mellitus with diabetic neuropathy, unspecified: Secondary | ICD-10-CM | POA: Diagnosis not present

## 2021-10-23 LAB — URINALYSIS, ROUTINE W REFLEX MICROSCOPIC
Bilirubin Urine: NEGATIVE
Glucose, UA: NEGATIVE mg/dL
Ketones, ur: NEGATIVE mg/dL
Leukocytes,Ua: NEGATIVE
Nitrite: NEGATIVE
Protein, ur: >300 mg/dL — AB
Specific Gravity, Urine: 1.025 (ref 1.005–1.030)
pH: 7.5 (ref 5.0–8.0)

## 2021-10-23 LAB — CBC
HCT: 30.6 % — ABNORMAL LOW (ref 36.0–46.0)
Hemoglobin: 9.3 g/dL — ABNORMAL LOW (ref 12.0–15.0)
MCH: 28.3 pg (ref 26.0–34.0)
MCHC: 30.4 g/dL (ref 30.0–36.0)
MCV: 93 fL (ref 80.0–100.0)
Platelets: 305 10*3/uL (ref 150–400)
RBC: 3.29 MIL/uL — ABNORMAL LOW (ref 3.87–5.11)
RDW: 15.8 % — ABNORMAL HIGH (ref 11.5–15.5)
WBC: 5.1 10*3/uL (ref 4.0–10.5)
nRBC: 0 % (ref 0.0–0.2)

## 2021-10-23 LAB — COMPREHENSIVE METABOLIC PANEL
ALT: 12 U/L (ref 0–44)
AST: 19 U/L (ref 15–41)
Albumin: 3.9 g/dL (ref 3.5–5.0)
Alkaline Phosphatase: 44 U/L (ref 38–126)
Anion gap: 9 (ref 5–15)
BUN: 31 mg/dL — ABNORMAL HIGH (ref 8–23)
CO2: 31 mmol/L (ref 22–32)
Calcium: 10 mg/dL (ref 8.9–10.3)
Chloride: 100 mmol/L (ref 98–111)
Creatinine, Ser: 2.2 mg/dL — ABNORMAL HIGH (ref 0.44–1.00)
GFR, Estimated: 24 mL/min — ABNORMAL LOW (ref >60)
Glucose, Bld: 161 mg/dL — ABNORMAL HIGH (ref 70–99)
Potassium: 3.8 mmol/L (ref 3.5–5.1)
Sodium: 140 mmol/L (ref 135–145)
Total Bilirubin: 0.5 mg/dL (ref 0.3–1.2)
Total Protein: 7.4 g/dL (ref 6.5–8.1)

## 2021-10-23 LAB — URINALYSIS, MICROSCOPIC (REFLEX)

## 2021-10-23 LAB — OCCULT BLOOD X 1 CARD TO LAB, STOOL: Fecal Occult Bld: NEGATIVE

## 2021-10-23 NOTE — ED Triage Notes (Signed)
Pt arrives pov with driver, ambulatory to triage with c/o dizziness x "several months". Pt endorses congestion. Pt denies HA. Pt AOx4 Pt endorses "several visits to different doctors with no answers"

## 2021-10-23 NOTE — ED Provider Notes (Addendum)
[facility] Provider Note   CSN: [id number] Arrival date & time: 10/23/21  1126     History Chief Complaint  Patient presents with   Dizziness    Veronica Simmons is a 70 y.o. female.  Patient with history of hypertension, diabetes, CKD presents today with complaint of dizziness.  She states that same has been occurring intermittently for the past 2 to 3 months with worsening in the last 3 weeks.  She describes the dizziness as "feeling like she is going to fall."  She is ambulatory without assistance and denies any falls.  She denies feeling like the room is spinning or any double vision.  States that symptoms are only present when she is standing.  She has been seen by her primary care for the symptoms who gave her meclizine which provided some relief.  Of note, patient noted to be hypertensive in the ER today, she states that she has not been checking her blood pressure at home.  Additionally, she endorses longstanding history of anemia due to CKD for which she has required monthly iron infusions.  She is 2 weeks late from receiving her infusion due to not calling to make an appointment.  States she plans to make an appointment tomorrow.  She denies any associated fevers, chills, nausea, vomiting.  The history is provided by the patient. No language interpreter was used.  Dizziness Associated symptoms: no chest pain, no headaches, no shortness of breath and no weakness       Past Medical History:  Diagnosis Date   ALLERGIC RHINITIS 02/19/2007   Qualifier: Diagnosis of  By: Lorne Skeens MD, VALENICA     Allergy    seasonal   Anemia 1970s   on iron    Arthritis    Cubital tunnel syndrome on right 2020   Secondary to old fracture. Evaluated by ortho. [facility] offered surgery but patient refused. Numbness and burning pain 4th and 5th digit.     Daytime sleepiness    Diabetes mellitus 2000   Never on insulin    High cholesterol     Hypertension 2000   Leg cramps     Patient Active Problem List   Diagnosis Date Noted   Symptomatic anemia 08/13/2020   Generalized weakness 08/13/2020   Pneumonia due to COVID-19 virus 08/03/2020   Acute respiratory failure with hypoxemia ([place]) 08/03/2020   AKI (acute kidney injury) ([place]) 08/03/2020   Hyponatremia 08/03/2020   New onset of congestive heart failure ([place]) 10/17/2019   Hypertensive urgency 10/17/2019   Right sided weakness 03/07/2018   HTN (hypertension) 03/07/2018   Obesity (BMI 30-39.9) 03/07/2018   Skin papules, generalized 08/04/2016   Skin lesion of right lower limb 06/19/2016   Leg swelling 05/12/2016   Subconjunctival hemorrhage of right eye 04/27/2016   Diarrhea 01/09/2016   Health care maintenance 06/28/2015   Anemia 12/21/2014   Palpitations 01/07/2014   Recurrent yeast vaginitis 11/05/2013   Recurrent genital herpes 07/17/2013   Hot flashes not due to menopause 11/27/2012   Cubital tunnel syndrome on right 10/31/2012   Type 2 diabetes, controlled, 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     Family History  Problem Relation Age of Onset   Early death Mother 43       shot    Heart disease Father 54   Diabetes Daughter    Kidney disease Daughter        on dialysis    Colon polyps Daughter    Diabetes Maternal Aunt    Cancer Paternal Uncle        Breast Cancer    Breast cancer Maternal Grandmother    Colon cancer Neg Hx    Esophageal cancer Neg Hx    Stomach cancer Neg Hx    Rectal cancer Neg Hx     Social History   Tobacco Use   Smoking status: Never   Smokeless tobacco: Never   Vaping Use   Vaping Use: Never used  Substance Use Topics   Alcohol use: No   Drug use: No    Home Medications Prior to Admission medications   Medication Sig Start Date End Date Taking? Authorizing Provider  amLODipine (NORVASC) 10 MG tablet Take 1 tablet (10 mg total) by mouth daily. 10/18/19   Bonnell Public, MD  baclofen (LIORESAL) 10 MG tablet  06/13/21   [provider]  benzonatate (TESSALON) 100 MG capsule Take 1 capsule (100 mg total) by mouth every 8 (eight) hours. 05/08/21   Deno Etienne, DO  calcitRIOL (ROCALTROL) 0.25 MCG capsule Take 0.25 mcg by mouth daily. 08/27/20   [provider]  carvedilol (COREG) 25 MG tablet Take 25 mg by mouth 2 (two) times daily. 07/20/20   [provider]  Coenzyme Q10 (COQ10 PO) Take 1 tablet by mouth daily.    [provider]  ferrous sulfate 325 (65 FE) MG tablet Take 1 tablet by mouth daily.    [provider]  fluticasone Asencion Islam) 50 MCG/ACT nasal spray  06/13/21   [provider]  FOLIC ACID PO Take by mouth.    [provider]  furosemide (LASIX) 80 MG tablet Take 1 tablet (80 mg total) by mouth daily. 08/14/20 09/06/21  Ghimire, Henreitta Leber, MD  glucose blood (ACCU-CHEK AVIVA PLUS) test strip Use as instructed Patient taking differently: 1 each by Other route as directed. Use as instructed 01/06/16   Haney, Yetta Flock A, MD  glucose blood (ACCU-CHEK SMARTVIEW) test strip Test blood sugar once daily Patient taking differently: 1 each by Other route See admin instructions. Test blood sugar once daily 10/12/16   Haney, Alyssa A, MD  glucose blood test strip Use as instructed Patient taking differently: 1 each by Other route as directed. Use as instructed 12/29/15   Marina Goodell A, MD  hydrALAZINE (APRESOLINE) 25 MG tablet Take 1 tablet (25 mg total) by mouth every 8 (eight) hours. 08/09/20 09/06/21  Barb Merino, MD  insulin glargine, 2 Unit Dial, (TOUJEO MAX SOLOSTAR) 300 UNIT/ML Solostar  Pen Inject into the skin. 06/02/21   [provider]  insulin lispro (HUMALOG) 100 UNIT/ML KwikPen Inject 0.01-0.09 mLs (1-9 Units total) into the skin 3 (three) times daily before meals. Blood sugars 70-120: no Insulin 121-150: 2 units 151-200: 3 units 201-250: 4 units 251-300: 5 units 301-350: 6 units 351-400: 9 units More than 400 use 9 units and call your doctor 08/09/20   Barb Merino, MD  Iron-Vitamin C 100-250 MG TABS Take by mouth.    [provider]  Lancets [facility] ULTRASOFT) lancets Use as instructed Patient taking differently: 1 each by Other route  as directed. Use as instructed 08/27/15   Marina Goodell A, MD  losartan (COZAAR) 25 MG tablet Take 25 mg by mouth daily. 10/05/20   [provider]  PRESCRIPTION MEDICATION IRON INFUSIONS as needed. [facility]).    [provider]  rOPINIRole (REQUIP) 0.5 MG tablet TAKE 1 TABLET(0.5 MG) BY MOUTH AT BEDTIME 09/06/21   [provider]  traZODone (DESYREL) 100 MG tablet Take 200 mg by mouth at bedtime as needed for sleep.  03/18/20   [provider]    Allergies    Levofloxacin  Review of Systems   Review of Systems  Constitutional:  Negative for chills and fever.  HENT:  Negative for congestion, rhinorrhea and sinus pain.   Eyes:  Negative for pain and visual disturbance.  Respiratory:  Negative for shortness of breath.   Cardiovascular:  Negative for chest pain and leg swelling.  Neurological:  Positive for dizziness. Negative for tremors, seizures, syncope, facial asymmetry, speech difficulty, weakness, light-headedness, numbness and headaches.  Psychiatric/Behavioral:  Negative for confusion and decreased concentration.   All other systems reviewed and are negative.  Physical Exam Updated Vital Signs BP (!) 177/71   Pulse 64   Temp 98.2 F (36.8 C) (Oral)   Resp 18   Ht 5\' 5"  (1.651 m)   Wt 109.3 kg   SpO2 94%   BMI 40.10 kg/m   Physical Exam Vitals and nursing note  reviewed.  Constitutional:      General: She is not in acute distress.    Appearance: Normal appearance. She is normal weight. She is not ill-appearing, toxic-appearing or diaphoretic.  HENT:     Head: Normocephalic and atraumatic.     Right Ear: Tympanic membrane, ear canal and external ear normal.     Left Ear: Tympanic membrane, ear canal and external ear normal.     Nose: Nose normal.     Mouth/Throat:     Mouth: Mucous membranes are moist.  Eyes:     Extraocular Movements: Extraocular movements intact.     Conjunctiva/sclera: Conjunctivae normal.     Pupils: Pupils are equal, round, and reactive to light.  Cardiovascular:     Rate and Rhythm: Normal rate and regular rhythm.     Heart sounds: Normal heart sounds.  Pulmonary:     Effort: Pulmonary effort is normal.     Breath sounds: Normal breath sounds.  Abdominal:     General: Abdomen is flat. Bowel sounds are normal.     Palpations: Abdomen is soft.  Musculoskeletal:        General: Normal range of motion.     Cervical back: Normal range of motion.  Skin:    General: Skin is warm and dry.  Neurological:     General: No focal deficit present.     Mental Status: She is alert and oriented to person, place, and time.     GCS: GCS eye subscore is 4. GCS verbal subscore is 5. GCS motor subscore is 6.     Cranial Nerves: No cranial nerve deficit.     Sensory: Sensation is intact. No sensory deficit.     Motor: Motor function is intact. No weakness.     Coordination: Coordination is intact. Coordination normal.     Gait: Gait is intact. Gait normal.     Comments: Alert and oriented to self, place, time and event.    Speech is fluent, clear without dysarthria or dysphasia.    Strength 5/5 in upper/lower extremities  Sensation intact in upper/lower extremities    CN I not tested  CN II grossly intact visual fields bilaterally. Did not visualize posterior eye.  CN III, IV, VI PERRLA and EOMs intact bilaterally  CN V  Intact sensation to sharp and light touch to the face  CN VII facial movements symmetric  CN VIII not tested  CN IX, X no uvula deviation, symmetric rise of soft palate  CN XI 5/5 SCM and trapezius strength bilaterally  CN XII Midline tongue protrusion, symmetric L/R movements   Psychiatric:        Mood and Affect: Mood normal.        Behavior: Behavior normal.    ED Results / Procedures / Treatments   Labs (all labs ordered are listed, but only abnormal results are displayed) Labs Reviewed  COMPREHENSIVE METABOLIC PANEL - Abnormal; Notable for the following components:      Result Value   Glucose, Bld 161 (*)    BUN 31 (*)    Creatinine, Ser 2.20 (*)    GFR, Estimated 24 (*)    All other components within normal limits  CBC - Abnormal; Notable for the following components:   RBC 3.29 (*)    Hemoglobin 9.3 (*)    HCT 30.6 (*)    RDW 15.8 (*)    All other components within normal limits  URINALYSIS, ROUTINE W REFLEX MICROSCOPIC - Abnormal; Notable for the following components:   Hgb urine dipstick TRACE (*)    Protein, ur >300 (*)    All other components within normal limits  URINALYSIS, MICROSCOPIC (REFLEX) - Abnormal; Notable for the following components:   Bacteria, UA RARE (*)    All other components within normal limits  OCCULT BLOOD X 1 CARD TO LAB, STOOL  CBG MONITORING, ED  POC OCCULT BLOOD, ED    EKG None  Radiology CT Head Wo Contrast  Result Date: 10/23/2021 CLINICAL DATA:  Dizziness EXAM: CT HEAD WITHOUT CONTRAST TECHNIQUE: Contiguous axial images were obtained from the base of the skull through the vertex without intravenous contrast. COMPARISON:  CT head 03/07/2018 FINDINGS: Brain: Mild generalized atrophy.  Mild white matter hypodensity. Negative for acute infarct, hemorrhage, mass Vascular: Negative for hyperdense vessel Skull: Negative Sinuses/Orbits: Negative Other: None IMPRESSION: No acute abnormality. Mild atrophy and mild chronic microvascular  ischemic change in the white matter. Electronically Signed   By: Franchot Gallo M.D.   On: 10/23/2021 15:50    Procedures Procedures   Medications Ordered in ED Medications - No data to display  ED Course  I have reviewed the triage vital signs and the nursing notes.  Pertinent labs & imaging results that were available during my care of the patient were reviewed by me and considered in my medical decision making (see chart for details).    MDM Rules/Calculators/A&P                         Patient presents today with several months of dizziness. Vitals remarkable for moderate hypertension. UA reveals significant proteinuria which is baseline for her.  No electrolyte abnormalities, kidney function at baseline for patient.  No leukocytosis, it does appear that the patient's hemoglobin is dropped 2 points in the last 3 weeks.  Hemoccult negative.  Upon chart review, it appears that patient has chronic anemia due to her CKD and receives monthly iron infusions.  She missed her infusion last month which is likely the cause of her anemia.  Discussed this with patient who plans to call tomorrow to set up an appointment for an infusion.  Emphasized the importance of this and how it could potentially be playing into her symptoms.  Patient with normal neuro exam and normal CT head imaging.  Unable to produce nystagmus on exam.  Was able to ambulate patient who walked throughout her room any difficulty and with normal gait.  Patient states that his symptoms have been ongoing for several months with some worsening for the last 3 weeks.  She states that her primary care gave her meclizine which provided some relief.  Therefore, I have given her instructions for Epley maneuvers to try at home.  I also suspect that her symptoms could be worsened by the her hypertension and anemia.  Educated patient on the same who is understanding.  Will provide neurology referral and recommend that she see her PCP in the interim  for further management.  Given normal work-up with benign neuro exam and longstanding duration of symptoms, feel that patient can be managed on an outpatient basis and is cleared for discharge at this time.  Patient amenable with plan of discharge, educated on red flag symptoms that would prompt immediate return.  Patient discharged in stable condition.   This is a shared visit with supervising physician Dr. Rogene Houston who has independently evaluated patient & provided guidance in evaluation/management/disposition, in agreement with care    Final Clinical Impression(s) / ED Diagnoses Final diagnoses:  Dizziness    Rx / DC Orders ED Discharge Orders     None     An After Visit Summary was printed and given to the patient.    Nestor Lewandowsky 10/23/21 [place], Leary Roca, PA-C 10/23/21 1842    Fredia Sorrow, MD 11/04/21 [phone]

## 2021-10-23 NOTE — ED Notes (Signed)
Pt transported to CT ?

## 2021-10-23 NOTE — Discharge Instructions (Addendum)
You were seen today for evaluation of your dizziness.  You had a reassuring work-up unrevealing for acute findings.  I have some suspicion that your symptoms may be due to a condition known as vertigo.  Therefore, I would like for you to attempt Epley maneuvers at home.  Please look up a video on YouTube of how to do these and attempt at home.  You may also get meclizine over-the-counter at your local pharmacy as it seems you have had some success with this in the past.  Additionally, your blood pressure was relatively high in the emergency department today.  This can sometimes happen as the emergency department tends to be high stress and therefore increase your blood pressure in the short-term.  However, it is very important that you monitor this at home as high blood pressure could definitely cause you to feel dizzy.   Also, your hemoglobin has dropped slightly since your last blood count.  It appears that you have a history of this happening and have required scheduled iron infusions for this are due for another one.  Please schedule this appointment ASAP as anemia can also cause dizziness.  Furthermore, I will include a referral to neurology for additional work-up as this seems it has been a longstanding problem refractory to other interventions.  Please call them to make an appointment.  Return if development of new or worsening symptoms.

## 2021-10-25 ENCOUNTER — Encounter (HOSPITAL_COMMUNITY): Payer: Medicare Other

## 2021-11-04 ENCOUNTER — Other Ambulatory Visit: Payer: Self-pay

## 2021-11-04 ENCOUNTER — Ambulatory Visit (HOSPITAL_COMMUNITY)
Admission: RE | Admit: 2021-11-04 | Discharge: 2021-11-04 | Disposition: A | Discharge status: Home / Self Care | Payer: Medicare Other | Source: Ambulatory Visit | Attending: Nephrology | Admitting: Nephrology

## 2021-11-04 VITALS — BP 179/82 | HR 87 | Temp 97.2°F | Resp 18

## 2021-11-04 DIAGNOSIS — N183 Chronic kidney disease, stage 3 unspecified: Secondary | ICD-10-CM | POA: Diagnosis present

## 2021-11-04 DIAGNOSIS — D631 Anemia in chronic kidney disease: Secondary | ICD-10-CM | POA: Insufficient documentation

## 2021-11-04 LAB — IRON AND TIBC
Iron: 74 ug/dL (ref 28–170)
Saturation Ratios: 24 % (ref 10.4–31.8)
TIBC: 305 ug/dL (ref 250–450)
UIBC: 231 ug/dL

## 2021-11-04 LAB — POCT HEMOGLOBIN-HEMACUE: Hemoglobin: 10.7 g/dL — ABNORMAL LOW (ref 12.0–15.0)

## 2021-11-04 LAB — FERRITIN: Ferritin: 493 ng/mL — ABNORMAL HIGH (ref 11–307)

## 2021-11-04 MED ORDER — EPOETIN ALFA-EPBX 10000 UNIT/ML IJ SOLN
15000.0000 [IU] | INTRAMUSCULAR | Status: DC
  Administered 2021-11-04: 15000 [IU] via SUBCUTANEOUS

## 2021-11-04 MED ORDER — EPOETIN ALFA-EPBX 10000 UNIT/ML IJ SOLN
INTRAMUSCULAR | Status: AC
  Filled 2021-11-04: qty 2

## 2021-11-07 IMAGING — DX DG CHEST 1V PORT
1 series · 1 of 1 positions shown · non-contrast
Comparison: October 16, 2019

CLINICAL DATA: Chills and cough

EXAM:
PORTABLE CHEST 1 VIEW

[chest ap]
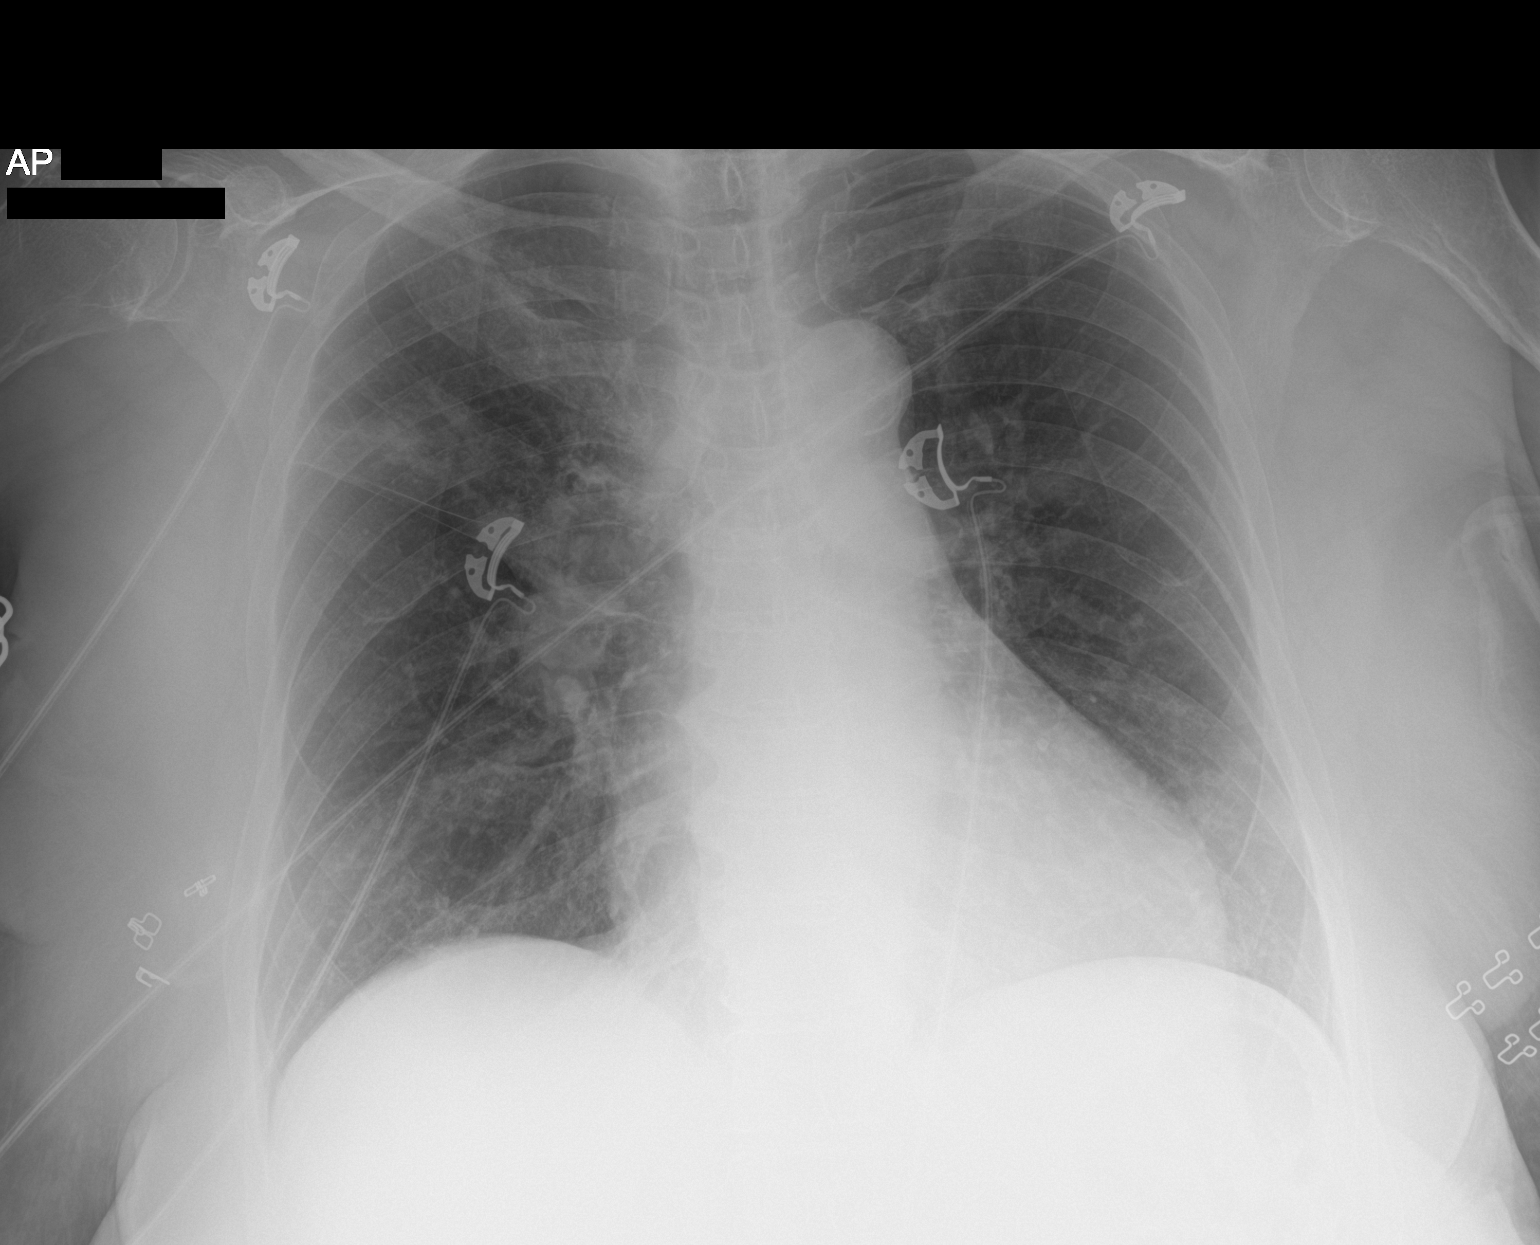

[1 of 1 positions shown; findings below may reference images not displayed]

FINDINGS: The heart size and mediastinal contours are borderline enlarged.
Patchy airspace opacity is seen within the right upper lung. The
left lung is clear. No pleural effusion. Aortic knob calcifications
are seen. The visualized skeletal structures are unremarkable.
IMPRESSION: Hazy airspace opacity in the right upper lung which may be due to
atelectasis and/or infectious etiology.

## 2021-11-14 ENCOUNTER — Other Ambulatory Visit: Payer: Self-pay | Admitting: Student

## 2021-11-14 DIAGNOSIS — Z1231 Encounter for screening mammogram for malignant neoplasm of breast: Secondary | ICD-10-CM

## 2021-11-18 ENCOUNTER — Other Ambulatory Visit: Payer: Self-pay

## 2021-11-18 ENCOUNTER — Encounter (HOSPITAL_COMMUNITY)
Admission: RE | Admit: 2021-11-18 | Discharge: 2021-11-18 | Disposition: A | Discharge status: Home / Self Care | Payer: Medicare Other | Source: Ambulatory Visit | Attending: Nephrology | Admitting: Nephrology

## 2021-11-18 VITALS — BP 130/66 | HR 80 | Resp 19

## 2021-11-18 DIAGNOSIS — D631 Anemia in chronic kidney disease: Secondary | ICD-10-CM | POA: Insufficient documentation

## 2021-11-18 DIAGNOSIS — N183 Chronic kidney disease, stage 3 unspecified: Secondary | ICD-10-CM | POA: Diagnosis not present

## 2021-11-18 LAB — POCT HEMOGLOBIN-HEMACUE: Hemoglobin: 9.9 g/dL — ABNORMAL LOW (ref 12.0–15.0)

## 2021-11-18 MED ORDER — EPOETIN ALFA-EPBX 10000 UNIT/ML IJ SOLN
INTRAMUSCULAR | Status: AC
  Administered 2021-11-18: 15000 [IU] via SUBCUTANEOUS
  Filled 2021-11-18: qty 2

## 2021-11-18 MED ORDER — EPOETIN ALFA-EPBX 10000 UNIT/ML IJ SOLN: 15000.0000 [IU] | INTRAMUSCULAR | Status: DC

## 2021-11-23 ENCOUNTER — Ambulatory Visit
Admission: RE | Admit: 2021-11-23 | Discharge: 2021-11-23 | Disposition: A | Discharge status: Home / Self Care | Payer: Medicare Other | Source: Ambulatory Visit | Attending: Student | Admitting: Student

## 2021-11-23 ENCOUNTER — Other Ambulatory Visit: Payer: Self-pay | Admitting: Student

## 2021-11-23 ENCOUNTER — Other Ambulatory Visit: Payer: Self-pay

## 2021-11-23 DIAGNOSIS — R609 Edema, unspecified: Secondary | ICD-10-CM

## 2021-11-23 DIAGNOSIS — R52 Pain, unspecified: Secondary | ICD-10-CM

## 2021-12-01 ENCOUNTER — Other Ambulatory Visit (HOSPITAL_COMMUNITY): Payer: Self-pay

## 2021-12-02 ENCOUNTER — Encounter (HOSPITAL_COMMUNITY)
Admission: RE | Admit: 2021-12-02 | Discharge: 2021-12-02 | Disposition: A | Discharge status: Home / Self Care | Payer: Medicare Other | Source: Ambulatory Visit | Attending: Nephrology | Admitting: Nephrology

## 2021-12-02 ENCOUNTER — Other Ambulatory Visit: Payer: Self-pay

## 2021-12-02 VITALS — BP 166/70 | HR 72 | Temp 97.4°F | Resp 18

## 2021-12-02 DIAGNOSIS — D631 Anemia in chronic kidney disease: Secondary | ICD-10-CM

## 2021-12-02 DIAGNOSIS — N183 Chronic kidney disease, stage 3 unspecified: Secondary | ICD-10-CM | POA: Diagnosis not present

## 2021-12-02 LAB — FERRITIN: Ferritin: 387 ng/mL — ABNORMAL HIGH (ref 11–307)

## 2021-12-02 LAB — IRON AND TIBC
Iron: 72 ug/dL (ref 28–170)
Saturation Ratios: 26 % (ref 10.4–31.8)
TIBC: 279 ug/dL (ref 250–450)
UIBC: 207 ug/dL

## 2021-12-02 LAB — POCT HEMOGLOBIN-HEMACUE: Hemoglobin: 10 g/dL — ABNORMAL LOW (ref 12.0–15.0)

## 2021-12-02 MED ORDER — EPOETIN ALFA-EPBX 10000 UNIT/ML IJ SOLN
INTRAMUSCULAR | Status: AC
  Filled 2021-12-02: qty 2

## 2021-12-02 MED ORDER — EPOETIN ALFA-EPBX 10000 UNIT/ML IJ SOLN
15000.0000 [IU] | INTRAMUSCULAR | Status: DC
  Administered 2021-12-02: 15000 [IU] via SUBCUTANEOUS

## 2021-12-16 ENCOUNTER — Other Ambulatory Visit: Payer: Self-pay

## 2021-12-16 ENCOUNTER — Encounter (HOSPITAL_COMMUNITY)
Admission: RE | Admit: 2021-12-16 | Discharge: 2021-12-16 | Disposition: A | Discharge status: Home / Self Care | Payer: Medicare Other | Source: Ambulatory Visit | Attending: Nephrology | Admitting: Nephrology

## 2021-12-16 ENCOUNTER — Ambulatory Visit: Payer: Medicare Other

## 2021-12-16 VITALS — BP 181/74 | HR 81 | Temp 96.8°F | Resp 19

## 2021-12-16 DIAGNOSIS — N183 Chronic kidney disease, stage 3 unspecified: Secondary | ICD-10-CM | POA: Diagnosis not present

## 2021-12-16 DIAGNOSIS — D631 Anemia in chronic kidney disease: Secondary | ICD-10-CM

## 2021-12-16 LAB — POCT HEMOGLOBIN-HEMACUE: Hemoglobin: 10.2 g/dL — ABNORMAL LOW (ref 12.0–15.0)

## 2021-12-16 MED ORDER — EPOETIN ALFA-EPBX 10000 UNIT/ML IJ SOLN
15000.0000 [IU] | INTRAMUSCULAR | Status: DC
  Administered 2021-12-16: 11:00:00 15000 [IU] via SUBCUTANEOUS

## 2021-12-16 MED ORDER — EPOETIN ALFA-EPBX 10000 UNIT/ML IJ SOLN
INTRAMUSCULAR | Status: AC
  Filled 2021-12-16: qty 2

## 2021-12-16 MED ORDER — SODIUM CHLORIDE 0.9 % IV SOLN
510.0000 mg | INTRAVENOUS | Status: DC
  Administered 2021-12-16: 11:00:00 510 mg via INTRAVENOUS
  Filled 2021-12-16: qty 510

## 2021-12-23 ENCOUNTER — Encounter (HOSPITAL_COMMUNITY): Payer: Self-pay

## 2021-12-30 ENCOUNTER — Other Ambulatory Visit: Payer: Self-pay

## 2021-12-30 ENCOUNTER — Ambulatory Visit (HOSPITAL_COMMUNITY)
Admission: RE | Admit: 2021-12-30 | Discharge: 2021-12-30 | Disposition: A | Discharge status: Home / Self Care | Payer: 59 | Source: Ambulatory Visit | Attending: Nephrology | Admitting: Nephrology

## 2021-12-30 VITALS — BP 160/73 | HR 75 | Temp 97.3°F | Resp 20 | Wt 231.0 lb

## 2021-12-30 DIAGNOSIS — N183 Chronic kidney disease, stage 3 unspecified: Secondary | ICD-10-CM | POA: Diagnosis present

## 2021-12-30 DIAGNOSIS — D631 Anemia in chronic kidney disease: Secondary | ICD-10-CM | POA: Insufficient documentation

## 2021-12-30 LAB — POCT HEMOGLOBIN-HEMACUE: Hemoglobin: 11.1 g/dL — ABNORMAL LOW (ref 12.0–15.0)

## 2021-12-30 MED ORDER — EPOETIN ALFA-EPBX 10000 UNIT/ML IJ SOLN
INTRAMUSCULAR | Status: AC
  Filled 2021-12-30: qty 2

## 2021-12-30 MED ORDER — SODIUM CHLORIDE 0.9 % IV SOLN
510.0000 mg | INTRAVENOUS | Status: AC
  Administered 2021-12-30: 510 mg via INTRAVENOUS
  Filled 2021-12-30: qty 510

## 2021-12-30 MED ORDER — EPOETIN ALFA-EPBX 10000 UNIT/ML IJ SOLN
15000.0000 [IU] | INTRAMUSCULAR | Status: DC
  Administered 2021-12-30: 15000 [IU] via SUBCUTANEOUS

## 2022-01-10 ENCOUNTER — Ambulatory Visit: Payer: Medicare Other

## 2022-01-13 ENCOUNTER — Ambulatory Visit (HOSPITAL_COMMUNITY)
Admission: RE | Admit: 2022-01-13 | Discharge: 2022-01-13 | Disposition: A | Discharge status: Home / Self Care | Payer: 59 | Source: Ambulatory Visit | Attending: Nephrology | Admitting: Nephrology

## 2022-01-13 ENCOUNTER — Other Ambulatory Visit: Payer: Self-pay

## 2022-01-13 VITALS — BP 140/69 | HR 83 | Temp 97.4°F | Resp 18

## 2022-01-13 DIAGNOSIS — D631 Anemia in chronic kidney disease: Secondary | ICD-10-CM | POA: Insufficient documentation

## 2022-01-13 DIAGNOSIS — N183 Chronic kidney disease, stage 3 unspecified: Secondary | ICD-10-CM | POA: Diagnosis not present

## 2022-01-13 LAB — IRON AND TIBC
Iron: 86 ug/dL (ref 28–170)
Saturation Ratios: 28 % (ref 10.4–31.8)
TIBC: 302 ug/dL (ref 250–450)
UIBC: 216 ug/dL

## 2022-01-13 LAB — POCT HEMOGLOBIN-HEMACUE: Hemoglobin: 11 g/dL — ABNORMAL LOW (ref 12.0–15.0)

## 2022-01-13 LAB — FERRITIN: Ferritin: 979 ng/mL — ABNORMAL HIGH (ref 11–307)

## 2022-01-13 MED ORDER — EPOETIN ALFA-EPBX 10000 UNIT/ML IJ SOLN
15000.0000 [IU] | INTRAMUSCULAR | Status: DC
  Administered 2022-01-13: 15000 [IU] via SUBCUTANEOUS

## 2022-01-13 MED ORDER — EPOETIN ALFA-EPBX 10000 UNIT/ML IJ SOLN
INTRAMUSCULAR | Status: AC
  Filled 2022-01-13: qty 2

## 2022-01-27 ENCOUNTER — Other Ambulatory Visit: Payer: Self-pay

## 2022-01-27 ENCOUNTER — Encounter (HOSPITAL_COMMUNITY)
Admission: RE | Admit: 2022-01-27 | Discharge: 2022-01-27 | Disposition: A | Discharge status: Home / Self Care | Payer: 59 | Source: Ambulatory Visit | Attending: Nephrology | Admitting: Nephrology

## 2022-01-27 VITALS — BP 173/70 | HR 77 | Temp 97.2°F | Resp 20

## 2022-01-27 DIAGNOSIS — N183 Chronic kidney disease, stage 3 unspecified: Secondary | ICD-10-CM | POA: Diagnosis present

## 2022-01-27 DIAGNOSIS — D631 Anemia in chronic kidney disease: Secondary | ICD-10-CM | POA: Diagnosis present

## 2022-01-27 LAB — POCT HEMOGLOBIN-HEMACUE: Hemoglobin: 11.3 g/dL — ABNORMAL LOW (ref 12.0–15.0)

## 2022-01-27 MED ORDER — EPOETIN ALFA-EPBX 10000 UNIT/ML IJ SOLN
INTRAMUSCULAR | Status: AC
  Filled 2022-01-27: qty 2

## 2022-01-27 MED ORDER — EPOETIN ALFA-EPBX 10000 UNIT/ML IJ SOLN
15000.0000 [IU] | INTRAMUSCULAR | Status: DC
  Administered 2022-01-27: 15000 [IU] via SUBCUTANEOUS

## 2022-01-31 ENCOUNTER — Emergency Department (HOSPITAL_COMMUNITY)
Admission: EM | Admit: 2022-01-31 | Discharge: 2022-01-31 | Disposition: A | Discharge status: Home / Self Care | Payer: 59 | Attending: Emergency Medicine | Admitting: Emergency Medicine

## 2022-01-31 ENCOUNTER — Encounter (HOSPITAL_COMMUNITY): Payer: Self-pay | Admitting: Emergency Medicine

## 2022-01-31 ENCOUNTER — Emergency Department (HOSPITAL_COMMUNITY): Payer: 59

## 2022-01-31 DIAGNOSIS — Z79899 Other long term (current) drug therapy: Secondary | ICD-10-CM | POA: Insufficient documentation

## 2022-01-31 DIAGNOSIS — Z7951 Long term (current) use of inhaled steroids: Secondary | ICD-10-CM | POA: Insufficient documentation

## 2022-01-31 DIAGNOSIS — Z794 Long term (current) use of insulin: Secondary | ICD-10-CM | POA: Insufficient documentation

## 2022-01-31 DIAGNOSIS — M25532 Pain in left wrist: Secondary | ICD-10-CM | POA: Diagnosis present

## 2022-01-31 DIAGNOSIS — M79642 Pain in left hand: Secondary | ICD-10-CM

## 2022-01-31 MED ORDER — HYDROCODONE-ACETAMINOPHEN 5-325 MG PO TABS
1.0000 | ORAL_TABLET | Freq: Once | ORAL | Status: AC
  Administered 2022-01-31: 1 via ORAL
  Filled 2022-01-31: qty 1

## 2022-01-31 MED ORDER — TRAMADOL HCL 50 MG PO TABS: 50.0000 mg | ORAL_TABLET | Freq: Four times a day (QID) | ORAL | 0 refills | Status: DC | PRN

## 2022-01-31 NOTE — ED Triage Notes (Signed)
Per patient, states left hand pain since last night-states sh injured it months ago but issue resolved-slight swelling-patient requesting pain meds

## 2022-01-31 NOTE — ED Provider Notes (Signed)
[facility] DEPT Provider Note   CSN: [id number] Arrival date & time: 01/31/22  1655     History  Chief Complaint  Patient presents with   Hand Pain    Veronica Simmons is a 71 y.o. female.  71 year old female presents today for evaluation of acute onset of left hand and wrist pain.  States the pain came on about 3 hours ago.  Denies injury or trauma.  States she initially injured her left hand in December following Christmas and had it evaluated by her PCP.  At that time she was told she had a hairline fracture that was only treated with pain medication with and without a splint.  States her pain improved until today.  The history is provided by the patient. No language interpreter was used.  Hand Pain      Home Medications Prior to Admission medications   Medication Sig Start Date End Date Taking? Authorizing Provider  amLODipine (NORVASC) 10 MG tablet Take 1 tablet (10 mg total) by mouth daily. 10/18/19   Dana Allan I, MD  atorvastatin (LIPITOR) 10 MG tablet Take 10 mg by mouth daily.    [provider]  baclofen (LIORESAL) 10 MG tablet  06/13/21   [provider]  benzonatate (TESSALON) 100 MG capsule Take 1 capsule (100 mg total) by mouth every 8 (eight) hours. 05/08/21   Deno Etienne, DO  calcitRIOL (ROCALTROL) 0.25 MCG capsule Take 0.25 mcg by mouth daily. 08/27/20   [provider]  carvedilol (COREG) 25 MG tablet Take 25 mg by mouth 2 (two) times daily. 07/20/20   [provider]  Coenzyme Q10 (COQ10 PO) Take 1 tablet by mouth daily.    [provider]  ferrous sulfate 325 (65 FE) MG tablet Take 1 tablet by mouth daily.    [provider]  fluticasone Asencion Islam) 50 MCG/ACT nasal spray  06/13/21   [provider]  FOLIC ACID PO Take by mouth.    [provider]  furosemide (LASIX) 80 MG tablet Take 1 tablet (80 mg total) by mouth daily. 08/14/20 09/06/21  Ghimire,  Henreitta Leber, MD  glucose blood (ACCU-CHEK AVIVA PLUS) test strip Use as instructed Patient taking differently: 1 each by Other route as directed. Use as instructed 01/06/16   Haney, Yetta Flock A, MD  glucose blood (ACCU-CHEK SMARTVIEW) test strip Test blood sugar once daily Patient taking differently: 1 each by Other route See admin instructions. Test blood sugar once daily 10/12/16   Haney, Alyssa A, MD  glucose blood test strip Use as instructed Patient taking differently: 1 each by Other route as directed. Use as instructed 12/29/15   Marina Goodell A, MD  hydrALAZINE (APRESOLINE) 25 MG tablet Take 1 tablet (25 mg total) by mouth every 8 (eight) hours. 08/09/20 09/06/21  Barb Merino, MD  insulin glargine, 2 Unit Dial, (TOUJEO MAX SOLOSTAR) 300 UNIT/ML Solostar Pen Inject into the skin. 06/02/21   [provider]  insulin lispro (HUMALOG) 100 UNIT/ML KwikPen Inject 0.01-0.09 mLs (1-9 Units total) into the skin 3 (three) times daily before meals. Blood sugars 70-120: no Insulin 121-150: 2 units 151-200: 3 units 201-250: 4 units 251-300: 5 units 301-350: 6 units 351-400: 9 units More than 400 use 9 units and call your doctor 08/09/20   Barb Merino, MD  Iron-Vitamin C 100-250 MG TABS Take by mouth.    [provider]  Lancets [facility] ULTRASOFT) lancets Use as instructed Patient taking differently: 1 each by Other route  as directed. Use as instructed 08/27/15   Marina Goodell A, MD  losartan (COZAAR) 25 MG tablet Take 25 mg by mouth daily. 10/05/20   [provider]  PRESCRIPTION MEDICATION IRON INFUSIONS as needed. [facility]).    [provider]  rOPINIRole (REQUIP) 0.5 MG tablet TAKE 1 TABLET(0.5 MG) BY MOUTH AT BEDTIME 09/06/21   [provider]  traZODone (DESYREL) 100 MG tablet Take 200 mg by mouth at bedtime as needed for sleep.  03/18/20   [provider]      Allergies    Levofloxacin    Review of Systems   Review of Systems  Musculoskeletal:   Positive for arthralgias. Negative for joint swelling.  Skin:  Negative for wound.  Neurological:  Negative for weakness and numbness.  All other systems reviewed and are negative.  Physical Exam Updated Vital Signs BP (!) 181/72 (BP Location: Right Arm)    Pulse 65    Temp 98 F (36.7 C) (Oral)    Resp 16    SpO2 98%  Physical Exam Vitals and nursing note reviewed.  Constitutional:      General: She is not in acute distress.    Appearance: Normal appearance. She is not ill-appearing.  HENT:     Head: Normocephalic and atraumatic.     Nose: Nose normal.  Eyes:     Conjunctiva/sclera: Conjunctivae normal.  Pulmonary:     Effort: Pulmonary effort is normal. No respiratory distress.  Musculoskeletal:        General: No deformity.     Comments: Left wrist with visible deformity.  Exquisite tenderness present over left wrist.  Tenderness to palpation of all digits and left hand.  Range of motion limited secondary to pain.  Tenderness present over mid left forearm.  Elbow without tenderness to palpation.  2+ radial pulse present.  Ulnar pulse present.  Skin:    Findings: No rash.  Neurological:     Mental Status: She is alert.    ED Results / Procedures / Treatments   Labs (all labs ordered are listed, but only abnormal results are displayed) Labs Reviewed - No data to display  EKG None  Radiology No results found.  Procedures Procedures    Medications Ordered in ED Medications  HYDROcodone-acetaminophen (NORCO/VICODIN) 5-325 MG per tablet 1 tablet (has no administration in time range)    ED Course/ Medical Decision Making/ A&P                           Medical Decision Making Amount and/or Complexity of Data Reviewed Radiology: ordered.  Risk Prescription drug management.   71 year old female presents today for evaluation of left hand pain onset 3 hours prior to arrival.  Patient denies any trauma.  Reports similar pain following Christmas that was also  nontraumatic.  X-ray without evidence of fracture.  Shows advanced arthritis.  Mild improvement with Norco.  Will provide brace for limitation of range of motion given that worsens her symptoms.  We will provide prescription for tramadol.  Discussed taking Tylenol for pain control.  Return precautions discussed.  Discussed importance of follow-up with her PCP.  Improvement in range of motion following pain medicine.  Patient voices understanding and is in agreement with plan.  Final Clinical Impression(s) / ED Diagnoses Final diagnoses:  Left hand pain    Rx / DC Orders ED Discharge Orders          Ordered  traMADol (ULTRAM) 50 MG tablet  Every 6 hours PRN        01/31/22 1903              Evlyn Courier, PA-C 01/31/22 1904    Drenda Freeze, MD 01/31/22 [phone]

## 2022-01-31 NOTE — Discharge Instructions (Addendum)
Your x-rays today were negative for fracture in your hand or wrist.  It did show evidence of advanced arthritis.  This could be the source of your pain.  Recommend you take arthritis strength Tylenol.  I have also given you prescription for tramadol you can use for breakthrough pain.  Recommend you follow-up with your primary care provider.  If you have worsening symptoms he can return to the emergency room for evaluation.  You are also given a brace for your left wrist.

## 2022-02-10 ENCOUNTER — Emergency Department (HOSPITAL_COMMUNITY)
Admission: EM | Admit: 2022-02-10 | Discharge: 2022-02-10 | Disposition: A | Discharge status: Home / Self Care | Payer: 59 | Attending: Emergency Medicine | Admitting: Emergency Medicine

## 2022-02-10 ENCOUNTER — Other Ambulatory Visit: Payer: Self-pay

## 2022-02-10 ENCOUNTER — Encounter (HOSPITAL_COMMUNITY): Payer: Self-pay

## 2022-02-10 ENCOUNTER — Encounter (HOSPITAL_BASED_OUTPATIENT_CLINIC_OR_DEPARTMENT_OTHER): Payer: Self-pay

## 2022-02-10 ENCOUNTER — Encounter (HOSPITAL_COMMUNITY)
Admission: RE | Admit: 2022-02-10 | Discharge: 2022-02-10 | Disposition: A | Discharge status: Home / Self Care | Payer: 59 | Source: Ambulatory Visit | Attending: Nephrology | Admitting: Nephrology

## 2022-02-10 ENCOUNTER — Emergency Department (HOSPITAL_BASED_OUTPATIENT_CLINIC_OR_DEPARTMENT_OTHER): Payer: 59

## 2022-02-10 ENCOUNTER — Emergency Department (HOSPITAL_BASED_OUTPATIENT_CLINIC_OR_DEPARTMENT_OTHER)
Admission: EM | Admit: 2022-02-10 | Discharge: 2022-02-10 | Disposition: A | Discharge status: Home / Self Care | Payer: 59 | Attending: Emergency Medicine | Admitting: Emergency Medicine

## 2022-02-10 VITALS — BP 153/75

## 2022-02-10 DIAGNOSIS — Z5321 Procedure and treatment not carried out due to patient leaving prior to being seen by health care provider: Secondary | ICD-10-CM | POA: Insufficient documentation

## 2022-02-10 DIAGNOSIS — M79601 Pain in right arm: Secondary | ICD-10-CM | POA: Diagnosis present

## 2022-02-10 DIAGNOSIS — M542 Cervicalgia: Secondary | ICD-10-CM | POA: Insufficient documentation

## 2022-02-10 DIAGNOSIS — Z79899 Other long term (current) drug therapy: Secondary | ICD-10-CM | POA: Diagnosis not present

## 2022-02-10 DIAGNOSIS — I10 Essential (primary) hypertension: Secondary | ICD-10-CM | POA: Diagnosis not present

## 2022-02-10 DIAGNOSIS — D631 Anemia in chronic kidney disease: Secondary | ICD-10-CM

## 2022-02-10 DIAGNOSIS — M25511 Pain in right shoulder: Secondary | ICD-10-CM | POA: Diagnosis not present

## 2022-02-10 DIAGNOSIS — E119 Type 2 diabetes mellitus without complications: Secondary | ICD-10-CM | POA: Diagnosis not present

## 2022-02-10 DIAGNOSIS — N183 Chronic kidney disease, stage 3 unspecified: Secondary | ICD-10-CM

## 2022-02-10 DIAGNOSIS — Z794 Long term (current) use of insulin: Secondary | ICD-10-CM | POA: Diagnosis not present

## 2022-02-10 DIAGNOSIS — M79642 Pain in left hand: Secondary | ICD-10-CM | POA: Insufficient documentation

## 2022-02-10 LAB — FERRITIN: Ferritin: 836 ng/mL — ABNORMAL HIGH (ref 11–307)

## 2022-02-10 LAB — IRON AND TIBC
Iron: 50 ug/dL (ref 28–170)
Saturation Ratios: 19 % (ref 10.4–31.8)
TIBC: 258 ug/dL (ref 250–450)
UIBC: 208 ug/dL

## 2022-02-10 LAB — POCT HEMOGLOBIN-HEMACUE: Hemoglobin: 11 g/dL — ABNORMAL LOW (ref 12.0–15.0)

## 2022-02-10 MED ORDER — OXYCODONE-ACETAMINOPHEN 5-325 MG PO TABS: 1.0000 | ORAL_TABLET | Freq: Four times a day (QID) | ORAL | 0 refills | Status: DC | PRN

## 2022-02-10 MED ORDER — KETOROLAC TROMETHAMINE 30 MG/ML IJ SOLN
30.0000 mg | Freq: Once | INTRAMUSCULAR | Status: AC
  Administered 2022-02-10: 30 mg via INTRAMUSCULAR
  Filled 2022-02-10: qty 1

## 2022-02-10 MED ORDER — HYDROMORPHONE HCL 1 MG/ML IJ SOLN
1.0000 mg | Freq: Once | INTRAMUSCULAR | Status: AC
  Administered 2022-02-10: 1 mg via INTRAMUSCULAR
  Filled 2022-02-10: qty 1

## 2022-02-10 MED ORDER — EPOETIN ALFA-EPBX 10000 UNIT/ML IJ SOLN: 15000.0000 [IU] | INTRAMUSCULAR | Status: DC

## 2022-02-10 MED ORDER — DEXAMETHASONE SODIUM PHOSPHATE 10 MG/ML IJ SOLN
10.0000 mg | Freq: Once | INTRAMUSCULAR | Status: AC
  Administered 2022-02-10: 10 mg via INTRAMUSCULAR
  Filled 2022-02-10: qty 1

## 2022-02-10 MED ORDER — EPOETIN ALFA-EPBX 10000 UNIT/ML IJ SOLN
INTRAMUSCULAR | Status: AC
  Administered 2022-02-10: 15000 [IU]
  Filled 2022-02-10: qty 2

## 2022-02-10 NOTE — ED Provider Notes (Signed)
[facility] EMERGENCY DEPARTMENT Provider Note   CSN: [id number] Arrival date & time: 02/10/22  1504     History  Chief Complaint  Patient presents with   Arm Pain    Veronica Simmons is a 71 y.o. female. With past medical history of HTN, diabetes, cubital tunnel, arthritis who presents to the emergency department with right arm pain.  States she has had progressively worsening right arm pain over the past 1 week. She describes the pain as severe and constant with episodes of sharp shooting pain down her right arm into her right wrist. She also states she is having right sided neck pain, worse with movement. She denies any trauma, falls, previous surgeries. She denies chest pain, shortness of breath. Denies erythema, warmth fevers.    Arm Pain      Home Medications Prior to Admission medications   Medication Sig Start Date End Date Taking? Authorizing Provider  oxyCODONE-acetaminophen (PERCOCET/ROXICET) 5-325 MG tablet Take 1 tablet by mouth every 6 (six) hours as needed for severe pain. 02/10/22  Yes Mickie Hillier, PA-C  amLODipine (NORVASC) 10 MG tablet Take 1 tablet (10 mg total) by mouth daily. 10/18/19   Dana Allan I, MD  atorvastatin (LIPITOR) 10 MG tablet Take 10 mg by mouth daily.    [provider]  baclofen (LIORESAL) 10 MG tablet  06/13/21   [provider]  benzonatate (TESSALON) 100 MG capsule Take 1 capsule (100 mg total) by mouth every 8 (eight) hours. 05/08/21   Deno Etienne, DO  calcitRIOL (ROCALTROL) 0.25 MCG capsule Take 0.25 mcg by mouth daily. 08/27/20   [provider]  carvedilol (COREG) 25 MG tablet Take 25 mg by mouth 2 (two) times daily. 07/20/20   [provider]  Coenzyme Q10 (COQ10 PO) Take 1 tablet by mouth daily.    [provider]  ferrous sulfate 325 (65 FE) MG tablet Take 1 tablet by mouth daily.    [provider]  fluticasone Asencion Islam) 50 MCG/ACT nasal spray  06/13/21    [provider]  FOLIC ACID PO Take by mouth.    [provider]  furosemide (LASIX) 80 MG tablet Take 1 tablet (80 mg total) by mouth daily. 08/14/20 09/06/21  Ghimire, Henreitta Leber, MD  glucose blood (ACCU-CHEK AVIVA PLUS) test strip Use as instructed Patient taking differently: 1 each by Other route as directed. Use as instructed 01/06/16   Haney, Yetta Flock A, MD  glucose blood (ACCU-CHEK SMARTVIEW) test strip Test blood sugar once daily Patient taking differently: 1 each by Other route See admin instructions. Test blood sugar once daily 10/12/16   Haney, Alyssa A, MD  glucose blood test strip Use as instructed Patient taking differently: 1 each by Other route as directed. Use as instructed 12/29/15   Marina Goodell A, MD  hydrALAZINE (APRESOLINE) 25 MG tablet Take 1 tablet (25 mg total) by mouth every 8 (eight) hours. 08/09/20 09/06/21  Barb Merino, MD  insulin glargine, 2 Unit Dial, (TOUJEO MAX SOLOSTAR) 300 UNIT/ML Solostar Pen Inject into the skin. 06/02/21   [provider]  insulin lispro (HUMALOG) 100 UNIT/ML KwikPen Inject 0.01-0.09 mLs (1-9 Units total) into the skin 3 (three) times daily before meals. Blood sugars 70-120: no Insulin 121-150: 2 units 151-200: 3 units 201-250: 4 units 251-300: 5 units 301-350: 6 units 351-400: 9 units More than 400 use 9 units and call your doctor 08/09/20   Barb Merino, MD  Iron-Vitamin C 100-250 MG TABS Take by  mouth.    [provider]  Lancets (ONETOUCH ULTRASOFT) lancets Use as instructed Patient taking differently: 1 each by Other route as directed. Use as instructed 08/27/15   Marina Goodell A, MD  losartan (COZAAR) 25 MG tablet Take 25 mg by mouth daily. 10/05/20   [provider]  PRESCRIPTION MEDICATION IRON INFUSIONS as needed. [facility]).    [provider]  rOPINIRole (REQUIP) 0.5 MG tablet TAKE 1 TABLET(0.5 MG) BY MOUTH AT BEDTIME 09/06/21   [provider]  traMADol (ULTRAM) 50 MG tablet Take  1 tablet (50 mg total) by mouth every 6 (six) hours as needed. 01/31/22   Evlyn Courier, PA-C  traZODone (DESYREL) 100 MG tablet Take 200 mg by mouth at bedtime as needed for sleep.  03/18/20   [provider]      Allergies    Levofloxacin    Review of Systems   Review of Systems  Musculoskeletal:  Positive for arthralgias and myalgias.  All other systems reviewed and are negative.  Physical Exam Updated Vital Signs BP (!) 151/59 (BP Location: Right Arm)    Pulse 80    Temp 97.8 F (36.6 C) (Oral)    Resp 18    SpO2 97%  Physical Exam Vitals and nursing note reviewed.  Constitutional:      General: She is in acute distress.     Appearance: Normal appearance.  HENT:     Head: Normocephalic and atraumatic.  Eyes:     General: No scleral icterus.    Extraocular Movements: Extraocular movements intact.  Cardiovascular:     Pulses: Normal pulses.  Pulmonary:     Effort: Pulmonary effort is normal. No respiratory distress.  Musculoskeletal:        General: Tenderness present. No swelling, deformity or signs of injury.     Right shoulder: Tenderness present. No swelling, deformity, effusion or bony tenderness. Decreased range of motion. Normal pulse.     Left shoulder: Normal.       Arms:     Cervical back: Neck supple. Tenderness present.     Right lower leg: No edema.     Left lower leg: No edema.     Comments: Significant decreased ROM to right arm secondary to pain. Limited passive ROM secondary to pain. Right trapezius tenderness to palpation. Right radial pulse 2+ . Sensation intact. No swelling, redness or warmth to right shoulder, elbow or wrist. Compartments soft. Full ROM right wrist.   Skin:    General: Skin is warm and dry.     Capillary Refill: Capillary refill takes less than 2 seconds.     Findings: No erythema or rash.  Neurological:     General: No focal deficit present.     Mental Status: She is alert and oriented to person, place, and time. Mental status  is at baseline.     Sensory: No sensory deficit.     Motor: No weakness.  Psychiatric:        Mood and Affect: Mood normal.        Behavior: Behavior normal.        Thought Content: Thought content normal.        Judgment: Judgment normal.    ED Results / Procedures / Treatments   Labs (all labs ordered are listed, but only abnormal results are displayed) Labs Reviewed - No data to display  EKG None Sinus Rhythm  Consider LAE   Radiology DG Shoulder Right  Result Date: 02/10/2022 CLINICAL  DATA:  Right shoulder pain and decreased range of motion for 2 weeks. No known injury. EXAM: RIGHT SHOULDER - 2+ VIEW COMPARISON:  11/07/2010 FINDINGS: There is no evidence of fracture or dislocation. Mild degenerative spurring is again seen involving the inferior glenohumeral joint. Mild degenerative spurring of the [facility] joint is increased since previous study. New soft tissue calcification is seen in the distal rotator cuff tendon, consistent with calcific tendinitis. IMPRESSION: No acute findings. Calcific tendinitis of the rotator cuff tendon. Mild glenohumeral and acromioclavicular degenerative changes. Electronically Signed   By: Marlaine Hind M.D.   On: 02/10/2022 18:22    Procedures Procedures    Medications Ordered in ED Medications  dexamethasone (DECADRON) injection 10 mg (10 mg Intramuscular Given 02/10/22 1730)  ketorolac (TORADOL) 30 MG/ML injection 30 mg (30 mg Intramuscular Given 02/10/22 1730)  HYDROmorphone (DILAUDID) injection 1 mg (1 mg Intramuscular Given 02/10/22 1849)    ED Course/ Medical Decision Making/ A&P                           Medical Decision Making Amount and/or Complexity of Data Reviewed Radiology: ordered.  Risk Prescription drug management.  Patient presents to the ED with complaints of right arm pain. This involves an extensive number of treatment options, and is a complaint that carries with it a low risk of complications and morbidity.   Additional  history obtained:  Additional history obtained from: daughter at bedside External records from outside source obtained and reviewed including: previous ED visits for similar complaint  EKG: EKG: normal EKG, normal sinus rhythm.   Imaging Studies ordered:  I ordered imaging studies which included x-ray.  I independently reviewed & interpreted imaging & am in agreement with radiology impression. Imaging shows: Calcific tendinitis of rotator cuff   Medications  I ordered medication including toradol, decadron, dilaudid  for pain Reevaluation of the patient after medication shows that patient improved  Tests Considered: CT shoulder   ED Course: 72 year old female who presents to the emergency department with right shoulder pain.   Differential includes fracture, dislocation, septic joint, gout, arthritis, DVT, adhesive capsulitis, tendon or ligamentous injury, ACS  Physical exam and history not consistent with fracture or dislocation given lack of trauma or reported history. No reported fevers, physical without redness, warmth, erythema so doubt septic arthritis, gonococcal arthropathy.Not consistent with gouty arthritis. No swelling, redness concerning for upper extremity DVT.  EKG without ischemia or infarction. No chest pain, SOB, palpitations concerning for ACS at this time.  X-ray with calcific tendinitis of the rotator cuff. Likely source of pain. Given toradol and decadron IM given initially with improvement in symptoms and ROM. She was redose with Dilaudid IM which further improved her symptoms and ROM. She is feeling much better.   Will given her prescription for short course or opioids and given opioid precaution education. Will give her orthopedic follow-up for ongoing management of symptoms. Given return precautions for septic arthritis, DVT. She verbalizes understanding.   After consideration of the diagnostic results and the patients response to treatment, I feel that the  patent would benefit from discharge. The patient has been appropriately medically screened and/or stabilized in the ED. I have low suspicion for any other emergent medical condition which would require further screening, evaluation or treatment in the ED or require inpatient management. The patient is overall well appearing and non-toxic in appearance. They are hemodynamically stable at time of discharge.   Final Clinical Impression(s) /  ED Diagnoses Final diagnoses:  Acute pain of right shoulder    Rx / DC Orders ED Discharge Orders          Ordered    oxyCODONE-acetaminophen (PERCOCET/ROXICET) 5-325 MG tablet  Every 6 hours PRN        02/10/22 1840              Mickie Hillier, PA-C 02/11/22 1935    Horton, Alvin Critchley, DO 02/13/22 1605

## 2022-02-10 NOTE — ED Notes (Signed)
Called x1, no response, not visualized in lobby.

## 2022-02-10 NOTE — ED Notes (Signed)
Patient transported to X-ray 

## 2022-02-10 NOTE — Discharge Instructions (Addendum)
You were seen in the emergency department today for right shoulder pain.  You have tendinitis.  I am prescribing you a short course of Percocet that you can take for severe pain.  Please try and take ibuprofen prior to trying Percocet.  Additionally can use ice or heat on the joint.  Please continue to do gentle range of motion and do not keep the shoulder immobilized this can cause a frozen shoulder.  Please call the orthopedic to set up an appointment for follow-up.

## 2022-02-10 NOTE — ED Triage Notes (Signed)
Patient c/o right arm pain and left hand pain x 1 week. Patient denies any injury.

## 2022-02-10 NOTE — ED Triage Notes (Addendum)
Pt c/o LEFT hand pain and RIGHT arm pain x ~2 weeks-denies injury-states she was seen for same 2/14 and dx with arthritis-[facility] ED today-NAD-steady gait

## 2022-02-10 NOTE — ED Notes (Signed)
ED Provider at bedside. 

## 2022-02-24 ENCOUNTER — Encounter (HOSPITAL_COMMUNITY): Payer: 59

## 2022-02-27 ENCOUNTER — Encounter (HOSPITAL_COMMUNITY)
Admission: RE | Admit: 2022-02-27 | Discharge: 2022-02-27 | Disposition: A | Discharge status: Home / Self Care | Payer: 59 | Source: Ambulatory Visit | Attending: Nephrology | Admitting: Nephrology

## 2022-02-27 VITALS — BP 166/75 | HR 83 | Temp 97.4°F | Resp 18

## 2022-02-27 DIAGNOSIS — D631 Anemia in chronic kidney disease: Secondary | ICD-10-CM | POA: Insufficient documentation

## 2022-02-27 DIAGNOSIS — N183 Chronic kidney disease, stage 3 unspecified: Secondary | ICD-10-CM | POA: Diagnosis present

## 2022-02-27 LAB — POCT HEMOGLOBIN-HEMACUE: Hemoglobin: 10.9 g/dL — ABNORMAL LOW (ref 12.0–15.0)

## 2022-02-27 MED ORDER — EPOETIN ALFA-EPBX 10000 UNIT/ML IJ SOLN
15000.0000 [IU] | INTRAMUSCULAR | Status: DC
  Administered 2022-02-27: 15000 [IU] via SUBCUTANEOUS

## 2022-02-27 MED ORDER — EPOETIN ALFA-EPBX 10000 UNIT/ML IJ SOLN
INTRAMUSCULAR | Status: AC
  Filled 2022-02-27: qty 2

## 2022-03-13 ENCOUNTER — Encounter (HOSPITAL_COMMUNITY)
Admission: RE | Admit: 2022-03-13 | Discharge: 2022-03-13 | Disposition: A | Discharge status: Home / Self Care | Payer: 59 | Source: Ambulatory Visit | Attending: Nephrology | Admitting: Nephrology

## 2022-03-13 ENCOUNTER — Other Ambulatory Visit: Payer: Self-pay

## 2022-03-13 VITALS — BP 152/66 | HR 94 | Temp 97.0°F | Resp 18

## 2022-03-13 DIAGNOSIS — D631 Anemia in chronic kidney disease: Secondary | ICD-10-CM

## 2022-03-13 DIAGNOSIS — N183 Chronic kidney disease, stage 3 unspecified: Secondary | ICD-10-CM | POA: Diagnosis not present

## 2022-03-13 LAB — IRON AND TIBC
Iron: 116 ug/dL (ref 28–170)
Saturation Ratios: 42 % — ABNORMAL HIGH (ref 10.4–31.8)
TIBC: 273 ug/dL (ref 250–450)
UIBC: 157 ug/dL

## 2022-03-13 LAB — FERRITIN: Ferritin: 652 ng/mL — ABNORMAL HIGH (ref 11–307)

## 2022-03-13 LAB — POCT HEMOGLOBIN-HEMACUE: Hemoglobin: 10.4 g/dL — ABNORMAL LOW (ref 12.0–15.0)

## 2022-03-13 MED ORDER — EPOETIN ALFA-EPBX 10000 UNIT/ML IJ SOLN
15000.0000 [IU] | INTRAMUSCULAR | Status: DC
  Administered 2022-03-13: 15000 [IU] via SUBCUTANEOUS

## 2022-03-13 MED ORDER — EPOETIN ALFA-EPBX 10000 UNIT/ML IJ SOLN
INTRAMUSCULAR | Status: AC
  Filled 2022-03-13: qty 2

## 2022-03-27 ENCOUNTER — Encounter (HOSPITAL_COMMUNITY): Payer: 59

## 2022-04-01 ENCOUNTER — Encounter (HOSPITAL_BASED_OUTPATIENT_CLINIC_OR_DEPARTMENT_OTHER): Payer: Self-pay

## 2022-04-01 ENCOUNTER — Emergency Department (HOSPITAL_BASED_OUTPATIENT_CLINIC_OR_DEPARTMENT_OTHER)
Admission: EM | Admit: 2022-04-01 | Discharge: 2022-04-01 | Disposition: A | Discharge status: Home / Self Care | Payer: 59 | Attending: Emergency Medicine | Admitting: Emergency Medicine

## 2022-04-01 ENCOUNTER — Other Ambulatory Visit: Payer: Self-pay

## 2022-04-01 DIAGNOSIS — G5602 Carpal tunnel syndrome, left upper limb: Secondary | ICD-10-CM | POA: Diagnosis not present

## 2022-04-01 DIAGNOSIS — Z794 Long term (current) use of insulin: Secondary | ICD-10-CM | POA: Diagnosis not present

## 2022-04-01 DIAGNOSIS — N189 Chronic kidney disease, unspecified: Secondary | ICD-10-CM | POA: Diagnosis not present

## 2022-04-01 DIAGNOSIS — M25561 Pain in right knee: Secondary | ICD-10-CM | POA: Diagnosis not present

## 2022-04-01 DIAGNOSIS — M25511 Pain in right shoulder: Secondary | ICD-10-CM | POA: Diagnosis not present

## 2022-04-01 MED ORDER — HYDROCODONE-ACETAMINOPHEN 5-325 MG PO TABS: 1.0000 | ORAL_TABLET | Freq: Four times a day (QID) | ORAL | 0 refills | Status: DC | PRN

## 2022-04-01 MED ORDER — PREDNISONE 20 MG PO TABS: ORAL_TABLET | ORAL | 0 refills | Status: DC

## 2022-04-01 MED ORDER — METHYLPREDNISOLONE SODIUM SUCC 125 MG IJ SOLR
125.0000 mg | Freq: Once | INTRAMUSCULAR | Status: AC
  Administered 2022-04-01: 125 mg via INTRAMUSCULAR
  Filled 2022-04-01: qty 2

## 2022-04-01 MED ORDER — HYDROCODONE-ACETAMINOPHEN 5-325 MG PO TABS
2.0000 | ORAL_TABLET | Freq: Once | ORAL | Status: AC
Start: 2022-04-01 — End: 2022-04-01
  Administered 2022-04-01: 2 via ORAL
  Filled 2022-04-01: qty 2

## 2022-04-01 NOTE — ED Triage Notes (Signed)
Pt arrives ambulatory to ED with c/o pain in her arms, both hands, and right leg states that she was seen a month ago and told she had tendonitis but reports on going pain since.  ?

## 2022-04-01 NOTE — ED Provider Notes (Signed)
?[facility] EMERGENCY DEPARTMENT ?Provider Note ? ? ?CSN: [id number] ?Arrival date & time: 04/01/22  1526 ? ?  ? ?History ? ?Chief Complaint  ?Patient presents with  ? Leg Pain  ? ? ?Veronica Simmons is a 71 y.o. female history of CKD here presenting with right shoulder pain and left wrist pain and right knee pain.  Patient was seen previously for similar symptoms and was diagnosed with tendinitis.  Patient finished a course of steroids which initially helped but the pain came back. She denies any trauma or injury. She states that she has not seen her hand doctor recently and does not have an orthopedic doctor. ? ?The history is provided by the patient.  ? ?  ? ?Home Medications ?Prior to Admission medications   ?Medication Sig Start Date End Date Taking? Authorizing Provider  ?amLODipine (NORVASC) 10 MG tablet Take 1 tablet (10 mg total) by mouth daily. 10/18/19   Bonnell Public, MD  ?atorvastatin (LIPITOR) 10 MG tablet Take 10 mg by mouth daily.    [provider]  ?baclofen (LIORESAL) 10 MG tablet  06/13/21   [provider]  ?benzonatate (TESSALON) 100 MG capsule Take 1 capsule (100 mg total) by mouth every 8 (eight) hours. 05/08/21   Deno Etienne, DO  ?calcitRIOL (ROCALTROL) 0.25 MCG capsule Take 0.25 mcg by mouth daily. 08/27/20   [provider]  ?carvedilol (COREG) 25 MG tablet Take 25 mg by mouth 2 (two) times daily. 07/20/20   [provider]  ?Coenzyme Q10 (COQ10 PO) Take 1 tablet by mouth daily.    [provider]  ?ferrous sulfate 325 (65 FE) MG tablet Take 1 tablet by mouth daily.    [provider]  ?fluticasone Asencion Islam) 50 MCG/ACT nasal spray  06/13/21   [provider]  ?FOLIC ACID PO Take by mouth.    [provider]  ?furosemide (LASIX) 80 MG tablet Take 1 tablet (80 mg total) by mouth daily. 08/14/20 09/06/21  Ghimire, Henreitta Leber, MD  ?glucose blood (ACCU-CHEK AVIVA PLUS) test strip Use as instructed ?Patient  taking differently: 1 each by Other route as directed. Use as instructed 01/06/16   Marina Goodell A, MD  ?glucose blood (ACCU-CHEK SMARTVIEW) test strip Test blood sugar once daily ?Patient taking differently: 1 each by Other route See admin instructions. Test blood sugar once daily 10/12/16   Haney, Alyssa A, MD  ?glucose blood test strip Use as instructed ?Patient taking differently: 1 each by Other route as directed. Use as instructed 12/29/15   Marina Goodell A, MD  ?hydrALAZINE (APRESOLINE) 25 MG tablet Take 1 tablet (25 mg total) by mouth every 8 (eight) hours. 08/09/20 09/06/21  Barb Merino, MD  ?insulin glargine, 2 Unit Dial, (TOUJEO MAX SOLOSTAR) 300 UNIT/ML Solostar Pen Inject into the skin. 06/02/21   [provider]  ?insulin lispro (HUMALOG) 100 UNIT/ML KwikPen Inject 0.01-0.09 mLs (1-9 Units total) into the skin 3 (three) times daily before meals. Blood sugars 70-120: no Insulin 121-150: 2 units 151-200: 3 units 201-250: 4 units 251-300: 5 units 301-350: 6 units 351-400: 9 units More than 400 use 9 units and call your doctor 08/09/20   Barb Merino, MD  ?Iron-Vitamin C 100-250 MG TABS Take by mouth.    [provider]  ?Lancets Glory Rosebush ULTRASOFT) lancets Use as instructed ?Patient taking differently: 1 each by Other route as directed. Use as instructed 08/27/15   Marina Goodell A, MD  ?losartan (COZAAR) 25 MG tablet Take 25  mg by mouth daily. 10/05/20   [provider]  ?oxyCODONE-acetaminophen (PERCOCET/ROXICET) 5-325 MG tablet Take 1 tablet by mouth every 6 (six) hours as needed for severe pain. 02/10/22   Mickie Hillier, PA-C  ?PRESCRIPTION MEDICATION IRON INFUSIONS as needed. [facility]).    [provider]  ?rOPINIRole (REQUIP) 0.5 MG tablet TAKE 1 TABLET(0.5 MG) BY MOUTH AT BEDTIME 09/06/21   [provider]  ?traMADol (ULTRAM) 50 MG tablet Take 1 tablet (50 mg total) by mouth every 6 (six) hours as needed. 01/31/22   Evlyn Courier, PA-C  ?traZODone (DESYREL) 100  MG tablet Take 200 mg by mouth at bedtime as needed for sleep.  03/18/20   [provider]  ?   ? ?Allergies    ?Levofloxacin   ? ?Review of Systems   ?Review of Systems  ?Musculoskeletal:   ?     R shoulder, R knee, L wrist pain   ?All other systems reviewed and are negative. ? ?Physical Exam ?Updated Vital Signs ?BP (!) 157/70 (BP Location: Left Arm)   Pulse 78   Temp 98 ?F (36.7 ?C) (Oral)   Resp 16   Ht '5\' 5"'$  (1.651 m)   Wt 104.3 kg   SpO2 98%   BMI 38.27 kg/m?  ?Physical Exam ?Vitals and nursing note reviewed.  ?Constitutional:   ?   Appearance: Normal appearance.  ?HENT:  ?   Head: Normocephalic.  ?   Nose: Nose normal.  ?   Mouth/Throat:  ?   Mouth: Mucous membranes are moist.  ?Eyes:  ?   Extraocular Movements: Extraocular movements intact.  ?   Pupils: Pupils are equal, round, and reactive to light.  ?Cardiovascular:  ?   Rate and Rhythm: Normal rate and regular rhythm.  ?   Pulses: Normal pulses.  ?Pulmonary:  ?   Effort: Pulmonary effort is normal.  ?   Breath sounds: Normal breath sounds.  ?Abdominal:  ?   General: Abdomen is flat.  ?   Palpations: Abdomen is soft.  ?Musculoskeletal:  ?   Cervical back: Normal range of motion.  ?   Comments: Right shoulder normal range of motion.  Mild tenderness in the proximal humerus area but no obvious deformity.  Patient has tenderness over the carpal tunnel area but no deformity.  Patient also has tenderness of the right knee but no obvious deformity there.  Patient able to ambulate  ?Skin: ?   General: Skin is warm.  ?   Capillary Refill: Capillary refill takes less than 2 seconds.  ?Neurological:  ?   General: No focal deficit present.  ?   Mental Status: She is alert and oriented to person, place, and time.  ?Psychiatric:     ?   Mood and Affect: Mood normal.     ?   Behavior: Behavior normal.  ? ? ?ED Results / Procedures / Treatments   ?Labs ?(all labs ordered are listed, but only abnormal results are displayed) ?Labs Reviewed - No data to  display ? ?EKG ?None ? ?Radiology ?No results found. ? ?Procedures ?Procedures  ? ? ?Medications Ordered in ED ?Medications  ?methylPREDNISolone sodium succinate (SOLU-MEDROL) 125 mg/2 mL injection 125 mg (has no administration in time range)  ?HYDROcodone-acetaminophen (NORCO/VICODIN) 5-325 MG per tablet 2 tablet (has no administration in time range)  ? ? ?ED Course/ Medical Decision Making/ A&P ?  ?                        ?  Medical Decision Making ?Veronica Simmons is a 71 y.o. female here with right shoulder pain and left wrist pain and right knee pain.  Patient has previous imaging that shows tendinitis at the right shoulder.  Also clinically she has carpal tunnel on the left.  She has a Copy.  However she does not have an orthopedic doctor. We will refer to Ortho and will start on course of steroids and give several pills of pain medicine ? ? 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 ?Risk ?Prescription drug management. ? ?Final Clinical Impression(s) / ED Diagnoses ?Final diagnoses:  ?None  ? ? ?Rx / DC Orders ?ED Discharge Orders   ? ? None  ? ?  ? ? ?  ?Drenda Freeze, MD ?04/01/22 1712 ? ?

## 2022-04-01 NOTE — Discharge Instructions (Addendum)
You likely have tendinitis of the shoulder and right knee ? ?Please take prednisone as prescribed  ? ?Take Tylenol or Motrin for pain and Vicodin for severe pain ? ?You should follow-up with your hand doctor and you should also see orthopedic doctor as well ? ?Return to ER if you have worse shoulder or knee pain or fever ?

## 2022-04-03 ENCOUNTER — Ambulatory Visit (HOSPITAL_COMMUNITY)
Admission: RE | Admit: 2022-04-03 | Discharge: 2022-04-03 | Disposition: A | Discharge status: Home / Self Care | Payer: 59 | Source: Ambulatory Visit | Attending: Nephrology | Admitting: Nephrology

## 2022-04-03 VITALS — BP 176/73 | HR 71 | Temp 96.5°F

## 2022-04-03 DIAGNOSIS — N183 Chronic kidney disease, stage 3 unspecified: Secondary | ICD-10-CM | POA: Insufficient documentation

## 2022-04-03 DIAGNOSIS — D631 Anemia in chronic kidney disease: Secondary | ICD-10-CM | POA: Diagnosis present

## 2022-04-03 LAB — POCT HEMOGLOBIN-HEMACUE: Hemoglobin: 10.8 g/dL — ABNORMAL LOW (ref 12.0–15.0)

## 2022-04-03 MED ORDER — EPOETIN ALFA-EPBX 10000 UNIT/ML IJ SOLN
INTRAMUSCULAR | Status: AC
  Filled 2022-04-03: qty 2

## 2022-04-03 MED ORDER — EPOETIN ALFA-EPBX 10000 UNIT/ML IJ SOLN
15000.0000 [IU] | INTRAMUSCULAR | Status: DC
  Administered 2022-04-03: 15000 [IU] via SUBCUTANEOUS

## 2022-04-17 ENCOUNTER — Encounter (HOSPITAL_COMMUNITY)
Admission: RE | Admit: 2022-04-17 | Discharge: 2022-04-17 | Disposition: A | Discharge status: Home / Self Care | Payer: 59 | Source: Ambulatory Visit | Attending: Nephrology | Admitting: Nephrology

## 2022-04-17 VITALS — BP 175/90 | HR 83 | Temp 97.1°F | Resp 18

## 2022-04-17 DIAGNOSIS — D631 Anemia in chronic kidney disease: Secondary | ICD-10-CM | POA: Insufficient documentation

## 2022-04-17 DIAGNOSIS — N183 Chronic kidney disease, stage 3 unspecified: Secondary | ICD-10-CM | POA: Diagnosis present

## 2022-04-17 LAB — IRON AND TIBC
Iron: 67 ug/dL (ref 28–170)
Saturation Ratios: 24 % (ref 10.4–31.8)
TIBC: 274 ug/dL (ref 250–450)
UIBC: 207 ug/dL

## 2022-04-17 LAB — FERRITIN: Ferritin: 820 ng/mL — ABNORMAL HIGH (ref 11–307)

## 2022-04-17 MED ORDER — EPOETIN ALFA-EPBX 10000 UNIT/ML IJ SOLN: 15000.0000 [IU] | INTRAMUSCULAR | Status: DC

## 2022-04-17 MED ORDER — EPOETIN ALFA-EPBX 10000 UNIT/ML IJ SOLN
INTRAMUSCULAR | Status: AC
  Administered 2022-04-17: 15000 [IU] via SUBCUTANEOUS
  Filled 2022-04-17: qty 2

## 2022-04-18 LAB — POCT HEMOGLOBIN-HEMACUE: Hemoglobin: 9.9 g/dL — ABNORMAL LOW (ref 12.0–15.0)

## 2022-05-01 ENCOUNTER — Encounter (HOSPITAL_COMMUNITY)
Admission: RE | Admit: 2022-05-01 | Discharge: 2022-05-01 | Disposition: A | Discharge status: Home / Self Care | Payer: 59 | Source: Ambulatory Visit | Attending: Nephrology | Admitting: Nephrology

## 2022-05-01 VITALS — BP 153/93 | HR 88 | Temp 96.6°F

## 2022-05-01 DIAGNOSIS — N183 Chronic kidney disease, stage 3 unspecified: Secondary | ICD-10-CM | POA: Diagnosis not present

## 2022-05-01 DIAGNOSIS — D631 Anemia in chronic kidney disease: Secondary | ICD-10-CM

## 2022-05-01 LAB — POCT HEMOGLOBIN-HEMACUE: Hemoglobin: 10.6 g/dL — ABNORMAL LOW (ref 12.0–15.0)

## 2022-05-01 MED ORDER — EPOETIN ALFA-EPBX 10000 UNIT/ML IJ SOLN
INTRAMUSCULAR | Status: AC
  Filled 2022-05-01: qty 2

## 2022-05-01 MED ORDER — EPOETIN ALFA-EPBX 10000 UNIT/ML IJ SOLN
15000.0000 [IU] | INTRAMUSCULAR | Status: DC
  Administered 2022-05-01: 15000 [IU] via SUBCUTANEOUS

## 2022-05-16 ENCOUNTER — Inpatient Hospital Stay (HOSPITAL_COMMUNITY): Admission: RE | Admit: 2022-05-16 | Payer: 59 | Source: Ambulatory Visit

## 2022-05-29 ENCOUNTER — Encounter (HOSPITAL_COMMUNITY)
Admission: RE | Admit: 2022-05-29 | Discharge: 2022-05-29 | Disposition: A | Discharge status: Home / Self Care | Payer: 59 | Source: Ambulatory Visit | Attending: Nephrology | Admitting: Nephrology

## 2022-05-29 VITALS — BP 164/72 | HR 80 | Temp 97.0°F | Resp 20

## 2022-05-29 DIAGNOSIS — D631 Anemia in chronic kidney disease: Secondary | ICD-10-CM | POA: Insufficient documentation

## 2022-05-29 DIAGNOSIS — N183 Chronic kidney disease, stage 3 unspecified: Secondary | ICD-10-CM | POA: Insufficient documentation

## 2022-05-29 LAB — RENAL FUNCTION PANEL
Albumin: 3.7 g/dL (ref 3.5–5.0)
Anion gap: 10 (ref 5–15)
BUN: 33 mg/dL — ABNORMAL HIGH (ref 8–23)
CO2: 27 mmol/L (ref 22–32)
Calcium: 9.3 mg/dL (ref 8.9–10.3)
Chloride: 99 mmol/L (ref 98–111)
Creatinine, Ser: 2.47 mg/dL — ABNORMAL HIGH (ref 0.44–1.00)
GFR, Estimated: 20 mL/min — ABNORMAL LOW (ref >60)
Glucose, Bld: 336 mg/dL — ABNORMAL HIGH (ref 70–99)
Phosphorus: 3.8 mg/dL (ref 2.5–4.6)
Potassium: 4 mmol/L (ref 3.5–5.1)
Sodium: 136 mmol/L (ref 135–145)

## 2022-05-29 LAB — VITAMIN D 25 HYDROXY (VIT D DEFICIENCY, FRACTURES): Vit D, 25-Hydroxy: 32.04 ng/mL (ref 30–100)

## 2022-05-29 LAB — IRON AND TIBC
Iron: 72 ug/dL (ref 28–170)
Saturation Ratios: 24 % (ref 10.4–31.8)
TIBC: 304 ug/dL (ref 250–450)
UIBC: 232 ug/dL

## 2022-05-29 LAB — FERRITIN: Ferritin: 766 ng/mL — ABNORMAL HIGH (ref 11–307)

## 2022-05-29 LAB — POCT HEMOGLOBIN-HEMACUE: Hemoglobin: 10.7 g/dL — ABNORMAL LOW (ref 12.0–15.0)

## 2022-05-29 MED ORDER — EPOETIN ALFA-EPBX 10000 UNIT/ML IJ SOLN
INTRAMUSCULAR | Status: AC
  Filled 2022-05-29: qty 2

## 2022-05-29 MED ORDER — EPOETIN ALFA-EPBX 10000 UNIT/ML IJ SOLN
15000.0000 [IU] | INTRAMUSCULAR | Status: DC
  Administered 2022-05-29: 15000 [IU] via SUBCUTANEOUS

## 2022-05-30 LAB — PTH, INTACT AND CALCIUM
Calcium, Total (PTH): 9.5 mg/dL (ref 8.7–10.3)
PTH: 83 pg/mL — ABNORMAL HIGH (ref 15–65)

## 2022-06-12 ENCOUNTER — Encounter (HOSPITAL_COMMUNITY)
Admission: RE | Admit: 2022-06-12 | Discharge: 2022-06-12 | Disposition: A | Discharge status: Home / Self Care | Payer: 59 | Source: Ambulatory Visit | Attending: Nephrology | Admitting: Nephrology

## 2022-06-12 VITALS — BP 176/78 | HR 88 | Temp 96.4°F | Resp 17

## 2022-06-12 DIAGNOSIS — N183 Chronic kidney disease, stage 3 unspecified: Secondary | ICD-10-CM

## 2022-06-12 LAB — POCT HEMOGLOBIN-HEMACUE: Hemoglobin: 9.7 g/dL — ABNORMAL LOW (ref 12.0–15.0)

## 2022-06-12 MED ORDER — EPOETIN ALFA-EPBX 10000 UNIT/ML IJ SOLN
INTRAMUSCULAR | Status: AC
  Administered 2022-06-12: 10000 [IU]
  Filled 2022-06-12: qty 1

## 2022-06-12 MED ORDER — EPOETIN ALFA-EPBX 2000 UNIT/ML IJ SOLN
INTRAMUSCULAR | Status: AC
Start: 2022-06-12 — End: 2022-06-12
  Administered 2022-06-12: 2000 [IU]
  Filled 2022-06-12: qty 1

## 2022-06-12 MED ORDER — EPOETIN ALFA-EPBX 10000 UNIT/ML IJ SOLN: 15000.0000 [IU] | INTRAMUSCULAR | Status: DC

## 2022-06-12 MED ORDER — EPOETIN ALFA-EPBX 3000 UNIT/ML IJ SOLN
INTRAMUSCULAR | Status: AC
Start: 2022-06-12 — End: 2022-06-12
  Administered 2022-06-12: 3000 [IU]
  Filled 2022-06-12: qty 1

## 2022-06-26 ENCOUNTER — Encounter (HOSPITAL_COMMUNITY)
Admission: RE | Admit: 2022-06-26 | Discharge: 2022-06-26 | Disposition: A | Discharge status: Home / Self Care | Payer: 59 | Source: Ambulatory Visit | Attending: Nephrology | Admitting: Nephrology

## 2022-06-26 VITALS — BP 179/72 | HR 84 | Temp 96.3°F

## 2022-06-26 DIAGNOSIS — D631 Anemia in chronic kidney disease: Secondary | ICD-10-CM | POA: Diagnosis not present

## 2022-06-26 DIAGNOSIS — N183 Chronic kidney disease, stage 3 unspecified: Secondary | ICD-10-CM | POA: Diagnosis present

## 2022-06-26 DIAGNOSIS — N184 Chronic kidney disease, stage 4 (severe): Secondary | ICD-10-CM | POA: Diagnosis present

## 2022-06-26 LAB — FERRITIN: Ferritin: 647 ng/mL — ABNORMAL HIGH (ref 11–307)

## 2022-06-26 LAB — IRON AND TIBC
Iron: 87 ug/dL (ref 28–170)
Saturation Ratios: 29 % (ref 10.4–31.8)
TIBC: 305 ug/dL (ref 250–450)
UIBC: 218 ug/dL

## 2022-06-26 LAB — POCT HEMOGLOBIN-HEMACUE: Hemoglobin: 10.7 g/dL — ABNORMAL LOW (ref 12.0–15.0)

## 2022-06-26 MED ORDER — EPOETIN ALFA-EPBX 10000 UNIT/ML IJ SOLN: 15000.0000 [IU] | INTRAMUSCULAR | Status: DC

## 2022-06-26 MED ORDER — EPOETIN ALFA 20000 UNIT/ML IJ SOLN
15000.0000 [IU] | Freq: Once | INTRAMUSCULAR | Status: AC
  Administered 2022-06-26: 15000 [IU] via SUBCUTANEOUS

## 2022-06-26 MED ORDER — EPOETIN ALFA 20000 UNIT/ML IJ SOLN
INTRAMUSCULAR | Status: AC
  Filled 2022-06-26: qty 1

## 2022-07-10 ENCOUNTER — Encounter (HOSPITAL_COMMUNITY)
Admission: RE | Admit: 2022-07-10 | Discharge: 2022-07-10 | Disposition: A | Discharge status: Home / Self Care | Payer: 59 | Source: Ambulatory Visit | Attending: Nephrology | Admitting: Nephrology

## 2022-07-10 VITALS — BP 157/76 | HR 67 | Temp 97.8°F | Resp 18

## 2022-07-10 DIAGNOSIS — N184 Chronic kidney disease, stage 4 (severe): Secondary | ICD-10-CM | POA: Diagnosis not present

## 2022-07-10 DIAGNOSIS — N183 Chronic kidney disease, stage 3 unspecified: Secondary | ICD-10-CM | POA: Diagnosis not present

## 2022-07-10 LAB — POCT HEMOGLOBIN-HEMACUE: Hemoglobin: 10.7 g/dL — ABNORMAL LOW (ref 12.0–15.0)

## 2022-07-10 MED ORDER — EPOETIN ALFA 20000 UNIT/ML IJ SOLN: 15000.0000 [IU] | Freq: Once | INTRAMUSCULAR | Status: DC

## 2022-07-10 MED ORDER — EPOETIN ALFA-EPBX 10000 UNIT/ML IJ SOLN: 15000.0000 [IU] | INTRAMUSCULAR | Status: DC

## 2022-07-10 MED ORDER — EPOETIN ALFA 20000 UNIT/ML IJ SOLN
INTRAMUSCULAR | Status: AC
  Administered 2022-07-10: 15000 [IU] via SUBCUTANEOUS
  Filled 2022-07-10: qty 1

## 2022-07-24 ENCOUNTER — Inpatient Hospital Stay (HOSPITAL_COMMUNITY): Admission: RE | Admit: 2022-07-24 | Payer: 59 | Source: Ambulatory Visit

## 2022-07-25 ENCOUNTER — Encounter (HOSPITAL_COMMUNITY)
Admission: RE | Admit: 2022-07-25 | Discharge: 2022-07-25 | Disposition: A | Discharge status: Home / Self Care | Payer: 59 | Source: Ambulatory Visit | Attending: Nephrology | Admitting: Nephrology

## 2022-07-25 VITALS — BP 179/87 | HR 85 | Resp 18

## 2022-07-25 DIAGNOSIS — N183 Chronic kidney disease, stage 3 unspecified: Secondary | ICD-10-CM | POA: Insufficient documentation

## 2022-07-25 DIAGNOSIS — N184 Chronic kidney disease, stage 4 (severe): Secondary | ICD-10-CM | POA: Insufficient documentation

## 2022-07-25 DIAGNOSIS — D631 Anemia in chronic kidney disease: Secondary | ICD-10-CM | POA: Diagnosis present

## 2022-07-25 LAB — IRON AND TIBC
Iron: 74 ug/dL (ref 28–170)
Saturation Ratios: 27 % (ref 10.4–31.8)
TIBC: 279 ug/dL (ref 250–450)
UIBC: 205 ug/dL

## 2022-07-25 LAB — POCT HEMOGLOBIN-HEMACUE: Hemoglobin: 10.3 g/dL — ABNORMAL LOW (ref 12.0–15.0)

## 2022-07-25 LAB — FERRITIN: Ferritin: 581 ng/mL — ABNORMAL HIGH (ref 11–307)

## 2022-07-25 MED ORDER — EPOETIN ALFA-EPBX 10000 UNIT/ML IJ SOLN
15000.0000 [IU] | INTRAMUSCULAR | Status: DC
  Filled 2022-07-25: qty 2

## 2022-07-25 MED ORDER — EPOETIN ALFA 20000 UNIT/ML IJ SOLN
INTRAMUSCULAR | Status: AC
  Administered 2022-07-25: 15000 [IU] via SUBCUTANEOUS
  Filled 2022-07-25: qty 1

## 2022-08-07 ENCOUNTER — Encounter (HOSPITAL_COMMUNITY): Payer: 59

## 2022-08-08 ENCOUNTER — Encounter (HOSPITAL_COMMUNITY)
Admission: RE | Admit: 2022-08-08 | Discharge: 2022-08-08 | Disposition: A | Discharge status: Home / Self Care | Payer: 59 | Source: Ambulatory Visit | Attending: Nephrology | Admitting: Nephrology

## 2022-08-08 VITALS — BP 193/72 | HR 80 | Temp 97.1°F | Resp 18

## 2022-08-08 DIAGNOSIS — N183 Chronic kidney disease, stage 3 unspecified: Secondary | ICD-10-CM | POA: Diagnosis not present

## 2022-08-08 DIAGNOSIS — D631 Anemia in chronic kidney disease: Secondary | ICD-10-CM

## 2022-08-08 LAB — POCT HEMOGLOBIN-HEMACUE: Hemoglobin: 11 g/dL — ABNORMAL LOW (ref 12.0–15.0)

## 2022-08-08 MED ORDER — EPOETIN ALFA 20000 UNIT/ML IJ SOLN: 15000.0000 [IU] | Freq: Once | INTRAMUSCULAR | Status: DC

## 2022-08-08 MED ORDER — EPOETIN ALFA-EPBX 10000 UNIT/ML IJ SOLN
15000.0000 [IU] | Freq: Once | INTRAMUSCULAR | Status: DC
Start: 2022-08-08 — End: 2022-08-08

## 2022-08-08 NOTE — Progress Notes (Signed)
BP too high today for injection.  Pt had not had her BP meds today.  Hemocue was 11.  Orders from Dr Carolin Sicks were to hold the shot, have her come back next week and tell her to take her meds prior to her appts.

## 2022-08-15 ENCOUNTER — Encounter (HOSPITAL_COMMUNITY)
Admission: RE | Admit: 2022-08-15 | Discharge: 2022-08-15 | Disposition: A | Discharge status: Home / Self Care | Payer: 59 | Source: Ambulatory Visit | Attending: Nephrology | Admitting: Nephrology

## 2022-08-15 VITALS — BP 175/79 | HR 81 | Temp 97.4°F

## 2022-08-15 DIAGNOSIS — N183 Chronic kidney disease, stage 3 unspecified: Secondary | ICD-10-CM | POA: Diagnosis not present

## 2022-08-15 MED ORDER — EPOETIN ALFA-EPBX 10000 UNIT/ML IJ SOLN: 15000.0000 [IU] | INTRAMUSCULAR | Status: DC

## 2022-08-15 MED ORDER — EPOETIN ALFA 20000 UNIT/ML IJ SOLN
INTRAMUSCULAR | Status: AC
  Administered 2022-08-15: 15000 [IU] via SUBCUTANEOUS
  Filled 2022-08-15: qty 1

## 2022-08-15 MED ORDER — EPOETIN ALFA 20000 UNIT/ML IJ SOLN: 15000.0000 [IU] | Freq: Once | INTRAMUSCULAR | Status: DC

## 2022-08-22 ENCOUNTER — Encounter (HOSPITAL_COMMUNITY): Payer: 59

## 2022-08-29 ENCOUNTER — Encounter (HOSPITAL_COMMUNITY): Payer: 59

## 2022-09-01 ENCOUNTER — Encounter (HOSPITAL_BASED_OUTPATIENT_CLINIC_OR_DEPARTMENT_OTHER): Payer: Self-pay | Admitting: Emergency Medicine

## 2022-09-01 ENCOUNTER — Other Ambulatory Visit: Payer: Self-pay

## 2022-09-01 ENCOUNTER — Emergency Department (HOSPITAL_BASED_OUTPATIENT_CLINIC_OR_DEPARTMENT_OTHER)
Admission: EM | Admit: 2022-09-01 | Discharge: 2022-09-01 | Disposition: A | Discharge status: Home / Self Care | Payer: 59 | Attending: Emergency Medicine | Admitting: Emergency Medicine

## 2022-09-01 ENCOUNTER — Emergency Department (HOSPITAL_BASED_OUTPATIENT_CLINIC_OR_DEPARTMENT_OTHER): Payer: 59

## 2022-09-01 DIAGNOSIS — M25522 Pain in left elbow: Secondary | ICD-10-CM | POA: Diagnosis not present

## 2022-09-01 DIAGNOSIS — I10 Essential (primary) hypertension: Secondary | ICD-10-CM | POA: Diagnosis not present

## 2022-09-01 DIAGNOSIS — Y92009 Unspecified place in unspecified non-institutional (private) residence as the place of occurrence of the external cause: Secondary | ICD-10-CM | POA: Insufficient documentation

## 2022-09-01 DIAGNOSIS — W010XXA Fall on same level from slipping, tripping and stumbling without subsequent striking against object, initial encounter: Secondary | ICD-10-CM | POA: Diagnosis not present

## 2022-09-01 DIAGNOSIS — S42292A Other displaced fracture of upper end of left humerus, initial encounter for closed fracture: Secondary | ICD-10-CM

## 2022-09-01 DIAGNOSIS — E119 Type 2 diabetes mellitus without complications: Secondary | ICD-10-CM | POA: Diagnosis not present

## 2022-09-01 DIAGNOSIS — Z794 Long term (current) use of insulin: Secondary | ICD-10-CM | POA: Insufficient documentation

## 2022-09-01 DIAGNOSIS — S4992XA Unspecified injury of left shoulder and upper arm, initial encounter: Secondary | ICD-10-CM | POA: Diagnosis present

## 2022-09-01 DIAGNOSIS — Z79899 Other long term (current) drug therapy: Secondary | ICD-10-CM | POA: Insufficient documentation

## 2022-09-01 DIAGNOSIS — M25532 Pain in left wrist: Secondary | ICD-10-CM | POA: Diagnosis not present

## 2022-09-01 DIAGNOSIS — M25531 Pain in right wrist: Secondary | ICD-10-CM | POA: Diagnosis not present

## 2022-09-01 MED ORDER — ONDANSETRON HCL 4 MG PO TABS: 4.0000 mg | ORAL_TABLET | Freq: Three times a day (TID) | ORAL | 0 refills | Status: AC | PRN

## 2022-09-01 MED ORDER — OXYCODONE-ACETAMINOPHEN 5-325 MG PO TABS
1.0000 | ORAL_TABLET | Freq: Once | ORAL | Status: DC
  Filled 2022-09-01: qty 1

## 2022-09-01 MED ORDER — OXYCODONE-ACETAMINOPHEN 5-325 MG PO TABS: 1.0000 | ORAL_TABLET | Freq: Three times a day (TID) | ORAL | 0 refills | Status: AC | PRN

## 2022-09-01 MED ORDER — HYDROMORPHONE HCL 1 MG/ML IJ SOLN
1.0000 mg | Freq: Once | INTRAMUSCULAR | Status: AC
  Administered 2022-09-01: 1 mg via INTRAMUSCULAR
  Filled 2022-09-01: qty 1

## 2022-09-01 NOTE — Discharge Instructions (Addendum)
You have a fracture of the left humerus.  No other fractures identified. Continue the pain medication as needed.  May add Tylenol to help with the pain. Keep the arm sling on. Follow-up with Dr. Stann Mainland next week for further management.

## 2022-09-01 NOTE — ED Triage Notes (Signed)
Left shoulder and arm injury after she tripped and fell yesterday at home , no loc . Presents with sling .

## 2022-09-01 NOTE — ED Provider Notes (Signed)
[facility] EMERGENCY DEPARTMENT Provider Note   CSN: [id number] Arrival date & time: 09/01/22  1201     History  Chief Complaint  Patient presents with   Veronica Simmons is a 71 y.o. female.  71 year old female with history of hypertension type 2 diabetes melitis presented to emergency department status post fall. Patient reports she tripped off of the bed railing last night and fell landed on the left shoulder.  Patient complaining of left shoulder, elbow and BL wrist pain.  Also complaining of some tingling and numbness in the left arm.  Denies weakness in the arm.  Patient denies head injury, LOC or any other injury related to the fall. She denies chest pain, shortness of breath. Pt took tylenol this morning which helped with the pain.  The history is provided by the patient. No language interpreter was used.  Fall This is a new problem. The current episode started yesterday. The problem occurs constantly.       Home Medications Prior to Admission medications   Medication Sig Start Date End Date Taking? Authorizing Provider  ondansetron (ZOFRAN) 4 MG tablet Take 1 tablet (4 mg total) by mouth every 8 (eight) hours as needed for up to 4 days for nausea or vomiting. 09/01/22 09/05/22 Yes Kaidyn Hernandes, MD  oxyCODONE-acetaminophen (PERCOCET) 5-325 MG tablet Take 1 tablet by mouth every 8 (eight) hours as needed for up to 4 days for severe pain. 09/01/22 09/05/22 Yes Ulrich Soules, MD  amLODipine (NORVASC) 10 MG tablet Take 1 tablet (10 mg total) by mouth daily. 10/18/19   Dana Allan I, MD  atorvastatin (LIPITOR) 10 MG tablet Take 10 mg by mouth daily.    [provider]  baclofen (LIORESAL) 10 MG tablet  06/13/21   [provider]  benzonatate (TESSALON) 100 MG capsule Take 1 capsule (100 mg total) by mouth every 8 (eight) hours. 05/08/21   Deno Etienne, DO  calcitRIOL (ROCALTROL) 0.25 MCG capsule Take 0.25 mcg by mouth  daily. 08/27/20   [provider]  carvedilol (COREG) 25 MG tablet Take 25 mg by mouth 2 (two) times daily. 07/20/20   [provider]  Coenzyme Q10 (COQ10 PO) Take 1 tablet by mouth daily.    [provider]  ferrous sulfate 325 (65 FE) MG tablet Take 1 tablet by mouth daily.    [provider]  fluticasone Asencion Islam) 50 MCG/ACT nasal spray  06/13/21   [provider]  FOLIC ACID PO Take by mouth.    [provider]  furosemide (LASIX) 80 MG tablet Take 1 tablet (80 mg total) by mouth daily. 08/14/20 09/06/21  Ghimire, Henreitta Leber, MD  glucose blood (ACCU-CHEK AVIVA PLUS) test strip Use as instructed Patient taking differently: 1 each by Other route as directed. Use as instructed 01/06/16   Haney, Yetta Flock A, MD  glucose blood (ACCU-CHEK SMARTVIEW) test strip Test blood sugar once daily Patient taking differently: 1 each by Other route See admin instructions. Test blood sugar once daily 10/12/16   Haney, Alyssa A, MD  glucose blood test strip Use as instructed Patient taking differently: 1 each by Other route as directed. Use as instructed 12/29/15   Marina Goodell A, MD  hydrALAZINE (APRESOLINE) 25 MG tablet Take 1 tablet (25 mg total) by mouth every 8 (eight) hours. 08/09/20 09/06/21  Barb Merino, MD  HYDROcodone-acetaminophen (NORCO/VICODIN) 5-325 MG tablet Take 1 tablet by mouth every 6 (six) hours as needed. 04/01/22   Darl Householder,  Lujean Rave, MD  insulin glargine, 2 Unit Dial, (TOUJEO MAX SOLOSTAR) 300 UNIT/ML Solostar Pen Inject into the skin. 06/02/21   [provider]  insulin lispro (HUMALOG) 100 UNIT/ML KwikPen Inject 0.01-0.09 mLs (1-9 Units total) into the skin 3 (three) times daily before meals. Blood sugars 70-120: no Insulin 121-150: 2 units 151-200: 3 units 201-250: 4 units 251-300: 5 units 301-350: 6 units 351-400: 9 units More than 400 use 9 units and call your doctor 08/09/20   Barb Merino, MD  Iron-Vitamin C 100-250 MG TABS Take by  mouth.    [provider]  Lancets [facility] ULTRASOFT) lancets Use as instructed Patient taking differently: 1 each by Other route as directed. Use as instructed 08/27/15   Marina Goodell A, MD  losartan (COZAAR) 25 MG tablet Take 25 mg by mouth daily. 10/05/20   [provider]  predniSONE (DELTASONE) 20 MG tablet Take 60 mg daily x 2 days then 40 mg daily x 2 days then 20 mg daily x 2 days 04/01/22   Drenda Freeze, MD  PRESCRIPTION MEDICATION IRON INFUSIONS as needed. [facility]).    [provider]  rOPINIRole (REQUIP) 0.5 MG tablet TAKE 1 TABLET(0.5 MG) BY MOUTH AT BEDTIME 09/06/21   [provider]  traMADol (ULTRAM) 50 MG tablet Take 1 tablet (50 mg total) by mouth every 6 (six) hours as needed. 01/31/22   Evlyn Courier, PA-C  traZODone (DESYREL) 100 MG tablet Take 200 mg by mouth at bedtime as needed for sleep.  03/18/20   [provider]      Allergies    Levofloxacin    Review of Systems   Review of Systems  Musculoskeletal:  Positive for joint swelling (Left shoulder and elbow, bilateral wrist pain).  Skin:  Negative for rash.  Neurological:  Positive for numbness.    Physical Exam Updated Vital Signs BP (!) 185/93   Pulse 80   Temp 98.1 F (36.7 C) (Oral)   Resp 16   Ht '5\' 5"'$  (1.651 m)   Wt 104.8 kg   SpO2 97%   BMI 38.44 kg/m  Physical Exam Vitals and nursing note reviewed.  Constitutional:      Appearance: Normal appearance.  HENT:     Head: Normocephalic and atraumatic.  Eyes:     Conjunctiva/sclera: Conjunctivae normal.  Cardiovascular:     Rate and Rhythm: Normal rate and regular rhythm.  Pulmonary:     Effort: Pulmonary effort is normal.     Breath sounds: Normal breath sounds.  Musculoskeletal:        General: Swelling, tenderness (Diffuse tenderness to palpation to left shoulder, left clavicle, scapular area.  Tenderness to palpation to left humerus. Mild tenderness to palpation to left elbow.Mild swelling and  tenderness to palpation to left wrist.  Mild tenderness to right wrist) and signs of injury present.     Left upper arm: Tenderness and bony tenderness present. No deformity or lacerations.     Left elbow: No lacerations. Decreased range of motion. Tenderness present.     Right wrist: Tenderness present.     Left wrist: Tenderness present. Decreased range of motion.       Arms:     Cervical back: Normal range of motion and neck supple. No rigidity or tenderness.     Comments: Decreased strength in grip left hand.  Sensorimotor function intact.  Neurovascular intact.   Skin:    Findings: No rash.  Neurological:     Mental Status:  She is alert.  Psychiatric:        Mood and Affect: Mood normal.     ED Results / Procedures / Treatments   Labs (all labs ordered are listed, but only abnormal results are displayed) Labs Reviewed - No data to display  EKG None  Radiology DG Wrist Complete Left  Result Date: 09/01/2022 CLINICAL DATA:  Fall.  Wrist pain. EXAM: LEFT WRIST - COMPLETE 3+ VIEW COMPARISON:  Left forearm and left hand radiographs 01/31/2022 FINDINGS: No acute fracture or dislocation is identified. Advanced osteoarthrosis is again noted at the first [facility] joint. Mild soft tissue swelling is noted at the wrist. IMPRESSION: No acute osseous abnormality identified. Electronically Signed   By: Logan Bores M.D.   On: 09/01/2022 14:04   DG Wrist Complete Right  Result Date: 09/01/2022 CLINICAL DATA:  Left shoulder pain EXAM: RIGHT WRIST - COMPLETE 3+ VIEW COMPARISON:  None Available. FINDINGS: No acute fracture or dislocation. No aggressive osseous lesion. Normal alignment. Generalized osteopenia. Severe osteoarthritis of the first [facility] joint. Mild osteoarthritis of the first MCP joint. Soft tissue are unremarkable. No radiopaque foreign body or soft tissue emphysema. IMPRESSION: 1. No acute osseous injury of the right wrist. 2. Severe osteoarthritis of the first [facility] joint. Electronically  Signed   By: Kathreen Devoid M.D.   On: 09/01/2022 14:03   DG Elbow 2 Views Left  Result Date: 09/01/2022 CLINICAL DATA:  Left arm injury after tripped and fell yesterday. EXAM: LEFT ELBOW - 2 VIEW COMPARISON:  Left forearm radiographs 01/31/2022 FINDINGS: The frontal view is limited due to obliquity and overlap of the proximal radius and ulna. There is diffuse decreased bone mineralization. No joint effusion. Mild radiocarpal anterolisthesis-olecranon joint space narrowing. No acute fracture is seen. IMPRESSION: No definite acute fracture is seen. Frontal view is limited by proximal radius and ulna bone overlap. No joint effusion. Electronically Signed   By: Yvonne Kendall M.D.   On: 09/01/2022 13:26   DG Shoulder Left  Result Date: 09/01/2022 CLINICAL DATA:  Left shoulder and arm injury after tripped and fell yesterday at home. EXAM: LEFT SHOULDER - 2+ VIEW COMPARISON:  None Available. FINDINGS: There is diffuse decreased bone mineralization. There is a comminuted fracture of the left proximal humeral greater tuberosity and surgical neck. There is up to approximately 1 cm lateral displacement of the greater tuberosity fracture fragments. There is up to approximately 1 cm medial and 1 cm anterior displacement of the humeral diaphysis with respect to the humeral head. The humeral head appears located with respect to the glenoid. Moderate peripheral glenoid degenerative osteophytosis. Mild peripheral acromioclavicular osteophytosis. IMPRESSION: Comminuted, displaced left humeral greater tuberosity and surgical neck acute fractures. Electronically Signed   By: Yvonne Kendall M.D.   On: 09/01/2022 13:23    Procedures Procedures    Medications Ordered in ED Medications  HYDROmorphone (DILAUDID) injection 1 mg (1 mg Intramuscular Given 09/01/22 1403)    ED Course/ Medical Decision Making/ A&P                           Medical Decision Making 89-year-old female with history of hypertension, diabetes  mellitus type 2, morbid obesity presented to emergency department status post fall. Patient reports he tripped out of bed swelling and fell from standing position and landed on left shoulder.  Incident happened last night.  Patient complaining of left shoulder pain, left elbow and bilateral wrist pain.  Patient took Tylenol this  morning with some relief in pain.  Patient holding left arm abducted to the body.  No visible bruising, ecchymosis or deformity.  Patient denies chest pain, shortness of breath, palpitations, dizziness.  Patient denies head injury or LOC.  She denies any other injury related to the fall. Patient is in moderate distress secondary to pain.  Physical exam diffuse left shoulder tenderness to palpation, tender to palpation to left clavicle, left humerus, left elbow and bilateral wrist.  Decreased range of motion in the left arm.  Decreased strength in grip and left arm.  Sensorimotor, neurovascular intact.  No tenderness to palpation to C-spine, L-spine, hip joints. After the physical examination I have suspicion for left shoulder fracture. I will do x-rays of left shoulder, left elbow and bilateral wrist.  Dilaudid 1 mg IM x 1 for pain.  X-ray left shoulder:Comminuted, displaced left humeral greater tuberosity and surgical neck acute fractures. X-ray left and right wrist: Negative for fracture. X-ray left elbow: Negative for fracture.  I spoke with Hilbert Odor and orthopedic in regards to patient's clinical presentation and x-ray findings.  Orthopedics suggested arm sling, nonweightbearing and follow-up with Dr. Stann Mainland in the office next week.  I have discussed the orthopedic instructions and recommendations with the patient.  I will place the patient in arm sling and discharge with pain medication and Zofran.  Patient agrees with the plan of care. Patient is discharged home in stable condition.  No acute distress.     Amount and/or Complexity of Data Reviewed Radiology:  ordered.  Risk Prescription drug management.          Final Clinical Impression(s) / ED Diagnoses Final diagnoses:  Other closed displaced fracture of proximal end of left humerus, initial encounter    Rx / DC Orders ED Discharge Orders          Ordered    oxyCODONE-acetaminophen (PERCOCET) 5-325 MG tablet  Every 8 hours PRN        09/01/22 1425    ondansetron (ZOFRAN) 4 MG tablet  Every 8 hours PRN        09/01/22 1425              Trae Bovenzi, MD 09/01/22 1434    Tegeler, Gwenyth Allegra, MD 09/01/22 1443

## 2022-09-01 NOTE — ED Notes (Addendum)
Patient states her left arm is hurting . States that she has pain in her knees also She states that her left shoudler is also hurting,reports pain in left wrist also states that she can fell touch hurts when she try to wiggle finger. Weak grip.

## 2022-09-12 ENCOUNTER — Encounter (HOSPITAL_COMMUNITY): Payer: 59

## 2022-09-18 ENCOUNTER — Encounter (HOSPITAL_COMMUNITY): Payer: Self-pay

## 2022-09-19 ENCOUNTER — Encounter (HOSPITAL_BASED_OUTPATIENT_CLINIC_OR_DEPARTMENT_OTHER): Payer: Self-pay

## 2022-09-19 ENCOUNTER — Other Ambulatory Visit: Payer: Self-pay

## 2022-09-19 ENCOUNTER — Emergency Department (HOSPITAL_BASED_OUTPATIENT_CLINIC_OR_DEPARTMENT_OTHER)
Admission: EM | Admit: 2022-09-19 | Discharge: 2022-09-20 | Disposition: A | Discharge status: Home / Self Care | Payer: 59 | Attending: Emergency Medicine | Admitting: Emergency Medicine

## 2022-09-19 ENCOUNTER — Emergency Department (HOSPITAL_BASED_OUTPATIENT_CLINIC_OR_DEPARTMENT_OTHER): Payer: 59

## 2022-09-19 DIAGNOSIS — M25511 Pain in right shoulder: Secondary | ICD-10-CM | POA: Insufficient documentation

## 2022-09-19 DIAGNOSIS — E119 Type 2 diabetes mellitus without complications: Secondary | ICD-10-CM | POA: Diagnosis not present

## 2022-09-19 DIAGNOSIS — I10 Essential (primary) hypertension: Secondary | ICD-10-CM | POA: Diagnosis not present

## 2022-09-19 DIAGNOSIS — M79604 Pain in right leg: Secondary | ICD-10-CM | POA: Insufficient documentation

## 2022-09-19 DIAGNOSIS — Z79899 Other long term (current) drug therapy: Secondary | ICD-10-CM | POA: Insufficient documentation

## 2022-09-19 DIAGNOSIS — M79605 Pain in left leg: Secondary | ICD-10-CM | POA: Diagnosis not present

## 2022-09-19 DIAGNOSIS — R030 Elevated blood-pressure reading, without diagnosis of hypertension: Secondary | ICD-10-CM | POA: Diagnosis not present

## 2022-09-19 DIAGNOSIS — R2231 Localized swelling, mass and lump, right upper limb: Secondary | ICD-10-CM | POA: Insufficient documentation

## 2022-09-19 DIAGNOSIS — Z794 Long term (current) use of insulin: Secondary | ICD-10-CM | POA: Insufficient documentation

## 2022-09-19 DIAGNOSIS — M79601 Pain in right arm: Secondary | ICD-10-CM | POA: Insufficient documentation

## 2022-09-19 DIAGNOSIS — J209 Acute bronchitis, unspecified: Secondary | ICD-10-CM | POA: Insufficient documentation

## 2022-09-19 DIAGNOSIS — M25411 Effusion, right shoulder: Secondary | ICD-10-CM

## 2022-09-19 MED ORDER — HYDROCODONE-ACETAMINOPHEN 5-325 MG PO TABS
2.0000 | ORAL_TABLET | Freq: Once | ORAL | Status: AC
  Administered 2022-09-19: 2 via ORAL
  Filled 2022-09-19: qty 2

## 2022-09-19 NOTE — ED Triage Notes (Signed)
Pt reports overuse of RT arm due to LT shoulder fracture from last month. She thinks she has tendinitis which she has had before. States the pain is 5/10 and has had this happen before. No other complaints.

## 2022-09-19 NOTE — ED Provider Notes (Signed)
[facility] EMERGENCY DEPARTMENT Provider Note   CSN: [id number] Arrival date & time: 09/19/22  1923     History {Add pertinent medical, surgical, social history, OB history to HPI:1} Chief Complaint  Patient presents with   Arm Pain    Veronica Simmons is a 71 y.o. female.  The history is provided by the patient.  Arm Pain  She has history of hypertension, diabetes, hyperlipidemia and comes in because of right shoulder pain.  She had broken her left shoulder about 2-1/2 weeks ago and has been using her right arm for everything.  Yesterday, she started having pain in her shoulder and states it feels tight.  She does have history of tendinitis in that shoulder.  She has taken oxycodone for pain, but not gotten any relief.  She is also complaining of leg cramps which woke her up every hour through the night.  She tried some mustard and pickle juice without any improvement.   Home Medications Prior to Admission medications   Medication Sig Start Date End Date Taking? Authorizing Provider  amLODipine (NORVASC) 10 MG tablet Take 1 tablet (10 mg total) by mouth daily. 10/18/19   Dana Allan I, MD  atorvastatin (LIPITOR) 10 MG tablet Take 10 mg by mouth daily.    [provider]  baclofen (LIORESAL) 10 MG tablet  06/13/21   [provider]  benzonatate (TESSALON) 100 MG capsule Take 1 capsule (100 mg total) by mouth every 8 (eight) hours. 05/08/21   Deno Etienne, DO  calcitRIOL (ROCALTROL) 0.25 MCG capsule Take 0.25 mcg by mouth daily. 08/27/20   [provider]  carvedilol (COREG) 25 MG tablet Take 25 mg by mouth 2 (two) times daily. 07/20/20   [provider]  Coenzyme Q10 (COQ10 PO) Take 1 tablet by mouth daily.    [provider]  ferrous sulfate 325 (65 FE) MG tablet Take 1 tablet by mouth daily.    [provider]  fluticasone Asencion Islam) 50 MCG/ACT nasal spray  06/13/21   [provider]  FOLIC ACID PO Take  by mouth.    [provider]  furosemide (LASIX) 80 MG tablet Take 1 tablet (80 mg total) by mouth daily. 08/14/20 09/06/21  Ghimire, Henreitta Leber, MD  glucose blood (ACCU-CHEK AVIVA PLUS) test strip Use as instructed Patient taking differently: 1 each by Other route as directed. Use as instructed 01/06/16   Haney, Yetta Flock A, MD  glucose blood (ACCU-CHEK SMARTVIEW) test strip Test blood sugar once daily Patient taking differently: 1 each by Other route See admin instructions. Test blood sugar once daily 10/12/16   Haney, Alyssa A, MD  glucose blood test strip Use as instructed Patient taking differently: 1 each by Other route as directed. Use as instructed 12/29/15   Marina Goodell A, MD  hydrALAZINE (APRESOLINE) 25 MG tablet Take 1 tablet (25 mg total) by mouth every 8 (eight) hours. 08/09/20 09/06/21  Barb Merino, MD  HYDROcodone-acetaminophen (NORCO/VICODIN) 5-325 MG tablet Take 1 tablet by mouth every 6 (six) hours as needed. 04/01/22   Drenda Freeze, MD  insulin glargine, 2 Unit Dial, (TOUJEO MAX SOLOSTAR) 300 UNIT/ML Solostar Pen Inject into the skin. 06/02/21   [provider]  insulin lispro (HUMALOG) 100 UNIT/ML KwikPen Inject 0.01-0.09 mLs (1-9 Units total) into the skin 3 (three) times daily before meals. Blood sugars 70-120: no Insulin 121-150: 2 units 151-200: 3 units 201-250: 4 units 251-300: 5 units 301-350: 6 units 351-400: 9 units More than 400  use 9 units and call your doctor 08/09/20   Barb Merino, MD  Iron-Vitamin C 100-250 MG TABS Take by mouth.    [provider]  Lancets [facility] ULTRASOFT) lancets Use as instructed Patient taking differently: 1 each by Other route as directed. Use as instructed 08/27/15   Marina Goodell A, MD  losartan (COZAAR) 25 MG tablet Take 25 mg by mouth daily. 10/05/20   [provider]  predniSONE (DELTASONE) 20 MG tablet Take 60 mg daily x 2 days then 40 mg daily x 2 days then 20 mg daily x 2 days 04/01/22   Drenda Freeze, MD  PRESCRIPTION MEDICATION IRON INFUSIONS as needed. [facility]).    [provider]  rOPINIRole (REQUIP) 0.5 MG tablet TAKE 1 TABLET(0.5 MG) BY MOUTH AT BEDTIME 09/06/21   [provider]  traMADol (ULTRAM) 50 MG tablet Take 1 tablet (50 mg total) by mouth every 6 (six) hours as needed. 01/31/22   Evlyn Courier, PA-C  traZODone (DESYREL) 100 MG tablet Take 200 mg by mouth at bedtime as needed for sleep.  03/18/20   [provider]      Allergies    Levofloxacin    Review of Systems   Review of Systems  All other systems reviewed and are negative.   Physical Exam Updated Vital Signs BP (!) 177/66   Pulse 81   Temp 98.2 F (36.8 C) (Oral)   Resp 18   Ht '5\' 5"'$  (1.651 m)   Wt 104.3 kg   SpO2 96%   BMI 38.27 kg/m  Physical Exam Vitals and nursing note reviewed.   71 year old female, resting comfortably and in no acute distress. Vital signs are significant for elevated blood pressure. Oxygen saturation is 96%, which is normal. Head is normocephalic and atraumatic. PERRLA, EOMI. Oropharynx is clear. Neck is nontender and supple without adenopathy or JVD. Back is nontender and there is no CVA tenderness. Lungs are clear without rales, wheezes, or rhonchi. Chest is nontender. Heart has regular rate and rhythm without murmur. Abdomen is soft, flat, nontender without masses or hepatosplenomegaly and peristalsis is normoactive. Extremities.  Left arm is in a sling.  Right shoulder has moderate swelling diffusely with tenderness diffusely and pain with any passive range of motion.  There is no erythema or warmth. Skin is warm and dry without rash. Neurologic: Mental status is normal, cranial nerves are intact.  ED Results / Procedures / Treatments   Labs (all labs ordered are listed, but only abnormal results are displayed) Labs Reviewed - No data to display  Radiology No results found.  Procedures Procedures  {Document cardiac monitor, telemetry  assessment procedure when appropriate:1}  Medications Ordered in ED Medications - No data to display  ED Course/ Medical Decision Making/ A&P                           Medical Decision Making Amount and/or Complexity of Data Reviewed Labs: ordered. Radiology: ordered.  Risk Prescription drug management.   Right shoulder pain with swelling which does not seem typical for tendinitis as I cannot localize tenderness around a specific tendon nor does it seem typical of bursitis.  I suspect that she might have an inflammatory condition such as gout or gout, or [place] shoulder.  Doubt septic arthritis.  Leg cramps possibly related to electrolyte disturbance.  I have ordered CBC, basic metabolic panel, magnesium and I have ordered x-rays of the right  shoulder.  I have also ordered hydrocodone-acetaminophen for pain.  I have reviewed her old records, and she had an ED visit on 09/01/2022 for left shoulder fracture, ED visit on 04/01/2022 with diagnosis of tendinitis of the right shoulder treated with steroid taper.  Also, ED visit on 02/10/2022 with right shoulder pain diagnosed as tendinitis treated with narcotics.  ED visit on 06/06/2021 for leg cramps with normal electrolytes.  On 4/69/6295 metabolic panel showed creatinine of 2.47.  Because of renal insufficiency, nonsteroidal anti-inflammatory drugs are contraindicated.  {Document critical care time when appropriate:1} {Document review of labs and clinical decision tools ie heart score, Chads2Vasc2 etc:1}  {Document your independent review of radiology images, and any outside records:1} {Document your discussion with family members, caretakers, and with consultants:1} {Document social determinants of health affecting pt's care:1} {Document your decision making why or why not admission, treatments were needed:1} Final Clinical Impression(s) / ED Diagnoses Final diagnoses:  None    Rx / DC Orders ED Discharge Orders     None

## 2022-09-20 ENCOUNTER — Emergency Department (HOSPITAL_BASED_OUTPATIENT_CLINIC_OR_DEPARTMENT_OTHER): Payer: 59

## 2022-09-20 DIAGNOSIS — M79601 Pain in right arm: Secondary | ICD-10-CM | POA: Diagnosis not present

## 2022-09-20 LAB — BASIC METABOLIC PANEL
Anion gap: 9 (ref 5–15)
BUN: 33 mg/dL — ABNORMAL HIGH (ref 8–23)
CO2: 23 mmol/L (ref 22–32)
Calcium: 8.5 mg/dL — ABNORMAL LOW (ref 8.9–10.3)
Chloride: 102 mmol/L (ref 98–111)
Creatinine, Ser: 2.85 mg/dL — ABNORMAL HIGH (ref 0.44–1.00)
GFR, Estimated: 17 mL/min — ABNORMAL LOW (ref >60)
Glucose, Bld: 194 mg/dL — ABNORMAL HIGH (ref 70–99)
Potassium: 4.4 mmol/L (ref 3.5–5.1)
Sodium: 134 mmol/L — ABNORMAL LOW (ref 135–145)

## 2022-09-20 LAB — CBC WITH DIFFERENTIAL/PLATELET
Abs Immature Granulocytes: 0.04 10*3/uL (ref 0.00–0.07)
Basophils Absolute: 0 10*3/uL (ref 0.0–0.1)
Basophils Relative: 0 %
Eosinophils Absolute: 0.2 10*3/uL (ref 0.0–0.5)
Eosinophils Relative: 2 %
HCT: 24.5 % — ABNORMAL LOW (ref 36.0–46.0)
Hemoglobin: 7.8 g/dL — ABNORMAL LOW (ref 12.0–15.0)
Immature Granulocytes: 0 %
Lymphocytes Relative: 21 %
Lymphs Abs: 2.1 10*3/uL (ref 0.7–4.0)
MCH: 28.5 pg (ref 26.0–34.0)
MCHC: 31.8 g/dL (ref 30.0–36.0)
MCV: 89.4 fL (ref 80.0–100.0)
Monocytes Absolute: 0.5 10*3/uL (ref 0.1–1.0)
Monocytes Relative: 5 %
Neutro Abs: 7.5 10*3/uL (ref 1.7–7.7)
Neutrophils Relative %: 72 %
Platelets: 413 10*3/uL — ABNORMAL HIGH (ref 150–400)
RBC: 2.74 MIL/uL — ABNORMAL LOW (ref 3.87–5.11)
RDW: 14.5 % (ref 11.5–15.5)
WBC: 10.4 10*3/uL (ref 4.0–10.5)
nRBC: 0 % (ref 0.0–0.2)

## 2022-09-20 LAB — MAGNESIUM: Magnesium: 2.6 mg/dL — ABNORMAL HIGH (ref 1.7–2.4)

## 2022-09-20 LAB — CBG MONITORING, ED: Glucose-Capillary: 180 mg/dL — ABNORMAL HIGH (ref 70–99)

## 2022-09-20 MED ORDER — PREDNISONE 50 MG PO TABS: 50.0000 mg | ORAL_TABLET | Freq: Every day | ORAL | 0 refills | Status: DC

## 2022-09-20 MED ORDER — ALBUTEROL SULFATE HFA 108 (90 BASE) MCG/ACT IN AERS: 2.0000 | INHALATION_SPRAY | RESPIRATORY_TRACT | Status: DC | PRN

## 2022-09-20 MED ORDER — IPRATROPIUM-ALBUTEROL 0.5-2.5 (3) MG/3ML IN SOLN
3.0000 mL | Freq: Once | RESPIRATORY_TRACT | Status: AC
Start: 2022-09-20 — End: 2022-09-20
  Administered 2022-09-20: 3 mL via RESPIRATORY_TRACT
  Filled 2022-09-20: qty 3

## 2022-09-20 MED ORDER — ALBUTEROL SULFATE HFA 108 (90 BASE) MCG/ACT IN AERS: 2.0000 | INHALATION_SPRAY | RESPIRATORY_TRACT | 0 refills | Status: DC | PRN

## 2022-09-20 MED ORDER — HYDROCODONE-ACETAMINOPHEN 5-325 MG PO TABS: 1.0000 | ORAL_TABLET | Freq: Four times a day (QID) | ORAL | 0 refills | Status: DC | PRN

## 2022-09-20 NOTE — Discharge Instructions (Addendum)
I believe that your shoulder pain is from something which is a cousin to gout.  The prednisone which you have been prescribed should help that, as well as your cough.    Apply ice to your shoulder.  Ice to be applied for 30 minutes at a time, 4 times a day.  When you see the orthopedic doctor on Friday, have them look at your right shoulder as well.  You may use the inhaler every 4 hours as needed to help control cough and to help with your breathing.

## 2022-10-04 ENCOUNTER — Encounter (HOSPITAL_COMMUNITY): Payer: 59

## 2022-10-06 ENCOUNTER — Other Ambulatory Visit: Payer: Self-pay | Admitting: Student

## 2022-10-06 ENCOUNTER — Encounter (HOSPITAL_COMMUNITY)
Admission: RE | Admit: 2022-10-06 | Discharge: 2022-10-06 | Disposition: A | Discharge status: Home / Self Care | Payer: 59 | Source: Ambulatory Visit | Attending: Nephrology | Admitting: Nephrology

## 2022-10-06 VITALS — BP 174/78 | HR 84 | Temp 97.7°F

## 2022-10-06 DIAGNOSIS — I129 Hypertensive chronic kidney disease with stage 1 through stage 4 chronic kidney disease, or unspecified chronic kidney disease: Secondary | ICD-10-CM | POA: Diagnosis not present

## 2022-10-06 DIAGNOSIS — D631 Anemia in chronic kidney disease: Secondary | ICD-10-CM | POA: Diagnosis present

## 2022-10-06 DIAGNOSIS — N183 Chronic kidney disease, stage 3 unspecified: Secondary | ICD-10-CM | POA: Insufficient documentation

## 2022-10-06 LAB — IRON AND TIBC
Iron: 55 ug/dL (ref 28–170)
Saturation Ratios: 19 % (ref 10.4–31.8)
TIBC: 284 ug/dL (ref 250–450)
UIBC: 229 ug/dL

## 2022-10-06 LAB — POCT HEMOGLOBIN-HEMACUE: Hemoglobin: 8.6 g/dL — ABNORMAL LOW (ref 12.0–15.0)

## 2022-10-06 LAB — FERRITIN: Ferritin: 522 ng/mL — ABNORMAL HIGH (ref 11–307)

## 2022-10-06 MED ORDER — EPOETIN ALFA-EPBX 10000 UNIT/ML IJ SOLN
INTRAMUSCULAR | Status: AC
  Filled 2022-10-06: qty 2

## 2022-10-06 MED ORDER — EPOETIN ALFA-EPBX 10000 UNIT/ML IJ SOLN
15000.0000 [IU] | INTRAMUSCULAR | Status: DC
  Administered 2022-10-06: 15000 [IU] via SUBCUTANEOUS

## 2022-10-09 ENCOUNTER — Ambulatory Visit: Payer: 59 | Admitting: Podiatry

## 2022-10-18 ENCOUNTER — Encounter (HOSPITAL_COMMUNITY): Payer: 59

## 2022-10-20 ENCOUNTER — Encounter (HOSPITAL_COMMUNITY)
Admission: RE | Admit: 2022-10-20 | Discharge: 2022-10-20 | Disposition: A | Discharge status: Home / Self Care | Payer: 59 | Source: Ambulatory Visit | Attending: Nephrology | Admitting: Nephrology

## 2022-10-20 ENCOUNTER — Other Ambulatory Visit (HOSPITAL_COMMUNITY): Payer: Self-pay | Admitting: *Deleted

## 2022-10-20 VITALS — BP 157/78 | HR 87 | Temp 97.3°F | Resp 18

## 2022-10-20 DIAGNOSIS — N183 Chronic kidney disease, stage 3 unspecified: Secondary | ICD-10-CM | POA: Insufficient documentation

## 2022-10-20 DIAGNOSIS — N184 Chronic kidney disease, stage 4 (severe): Secondary | ICD-10-CM | POA: Insufficient documentation

## 2022-10-20 DIAGNOSIS — D631 Anemia in chronic kidney disease: Secondary | ICD-10-CM | POA: Diagnosis present

## 2022-10-20 LAB — POCT HEMOGLOBIN-HEMACUE: Hemoglobin: 8.2 g/dL — ABNORMAL LOW (ref 12.0–15.0)

## 2022-10-20 MED ORDER — EPOETIN ALFA-EPBX 10000 UNIT/ML IJ SOLN
INTRAMUSCULAR | Status: AC
  Administered 2022-10-20: 15000 [IU] via SUBCUTANEOUS
  Filled 2022-10-20: qty 2

## 2022-10-20 MED ORDER — EPOETIN ALFA-EPBX 10000 UNIT/ML IJ SOLN: 15000.0000 [IU] | INTRAMUSCULAR | Status: DC

## 2022-11-03 ENCOUNTER — Encounter (HOSPITAL_COMMUNITY): Payer: 59

## 2022-11-06 ENCOUNTER — Encounter (HOSPITAL_COMMUNITY): Payer: 59

## 2022-11-07 ENCOUNTER — Encounter (HOSPITAL_COMMUNITY)
Admission: RE | Admit: 2022-11-07 | Discharge: 2022-11-07 | Disposition: A | Discharge status: Home / Self Care | Payer: 59 | Source: Ambulatory Visit | Attending: Nephrology | Admitting: Nephrology

## 2022-11-07 VITALS — BP 189/58 | HR 63 | Temp 98.1°F | Resp 18 | Wt 225.0 lb

## 2022-11-07 DIAGNOSIS — D631 Anemia in chronic kidney disease: Secondary | ICD-10-CM | POA: Diagnosis not present

## 2022-11-07 DIAGNOSIS — N184 Chronic kidney disease, stage 4 (severe): Secondary | ICD-10-CM | POA: Diagnosis not present

## 2022-11-07 DIAGNOSIS — N183 Chronic kidney disease, stage 3 unspecified: Secondary | ICD-10-CM | POA: Diagnosis not present

## 2022-11-07 LAB — POCT HEMOGLOBIN-HEMACUE: Hemoglobin: 9.3 g/dL — ABNORMAL LOW (ref 12.0–15.0)

## 2022-11-07 MED ORDER — CLONIDINE HCL 0.1 MG PO TABS
ORAL_TABLET | ORAL | Status: AC
  Filled 2022-11-07: qty 1

## 2022-11-07 MED ORDER — SODIUM CHLORIDE 0.9 % IV SOLN
510.0000 mg | INTRAVENOUS | Status: DC
  Administered 2022-11-07: 510 mg via INTRAVENOUS
  Filled 2022-11-07: qty 510

## 2022-11-07 MED ORDER — EPOETIN ALFA-EPBX 10000 UNIT/ML IJ SOLN: 15000.0000 [IU] | INTRAMUSCULAR | Status: DC

## 2022-11-07 MED ORDER — EPOETIN ALFA-EPBX 10000 UNIT/ML IJ SOLN
INTRAMUSCULAR | Status: AC
  Administered 2022-11-07: 15000 [IU] via SUBCUTANEOUS
  Filled 2022-11-07: qty 2

## 2022-11-07 MED ORDER — CLONIDINE HCL 0.1 MG PO TABS
0.1000 mg | ORAL_TABLET | Freq: Once | ORAL | Status: AC | PRN
  Administered 2022-11-07: 0.1 mg via ORAL

## 2022-11-07 NOTE — Progress Notes (Signed)
Arrived at 1100. Pt received first dose of IV Feraheme, also scheduled for Retacrit. Blood pressure elevated throughout visit. PO Clonidine given at 1230. BP 189/58 at 1250. Pt verbalized taking all BP meds this morning. Denied dizziness and headache. No facial drooping or slurred speech noted. Called Dr. Louie Boston office to inform. Per Cathie Beams for Dr. Carolin Sicks, okay to give 15,000 subcu Retacrit today. Pt verbalized understanding to seek treatment with new or worsening symptoms.

## 2022-11-20 ENCOUNTER — Encounter (HOSPITAL_COMMUNITY): Payer: 59

## 2022-11-21 ENCOUNTER — Encounter (HOSPITAL_COMMUNITY)
Admission: RE | Admit: 2022-11-21 | Discharge: 2022-11-21 | Disposition: A | Discharge status: Home / Self Care | Payer: 59 | Source: Ambulatory Visit | Attending: Nephrology | Admitting: Nephrology

## 2022-11-21 VITALS — BP 178/86 | HR 73 | Temp 98.5°F | Resp 17 | Wt 225.0 lb

## 2022-11-21 DIAGNOSIS — D631 Anemia in chronic kidney disease: Secondary | ICD-10-CM | POA: Insufficient documentation

## 2022-11-21 DIAGNOSIS — N184 Chronic kidney disease, stage 4 (severe): Secondary | ICD-10-CM | POA: Diagnosis present

## 2022-11-21 DIAGNOSIS — N183 Chronic kidney disease, stage 3 unspecified: Secondary | ICD-10-CM | POA: Insufficient documentation

## 2022-11-21 MED ORDER — EPOETIN ALFA-EPBX 10000 UNIT/ML IJ SOLN
INTRAMUSCULAR | Status: AC
  Filled 2022-11-21: qty 2

## 2022-11-21 MED ORDER — SODIUM CHLORIDE 0.9 % IV SOLN
510.0000 mg | INTRAVENOUS | Status: AC
  Administered 2022-11-21: 510 mg via INTRAVENOUS
  Filled 2022-11-21: qty 510

## 2022-11-21 MED ORDER — EPOETIN ALFA-EPBX 10000 UNIT/ML IJ SOLN
15000.0000 [IU] | INTRAMUSCULAR | Status: DC
  Administered 2022-11-21: 15000 [IU] via SUBCUTANEOUS

## 2022-11-22 LAB — POCT HEMOGLOBIN-HEMACUE: Hemoglobin: 9.5 g/dL — ABNORMAL LOW (ref 12.0–15.0)

## 2022-12-05 ENCOUNTER — Encounter (HOSPITAL_COMMUNITY)
Admission: RE | Admit: 2022-12-05 | Discharge: 2022-12-05 | Disposition: A | Discharge status: Home / Self Care | Payer: 59 | Source: Ambulatory Visit | Attending: Nephrology | Admitting: Nephrology

## 2022-12-05 VITALS — BP 151/77 | HR 93 | Temp 97.3°F | Resp 18

## 2022-12-05 DIAGNOSIS — N183 Chronic kidney disease, stage 3 unspecified: Secondary | ICD-10-CM | POA: Diagnosis not present

## 2022-12-05 DIAGNOSIS — N184 Chronic kidney disease, stage 4 (severe): Secondary | ICD-10-CM | POA: Diagnosis not present

## 2022-12-05 LAB — IRON AND TIBC
Iron: 160 ug/dL (ref 28–170)
Saturation Ratios: 56 % — ABNORMAL HIGH (ref 10.4–31.8)
TIBC: 286 ug/dL (ref 250–450)
UIBC: 126 ug/dL

## 2022-12-05 LAB — POCT HEMOGLOBIN-HEMACUE: Hemoglobin: 10.9 g/dL — ABNORMAL LOW (ref 12.0–15.0)

## 2022-12-05 LAB — FERRITIN: Ferritin: 1018 ng/mL — ABNORMAL HIGH (ref 11–307)

## 2022-12-05 MED ORDER — EPOETIN ALFA-EPBX 10000 UNIT/ML IJ SOLN
INTRAMUSCULAR | Status: AC
  Filled 2022-12-05: qty 2

## 2022-12-05 MED ORDER — EPOETIN ALFA-EPBX 10000 UNIT/ML IJ SOLN
15000.0000 [IU] | INTRAMUSCULAR | Status: DC
  Administered 2022-12-05: 15000 [IU] via SUBCUTANEOUS

## 2022-12-18 ENCOUNTER — Encounter (HOSPITAL_COMMUNITY): Payer: Self-pay

## 2022-12-19 ENCOUNTER — Encounter (HOSPITAL_COMMUNITY)
Admission: RE | Admit: 2022-12-19 | Discharge: 2022-12-19 | Disposition: A | Discharge status: Home / Self Care | Payer: 59 | Source: Ambulatory Visit | Attending: Nephrology | Admitting: Nephrology

## 2022-12-19 VITALS — BP 173/69 | HR 99 | Temp 97.5°F | Resp 18

## 2022-12-19 DIAGNOSIS — N183 Chronic kidney disease, stage 3 unspecified: Secondary | ICD-10-CM | POA: Diagnosis present

## 2022-12-19 DIAGNOSIS — D631 Anemia in chronic kidney disease: Secondary | ICD-10-CM | POA: Diagnosis present

## 2022-12-19 LAB — POCT HEMOGLOBIN-HEMACUE: Hemoglobin: 11 g/dL — ABNORMAL LOW (ref 12.0–15.0)

## 2022-12-19 MED ORDER — EPOETIN ALFA-EPBX 10000 UNIT/ML IJ SOLN
INTRAMUSCULAR | Status: AC
  Filled 2022-12-19: qty 2

## 2022-12-19 MED ORDER — EPOETIN ALFA-EPBX 10000 UNIT/ML IJ SOLN
15000.0000 [IU] | INTRAMUSCULAR | Status: DC
  Administered 2022-12-19: 15000 [IU] via SUBCUTANEOUS

## 2023-01-01 ENCOUNTER — Encounter (HOSPITAL_COMMUNITY): Payer: Self-pay

## 2023-01-02 ENCOUNTER — Encounter (HOSPITAL_COMMUNITY): Payer: Self-pay

## 2023-01-02 ENCOUNTER — Encounter (HOSPITAL_COMMUNITY): Payer: 59

## 2023-01-03 ENCOUNTER — Encounter (HOSPITAL_COMMUNITY)
Admission: RE | Admit: 2023-01-03 | Discharge: 2023-01-03 | Disposition: A | Discharge status: Home / Self Care | Payer: 59 | Source: Ambulatory Visit | Attending: Nephrology | Admitting: Nephrology

## 2023-01-03 ENCOUNTER — Encounter (HOSPITAL_COMMUNITY): Payer: Self-pay

## 2023-01-03 VITALS — BP 171/77 | HR 66 | Temp 98.3°F | Resp 17

## 2023-01-03 DIAGNOSIS — D631 Anemia in chronic kidney disease: Secondary | ICD-10-CM

## 2023-01-03 DIAGNOSIS — N183 Chronic kidney disease, stage 3 unspecified: Secondary | ICD-10-CM | POA: Diagnosis not present

## 2023-01-03 LAB — IRON AND TIBC
Iron: 124 ug/dL (ref 28–170)
Saturation Ratios: 41 % — ABNORMAL HIGH (ref 10.4–31.8)
TIBC: 304 ug/dL (ref 250–450)
UIBC: 180 ug/dL

## 2023-01-03 LAB — FERRITIN: Ferritin: 815 ng/mL — ABNORMAL HIGH (ref 11–307)

## 2023-01-03 LAB — POCT HEMOGLOBIN-HEMACUE: Hemoglobin: 11.3 g/dL — ABNORMAL LOW (ref 12.0–15.0)

## 2023-01-03 MED ORDER — EPOETIN ALFA-EPBX 10000 UNIT/ML IJ SOLN
15000.0000 [IU] | INTRAMUSCULAR | Status: DC
  Administered 2023-01-03: 15000 [IU] via SUBCUTANEOUS

## 2023-01-03 MED ORDER — EPOETIN ALFA-EPBX 10000 UNIT/ML IJ SOLN
INTRAMUSCULAR | Status: AC
  Filled 2023-01-03: qty 2

## 2023-01-17 ENCOUNTER — Encounter (HOSPITAL_COMMUNITY)
Admission: RE | Admit: 2023-01-17 | Discharge: 2023-01-17 | Disposition: A | Discharge status: Home / Self Care | Payer: 59 | Source: Ambulatory Visit | Attending: Nephrology | Admitting: Nephrology

## 2023-01-17 VITALS — BP 167/80 | HR 96 | Temp 97.5°F | Resp 17

## 2023-01-17 DIAGNOSIS — N183 Chronic kidney disease, stage 3 unspecified: Secondary | ICD-10-CM | POA: Diagnosis not present

## 2023-01-17 DIAGNOSIS — D631 Anemia in chronic kidney disease: Secondary | ICD-10-CM

## 2023-01-17 LAB — POCT HEMOGLOBIN-HEMACUE: Hemoglobin: 9.6 g/dL — ABNORMAL LOW (ref 12.0–15.0)

## 2023-01-17 MED ORDER — EPOETIN ALFA-EPBX 10000 UNIT/ML IJ SOLN
15000.0000 [IU] | INTRAMUSCULAR | Status: DC
  Administered 2023-01-17: 15000 [IU] via SUBCUTANEOUS

## 2023-01-17 MED ORDER — EPOETIN ALFA-EPBX 10000 UNIT/ML IJ SOLN
INTRAMUSCULAR | Status: AC
  Filled 2023-01-17: qty 2

## 2023-01-25 ENCOUNTER — Other Ambulatory Visit: Payer: Self-pay | Admitting: Student

## 2023-01-25 DIAGNOSIS — Z1231 Encounter for screening mammogram for malignant neoplasm of breast: Secondary | ICD-10-CM

## 2023-01-31 ENCOUNTER — Ambulatory Visit (HOSPITAL_COMMUNITY)
Admission: RE | Admit: 2023-01-31 | Discharge: 2023-01-31 | Disposition: A | Discharge status: Home / Self Care | Payer: 59 | Source: Ambulatory Visit | Attending: Nephrology | Admitting: Nephrology

## 2023-01-31 VITALS — BP 165/76 | HR 89 | Temp 97.6°F | Resp 16

## 2023-01-31 DIAGNOSIS — D631 Anemia in chronic kidney disease: Secondary | ICD-10-CM | POA: Diagnosis present

## 2023-01-31 DIAGNOSIS — N183 Chronic kidney disease, stage 3 unspecified: Secondary | ICD-10-CM | POA: Diagnosis present

## 2023-01-31 LAB — IRON AND TIBC
Iron: 116 ug/dL (ref 28–170)
Saturation Ratios: 40 % — ABNORMAL HIGH (ref 10.4–31.8)
TIBC: 288 ug/dL (ref 250–450)
UIBC: 172 ug/dL

## 2023-01-31 LAB — FERRITIN: Ferritin: 699 ng/mL — ABNORMAL HIGH (ref 11–307)

## 2023-01-31 LAB — POCT HEMOGLOBIN-HEMACUE: Hemoglobin: 11.1 g/dL — ABNORMAL LOW (ref 12.0–15.0)

## 2023-01-31 IMAGING — CT CT HEAD W/O CM
3 series · 15 of 47 positions shown, 18 images · non-contrast
Comparison: CT head 03/07/2018

CLINICAL DATA: Dizziness

EXAM:
CT HEAD WITHOUT CONTRAST
TECHNIQUE: Contiguous axial images were obtained from the base of the skull
through the vertex without intravenous contrast.

[Series 2: head wo · axial · 0.42mm/px · z∈[-594,-469]mm · 9 of 30 slices shown, 12 images]
[im 3/30  brain]
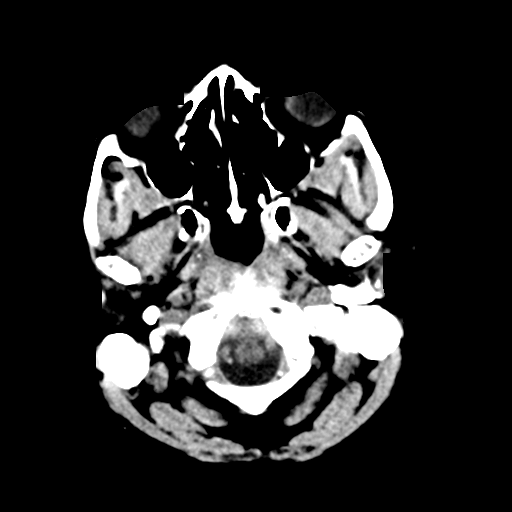
[im 3/30  bone]
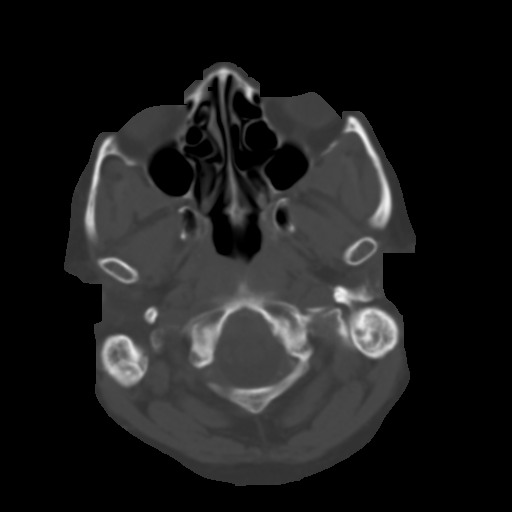
[im 6/30  brain]
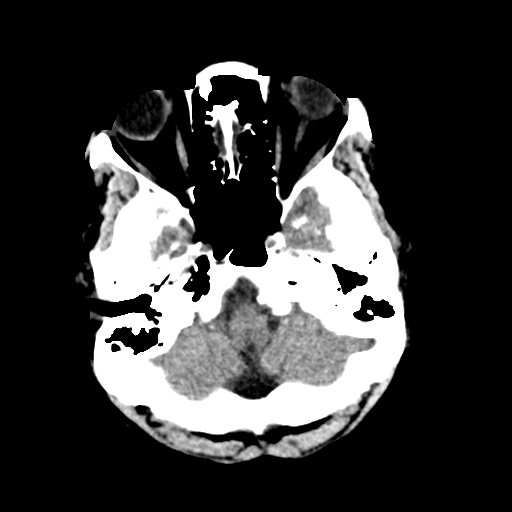
[im 9/30  brain]
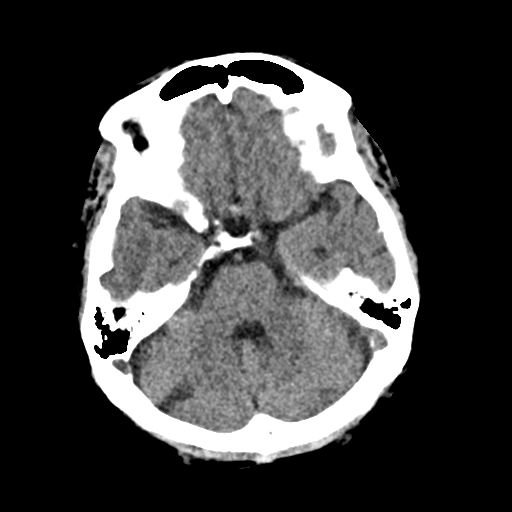
[im 12/30  brain]
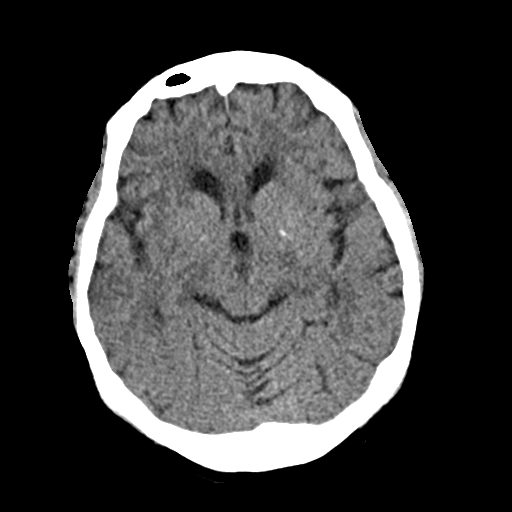
[im 16/30  brain]
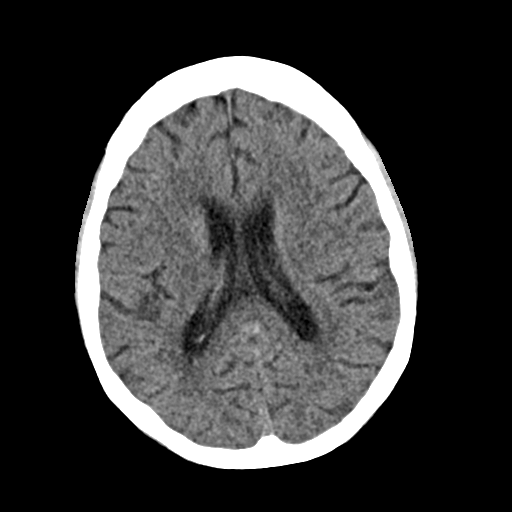
[im 16/30  bone]
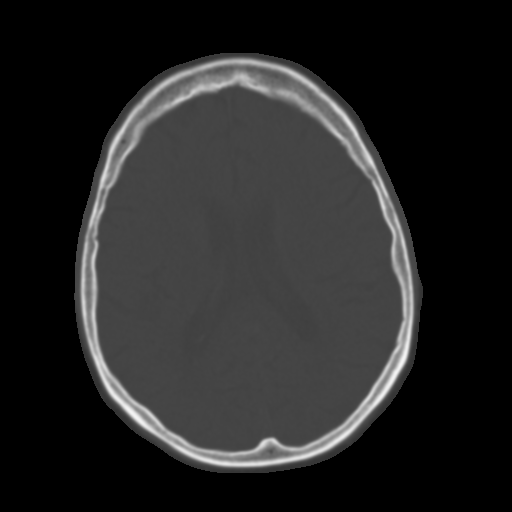
[im 19/30  brain]
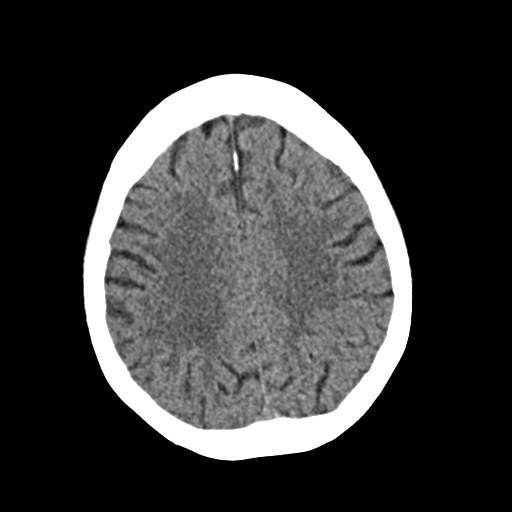
[im 22/30  brain]
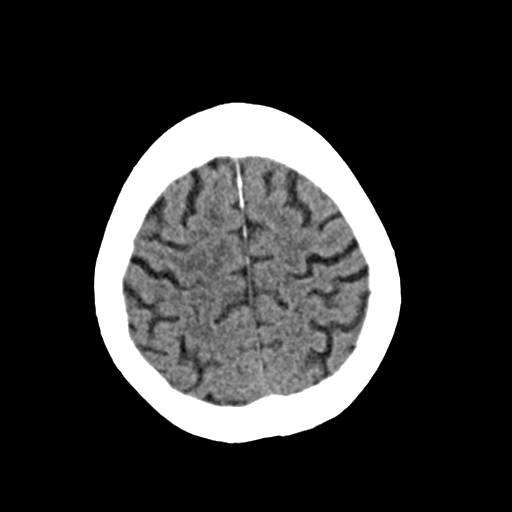
[im 25/30  brain]
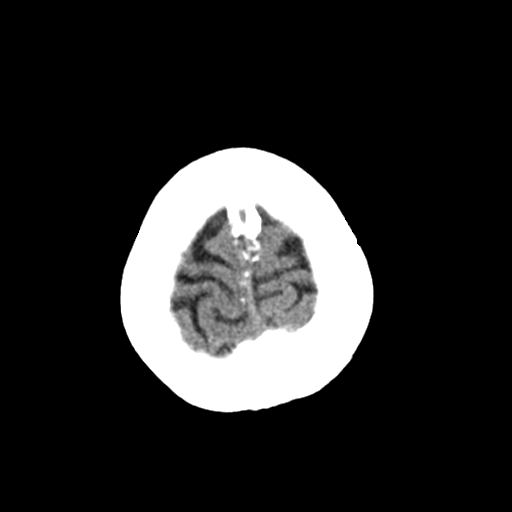
[im 28/30  brain]
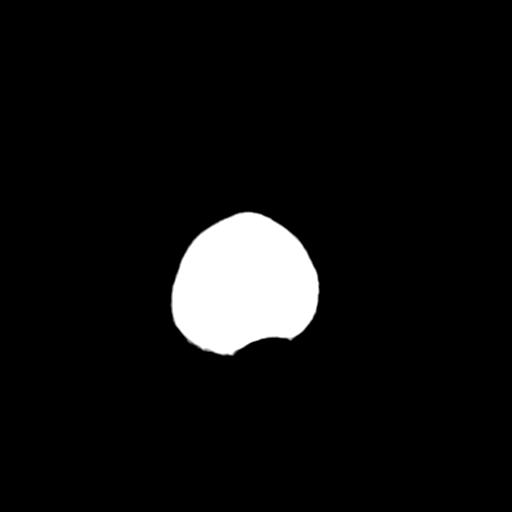
[im 28/30  bone]
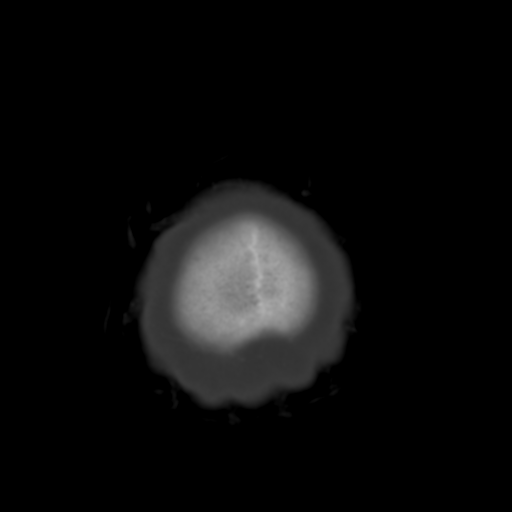

[Series 4: coronal soft · coronal · 0.29mm/px · 3 of 63 slices shown]
[im 21/63  brain]
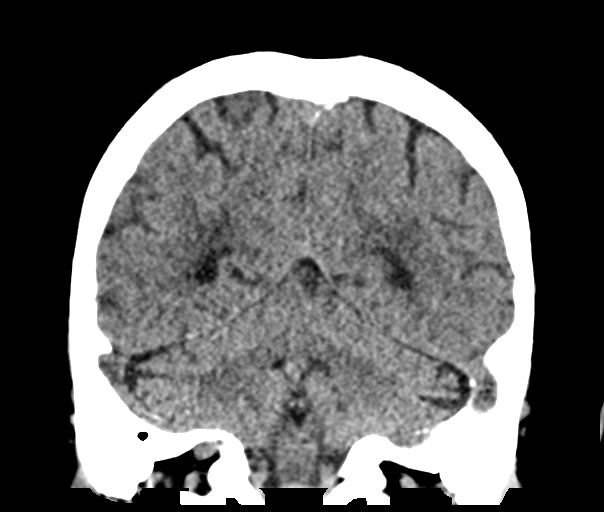
[im 28/63  brain]
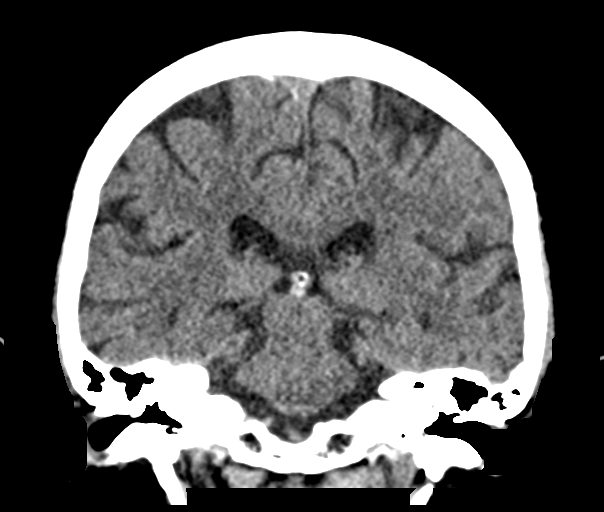
[im 35/63  brain]
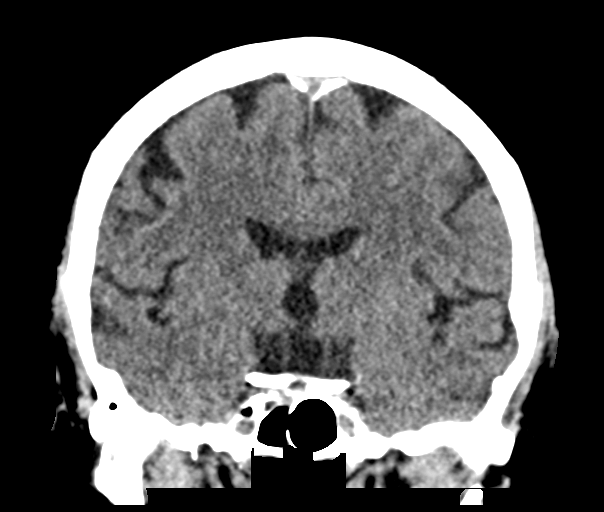

[Series 5: sag soft · sagittal · 0.29mm/px · 3 of 56 slices shown]
[im 19/56  brain]
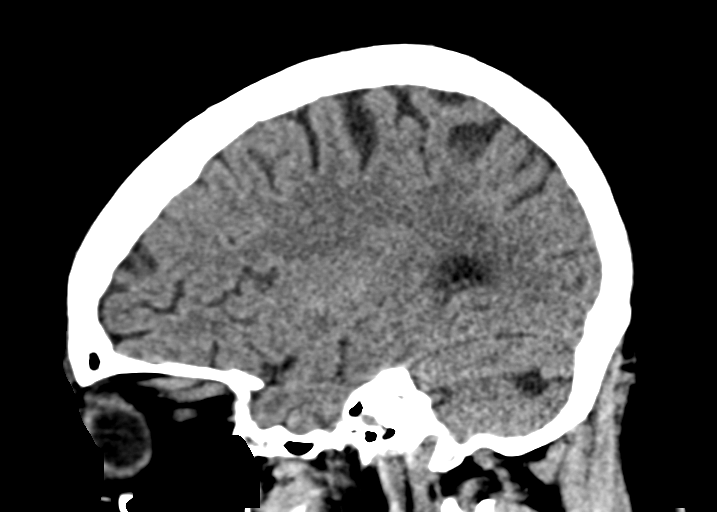
[im 28/56  brain]
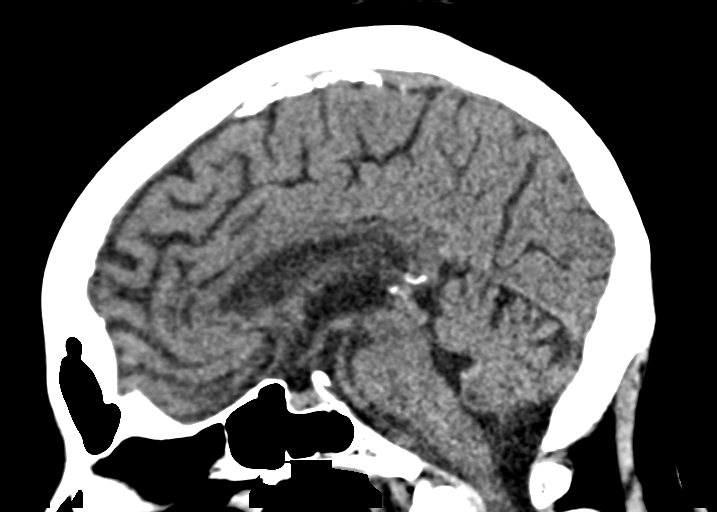
[im 37/56  brain]
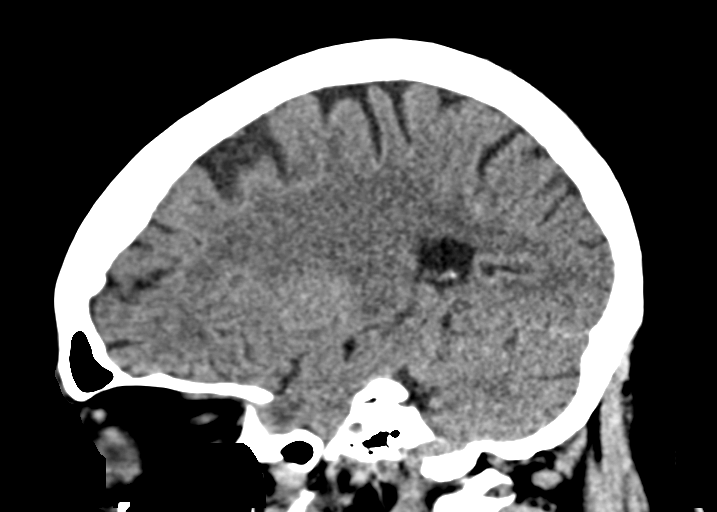

[15 of 47 positions shown; findings below may reference images not displayed]

FINDINGS: Brain: Mild generalized atrophy.  Mild white matter hypodensity.

Negative for acute infarct, hemorrhage, mass

Vascular: Negative for hyperdense vessel

Skull: Negative

Sinuses/Orbits: Negative

Other: None
IMPRESSION: No acute abnormality. Mild atrophy and mild chronic microvascular
ischemic change in the white matter.

## 2023-01-31 MED ORDER — EPOETIN ALFA-EPBX 10000 UNIT/ML IJ SOLN
15000.0000 [IU] | INTRAMUSCULAR | Status: DC
  Administered 2023-01-31: 15000 [IU] via SUBCUTANEOUS

## 2023-01-31 MED ORDER — EPOETIN ALFA-EPBX 10000 UNIT/ML IJ SOLN
INTRAMUSCULAR | Status: AC
  Filled 2023-01-31: qty 2

## 2023-02-14 ENCOUNTER — Ambulatory Visit (HOSPITAL_COMMUNITY)
Admission: RE | Admit: 2023-02-14 | Discharge: 2023-02-14 | Disposition: A | Discharge status: Home / Self Care | Payer: 59 | Source: Ambulatory Visit | Attending: Nephrology | Admitting: Nephrology

## 2023-02-14 VITALS — BP 178/70 | HR 92 | Temp 99.1°F | Resp 17

## 2023-02-14 DIAGNOSIS — D631 Anemia in chronic kidney disease: Secondary | ICD-10-CM | POA: Insufficient documentation

## 2023-02-14 DIAGNOSIS — N183 Chronic kidney disease, stage 3 unspecified: Secondary | ICD-10-CM | POA: Insufficient documentation

## 2023-02-14 LAB — POCT HEMOGLOBIN-HEMACUE: Hemoglobin: 10.3 g/dL — ABNORMAL LOW (ref 12.0–15.0)

## 2023-02-14 MED ORDER — EPOETIN ALFA-EPBX 10000 UNIT/ML IJ SOLN: 15000.0000 [IU] | INTRAMUSCULAR | Status: DC

## 2023-02-14 MED ORDER — EPOETIN ALFA-EPBX 10000 UNIT/ML IJ SOLN
INTRAMUSCULAR | Status: AC
  Administered 2023-02-14: 15000 [IU] via SUBCUTANEOUS
  Filled 2023-02-14: qty 2

## 2023-02-28 ENCOUNTER — Encounter (HOSPITAL_COMMUNITY)
Admission: RE | Admit: 2023-02-28 | Discharge: 2023-02-28 | Disposition: A | Discharge status: Home / Self Care | Payer: 59 | Source: Ambulatory Visit | Attending: Nephrology | Admitting: Nephrology

## 2023-02-28 VITALS — BP 178/74 | HR 79 | Temp 98.0°F | Resp 17

## 2023-02-28 DIAGNOSIS — D631 Anemia in chronic kidney disease: Secondary | ICD-10-CM | POA: Diagnosis present

## 2023-02-28 DIAGNOSIS — N183 Chronic kidney disease, stage 3 unspecified: Secondary | ICD-10-CM | POA: Diagnosis present

## 2023-02-28 LAB — POCT HEMOGLOBIN-HEMACUE: Hemoglobin: 11.1 g/dL — ABNORMAL LOW (ref 12.0–15.0)

## 2023-02-28 LAB — IRON AND TIBC
Iron: 135 ug/dL (ref 28–170)
Saturation Ratios: 49 % — ABNORMAL HIGH (ref 10.4–31.8)
TIBC: 279 ug/dL (ref 250–450)
UIBC: 144 ug/dL

## 2023-02-28 LAB — FERRITIN: Ferritin: 857 ng/mL — ABNORMAL HIGH (ref 11–307)

## 2023-02-28 MED ORDER — EPOETIN ALFA-EPBX 10000 UNIT/ML IJ SOLN
INTRAMUSCULAR | Status: AC
  Administered 2023-02-28: 15000 [IU] via SUBCUTANEOUS
  Filled 2023-02-28: qty 2

## 2023-02-28 MED ORDER — EPOETIN ALFA-EPBX 10000 UNIT/ML IJ SOLN: 15000.0000 [IU] | INTRAMUSCULAR | Status: DC

## 2023-03-14 ENCOUNTER — Encounter (HOSPITAL_COMMUNITY)
Admission: RE | Admit: 2023-03-14 | Discharge: 2023-03-14 | Disposition: A | Discharge status: Home / Self Care | Payer: 59 | Source: Ambulatory Visit | Attending: Nephrology | Admitting: Nephrology

## 2023-03-14 ENCOUNTER — Ambulatory Visit
Admission: RE | Admit: 2023-03-14 | Discharge: 2023-03-14 | Disposition: A | Discharge status: Home / Self Care | Payer: 59 | Source: Ambulatory Visit | Attending: Student | Admitting: Student

## 2023-03-14 VITALS — BP 156/76 | HR 86 | Temp 97.1°F | Resp 17

## 2023-03-14 DIAGNOSIS — N183 Chronic kidney disease, stage 3 unspecified: Secondary | ICD-10-CM | POA: Diagnosis not present

## 2023-03-14 DIAGNOSIS — Z1231 Encounter for screening mammogram for malignant neoplasm of breast: Secondary | ICD-10-CM

## 2023-03-14 DIAGNOSIS — D631 Anemia in chronic kidney disease: Secondary | ICD-10-CM

## 2023-03-14 LAB — POCT HEMOGLOBIN-HEMACUE: Hemoglobin: 10.4 g/dL — ABNORMAL LOW (ref 12.0–15.0)

## 2023-03-14 MED ORDER — EPOETIN ALFA-EPBX 10000 UNIT/ML IJ SOLN
INTRAMUSCULAR | Status: AC
  Filled 2023-03-14: qty 2

## 2023-03-14 MED ORDER — EPOETIN ALFA-EPBX 10000 UNIT/ML IJ SOLN
15000.0000 [IU] | INTRAMUSCULAR | Status: DC
  Administered 2023-03-14: 15000 [IU] via SUBCUTANEOUS

## 2023-03-28 ENCOUNTER — Encounter (HOSPITAL_COMMUNITY): Payer: 59

## 2023-04-04 ENCOUNTER — Encounter (HOSPITAL_COMMUNITY)
Admission: RE | Admit: 2023-04-04 | Discharge: 2023-04-04 | Disposition: A | Discharge status: Home / Self Care | Payer: 59 | Source: Ambulatory Visit | Attending: Nephrology | Admitting: Nephrology

## 2023-04-04 VITALS — BP 156/74 | HR 94 | Temp 97.6°F | Resp 17

## 2023-04-04 DIAGNOSIS — N183 Chronic kidney disease, stage 3 unspecified: Secondary | ICD-10-CM | POA: Diagnosis not present

## 2023-04-04 DIAGNOSIS — D631 Anemia in chronic kidney disease: Secondary | ICD-10-CM | POA: Insufficient documentation

## 2023-04-04 LAB — IRON AND TIBC
Iron: 85 ug/dL (ref 28–170)
Saturation Ratios: 30 % (ref 10.4–31.8)
TIBC: 287 ug/dL (ref 250–450)
UIBC: 202 ug/dL

## 2023-04-04 LAB — FERRITIN: Ferritin: 708 ng/mL — ABNORMAL HIGH (ref 11–307)

## 2023-04-04 LAB — POCT HEMOGLOBIN-HEMACUE: Hemoglobin: 10 g/dL — ABNORMAL LOW (ref 12.0–15.0)

## 2023-04-04 MED ORDER — EPOETIN ALFA-EPBX 40000 UNIT/ML IJ SOLN
INTRAMUSCULAR | Status: AC
  Administered 2023-04-04: 15000 [IU] via SUBCUTANEOUS
  Filled 2023-04-04: qty 1

## 2023-04-04 MED ORDER — EPOETIN ALFA-EPBX 10000 UNIT/ML IJ SOLN: 15000.0000 [IU] | INTRAMUSCULAR | Status: DC

## 2023-04-11 ENCOUNTER — Encounter (HOSPITAL_COMMUNITY): Payer: 59

## 2023-04-18 ENCOUNTER — Encounter (HOSPITAL_COMMUNITY)
Admission: RE | Admit: 2023-04-18 | Discharge: 2023-04-18 | Disposition: A | Discharge status: Home / Self Care | Payer: 59 | Source: Ambulatory Visit | Attending: Nephrology | Admitting: Nephrology

## 2023-04-18 VITALS — BP 172/91 | HR 69 | Temp 97.4°F | Resp 16

## 2023-04-18 DIAGNOSIS — D631 Anemia in chronic kidney disease: Secondary | ICD-10-CM | POA: Diagnosis present

## 2023-04-18 DIAGNOSIS — N183 Chronic kidney disease, stage 3 unspecified: Secondary | ICD-10-CM

## 2023-04-18 LAB — POCT HEMOGLOBIN-HEMACUE: Hemoglobin: 9.6 g/dL — ABNORMAL LOW (ref 12.0–15.0)

## 2023-04-18 MED ORDER — EPOETIN ALFA-EPBX 10000 UNIT/ML IJ SOLN
INTRAMUSCULAR | Status: AC
  Administered 2023-04-18: 15000 [IU] via SUBCUTANEOUS
  Filled 2023-04-18: qty 2

## 2023-04-18 MED ORDER — EPOETIN ALFA-EPBX 10000 UNIT/ML IJ SOLN: 15000.0000 [IU] | INTRAMUSCULAR | Status: DC

## 2023-05-02 ENCOUNTER — Encounter (HOSPITAL_COMMUNITY)
Admission: RE | Admit: 2023-05-02 | Discharge: 2023-05-02 | Disposition: A | Discharge status: Home / Self Care | Payer: 59 | Source: Ambulatory Visit | Attending: Nephrology | Admitting: Nephrology

## 2023-05-02 VITALS — BP 177/76 | HR 98 | Temp 97.6°F | Resp 17

## 2023-05-02 DIAGNOSIS — N183 Chronic kidney disease, stage 3 unspecified: Secondary | ICD-10-CM | POA: Diagnosis not present

## 2023-05-02 DIAGNOSIS — D631 Anemia in chronic kidney disease: Secondary | ICD-10-CM

## 2023-05-02 LAB — IRON AND TIBC
Iron: 61 ug/dL (ref 28–170)
Saturation Ratios: 20 % (ref 10.4–31.8)
TIBC: 305 ug/dL (ref 250–450)
UIBC: 244 ug/dL

## 2023-05-02 LAB — POCT HEMOGLOBIN-HEMACUE: Hemoglobin: 10.5 g/dL — ABNORMAL LOW (ref 12.0–15.0)

## 2023-05-02 LAB — FERRITIN: Ferritin: 1014 ng/mL — ABNORMAL HIGH (ref 11–307)

## 2023-05-02 MED ORDER — EPOETIN ALFA-EPBX 10000 UNIT/ML IJ SOLN
INTRAMUSCULAR | Status: AC
  Filled 2023-05-02: qty 2

## 2023-05-02 MED ORDER — EPOETIN ALFA-EPBX 10000 UNIT/ML IJ SOLN
15000.0000 [IU] | INTRAMUSCULAR | Status: DC
  Administered 2023-05-02: 15000 [IU] via SUBCUTANEOUS

## 2023-05-16 ENCOUNTER — Ambulatory Visit (HOSPITAL_COMMUNITY)
Admission: RE | Admit: 2023-05-16 | Discharge: 2023-05-16 | Disposition: A | Discharge status: Home / Self Care | Payer: 59 | Source: Ambulatory Visit | Attending: Nephrology | Admitting: Nephrology

## 2023-05-16 VITALS — BP 178/80 | HR 88 | Temp 97.6°F | Resp 17

## 2023-05-16 DIAGNOSIS — D631 Anemia in chronic kidney disease: Secondary | ICD-10-CM | POA: Diagnosis present

## 2023-05-16 DIAGNOSIS — N183 Chronic kidney disease, stage 3 unspecified: Secondary | ICD-10-CM | POA: Diagnosis not present

## 2023-05-16 LAB — POCT HEMOGLOBIN-HEMACUE: Hemoglobin: 9.9 g/dL — ABNORMAL LOW (ref 12.0–15.0)

## 2023-05-16 MED ORDER — EPOETIN ALFA-EPBX 10000 UNIT/ML IJ SOLN: 15000.0000 [IU] | INTRAMUSCULAR | Status: DC

## 2023-05-16 MED ORDER — EPOETIN ALFA-EPBX 10000 UNIT/ML IJ SOLN
INTRAMUSCULAR | Status: AC
  Administered 2023-05-16: 15000 [IU] via SUBCUTANEOUS
  Filled 2023-05-16: qty 2

## 2023-05-30 ENCOUNTER — Encounter (HOSPITAL_COMMUNITY)
Admission: RE | Admit: 2023-05-30 | Discharge: 2023-05-30 | Disposition: A | Discharge status: Home / Self Care | Payer: 59 | Source: Ambulatory Visit | Attending: Nephrology | Admitting: Nephrology

## 2023-05-30 VITALS — BP 173/73 | HR 94 | Temp 97.2°F | Resp 16

## 2023-05-30 DIAGNOSIS — N183 Chronic kidney disease, stage 3 unspecified: Secondary | ICD-10-CM | POA: Diagnosis not present

## 2023-05-30 DIAGNOSIS — D631 Anemia in chronic kidney disease: Secondary | ICD-10-CM | POA: Diagnosis present

## 2023-05-30 LAB — IRON AND TIBC
Iron: 74 ug/dL (ref 28–170)
Saturation Ratios: 26 % (ref 10.4–31.8)
TIBC: 280 ug/dL (ref 250–450)
UIBC: 206 ug/dL

## 2023-05-30 LAB — FERRITIN: Ferritin: 805 ng/mL — ABNORMAL HIGH (ref 11–307)

## 2023-05-30 LAB — POCT HEMOGLOBIN-HEMACUE: Hemoglobin: 9.8 g/dL — ABNORMAL LOW (ref 12.0–15.0)

## 2023-05-30 MED ORDER — EPOETIN ALFA-EPBX 10000 UNIT/ML IJ SOLN
INTRAMUSCULAR | Status: AC
  Filled 2023-05-30: qty 2

## 2023-05-30 MED ORDER — EPOETIN ALFA-EPBX 10000 UNIT/ML IJ SOLN
15000.0000 [IU] | INTRAMUSCULAR | Status: DC
  Administered 2023-05-30: 15000 [IU] via SUBCUTANEOUS

## 2023-06-13 ENCOUNTER — Encounter (HOSPITAL_COMMUNITY)
Admission: RE | Admit: 2023-06-13 | Discharge: 2023-06-13 | Disposition: A | Discharge status: Home / Self Care | Payer: 59 | Source: Ambulatory Visit | Attending: Nephrology | Admitting: Nephrology

## 2023-06-13 VITALS — BP 157/109 | HR 93 | Temp 97.7°F | Resp 17

## 2023-06-13 DIAGNOSIS — N183 Chronic kidney disease, stage 3 unspecified: Secondary | ICD-10-CM | POA: Diagnosis not present

## 2023-06-13 DIAGNOSIS — D631 Anemia in chronic kidney disease: Secondary | ICD-10-CM

## 2023-06-13 LAB — POCT HEMOGLOBIN-HEMACUE: Hemoglobin: 10.7 g/dL — ABNORMAL LOW (ref 12.0–15.0)

## 2023-06-13 MED ORDER — EPOETIN ALFA-EPBX 10000 UNIT/ML IJ SOLN: 15000.0000 [IU] | INTRAMUSCULAR | Status: DC

## 2023-06-13 MED ORDER — EPOETIN ALFA-EPBX 10000 UNIT/ML IJ SOLN
INTRAMUSCULAR | Status: AC
  Administered 2023-06-13: 15000 [IU] via SUBCUTANEOUS
  Filled 2023-06-13: qty 2

## 2023-06-20 ENCOUNTER — Emergency Department (HOSPITAL_BASED_OUTPATIENT_CLINIC_OR_DEPARTMENT_OTHER): Payer: 59

## 2023-06-20 ENCOUNTER — Other Ambulatory Visit: Payer: Self-pay

## 2023-06-20 ENCOUNTER — Emergency Department (HOSPITAL_BASED_OUTPATIENT_CLINIC_OR_DEPARTMENT_OTHER)
Admission: EM | Admit: 2023-06-20 | Discharge: 2023-06-20 | Disposition: A | Discharge status: Home / Self Care | Payer: 59 | Attending: Emergency Medicine | Admitting: Emergency Medicine

## 2023-06-20 DIAGNOSIS — R251 Tremor, unspecified: Secondary | ICD-10-CM | POA: Diagnosis present

## 2023-06-20 DIAGNOSIS — I10 Essential (primary) hypertension: Secondary | ICD-10-CM | POA: Insufficient documentation

## 2023-06-20 DIAGNOSIS — E119 Type 2 diabetes mellitus without complications: Secondary | ICD-10-CM | POA: Diagnosis not present

## 2023-06-20 DIAGNOSIS — Z79899 Other long term (current) drug therapy: Secondary | ICD-10-CM | POA: Diagnosis not present

## 2023-06-20 DIAGNOSIS — Z794 Long term (current) use of insulin: Secondary | ICD-10-CM | POA: Diagnosis not present

## 2023-06-20 LAB — CBC WITH DIFFERENTIAL/PLATELET
Abs Immature Granulocytes: 0 10*3/uL (ref 0.00–0.07)
Basophils Absolute: 0 10*3/uL (ref 0.0–0.1)
Basophils Relative: 0 %
Eosinophils Absolute: 0.2 10*3/uL (ref 0.0–0.5)
Eosinophils Relative: 3 %
HCT: 34.1 % — ABNORMAL LOW (ref 36.0–46.0)
Hemoglobin: 10.9 g/dL — ABNORMAL LOW (ref 12.0–15.0)
Immature Granulocytes: 0 %
Lymphocytes Relative: 27 %
Lymphs Abs: 1.6 10*3/uL (ref 0.7–4.0)
MCH: 29.2 pg (ref 26.0–34.0)
MCHC: 32 g/dL (ref 30.0–36.0)
MCV: 91.4 fL (ref 80.0–100.0)
Monocytes Absolute: 0.5 10*3/uL (ref 0.1–1.0)
Monocytes Relative: 8 %
Neutro Abs: 3.8 10*3/uL (ref 1.7–7.7)
Neutrophils Relative %: 62 %
Platelets: 330 10*3/uL (ref 150–400)
RBC: 3.73 MIL/uL — ABNORMAL LOW (ref 3.87–5.11)
RDW: 14.5 % (ref 11.5–15.5)
WBC: 6.1 10*3/uL (ref 4.0–10.5)
nRBC: 0 % (ref 0.0–0.2)

## 2023-06-20 LAB — COMPREHENSIVE METABOLIC PANEL
ALT: 10 U/L (ref 0–44)
AST: 20 U/L (ref 15–41)
Albumin: 3.1 g/dL — ABNORMAL LOW (ref 3.5–5.0)
Alkaline Phosphatase: 53 U/L (ref 38–126)
Anion gap: 9 (ref 5–15)
BUN: 33 mg/dL — ABNORMAL HIGH (ref 8–23)
CO2: 24 mmol/L (ref 22–32)
Calcium: 8.7 mg/dL — ABNORMAL LOW (ref 8.9–10.3)
Chloride: 102 mmol/L (ref 98–111)
Creatinine, Ser: 3.84 mg/dL — ABNORMAL HIGH (ref 0.44–1.00)
GFR, Estimated: 12 mL/min — ABNORMAL LOW (ref >60)
Glucose, Bld: 151 mg/dL — ABNORMAL HIGH (ref 70–99)
Potassium: 4.1 mmol/L (ref 3.5–5.1)
Sodium: 135 mmol/L (ref 135–145)
Total Bilirubin: 0.4 mg/dL (ref 0.3–1.2)
Total Protein: 7.1 g/dL (ref 6.5–8.1)

## 2023-06-20 NOTE — ED Notes (Signed)
Discharge instructions reviewed with patient. Patient verbalizes understanding, no further questions at this time. Medications and follow up information provided. No acute distress noted at time of departure.  

## 2023-06-20 NOTE — ED Provider Notes (Signed)
[place] [facility] AT [facility] Provider Note   CSN: [id number] Arrival date & time: 06/20/23  1343     History  Chief Complaint  Patient presents with   Shaking    Veronica Simmons is a 72 y.o. female.  Patient here for intermittent tremors that she has been having for the last 3 to 4 months.  She is talked with her primary care doctor about this but no workups been done.  Sounds like primary care doctor.  Did not have much concerned about it.  She will have these shaking episodes in her upper extremities at times.  Mostly in the morning.  May be after getting up.  She denies any new medicine.  She does not lose consciousness during these events.  She does not have any chest pain or shortness of breath or weakness or numbness or tingling or vision loss during these episodes.  No headaches.  She has a history of diabetes, hypertension, daytime sleepiness.  The history is provided by the patient.       Home Medications Prior to Admission medications   Medication Sig Start Date End Date Taking? Authorizing Provider  albuterol (VENTOLIN HFA) 108 (90 Base) MCG/ACT inhaler Inhale 2 puffs into the lungs every 4 (four) hours as needed for wheezing or shortness of breath. 09/20/22   Dione Booze, MD  amLODipine (NORVASC) 10 MG tablet Take 1 tablet (10 mg total) by mouth daily. 10/18/19   Berton Mount I, MD  atorvastatin (LIPITOR) 10 MG tablet Take 10 mg by mouth daily.    [provider]  baclofen (LIORESAL) 10 MG tablet  06/13/21   [provider]  benzonatate (TESSALON) 100 MG capsule Take 1 capsule (100 mg total) by mouth every 8 (eight) hours. 05/08/21   Melene Plan, DO  calcitRIOL (ROCALTROL) 0.25 MCG capsule Take 0.25 mcg by mouth daily. 08/27/20   [provider]  carvedilol (COREG) 25 MG tablet Take 25 mg by mouth 2 (two) times daily. 07/20/20   [provider]  Coenzyme Q10 (COQ10 PO) Take 1 tablet by mouth daily.     [provider]  ferrous sulfate 325 (65 FE) MG tablet Take 1 tablet by mouth daily.    [provider]  fluticasone Aleda Grana) 50 MCG/ACT nasal spray  06/13/21   [provider]  FOLIC ACID PO Take by mouth.    [provider]  furosemide (LASIX) 80 MG tablet Take 1 tablet (80 mg total) by mouth daily. 08/14/20 09/06/21  Ghimire, Werner Lean, MD  glucose blood (ACCU-CHEK AVIVA PLUS) test strip Use as instructed Patient taking differently: 1 each by Other route as directed. Use as instructed 01/06/16   Haney, Arlyss Repress A, MD  glucose blood (ACCU-CHEK SMARTVIEW) test strip Test blood sugar once daily Patient taking differently: 1 each by Other route See admin instructions. Test blood sugar once daily 10/12/16   Haney, Alyssa A, MD  glucose blood test strip Use as instructed Patient taking differently: 1 each by Other route as directed. Use as instructed 12/29/15   Velora Heckler A, MD  hydrALAZINE (APRESOLINE) 25 MG tablet Take 1 tablet (25 mg total) by mouth every 8 (eight) hours. 08/09/20 09/06/21  Dorcas Carrow, MD  HYDROcodone-acetaminophen (NORCO/VICODIN) 5-325 MG tablet Take 1-2 tablets by mouth every 6 (six) hours as needed. 09/20/22   Dione Booze, MD  insulin glargine, 2 Unit Dial, (TOUJEO MAX SOLOSTAR) 300 UNIT/ML Solostar Pen Inject into the skin. 06/02/21   [provider]  insulin lispro (HUMALOG) 100 UNIT/ML KwikPen Inject 0.01-0.09 mLs (1-9 Units total) into the skin 3 (three) times daily before meals. Blood sugars 70-120: no Insulin 121-150: 2 units 151-200: 3 units 201-250: 4 units 251-300: 5 units 301-350: 6 units 351-400: 9 units More than 400 use 9 units and call your doctor 08/09/20   Dorcas Carrow, MD  Iron-Vitamin C 100-250 MG TABS Take by mouth.    [provider]  Lancets [facility] ULTRASOFT) lancets Use as instructed Patient taking differently: 1 each by Other route as directed. Use as instructed 08/27/15   Velora Heckler A, MD  losartan  (COZAAR) 25 MG tablet Take 25 mg by mouth daily. 10/05/20   [provider]  predniSONE (DELTASONE) 50 MG tablet Take 1 tablet (50 mg total) by mouth daily. 09/20/22   Dione Booze, MD  PRESCRIPTION MEDICATION IRON INFUSIONS as needed. [facility]).    [provider]  rOPINIRole (REQUIP) 0.5 MG tablet TAKE 1 TABLET(0.5 MG) BY MOUTH AT BEDTIME 09/06/21   [provider]  traMADol (ULTRAM) 50 MG tablet Take 1 tablet (50 mg total) by mouth every 6 (six) hours as needed. 01/31/22   Marita Kansas, PA-C  traZODone (DESYREL) 100 MG tablet Take 200 mg by mouth at bedtime as needed for sleep.  03/18/20   [provider]      Allergies    Levofloxacin    Review of Systems   Review of Systems  Physical Exam Updated Vital Signs  ED Triage Vitals  Enc Vitals Group     BP 06/20/23 1350 (!) 164/95     Pulse Rate 06/20/23 1350 94     Resp 06/20/23 1350 20     Temp 06/20/23 1350 97.7 F (36.5 C)     Temp src --      SpO2 06/20/23 1350 100 %     Weight 06/20/23 1350 222 lb (100.7 kg)     Height 06/20/23 1350 5\' 5"  (1.651 m)     Head Circumference --      Peak Flow --      Pain Score 06/20/23 1350 0     Pain Loc --      Pain Edu? --      Excl. in GC? --      Physical Exam Vitals and nursing note reviewed.  Constitutional:      General: She is not in acute distress.    Appearance: She is well-developed. She is not ill-appearing.  HENT:     Head: Normocephalic and atraumatic.     Nose: Nose normal.     Mouth/Throat:     Mouth: Mucous membranes are moist.  Eyes:     Extraocular Movements: Extraocular movements intact.     Conjunctiva/sclera: Conjunctivae normal.     Pupils: Pupils are equal, round, and reactive to light.  Cardiovascular:     Rate and Rhythm: Normal rate and regular rhythm.     Pulses: Normal pulses.     Heart sounds: Normal heart sounds. No murmur heard. Pulmonary:     Effort: Pulmonary effort is normal. No respiratory distress.     Breath  sounds: Normal breath sounds.  Abdominal:     Palpations: Abdomen is soft.     Tenderness: There is no abdominal tenderness.  Musculoskeletal:        General: No swelling.     Cervical back: Normal range of motion and neck supple.  Skin:    General: Skin is warm  and dry.     Capillary Refill: Capillary refill takes less than 2 seconds.  Neurological:     General: No focal deficit present.     Mental Status: She is alert and oriented to person, place, and time.     Cranial Nerves: No cranial nerve deficit.     Sensory: No sensory deficit.     Motor: No weakness.     Coordination: Coordination normal.     Comments: 5+ out of 5 strength throughout, normal sensation, no drift, normal finger-nose-finger, normal speech  Psychiatric:        Mood and Affect: Mood normal.     ED Results / Procedures / Treatments   Labs (all labs ordered are listed, but only abnormal results are displayed) Labs Reviewed  CBC WITH DIFFERENTIAL/PLATELET - Abnormal; Notable for the following components:      Result Value   RBC 3.73 (*)    Hemoglobin 10.9 (*)    HCT 34.1 (*)    All other components within normal limits  COMPREHENSIVE METABOLIC PANEL - Abnormal; Notable for the following components:   Glucose, Bld 151 (*)    BUN 33 (*)    Creatinine, Ser 3.84 (*)    Calcium 8.7 (*)    Albumin 3.1 (*)    GFR, Estimated 12 (*)    All other components within normal limits    EKG EKG Interpretation Date/Time:  Wednesday June 20 2023 14:25:54 EDT Ventricular Rate:  84 PR Interval:  157 QRS Duration:  95 QT Interval:  397 QTC Calculation: 470 R Axis:   44  Text Interpretation: Sinus rhythm Multiple premature complexes, vent & supraven Probable left atrial enlargement Low voltage, precordial leads Confirmed by Virgina Norfolk [phone]) on 06/20/2023 2:29:43 PM  Radiology CT Head Wo Contrast  Result Date: 06/20/2023 CLINICAL DATA:  Headache, increasing frequency or severity EXAM: CT HEAD WITHOUT CONTRAST  TECHNIQUE: Contiguous axial images were obtained from the base of the skull through the vertex without intravenous contrast. RADIATION DOSE REDUCTION: This exam was performed according to the departmental dose-optimization program which includes automated exposure control, adjustment of the mA and/or kV according to patient size and/or use of iterative reconstruction technique. COMPARISON:  CT head 10/23/21 FINDINGS: Brain: No evidence of acute infarction, hemorrhage, hydrocephalus, extra-axial collection or mass lesion/mass effect. Chronic right cerebellar infarct. Sequela of moderate chronic microvascular ischemic change. Vascular: No hyperdense vessel or unexpected calcification. Skull: Normal. Negative for fracture or focal lesion. Sinuses/Orbits: Small left mastoid effusion. No middle ear effusion. Paranasal sinuses are clear. Orbits are unremarkable. Other: None. IMPRESSION: 1. No acute intracranial abnormality. No specific CT etiology for headaches identified. 2. Chronic right cerebellar infarct. 3. Small left mastoid effusion. Electronically Signed   By: Lorenza Cambridge M.D.   On: 06/20/2023 14:45    Procedures Procedures    Medications Ordered in ED Medications - No data to display  ED Course/ Medical Decision Making/ A&P                             Medical Decision Making Amount and/or Complexity of Data Reviewed Labs: ordered. Radiology: ordered.   Erline Levine is here for tremors.  History of diabetes, hypertension.  Normal vitals except for mild hyper tension.  No fever.  She has been dealing with tremors for the last several months.  Does not Solik there is very much of an outpatient workup for this despite talking with her primary  care doctor.  She appears neurologically intact.  She states that she will have some tremors mostly in her upper extremities randomly at times.  Happens more in the morning.  She denies any loss of consciousness with these episodes.  No postictal  phase.  She is not having any chest pain or shortness of breath or lightheadedness episodes during these episodes.  No history of seizures.  She does not admit to any stress.  She had a brief episode of tremors in the room when I was evaluating her where she had some shaking to the upper extremities.  She is awake and alert.  Vital signs are normal.  Did not really seem consistent with seizures.  Overall does not really seem consistent with Parkinson's.  She has not had any gait issues.  Differential could be stress related process could be some neuromuscular process. Could be related to her CKD.  This is seems to be chronic given that has been going on for several months but will check to see if there is any acute electrolyte issues.  Will get a head CT and EKG.  If workup is unremarkable anticipate discharge with primary care follow-up and will refer to neurology.  EKG shows no ischemic changes.  Overall unremarkable EKG.  I reviewed interpreted EKG.  Head CT per radiology report with no acute findings.  Lab work per my review and interpretation with no significant anemia or electrolyte abnormality. Cr 3.8, baseline has been around 4 range per patient at last PCP followupw. Overall nonspecific tremors.  Could be stress related.  Will have her follow-up with neurology to further workup neuromuscular causes.  This seems atypical for seizures.  She has not taken any gabapentin or Lyrica.  Do not see any obvious medications that would occur with this her CKD. Follow up with PCP to evaluate for other considerations.  They understand return precautions.  Discharged in good condition.  This chart was dictated using voice recognition software.  Despite best efforts to proofread,  errors can occur which can change the documentation meaning.         Final Clinical Impression(s) / ED Diagnoses Final diagnoses:  Occasional tremors    Rx / DC Orders ED Discharge Orders          Ordered    Ambulatory  referral to Neurology       Comments: An appointment is requested in approximately: 1 week   06/20/23 1436              Virgina Norfolk, DO 06/20/23 1502

## 2023-06-20 NOTE — ED Triage Notes (Signed)
Patient presents to ED via POV from home. Here with 2-3 months of intermittent "shaking". Reports she came to ED today because it has become more frequent. Ambulatory.

## 2023-06-20 NOTE — Discharge Instructions (Addendum)
Continue outpatient workup with your primary care doctor.  Workup today was unremarkable.  I have referred you to neurology for further workup as well as discussed.  If you have any persistent tremors that are lasting for a long time (greater than 20-30 min) that are not resolving on their own or other concerning symptoms please return.  Try to stay well-hydrated and get plenty of sleep and rest.

## 2023-06-27 ENCOUNTER — Encounter (HOSPITAL_COMMUNITY)
Admission: RE | Admit: 2023-06-27 | Discharge: 2023-06-27 | Disposition: A | Discharge status: Home / Self Care | Payer: 59 | Source: Ambulatory Visit | Attending: Nephrology | Admitting: Nephrology

## 2023-06-27 VITALS — BP 161/75 | HR 82 | Temp 97.3°F | Resp 18

## 2023-06-27 DIAGNOSIS — N183 Chronic kidney disease, stage 3 unspecified: Secondary | ICD-10-CM | POA: Insufficient documentation

## 2023-06-27 DIAGNOSIS — D631 Anemia in chronic kidney disease: Secondary | ICD-10-CM | POA: Insufficient documentation

## 2023-06-27 LAB — IRON AND TIBC
Iron: 74 ug/dL (ref 28–170)
Saturation Ratios: 27 % (ref 10.4–31.8)
TIBC: 274 ug/dL (ref 250–450)
UIBC: 200 ug/dL

## 2023-06-27 LAB — POCT HEMOGLOBIN-HEMACUE: Hemoglobin: 10.9 g/dL — ABNORMAL LOW (ref 12.0–15.0)

## 2023-06-27 LAB — FERRITIN: Ferritin: 748 ng/mL — ABNORMAL HIGH (ref 11–307)

## 2023-06-27 MED ORDER — EPOETIN ALFA-EPBX 10000 UNIT/ML IJ SOLN
INTRAMUSCULAR | Status: AC
  Administered 2023-06-27: 15000 [IU] via SUBCUTANEOUS
  Filled 2023-06-27: qty 2

## 2023-06-27 MED ORDER — EPOETIN ALFA-EPBX 10000 UNIT/ML IJ SOLN: 15000.0000 [IU] | INTRAMUSCULAR | Status: DC

## 2023-06-29 ENCOUNTER — Other Ambulatory Visit: Payer: Self-pay

## 2023-06-29 ENCOUNTER — Emergency Department (HOSPITAL_BASED_OUTPATIENT_CLINIC_OR_DEPARTMENT_OTHER): Payer: 59

## 2023-06-29 ENCOUNTER — Emergency Department (HOSPITAL_BASED_OUTPATIENT_CLINIC_OR_DEPARTMENT_OTHER)
Admission: EM | Admit: 2023-06-29 | Discharge: 2023-06-29 | Disposition: A | Discharge status: Home / Self Care | Payer: 59 | Attending: Emergency Medicine | Admitting: Emergency Medicine

## 2023-06-29 ENCOUNTER — Encounter (HOSPITAL_BASED_OUTPATIENT_CLINIC_OR_DEPARTMENT_OTHER): Payer: Self-pay | Admitting: Emergency Medicine

## 2023-06-29 DIAGNOSIS — Z79899 Other long term (current) drug therapy: Secondary | ICD-10-CM | POA: Insufficient documentation

## 2023-06-29 DIAGNOSIS — E1122 Type 2 diabetes mellitus with diabetic chronic kidney disease: Secondary | ICD-10-CM | POA: Insufficient documentation

## 2023-06-29 DIAGNOSIS — Z794 Long term (current) use of insulin: Secondary | ICD-10-CM | POA: Diagnosis not present

## 2023-06-29 DIAGNOSIS — N189 Chronic kidney disease, unspecified: Secondary | ICD-10-CM | POA: Insufficient documentation

## 2023-06-29 DIAGNOSIS — R0602 Shortness of breath: Secondary | ICD-10-CM | POA: Diagnosis present

## 2023-06-29 DIAGNOSIS — I130 Hypertensive heart and chronic kidney disease with heart failure and stage 1 through stage 4 chronic kidney disease, or unspecified chronic kidney disease: Secondary | ICD-10-CM | POA: Diagnosis not present

## 2023-06-29 DIAGNOSIS — I509 Heart failure, unspecified: Secondary | ICD-10-CM | POA: Diagnosis not present

## 2023-06-29 DIAGNOSIS — I10 Essential (primary) hypertension: Secondary | ICD-10-CM

## 2023-06-29 LAB — CBC WITH DIFFERENTIAL/PLATELET
Abs Immature Granulocytes: 0.02 10*3/uL (ref 0.00–0.07)
Basophils Absolute: 0 10*3/uL (ref 0.0–0.1)
Basophils Relative: 0 %
Eosinophils Absolute: 0.3 10*3/uL (ref 0.0–0.5)
Eosinophils Relative: 4 %
HCT: 36.9 % (ref 36.0–46.0)
Hemoglobin: 11.6 g/dL — ABNORMAL LOW (ref 12.0–15.0)
Immature Granulocytes: 0 %
Lymphocytes Relative: 33 %
Lymphs Abs: 2.5 10*3/uL (ref 0.7–4.0)
MCH: 28.4 pg (ref 26.0–34.0)
MCHC: 31.4 g/dL (ref 30.0–36.0)
MCV: 90.4 fL (ref 80.0–100.0)
Monocytes Absolute: 0.4 10*3/uL (ref 0.1–1.0)
Monocytes Relative: 6 %
Neutro Abs: 4.3 10*3/uL (ref 1.7–7.7)
Neutrophils Relative %: 57 %
Platelets: 370 10*3/uL (ref 150–400)
RBC: 4.08 MIL/uL (ref 3.87–5.11)
RDW: 14.3 % (ref 11.5–15.5)
WBC: 7.6 10*3/uL (ref 4.0–10.5)
nRBC: 0 % (ref 0.0–0.2)

## 2023-06-29 LAB — COMPREHENSIVE METABOLIC PANEL
ALT: 12 U/L (ref 0–44)
AST: 19 U/L (ref 15–41)
Albumin: 3.4 g/dL — ABNORMAL LOW (ref 3.5–5.0)
Alkaline Phosphatase: 54 U/L (ref 38–126)
Anion gap: 10 (ref 5–15)
BUN: 32 mg/dL — ABNORMAL HIGH (ref 8–23)
CO2: 23 mmol/L (ref 22–32)
Calcium: 8.9 mg/dL (ref 8.9–10.3)
Chloride: 102 mmol/L (ref 98–111)
Creatinine, Ser: 3.78 mg/dL — ABNORMAL HIGH (ref 0.44–1.00)
GFR, Estimated: 12 mL/min — ABNORMAL LOW (ref >60)
Glucose, Bld: 154 mg/dL — ABNORMAL HIGH (ref 70–99)
Potassium: 3.8 mmol/L (ref 3.5–5.1)
Sodium: 135 mmol/L (ref 135–145)
Total Bilirubin: 0.3 mg/dL (ref 0.3–1.2)
Total Protein: 7.7 g/dL (ref 6.5–8.1)

## 2023-06-29 NOTE — Discharge Instructions (Addendum)
Continue taking your propranolol and nifedipine as prescribed  Continue monitoring your blood pressures at home daily and keep a log.  Measure your blood pressure at the same time every day.  Keep your feet flat on the floor for at least 15 minutes before taking your blood pressure.   If your blood pressures are consistently above the 160s over 90s, please follow-up with your primary care provider within the next 2 weeks to address further blood pressure management.  Return to the ER if you develop worsening shortness of breath, abdominal pain, vomiting, headache, changes in vision, feel confused, any other new or concerning symptoms.

## 2023-06-29 NOTE — ED Provider Notes (Signed)
[place] [facility] Provider Note   CSN: [id number] Arrival date & time: 06/29/23  1923     History  Chief Complaint  Patient presents with   Shortness of Breath   Hypertension    Veronica Simmons is a 72 y.o. female with anemia of chronic disease secondary to CKD, hypertension, heart failure, diabetes who presents with concern for high blood pressures at home over the last week.  She normally takes nifedipine and propranolol for her blood pressure management, and blood pressures are usually in the 150s over 80s.  Patient states her blood pressures have occasionally been in the 200s over the last week.  She took 2 doses of her propranolol before coming in today.  She denies any headache, changes in vision, abdominal pain, vomiting, chest pain.  She states she has shortness of breath when walking but this is consistent with her baseline.       Shortness of Breath Hypertension Associated symptoms include shortness of breath.       Home Medications Prior to Admission medications   Medication Sig Start Date End Date Taking? Authorizing Provider  propranolol (INDERAL) 10 MG tablet Take 10 mg by mouth 2 (two) times daily.   Yes [provider]  albuterol (VENTOLIN HFA) 108 (90 Base) MCG/ACT inhaler Inhale 2 puffs into the lungs every 4 (four) hours as needed for wheezing or shortness of breath. 09/20/22   Dione Booze, MD  atorvastatin (LIPITOR) 10 MG tablet Take 10 mg by mouth daily.    [provider]  carvedilol (COREG) 25 MG tablet Take 25 mg by mouth 2 (two) times daily. 07/20/20   [provider]  ferrous sulfate 325 (65 FE) MG tablet Take 1 tablet by mouth daily.    [provider]  FOLIC ACID PO Take by mouth.    [provider]  furosemide (LASIX) 80 MG tablet Take 1 tablet (80 mg total) by mouth daily. 08/14/20 09/06/21  Ghimire, Werner Lean, MD  glucose blood (ACCU-CHEK AVIVA PLUS) test  strip Use as instructed Patient taking differently: 1 each by Other route as directed. Use as instructed 01/06/16   Haney, Arlyss Repress A, MD  glucose blood (ACCU-CHEK SMARTVIEW) test strip Test blood sugar once daily Patient taking differently: 1 each by Other route See admin instructions. Test blood sugar once daily 10/12/16   Haney, Alyssa A, MD  glucose blood test strip Use as instructed Patient taking differently: 1 each by Other route as directed. Use as instructed 12/29/15   Haney, Arlyss Repress A, MD  insulin glargine, 2 Unit Dial, (TOUJEO MAX SOLOSTAR) 300 UNIT/ML Solostar Pen Inject into the skin. 06/02/21   [provider]  insulin lispro (HUMALOG) 100 UNIT/ML KwikPen Inject 0.01-0.09 mLs (1-9 Units total) into the skin 3 (three) times daily before meals. Blood sugars 70-120: no Insulin 121-150: 2 units 151-200: 3 units 201-250: 4 units 251-300: 5 units 301-350: 6 units 351-400: 9 units More than 400 use 9 units and call your doctor 08/09/20   Dorcas Carrow, MD  Iron-Vitamin C 100-250 MG TABS Take by mouth.    [provider]  Lancets [facility] ULTRASOFT) lancets Use as instructed Patient taking differently: 1 each by Other route as directed. Use as instructed 08/27/15   Velora Heckler A, MD  losartan (COZAAR) 25 MG tablet Take 25 mg by mouth daily. 10/05/20   [provider]  predniSONE (DELTASONE) 50 MG tablet Take 1 tablet (50 mg total) by mouth daily.  09/20/22   Dione Booze, MD  PRESCRIPTION MEDICATION IRON INFUSIONS as needed. [facility]).    [provider]  rOPINIRole (REQUIP) 0.5 MG tablet TAKE 1 TABLET(0.5 MG) BY MOUTH AT BEDTIME 09/06/21   [provider]  traZODone (DESYREL) 100 MG tablet Take 200 mg by mouth at bedtime as needed for sleep.  03/18/20   [provider]      Allergies    Levofloxacin    Review of Systems   Review of Systems  HENT:         No headache or change in vision  Respiratory:  Positive for shortness of breath.      Physical Exam Updated Vital Signs BP (!) 156/89   Pulse 92   Temp 97.9 F (36.6 C) (Oral)   Resp 20   SpO2 98%  Physical Exam Vitals and nursing note reviewed.  Constitutional:      General: She is not in acute distress.    Appearance: Normal appearance. She is normal weight.     Comments: Talking in full sentences  HENT:     Head: Atraumatic.  Eyes:     Extraocular Movements: Extraocular movements intact.     Pupils: Pupils are equal, round, and reactive to light.  Cardiovascular:     Rate and Rhythm: Normal rate and regular rhythm.     Pulses: Normal pulses.  Pulmonary:     Effort: Pulmonary effort is normal. No respiratory distress.     Breath sounds: Normal breath sounds.  Abdominal:     General: Abdomen is flat. Bowel sounds are normal. There is no distension.     Palpations: Abdomen is soft.     Tenderness: There is no abdominal tenderness. There is no guarding.  Musculoskeletal:        General: Normal range of motion.     Cervical back: Normal range of motion. No rigidity.     Right lower leg: No edema.     Left lower leg: No edema.  Skin:    General: Skin is warm and dry.     Capillary Refill: Capillary refill takes less than 2 seconds.  Neurological:     General: No focal deficit present.     Mental Status: She is alert.     Cranial Nerves: No cranial nerve deficit.     Motor: No weakness.     Gait: Gait normal.  Psychiatric:        Mood and Affect: Mood normal.        Behavior: Behavior normal.     ED Results / Procedures / Treatments   Labs (all labs ordered are listed, but only abnormal results are displayed) Labs Reviewed  CBC WITH DIFFERENTIAL/PLATELET - Abnormal; Notable for the following components:      Result Value   Hemoglobin 11.6 (*)    All other components within normal limits  COMPREHENSIVE METABOLIC PANEL - Abnormal; Notable for the following components:   Glucose, Bld 154 (*)    BUN 32 (*)    Creatinine, Ser 3.78 (*)     Albumin 3.4 (*)    GFR, Estimated 12 (*)    All other components within normal limits    EKG None  Radiology DG Chest 2 View  Result Date: 06/29/2023 CLINICAL DATA:  Shortness of breath EXAM: CHEST - 2 VIEW COMPARISON:  09/20/2022 FINDINGS: Transverse diameter of heart is increased. Right hilum is more prominent than left. This finding has not changed. There is mild elevation of  minor fissure in the right parahilar region. Small linear densities seen in right upper lung field immediately above the minor fissure. Rest of the lung fields are unremarkable. There are no signs of alveolar pulmonary edema. There is no pleural effusion or pneumothorax. IMPRESSION: Cardiomegaly. Right hilum is more prominent in the left, possibly due to prominent central pulmonary artery. This finding has not changed significantly. Linear densities are seen in right upper lobe immediately above the minor fissure suggesting possible scarring or subsegmental atelectasis. Electronically Signed   By: Ernie Avena M.D.   On: 06/29/2023 20:48    Procedures Procedures    Medications Ordered in ED Medications - No data to display  ED Course/ Medical Decision Making/ A&P                             Medical Decision Making Amount and/or Complexity of Data Reviewed Labs: ordered. Radiology: ordered.   72 y.o. female with pertinent past medical history of anemia of chronic disease secondary to CKD, hypertension, heart failure, diabetes presents to the ED for concern of high blood pressures at home  Differential diagnosis includes but is not limited to essential hypertension, hypertensive urgency, hypertensive emergency  ED Course:  Patient presents with concern of elevated blood pressure readings at home over the last week.  She states that she has had a couple readings of systolics over 200.  She was told by a nurse to take 2 doses of her propranolol and if it was still elevated to come to the ER.  Upon  arrival to the ER her blood pressure is 185/69, now 156/89 which is consistent with baseline.  Denies any signs of end-organ damage such as changes in vision, headache, abdominal pain, altered mental status, vomiting, chest pain.  CMP shows elevation in BUN and creatinine, although consistent with baseline.  No increase in LFTs.  She is alert and oriented. No shortness of breath in the ER, no lower extremity edema.  States her shortness of breath when walking is consistent with her baseline.  No new cough.  Chest x-ray imaging not indicated at this time   Impression: Hypertension  Disposition:  The patient was discharged home with instructions to continue monitoring her blood pressures at home after sitting with her feet flat on the floor for at least 15 minutes prior to taking.  Follow-up with her primary care provider for further blood pressure management. Return precautions given.  Lab Tests: I Ordered, and personally interpreted labs.  The pertinent results include:   CBC with no concerning findings CMP with elevated BUN and creatinine that is consistent with patient baseline.  No elevation in LFTs.  Imaging Studies ordered: Not indicated   Cardiac Monitoring: / EKG: The patient was maintained on a cardiac monitor.  I personally viewed and interpreted the EKG  which showed an underlying rhythm of: Normal rhythm, premature atrial complexes   Consultations Obtained: Not indicated  Co morbidities that complicate the patient evaluation  anemia of chronic disease secondary to CKD, hypertension, heart failure, diabetes  Social Determinants of Health:  unknown              Final Clinical Impression(s) / ED Diagnoses Final diagnoses:  None    Rx / DC Orders ED Discharge Orders     None         Arabella Merles, PA-C 06/29/23 2248    Elayne Snare K, DO 06/29/23 2342

## 2023-06-29 NOTE — ED Triage Notes (Signed)
Patient with shortness of breath, she states that she is tired all the time.  She feels like she is out of breath when she does anything.  She states that she has anemia.  She also has been having some high BPs at home and concerned about them.

## 2023-07-11 ENCOUNTER — Encounter (HOSPITAL_COMMUNITY)
Admission: RE | Admit: 2023-07-11 | Discharge: 2023-07-11 | Disposition: A | Discharge status: Home / Self Care | Payer: 59 | Source: Ambulatory Visit | Attending: Nephrology | Admitting: Nephrology

## 2023-07-11 VITALS — BP 157/83 | HR 92 | Temp 97.4°F | Resp 18

## 2023-07-11 DIAGNOSIS — N183 Chronic kidney disease, stage 3 unspecified: Secondary | ICD-10-CM | POA: Diagnosis not present

## 2023-07-11 DIAGNOSIS — D631 Anemia in chronic kidney disease: Secondary | ICD-10-CM

## 2023-07-11 LAB — POCT HEMOGLOBIN-HEMACUE: Hemoglobin: 11.2 g/dL — ABNORMAL LOW (ref 12.0–15.0)

## 2023-07-11 MED ORDER — EPOETIN ALFA-EPBX 10000 UNIT/ML IJ SOLN
15000.0000 [IU] | INTRAMUSCULAR | Status: DC
  Administered 2023-07-11: 15000 [IU] via SUBCUTANEOUS

## 2023-07-11 MED ORDER — EPOETIN ALFA-EPBX 10000 UNIT/ML IJ SOLN
INTRAMUSCULAR | Status: AC
  Filled 2023-07-11: qty 2

## 2023-07-25 ENCOUNTER — Encounter (HOSPITAL_COMMUNITY)
Admission: RE | Admit: 2023-07-25 | Discharge: 2023-07-25 | Disposition: A | Discharge status: Home / Self Care | Payer: 59 | Source: Ambulatory Visit | Attending: Nephrology | Admitting: Nephrology

## 2023-07-25 VITALS — BP 161/81 | HR 86 | Temp 97.2°F | Resp 17

## 2023-07-25 DIAGNOSIS — N183 Chronic kidney disease, stage 3 unspecified: Secondary | ICD-10-CM | POA: Insufficient documentation

## 2023-07-25 DIAGNOSIS — D631 Anemia in chronic kidney disease: Secondary | ICD-10-CM | POA: Insufficient documentation

## 2023-07-25 LAB — IRON AND TIBC
Iron: 100 ug/dL (ref 28–170)
Saturation Ratios: 34 % — ABNORMAL HIGH (ref 10.4–31.8)
TIBC: 293 ug/dL (ref 250–450)
UIBC: 193 ug/dL

## 2023-07-25 LAB — POCT HEMOGLOBIN-HEMACUE: Hemoglobin: 10.9 g/dL — ABNORMAL LOW (ref 12.0–15.0)

## 2023-07-25 LAB — FERRITIN: Ferritin: 762 ng/mL — ABNORMAL HIGH (ref 11–307)

## 2023-07-25 MED ORDER — EPOETIN ALFA-EPBX 10000 UNIT/ML IJ SOLN
INTRAMUSCULAR | Status: AC
  Administered 2023-07-25: 15000 [IU] via SUBCUTANEOUS
  Filled 2023-07-25: qty 2

## 2023-07-25 MED ORDER — EPOETIN ALFA-EPBX 10000 UNIT/ML IJ SOLN: 15000.0000 [IU] | INTRAMUSCULAR | Status: DC

## 2023-08-08 ENCOUNTER — Encounter (HOSPITAL_COMMUNITY): Payer: 59

## 2023-08-22 ENCOUNTER — Encounter (HOSPITAL_COMMUNITY): Payer: 59

## 2023-08-29 ENCOUNTER — Ambulatory Visit (HOSPITAL_COMMUNITY)
Admission: RE | Admit: 2023-08-29 | Discharge: 2023-08-29 | Disposition: A | Discharge status: Home / Self Care | Payer: 59 | Source: Ambulatory Visit | Attending: Nephrology | Admitting: Nephrology

## 2023-08-29 VITALS — BP 166/73 | HR 80 | Temp 97.3°F | Resp 18

## 2023-08-29 DIAGNOSIS — N183 Chronic kidney disease, stage 3 unspecified: Secondary | ICD-10-CM | POA: Insufficient documentation

## 2023-08-29 DIAGNOSIS — D631 Anemia in chronic kidney disease: Secondary | ICD-10-CM | POA: Insufficient documentation

## 2023-08-29 LAB — FERRITIN: Ferritin: 884 ng/mL — ABNORMAL HIGH (ref 11–307)

## 2023-08-29 LAB — IRON AND TIBC
Iron: 80 ug/dL (ref 28–170)
Saturation Ratios: 26 % (ref 10.4–31.8)
TIBC: 304 ug/dL (ref 250–450)
UIBC: 224 ug/dL

## 2023-08-29 LAB — POCT HEMOGLOBIN-HEMACUE: Hemoglobin: 9.2 g/dL — ABNORMAL LOW (ref 12.0–15.0)

## 2023-08-29 MED ORDER — EPOETIN ALFA-EPBX 10000 UNIT/ML IJ SOLN
INTRAMUSCULAR | Status: AC
  Administered 2023-08-29: 15000 [IU] via SUBCUTANEOUS
  Filled 2023-08-29: qty 2

## 2023-08-29 MED ORDER — EPOETIN ALFA-EPBX 10000 UNIT/ML IJ SOLN: 15000.0000 [IU] | INTRAMUSCULAR | Status: DC

## 2023-08-31 ENCOUNTER — Encounter (HOSPITAL_BASED_OUTPATIENT_CLINIC_OR_DEPARTMENT_OTHER): Payer: Self-pay

## 2023-08-31 ENCOUNTER — Other Ambulatory Visit: Payer: Self-pay

## 2023-08-31 ENCOUNTER — Emergency Department (HOSPITAL_BASED_OUTPATIENT_CLINIC_OR_DEPARTMENT_OTHER): Payer: 59

## 2023-08-31 ENCOUNTER — Emergency Department (HOSPITAL_BASED_OUTPATIENT_CLINIC_OR_DEPARTMENT_OTHER)
Admission: EM | Admit: 2023-08-31 | Discharge: 2023-08-31 | Discharge status: Against Medical Advice | Payer: 59 | Attending: Emergency Medicine | Admitting: Emergency Medicine

## 2023-08-31 DIAGNOSIS — Z5321 Procedure and treatment not carried out due to patient leaving prior to being seen by health care provider: Secondary | ICD-10-CM | POA: Diagnosis not present

## 2023-08-31 DIAGNOSIS — I10 Essential (primary) hypertension: Secondary | ICD-10-CM | POA: Insufficient documentation

## 2023-08-31 NOTE — ED Notes (Addendum)
Pt was accompanied by daughter  Initial BP 170/73. Pt denying HA, CP blurred vision, weakness. No neuro deficit. Sent by PCP because of systolic readings over 190 and needing treatment for BP. After sitting in triage BP down to 142/66. Pt and daughter decided to go home since BP down and will follow up with provider for further management. Pt reports vomiting only because of eating en route

## 2023-08-31 NOTE — ED Triage Notes (Addendum)
Pt sent over by PCP due to elevated BP. Reports sys over 190. Blood pressure elevated x 1 month. Pt vomited x1 in lobby. No prior episodes of vomiting. Denies chest pain. Feels anxious

## 2023-09-12 ENCOUNTER — Encounter (HOSPITAL_COMMUNITY): Payer: 59

## 2023-09-14 ENCOUNTER — Encounter (HOSPITAL_COMMUNITY)
Admission: RE | Admit: 2023-09-14 | Discharge: 2023-09-14 | Disposition: A | Discharge status: Home / Self Care | Payer: 59 | Source: Ambulatory Visit | Attending: Nephrology | Admitting: Nephrology

## 2023-09-14 ENCOUNTER — Inpatient Hospital Stay (HOSPITAL_COMMUNITY): Admission: RE | Admit: 2023-09-14 | Payer: 59 | Source: Ambulatory Visit

## 2023-09-14 VITALS — BP 147/57 | HR 88 | Temp 97.3°F | Resp 18

## 2023-09-14 DIAGNOSIS — N183 Chronic kidney disease, stage 3 unspecified: Secondary | ICD-10-CM | POA: Insufficient documentation

## 2023-09-14 DIAGNOSIS — D631 Anemia in chronic kidney disease: Secondary | ICD-10-CM | POA: Insufficient documentation

## 2023-09-14 LAB — POCT HEMOGLOBIN-HEMACUE: Hemoglobin: 9 g/dL — ABNORMAL LOW (ref 12.0–15.0)

## 2023-09-14 MED ORDER — EPOETIN ALFA-EPBX 10000 UNIT/ML IJ SOLN
INTRAMUSCULAR | Status: AC
  Administered 2023-09-14: 15000 [IU] via SUBCUTANEOUS
  Filled 2023-09-14: qty 2

## 2023-09-14 MED ORDER — EPOETIN ALFA-EPBX 10000 UNIT/ML IJ SOLN: 15000.0000 [IU] | INTRAMUSCULAR | Status: DC

## 2023-09-26 ENCOUNTER — Encounter (HOSPITAL_COMMUNITY): Payer: 59

## 2023-09-27 ENCOUNTER — Other Ambulatory Visit: Payer: Self-pay

## 2023-09-27 ENCOUNTER — Encounter (HOSPITAL_BASED_OUTPATIENT_CLINIC_OR_DEPARTMENT_OTHER): Payer: Self-pay | Admitting: Emergency Medicine

## 2023-09-27 ENCOUNTER — Emergency Department (HOSPITAL_BASED_OUTPATIENT_CLINIC_OR_DEPARTMENT_OTHER)
Admission: EM | Admit: 2023-09-27 | Discharge: 2023-09-27 | Disposition: A | Discharge status: Home / Self Care | Payer: 59 | Attending: Emergency Medicine | Admitting: Emergency Medicine

## 2023-09-27 DIAGNOSIS — M62838 Other muscle spasm: Secondary | ICD-10-CM | POA: Diagnosis not present

## 2023-09-27 DIAGNOSIS — M542 Cervicalgia: Secondary | ICD-10-CM | POA: Diagnosis present

## 2023-09-27 DIAGNOSIS — I130 Hypertensive heart and chronic kidney disease with heart failure and stage 1 through stage 4 chronic kidney disease, or unspecified chronic kidney disease: Secondary | ICD-10-CM | POA: Diagnosis not present

## 2023-09-27 DIAGNOSIS — I509 Heart failure, unspecified: Secondary | ICD-10-CM | POA: Insufficient documentation

## 2023-09-27 DIAGNOSIS — N189 Chronic kidney disease, unspecified: Secondary | ICD-10-CM | POA: Insufficient documentation

## 2023-09-27 DIAGNOSIS — Z794 Long term (current) use of insulin: Secondary | ICD-10-CM | POA: Insufficient documentation

## 2023-09-27 DIAGNOSIS — Z79899 Other long term (current) drug therapy: Secondary | ICD-10-CM | POA: Insufficient documentation

## 2023-09-27 DIAGNOSIS — E1122 Type 2 diabetes mellitus with diabetic chronic kidney disease: Secondary | ICD-10-CM | POA: Diagnosis not present

## 2023-09-27 DIAGNOSIS — M791 Myalgia, unspecified site: Secondary | ICD-10-CM

## 2023-09-27 LAB — CBC WITH DIFFERENTIAL/PLATELET
Abs Immature Granulocytes: 0.01 10*3/uL (ref 0.00–0.07)
Basophils Absolute: 0 10*3/uL (ref 0.0–0.1)
Basophils Relative: 0 %
Eosinophils Absolute: 0.2 10*3/uL (ref 0.0–0.5)
Eosinophils Relative: 3 %
HCT: 28.7 % — ABNORMAL LOW (ref 36.0–46.0)
Hemoglobin: 9.1 g/dL — ABNORMAL LOW (ref 12.0–15.0)
Immature Granulocytes: 0 %
Lymphocytes Relative: 31 %
Lymphs Abs: 1.6 10*3/uL (ref 0.7–4.0)
MCH: 29 pg (ref 26.0–34.0)
MCHC: 31.7 g/dL (ref 30.0–36.0)
MCV: 91.4 fL (ref 80.0–100.0)
Monocytes Absolute: 0.4 10*3/uL (ref 0.1–1.0)
Monocytes Relative: 7 %
Neutro Abs: 3.2 10*3/uL (ref 1.7–7.7)
Neutrophils Relative %: 59 %
Platelets: 219 10*3/uL (ref 150–400)
RBC: 3.14 MIL/uL — ABNORMAL LOW (ref 3.87–5.11)
RDW: 14.6 % (ref 11.5–15.5)
WBC: 5.3 10*3/uL (ref 4.0–10.5)
nRBC: 0 % (ref 0.0–0.2)

## 2023-09-27 LAB — BASIC METABOLIC PANEL
Anion gap: 10 (ref 5–15)
BUN: 36 mg/dL — ABNORMAL HIGH (ref 8–23)
CO2: 24 mmol/L (ref 22–32)
Calcium: 8.8 mg/dL — ABNORMAL LOW (ref 8.9–10.3)
Chloride: 100 mmol/L (ref 98–111)
Creatinine, Ser: 4.33 mg/dL — ABNORMAL HIGH (ref 0.44–1.00)
GFR, Estimated: 10 mL/min — ABNORMAL LOW (ref >60)
Glucose, Bld: 146 mg/dL — ABNORMAL HIGH (ref 70–99)
Potassium: 3.7 mmol/L (ref 3.5–5.1)
Sodium: 134 mmol/L — ABNORMAL LOW (ref 135–145)

## 2023-09-27 MED ORDER — LIDOCAINE 5 % EX PTCH: 1.0000 | MEDICATED_PATCH | CUTANEOUS | 0 refills | Status: AC

## 2023-09-27 MED ORDER — METHOCARBAMOL 500 MG PO TABS: 500.0000 mg | ORAL_TABLET | Freq: Every day | ORAL | 0 refills | Status: DC

## 2023-09-27 NOTE — Discharge Instructions (Signed)
You are likely having muscle spasms.I have written you for a muscle relaxer and lidocaine patches.  You may place on the area where you are having pain.  You leave these on for 12 hours.  Take off for 12 hours.  Must be patch free for 12 hours prior to placing a new patch.  Do not put more than 2 patches on at the same time.  Make sure to let your transplant team his nephrologist know about your kidney function.  Return for any worsening symptoms.  Do not take medications such as ibuprofen or Aleve as this can worsen your kidney function

## 2023-09-27 NOTE — ED Triage Notes (Signed)
Pt to ER with c/o neck spasms on and off for last two weeks.  Pt denies other c/o at this time.

## 2023-09-27 NOTE — ED Provider Notes (Signed)
[facility] Provider Note   CSN: [id number] Arrival date & time: 09/27/23  1230    History  Chief Complaint  Patient presents with   Neck Pain    Veronica Simmons is a 72 y.o. female history of diabetes, hypertension, hyperlipidemia, CHF, CKD analysis here for evaluation of muscle spasms.  Patient states over the last 3 weeks cramping to her bilateral lower extremities and left trapezius.  Worse with movement.  Relieved when using a hot pack.  Has not been taking any medications at home.  States she intermittently has to take potassium supplementation due to low potassium.  She is unsure the last time this was checked.  Denies any recent falls or injuries.  She denies any headache, vision changes, fever, chest pain, shortness of breath, numbness, weakness, abdominal pain.  She denies any pain or swelling to her extremities.  She has not seen her PCP for this.  She denies any known history of aneurysms.  No neck stiffness, neck rigidity. Sx primarily at night, resolves during the day.  HPI     Home Medications Prior to Admission medications   Medication Sig Start Date End Date Taking? Authorizing Provider  lidocaine (LIDODERM) 5 % Place 1 patch onto the skin daily. Remove & Discard patch within 12 hours or as directed by MD 09/27/23  Yes Corwin Kuiken A, PA-C  methocarbamol (ROBAXIN) 500 MG tablet Take 1 tablet (500 mg total) by mouth daily. 09/27/23  Yes Glendy Barsanti A, PA-C  albuterol (VENTOLIN HFA) 108 (90 Base) MCG/ACT inhaler Inhale 2 puffs into the lungs every 4 (four) hours as needed for wheezing or shortness of breath. 09/20/22   Dione Booze, MD  atorvastatin (LIPITOR) 10 MG tablet Take 10 mg by mouth daily.    [provider]  carvedilol (COREG) 25 MG tablet Take 25 mg by mouth 2 (two) times daily. 07/20/20   [provider]  ferrous sulfate 325 (65 FE) MG tablet Take 1 tablet by mouth daily.     [provider]  FOLIC ACID PO Take by mouth.    [provider]  furosemide (LASIX) 80 MG tablet Take 1 tablet (80 mg total) by mouth daily. 08/14/20 09/06/21  Ghimire, Werner Lean, MD  glucose blood (ACCU-CHEK AVIVA PLUS) test strip Use as instructed Patient taking differently: 1 each by Other route as directed. Use as instructed 01/06/16   Haney, Arlyss Repress A, MD  glucose blood (ACCU-CHEK SMARTVIEW) test strip Test blood sugar once daily Patient taking differently: 1 each by Other route See admin instructions. Test blood sugar once daily 10/12/16   Haney, Alyssa A, MD  glucose blood test strip Use as instructed Patient taking differently: 1 each by Other route as directed. Use as instructed 12/29/15   Haney, Arlyss Repress A, MD  insulin glargine, 2 Unit Dial, (TOUJEO MAX SOLOSTAR) 300 UNIT/ML Solostar Pen Inject into the skin. 06/02/21   [provider]  insulin lispro (HUMALOG) 100 UNIT/ML KwikPen Inject 0.01-0.09 mLs (1-9 Units total) into the skin 3 (three) times daily before meals. Blood sugars 70-120: no Insulin 121-150: 2 units 151-200: 3 units 201-250: 4 units 251-300: 5 units 301-350: 6 units 351-400: 9 units More than 400 use 9 units and call your doctor 08/09/20   Dorcas Carrow, MD  Iron-Vitamin C 100-250 MG TABS Take by mouth.    [provider]  Lancets [facility] ULTRASOFT) lancets Use as instructed Patient taking differently: 1 each by Other route as  directed. Use as instructed 08/27/15   Velora Heckler A, MD  losartan (COZAAR) 25 MG tablet Take 25 mg by mouth daily. 10/05/20   [provider]  predniSONE (DELTASONE) 50 MG tablet Take 1 tablet (50 mg total) by mouth daily. 09/20/22   Dione Booze, MD  PRESCRIPTION MEDICATION IRON INFUSIONS as needed. [facility]).    [provider]  propranolol (INDERAL) 10 MG tablet Take 10 mg by mouth 2 (two) times daily.    [provider]  rOPINIRole (REQUIP) 0.5 MG tablet TAKE 1 TABLET(0.5 MG) BY MOUTH AT  BEDTIME 09/06/21   [provider]  traZODone (DESYREL) 100 MG tablet Take 200 mg by mouth at bedtime as needed for sleep.  03/18/20   [provider]      Allergies    Levofloxacin    Review of Systems   Review of Systems  Constitutional: Negative.   HENT: Negative.    Respiratory: Negative.    Cardiovascular: Negative.   Gastrointestinal: Negative.   Genitourinary: Negative.   Musculoskeletal:  Positive for myalgias and neck pain. Negative for arthralgias, back pain, gait problem, joint swelling and neck stiffness.  Skin: Negative.   Neurological: Negative.   All other systems reviewed and are negative.   Physical Exam Updated Vital Signs BP (!) 158/69   Pulse 78   Temp 98.3 F (36.8 C) (Oral)   Resp 18   Ht 5' 5.5" (1.664 m)   Wt 100.7 kg   SpO2 99%   BMI 36.38 kg/m  Physical Exam Vitals and nursing note reviewed.  Constitutional:      General: She is not in acute distress.    Appearance: She is well-developed. She is not ill-appearing, toxic-appearing or diaphoretic.  HENT:     Head: Normocephalic and atraumatic.     Nose: Nose normal.     Mouth/Throat:     Mouth: Mucous membranes are moist.  Eyes:     Pupils: Pupils are equal, round, and reactive to light.  Neck:     Vascular: Normal carotid pulses. No carotid bruit, hepatojugular reflux or JVD.     Trachea: Trachea and phonation normal.     Meningeal: Brudzinski's sign and Kernig's sign absent.      Comments: No midline cervical tenderness.  Full range of motion without difficulty. Cardiovascular:     Rate and Rhythm: Normal rate.     Pulses: Normal pulses.          Radial pulses are 2+ on the right side and 2+ on the left side.     Heart sounds: Normal heart sounds.  Pulmonary:     Effort: Pulmonary effort is normal. No respiratory distress.     Breath sounds: Normal breath sounds and air entry.     Comments: Clear Bilaterally, speaks in full sentences without difficulty. Chest:      Comments: Nontender chest wall, no crepitus or step-off. Abdominal:     General: Bowel sounds are normal. There is no distension.     Palpations: Abdomen is soft.     Tenderness: There is no abdominal tenderness. There is no guarding or rebound.     Comments: Soft, nontender  Musculoskeletal:        General: Normal range of motion.     Cervical back: Full passive range of motion without pain, normal range of motion and neck supple. Tenderness present. No edema, erythema, signs of trauma, rigidity, torticollis or crepitus. Muscular tenderness present. No pain with movement or spinous  process tenderness. Normal range of motion.     Comments: Compartments soft, no bony tenderness, full range of motion without difficulty  Lymphadenopathy:     Cervical: No cervical adenopathy.  Skin:    General: Skin is warm and dry.     Capillary Refill: Capillary refill takes less than 2 seconds.     Comments: No edema, erythema or warmth.  No fluctuance or induration.  Neurological:     General: No focal deficit present.     Mental Status: She is alert.     Cranial Nerves: Cranial nerves 2-12 are intact.     Sensory: Sensation is intact.     Motor: Motor function is intact.     Coordination: Coordination is intact.     Gait: Gait is intact.  Psychiatric:        Mood and Affect: Mood normal.    ED Results / Procedures / Treatments   Labs (all labs ordered are listed, but only abnormal results are displayed) Labs Reviewed  CBC WITH DIFFERENTIAL/PLATELET - Abnormal; Notable for the following components:      Result Value   RBC 3.14 (*)    Hemoglobin 9.1 (*)    HCT 28.7 (*)    All other components within normal limits  BASIC METABOLIC PANEL - Abnormal; Notable for the following components:   Sodium 134 (*)    Glucose, Bld 146 (*)    BUN 36 (*)    Creatinine, Ser 4.33 (*)    Calcium 8.8 (*)    GFR, Estimated 10 (*)    All other components within normal limits    EKG None  Radiology No  results found.  Procedures Procedures    Medications Ordered in ED Medications - No data to display  ED Course/ Medical Decision Making/ A&P   72 year old with medical problems here for evaluation of myalgias over the last 3 weeks.  No recent medication changes.  No recent trauma or injury.  Did states she supposed be taking potassium supplementation which she intermittently takes.  Pain worse at night, improves during the day.  She has not tried any medications.  Pain to bilateral lower legs, left trapezius region.  She has a nonfocal neuroexam without deficits.  She denies any headache, numbness, weakness> low suspicion for dissection, aneurysm. No fever, neck stiffness, neck rigidity.  Low suspicion for meningitis.  Her pain is reproducible on exam.  No midline cervical tenderness, low suspicion for PE.  No chest pain, shortness of breath, low suspicion for ACS, PE.  No cough, low suspicion for PNA, pneumothorax.  Will plan on checking labs for myalgias.  Of note she is on a statin.  Labs personally viewed and interpreted:  CBC without leukocytosis, hemoglobin 9.1 BMP creatinine 4.3 last I can see in EPIC 3.8  Discussed results with patient.  Will treat with lidocaine patches, muscle relaxer's. Will hold on NSAIDS due to CKD.  Suspect she is likely having muscle spasms.  I did discuss with her possibly discussing her statin use with her PCP.  Low suspicion for acute ACS, PE, dissection, VTE, compartment syndrome, rhabdomyolysis, rhabdomyolysis, fracture, dislocation.  The patient has been appropriately medically screened and/or stabilized in the ED. I have low suspicion for any other emergent medical condition which would require further screening, evaluation or treatment in the ED or require inpatient management.  Patient is hemodynamically stable and in no acute distress.  Patient able to ambulate in department prior to ED.  Evaluation does not show  acute pathology that would require  ongoing or additional emergent interventions while in the emergency department or further inpatient treatment.  I have discussed the diagnosis with the patient and answered all questions.  Pain is been managed while in the emergency department and patient has no further complaints prior to discharge.  Patient is comfortable with plan discussed in room and is stable for discharge at this time.  I have discussed strict return precautions for returning to the emergency department.  Patient was encouraged to follow-up with PCP/specialist refer to at discharge.                                  Medical Decision Making Amount and/or Complexity of Data Reviewed External Data Reviewed: labs, radiology and notes. Labs: ordered. Decision-making details documented in ED Course.  Risk OTC drugs. Prescription drug management. Decision regarding hospitalization. Diagnosis or treatment significantly limited by social determinants of health.         Final Clinical Impression(s) / ED Diagnoses Final diagnoses:  Myalgia  Trapezius muscle spasm  Chronic kidney disease, unspecified CKD stage    Rx / DC Orders ED Discharge Orders          Ordered    lidocaine (LIDODERM) 5 %  Every 24 hours        09/27/23 1510    methocarbamol (ROBAXIN) 500 MG tablet  Daily        09/27/23 1510              Katee Wentland A, PA-C 09/27/23 1623    Gwyneth Sprout, MD 10/04/23 0901

## 2023-09-27 NOTE — ED Notes (Signed)
Pt advised 3x weeks of L trapezius and L lateral neck pain. Mostly posterior chain related. No trauma. Significant underlying stress from grandson being dx with cancer. Pt advised it peaks when going to sleep each night, and resolves mostly with a heat pack to affected area.

## 2023-09-28 ENCOUNTER — Encounter (HOSPITAL_COMMUNITY): Payer: 59

## 2023-10-03 ENCOUNTER — Ambulatory Visit (HOSPITAL_COMMUNITY)
Admission: RE | Admit: 2023-10-03 | Discharge: 2023-10-03 | Disposition: A | Discharge status: Home / Self Care | Payer: 59 | Source: Ambulatory Visit | Attending: Nephrology | Admitting: Nephrology

## 2023-10-03 VITALS — BP 153/70 | HR 78 | Temp 97.2°F | Resp 19

## 2023-10-03 DIAGNOSIS — D631 Anemia in chronic kidney disease: Secondary | ICD-10-CM | POA: Insufficient documentation

## 2023-10-03 DIAGNOSIS — N183 Chronic kidney disease, stage 3 unspecified: Secondary | ICD-10-CM | POA: Insufficient documentation

## 2023-10-03 LAB — IRON AND TIBC
Iron: 87 ug/dL (ref 28–170)
Saturation Ratios: 30 % (ref 10.4–31.8)
TIBC: 288 ug/dL (ref 250–450)
UIBC: 201 ug/dL

## 2023-10-03 LAB — POCT HEMOGLOBIN-HEMACUE: Hemoglobin: 9.4 g/dL — ABNORMAL LOW (ref 12.0–15.0)

## 2023-10-03 LAB — FERRITIN: Ferritin: 727 ng/mL — ABNORMAL HIGH (ref 11–307)

## 2023-10-03 MED ORDER — EPOETIN ALFA-EPBX 10000 UNIT/ML IJ SOLN
15000.0000 [IU] | INTRAMUSCULAR | Status: DC
  Administered 2023-10-03: 15000 [IU] via SUBCUTANEOUS

## 2023-10-03 MED ORDER — EPOETIN ALFA-EPBX 10000 UNIT/ML IJ SOLN
INTRAMUSCULAR | Status: AC
  Filled 2023-10-03: qty 2

## 2023-10-17 ENCOUNTER — Encounter (HOSPITAL_COMMUNITY)
Admission: RE | Admit: 2023-10-17 | Discharge: 2023-10-17 | Disposition: A | Discharge status: Home / Self Care | Payer: 59 | Source: Ambulatory Visit | Attending: Nephrology | Admitting: Nephrology

## 2023-10-17 VITALS — BP 157/66 | HR 80 | Temp 96.3°F

## 2023-10-17 DIAGNOSIS — D631 Anemia in chronic kidney disease: Secondary | ICD-10-CM | POA: Diagnosis present

## 2023-10-17 DIAGNOSIS — N183 Chronic kidney disease, stage 3 unspecified: Secondary | ICD-10-CM | POA: Diagnosis present

## 2023-10-17 MED ORDER — EPOETIN ALFA-EPBX 10000 UNIT/ML IJ SOLN
INTRAMUSCULAR | Status: AC
  Filled 2023-10-17: qty 2

## 2023-10-17 MED ORDER — EPOETIN ALFA-EPBX 10000 UNIT/ML IJ SOLN
15000.0000 [IU] | INTRAMUSCULAR | Status: DC
  Administered 2023-10-17: 15000 [IU] via SUBCUTANEOUS

## 2023-10-18 LAB — POCT HEMOGLOBIN-HEMACUE: Hemoglobin: 9.4 g/dL — ABNORMAL LOW (ref 12.0–15.0)

## 2023-10-23 ENCOUNTER — Encounter: Payer: 59 | Admitting: Obstetrics and Gynecology

## 2023-10-31 ENCOUNTER — Inpatient Hospital Stay (HOSPITAL_COMMUNITY): Admission: RE | Admit: 2023-10-31 | Payer: 59 | Source: Ambulatory Visit

## 2023-11-01 ENCOUNTER — Ambulatory Visit: Payer: 59 | Admitting: Family Medicine

## 2023-11-01 ENCOUNTER — Encounter: Payer: Self-pay | Admitting: Family Medicine

## 2023-11-01 VITALS — BP 135/52 | HR 71 | Ht 65.5 in | Wt 219.1 lb

## 2023-11-01 DIAGNOSIS — Z1339 Encounter for screening examination for other mental health and behavioral disorders: Secondary | ICD-10-CM | POA: Diagnosis not present

## 2023-11-01 DIAGNOSIS — Z01419 Encounter for gynecological examination (general) (routine) without abnormal findings: Secondary | ICD-10-CM | POA: Diagnosis not present

## 2023-11-01 NOTE — Progress Notes (Signed)
ANNUAL EXAM Patient name: Veronica Simmons MRN [id number]  Date of birth: Apr 27, 1951 Chief Complaint:   Annual Exam  History of Present Illness:   Veronica Simmons is a 72 y.o.  [phone]  female  being seen today for a routine annual exam.  Current complaints: none - she is here because she is on a kidney transplant list need to be checked out.  No LMP recorded. Patient is postmenopausal.    Last pap n/a. Last mammogram: 02/2023. Results were: normal. Family h/o breast cancer: no Last colonoscopy:      10/25/2016    2:52 PM 07/20/2016   11:36 AM 06/19/2016   11:41 AM 05/11/2016    1:56 PM 04/28/2016   11:58 AM  Depression screen PHQ 2/9  Decreased Interest 0 2 0 0 0  Down, Depressed, Hopeless 0 1 0 0 0  PHQ - 2 Score 0 3 0 0 0  Altered sleeping  3     Tired, decreased energy  3     Change in appetite  0     Feeling bad or failure about yourself   1     Trouble concentrating  1     Moving slowly or fidgety/restless  1     Suicidal thoughts  1     PHQ-9 Score  13     Difficult doing work/chores  Somewhat difficult            No data to display           Review of Systems:   Pertinent items are noted in HPI Denies any headaches, blurred vision, fatigue, shortness of breath, chest pain, abdominal pain, abnormal vaginal discharge/itching/odor/irritation, problems with periods, bowel movements, urination, or intercourse unless otherwise stated above. Pertinent History Reviewed:  Reviewed past medical,surgical, social and family history.  Reviewed problem list, medications and allergies. Physical Assessment:   Vitals:   11/01/23 1046 11/01/23 1057  BP: (!) 133/48 (!) 135/52  Pulse: 71 71  Weight: 219 lb 1.9 oz (99.4 kg)   Height: 5' 5.5" (1.664 m)   Body mass index is 35.91 kg/m.        Physical Examination:   General appearance - well appearing, and in no distress  Mental status - alert, oriented to person, place, and time  Psych:  She has a  normal mood and affect  Skin - warm and dry, normal color, no suspicious lesions noted  Chest - effort normal, all lung fields clear to auscultation bilaterally  Heart - normal rate and regular rhythm  Neck:  midline trachea, no thyromegaly or nodules  Breasts - breasts appear normal, no suspicious masses, no skin or nipple changes or axillary nodes  Abdomen - soft, nontender, nondistended, no masses or organomegaly  Pelvic - VULVA: normal appearing vulva with no masses, tenderness or lesions  VAGINA: normal appearing vagina with normal color and discharge, no lesions  CERVIX: normal appearing cervix without discharge or lesions, no CMT  Thin prep pap is not done   UTERUS: uterus is felt to be normal size, shape, consistency and nontender   ADNEXA: No adnexal masses or tenderness noted.  Extremities:  No swelling or varicosities noted  Chaperone present for exam  Assessment & Plan:  1. Well woman exam with routine gynecological exam No PAP needed.  Last mammogram normal. Schedule Dexa - MM 3D SCREENING MAMMOGRAM BILATERAL BREAST; Future - DG Bone Density; Future   Labs/procedures today:   No orders of the defined  types were placed in this encounter.   Meds: No orders of the defined types were placed in this encounter.   Follow-up: No follow-ups on file.  Levie Heritage, DO 11/01/2023 11:23 AM

## 2023-11-06 ENCOUNTER — Ambulatory Visit (HOSPITAL_BASED_OUTPATIENT_CLINIC_OR_DEPARTMENT_OTHER)
Admission: RE | Admit: 2023-11-06 | Discharge: 2023-11-06 | Disposition: A | Discharge status: Home / Self Care | Payer: 59 | Source: Ambulatory Visit | Attending: Family Medicine | Admitting: Family Medicine

## 2023-11-06 DIAGNOSIS — Z780 Asymptomatic menopausal state: Secondary | ICD-10-CM | POA: Diagnosis not present

## 2023-11-06 DIAGNOSIS — E559 Vitamin D deficiency, unspecified: Secondary | ICD-10-CM | POA: Insufficient documentation

## 2023-11-06 DIAGNOSIS — Z13820 Encounter for screening for osteoporosis: Secondary | ICD-10-CM | POA: Diagnosis present

## 2023-11-06 DIAGNOSIS — E119 Type 2 diabetes mellitus without complications: Secondary | ICD-10-CM | POA: Insufficient documentation

## 2023-11-06 DIAGNOSIS — M8589 Other specified disorders of bone density and structure, multiple sites: Secondary | ICD-10-CM | POA: Diagnosis not present

## 2023-11-06 DIAGNOSIS — Z01419 Encounter for gynecological examination (general) (routine) without abnormal findings: Secondary | ICD-10-CM | POA: Insufficient documentation

## 2023-11-09 ENCOUNTER — Encounter (HOSPITAL_COMMUNITY)
Admission: RE | Admit: 2023-11-09 | Discharge: 2023-11-09 | Disposition: A | Discharge status: Home / Self Care | Payer: 59 | Source: Ambulatory Visit | Attending: Nephrology | Admitting: Nephrology

## 2023-11-09 VITALS — BP 150/64 | HR 79 | Temp 97.2°F

## 2023-11-09 DIAGNOSIS — N183 Chronic kidney disease, stage 3 unspecified: Secondary | ICD-10-CM | POA: Diagnosis present

## 2023-11-09 DIAGNOSIS — D631 Anemia in chronic kidney disease: Secondary | ICD-10-CM | POA: Insufficient documentation

## 2023-11-09 LAB — IRON AND TIBC
Iron: 139 ug/dL (ref 28–170)
Saturation Ratios: 42 % — ABNORMAL HIGH (ref 10.4–31.8)
TIBC: 329 ug/dL (ref 250–450)
UIBC: 190 ug/dL

## 2023-11-09 LAB — POCT HEMOGLOBIN-HEMACUE: Hemoglobin: 10.6 g/dL — ABNORMAL LOW (ref 12.0–15.0)

## 2023-11-09 LAB — FERRITIN: Ferritin: 753 ng/mL — ABNORMAL HIGH (ref 11–307)

## 2023-11-09 MED ORDER — EPOETIN ALFA-EPBX 10000 UNIT/ML IJ SOLN
INTRAMUSCULAR | Status: AC
  Filled 2023-11-09: qty 2

## 2023-11-09 MED ORDER — EPOETIN ALFA-EPBX 10000 UNIT/ML IJ SOLN
15000.0000 [IU] | INTRAMUSCULAR | Status: DC
Start: 2023-11-09 — End: 2023-11-10
  Administered 2023-11-09: 15000 [IU] via SUBCUTANEOUS

## 2023-11-14 ENCOUNTER — Encounter (HOSPITAL_COMMUNITY): Payer: 59

## 2023-11-21 ENCOUNTER — Encounter (HOSPITAL_COMMUNITY): Payer: 59

## 2023-11-23 ENCOUNTER — Encounter (HOSPITAL_COMMUNITY): Payer: 59

## 2023-11-28 ENCOUNTER — Ambulatory Visit (HOSPITAL_COMMUNITY)
Admission: RE | Admit: 2023-11-28 | Discharge: 2023-11-28 | Disposition: A | Discharge status: Home / Self Care | Payer: 59 | Source: Ambulatory Visit | Attending: Nephrology | Admitting: Nephrology

## 2023-11-28 VITALS — BP 123/56 | HR 73 | Temp 97.6°F | Resp 18

## 2023-11-28 DIAGNOSIS — D631 Anemia in chronic kidney disease: Secondary | ICD-10-CM | POA: Diagnosis present

## 2023-11-28 DIAGNOSIS — N183 Chronic kidney disease, stage 3 unspecified: Secondary | ICD-10-CM | POA: Insufficient documentation

## 2023-11-28 LAB — POCT HEMOGLOBIN-HEMACUE: Hemoglobin: 9.6 g/dL — ABNORMAL LOW (ref 12.0–15.0)

## 2023-11-28 MED ORDER — EPOETIN ALFA-EPBX 10000 UNIT/ML IJ SOLN
15000.0000 [IU] | INTRAMUSCULAR | Status: DC
Start: 2023-11-28 — End: 2023-11-29
  Administered 2023-11-28: 15000 [IU] via SUBCUTANEOUS

## 2023-11-28 MED ORDER — EPOETIN ALFA-EPBX 10000 UNIT/ML IJ SOLN
INTRAMUSCULAR | Status: AC
  Filled 2023-11-28: qty 2

## 2023-12-05 ENCOUNTER — Encounter (HOSPITAL_COMMUNITY): Payer: 59

## 2023-12-13 ENCOUNTER — Ambulatory Visit (HOSPITAL_COMMUNITY)
Admission: RE | Admit: 2023-12-13 | Discharge: 2023-12-13 | Disposition: A | Discharge status: Home / Self Care | Payer: 59 | Source: Ambulatory Visit | Attending: Nephrology | Admitting: Nephrology

## 2023-12-13 VITALS — BP 140/59 | HR 79 | Temp 97.5°F | Resp 18

## 2023-12-13 DIAGNOSIS — D631 Anemia in chronic kidney disease: Secondary | ICD-10-CM | POA: Diagnosis present

## 2023-12-13 DIAGNOSIS — N183 Chronic kidney disease, stage 3 unspecified: Secondary | ICD-10-CM | POA: Diagnosis present

## 2023-12-13 LAB — IRON AND TIBC
Iron: 123 ug/dL (ref 28–170)
Saturation Ratios: 41 % — ABNORMAL HIGH (ref 10.4–31.8)
TIBC: 302 ug/dL (ref 250–450)
UIBC: 179 ug/dL

## 2023-12-13 LAB — POCT HEMOGLOBIN-HEMACUE: Hemoglobin: 9.8 g/dL — ABNORMAL LOW (ref 12.0–15.0)

## 2023-12-13 LAB — FERRITIN: Ferritin: 720 ng/mL — ABNORMAL HIGH (ref 11–307)

## 2023-12-13 MED ORDER — EPOETIN ALFA-EPBX 10000 UNIT/ML IJ SOLN
INTRAMUSCULAR | Status: AC
  Filled 2023-12-13: qty 2

## 2023-12-13 MED ORDER — EPOETIN ALFA-EPBX 10000 UNIT/ML IJ SOLN
15000.0000 [IU] | INTRAMUSCULAR | Status: DC
Start: 2023-12-13 — End: 2023-12-14
  Administered 2023-12-13: 15000 [IU] via SUBCUTANEOUS

## 2023-12-20 ENCOUNTER — Encounter (HOSPITAL_COMMUNITY)
Admission: RE | Disposition: A | Discharge status: Home / Self Care | Payer: Self-pay | Source: Home / Self Care | Attending: Nephrology

## 2023-12-20 ENCOUNTER — Ambulatory Visit (HOSPITAL_COMMUNITY)
Admission: RE | Admit: 2023-12-20 | Discharge: 2023-12-20 | Disposition: A | Discharge status: Home / Self Care | Payer: 59 | Attending: Nephrology | Admitting: Nephrology

## 2023-12-20 ENCOUNTER — Other Ambulatory Visit: Payer: Self-pay

## 2023-12-20 DIAGNOSIS — Z992 Dependence on renal dialysis: Secondary | ICD-10-CM | POA: Diagnosis not present

## 2023-12-20 DIAGNOSIS — I132 Hypertensive heart and chronic kidney disease with heart failure and with stage 5 chronic kidney disease, or end stage renal disease: Secondary | ICD-10-CM | POA: Insufficient documentation

## 2023-12-20 DIAGNOSIS — D631 Anemia in chronic kidney disease: Secondary | ICD-10-CM | POA: Diagnosis not present

## 2023-12-20 DIAGNOSIS — E785 Hyperlipidemia, unspecified: Secondary | ICD-10-CM | POA: Insufficient documentation

## 2023-12-20 DIAGNOSIS — Z7985 Long-term (current) use of injectable non-insulin antidiabetic drugs: Secondary | ICD-10-CM | POA: Insufficient documentation

## 2023-12-20 DIAGNOSIS — Z79899 Other long term (current) drug therapy: Secondary | ICD-10-CM | POA: Insufficient documentation

## 2023-12-20 DIAGNOSIS — Z794 Long term (current) use of insulin: Secondary | ICD-10-CM | POA: Insufficient documentation

## 2023-12-20 DIAGNOSIS — N186 End stage renal disease: Secondary | ICD-10-CM | POA: Insufficient documentation

## 2023-12-20 DIAGNOSIS — E1122 Type 2 diabetes mellitus with diabetic chronic kidney disease: Secondary | ICD-10-CM | POA: Insufficient documentation

## 2023-12-20 DIAGNOSIS — I5032 Chronic diastolic (congestive) heart failure: Secondary | ICD-10-CM | POA: Diagnosis not present

## 2023-12-20 HISTORY — PX: DIALYSIS/PERMA CATHETER INSERTION: CATH118288

## 2023-12-20 LAB — POCT I-STAT, CHEM 8
BUN: 43 mg/dL — ABNORMAL HIGH (ref 8–23)
Calcium, Ion: 1.15 mmol/L (ref 1.15–1.40)
Chloride: 105 mmol/L (ref 98–111)
Creatinine, Ser: 5.9 mg/dL — ABNORMAL HIGH (ref 0.44–1.00)
Glucose, Bld: 157 mg/dL — ABNORMAL HIGH (ref 70–99)
HCT: 31 % — ABNORMAL LOW (ref 36.0–46.0)
Hemoglobin: 10.5 g/dL — ABNORMAL LOW (ref 12.0–15.0)
Potassium: 4.9 mmol/L (ref 3.5–5.1)
Sodium: 138 mmol/L (ref 135–145)
TCO2: 24 mmol/L (ref 22–32)

## 2023-12-20 SURGERY — DIALYSIS/PERMA CATHETER INSERTION
Anesthesia: LOCAL

## 2023-12-20 MED ORDER — MIDAZOLAM HCL 2 MG/2ML IJ SOLN
INTRAMUSCULAR | Status: AC
  Filled 2023-12-20: qty 2

## 2023-12-20 MED ORDER — FENTANYL CITRATE (PF) 100 MCG/2ML IJ SOLN
INTRAMUSCULAR | Status: AC
  Filled 2023-12-20: qty 2

## 2023-12-20 MED ORDER — LIDOCAINE HCL (PF) 1 % IJ SOLN
INTRAMUSCULAR | Status: AC
  Filled 2023-12-20: qty 30

## 2023-12-20 MED ORDER — HEPARIN SODIUM (PORCINE) 1000 UNIT/ML IJ SOLN
INTRAMUSCULAR | Status: DC | PRN
  Administered 2023-12-20 (×2): 1600 [IU] via INTRAVENOUS

## 2023-12-20 MED ORDER — HEPARIN (PORCINE) IN NACL 1000-0.9 UT/500ML-% IV SOLN
INTRAVENOUS | Status: DC | PRN
  Administered 2023-12-20: 500 mL

## 2023-12-20 MED ORDER — HEPARIN SODIUM (PORCINE) 1000 UNIT/ML IJ SOLN
INTRAMUSCULAR | Status: AC
  Filled 2023-12-20: qty 10

## 2023-12-20 MED ORDER — ONDANSETRON HCL 4 MG/2ML IJ SOLN
4.0000 mg | Freq: Four times a day (QID) | INTRAMUSCULAR | Status: DC | PRN
Start: 2023-12-20 — End: 2023-12-20

## 2023-12-20 MED ORDER — LIDOCAINE HCL (PF) 1 % IJ SOLN
INTRAMUSCULAR | Status: DC | PRN
  Administered 2023-12-20: 20 mL via INTRADERMAL

## 2023-12-20 MED ORDER — TRAMADOL HCL 50 MG PO TABS: 50.0000 mg | ORAL_TABLET | Freq: Four times a day (QID) | ORAL | 0 refills | Status: AC | PRN

## 2023-12-20 MED ORDER — SODIUM CHLORIDE 0.9% FLUSH: 3.0000 mL | INTRAVENOUS | Status: DC | PRN

## 2023-12-20 MED ORDER — FENTANYL CITRATE (PF) 100 MCG/2ML IJ SOLN
INTRAMUSCULAR | Status: DC | PRN
  Administered 2023-12-20 (×2): 25 ug via INTRAVENOUS

## 2023-12-20 MED ORDER — SODIUM CHLORIDE 0.9 % IV SOLN: INTRAVENOUS | Status: DC

## 2023-12-20 MED ORDER — MIDAZOLAM HCL 2 MG/2ML IJ SOLN
INTRAMUSCULAR | Status: DC | PRN
  Administered 2023-12-20: 1 mg via INTRAVENOUS

## 2023-12-20 MED ORDER — ACETAMINOPHEN 325 MG PO TABS
650.0000 mg | ORAL_TABLET | ORAL | Status: DC | PRN
Start: 2023-12-20 — End: 2023-12-20

## 2023-12-20 SURGICAL SUPPLY — 7 items
BAG SNAP BAND KOVER 36X36 (MISCELLANEOUS) IMPLANT
CATH PALINDROME 19 SP (CATHETERS) IMPLANT
COVER DOME SNAP 22 D (MISCELLANEOUS) IMPLANT
GLIDEWIRE ANGLED SS 035X260CM (WIRE) IMPLANT
KIT MICROPUNCTURE NIT STIFF (SHEATH) IMPLANT
SHEATH PROBE COVER 6X72 (BAG) IMPLANT
TRAY PV CATH (CUSTOM PROCEDURE TRAY) ×2 IMPLANT

## 2023-12-20 NOTE — H&P (Signed)
 Chief Complaint: Need of dialysis catheter placement HPI:  73 year old woman with past medical history significant for hypertension, type 2 diabetes mellitus, HFpEF, dyslipidemia and progressive chronic kidney disease now with development of uremic symptoms/progression to end-stage renal disease.  She is referred for placement of a dialysis catheter to initiate dialysis (process of outpatient dialysis unit placement in progress).  She denies any chest pain and has some exertional dyspnea but denies any shortness of breath on laying flat.  She denies any fevers or chills and has decreased appetite without nausea or vomiting.  Last meal was between 7 and 8 PM last night.  Past Medical History:  Diagnosis Date   ALLERGIC RHINITIS 02/19/2007   Qualifier: Diagnosis of  By: FRANCO GASKINS MD, VALENICA     Allergy    seasonal   Anemia 1970s   on iron    Arthritis    Cubital tunnel syndrome on right 2020   Secondary to old fracture. Evaluated by ortho. [facility] offered surgery but patient refused. Numbness and burning pain 4th and 5th digit.     Daytime sleepiness    Diabetes mellitus 2000   Never on insulin     High cholesterol    Hypertension 2000   Leg cramps     Past Surgical History:  Procedure Laterality Date   DG HAND RIGHT COMPLETE ([facility] HX) Right 2020   right hand/wrist/elbow surgery to fix carpel tunnel/pinched nerve    Family History  Problem Relation Age of Onset   Early death Mother 30       shot    Heart disease Father 32   Diabetes Daughter    Kidney disease Daughter        on dialysis    Colon polyps Daughter    Diabetes Maternal Aunt    Cancer Paternal Uncle        Breast Cancer    Breast cancer Maternal Grandmother    Colon cancer Neg Hx    Esophageal cancer Neg Hx    Stomach cancer Neg Hx    Rectal cancer Neg Hx    Social History:  reports that she has never smoked. She has never used smokeless tobacco. She reports that she does not drink alcohol  and does not use drugs.  Allergies:  Allergies  Allergen Reactions   Levofloxacin Hives    Medications Prior to Admission  Medication Sig Dispense Refill   amLODipine  (NORVASC ) 10 MG tablet Take 10 mg by mouth every morning.     furosemide  (LASIX ) 80 MG tablet Take 80 mg by mouth daily.     hydrALAZINE  (APRESOLINE ) 25 MG tablet Take 25 mg by mouth in the morning and at bedtime.     insulin  glargine, 2 Unit Dial , (TOUJEO MAX SOLOSTAR) 300 UNIT/ML Solostar Pen Inject 10 Units into the skin daily as needed (high blood sugar).     insulin  lispro (HUMALOG ) 100 UNIT/ML KwikPen Inject 0.01-0.09 mLs (1-9 Units total) into the skin 3 (three) times daily before meals. Blood sugars 70-120: no Insulin  121-150: 2 units 151-200: 3 units 201-250: 4 units 251-300: 5 units 301-350: 6 units 351-400: 9 units More than 400 use 9 units and call your doctor 15 mL 11   Iron-Vitamin C 100-250 MG TABS Take 1 tablet by mouth in the morning and at bedtime.     lidocaine  (LIDODERM ) 5 % Place 1 patch onto the skin daily. Remove & Discard patch within 12 hours or as directed by MD 30 patch 0  losartan  (COZAAR ) 25 MG tablet Take 25 mg by mouth daily.     methocarbamol  (ROBAXIN ) 500 MG tablet Take 1 tablet (500 mg total) by mouth daily. (Patient taking differently: Take 500 mg by mouth daily as needed for muscle spasms.) 7 tablet 0   OVER THE COUNTER MEDICATION Take 1 tablet by mouth 2 (two) times daily. For Leg Cramps     OZEMPIC, 2 MG/DOSE, 8 MG/3ML SOPN Inject 2 mg into the skin once a week.     PRESCRIPTION MEDICATION IRON INFUSIONS as needed. [facility]).     propranolol (INDERAL) 40 MG tablet Take 40 mg by mouth 2 (two) times daily.     traZODone  (DESYREL ) 100 MG tablet Take 100 mg by mouth at bedtime.     glucose blood (ACCU-CHEK AVIVA PLUS) test strip Use as instructed (Patient taking differently: 1 each by Other route as directed. Use as instructed) 100 each 12   glucose blood (ACCU-CHEK SMARTVIEW) test strip Test  blood sugar once daily (Patient taking differently: 1 each by Other route See admin instructions. Test blood sugar once daily) 100 each 2   glucose blood test strip Use as instructed (Patient taking differently: 1 each by Other route as directed. Use as instructed) 100 each 12   Lancets (ONETOUCH ULTRASOFT) lancets Use as instructed (Patient taking differently: 1 each by Other route as directed. Use as instructed) 100 each 12    No results found for this or any previous visit (from the past 48 hours). No results found.  Review of Systems  Constitutional:  Positive for fatigue. Negative for chills and fever.  HENT:  Negative for nosebleeds, sinus pressure and sore throat.   Eyes:  Negative for photophobia and visual disturbance.  Respiratory:  Negative for cough and shortness of breath.   Cardiovascular:  Negative for chest pain and leg swelling.  Gastrointestinal:  Negative for abdominal pain, diarrhea, nausea and vomiting.  Genitourinary:  Negative for dysuria and hematuria.  Musculoskeletal:  Negative for back pain and joint swelling.    There were no vitals taken for this visit. Physical Exam Vitals and nursing note reviewed.  Constitutional:      General: She is not in acute distress.    Appearance: Normal appearance.  HENT:     Head: Normocephalic and atraumatic.     Right Ear: External ear normal.     Left Ear: External ear normal.     Nose: Nose normal.     Mouth/Throat:     Mouth: Mucous membranes are dry.     Pharynx: Oropharynx is clear.  Eyes:     Extraocular Movements: Extraocular movements intact.     Conjunctiva/sclera: Conjunctivae normal.  Cardiovascular:     Rate and Rhythm: Normal rate and regular rhythm.     Pulses: Normal pulses.     Heart sounds: Normal heart sounds.  Pulmonary:     Effort: Pulmonary effort is normal.     Breath sounds: Normal breath sounds. No wheezing or rales.  Abdominal:     General: Abdomen is flat. Bowel sounds are normal.      Palpations: Abdomen is soft.     Tenderness: There is no guarding.  Musculoskeletal:     Cervical back: Normal range of motion and neck supple.     Right lower leg: Edema present.     Left lower leg: Edema present.     Comments: Trace edema bilateral ankles  Skin:    General: Skin is warm and dry.  Neurological:     Mental Status: She is alert and oriented to person, place, and time.      Assessment/Plan 1.  Progressive chronic kidney disease-now end-stage renal disease.  Presents for dialysis catheter placement to initiate hemodialysis.  Procedure explained in detail and patient willing to proceed. 2.  Hypertension: Blood pressure within acceptable range, continuous monitoring to be undertaken during procedure. 3.  Anemia: On outpatient management with ESA that will be transitioned to ESA with hemodialysis.  Gordy MARLA Blanch, MD 12/20/2023, 7:23 AM

## 2023-12-20 NOTE — Op Note (Signed)
 Patient presents for tunneled catheter insertion to initiate dialysis; ultrasound shows a good right IJ. The decision was made to place a RIJ tunneled dialysis catheter.  Summary:  1) Successful placement of a new 19 cm cuff to tip hemodialysis catheter (Palindrome) in the right internal jugular vein with the tip in the right atrium.  2) Recommendations to the dialysis unit TO NOT USE HEPARIN  FOR THE FIRST 24 HOURS.  Description of procedure: The right neck, chest and the catheter were prepped and draped in the usual sterile fashion.  Local anesthesia was provided by injecting lidocaine  1% at the neck site overlying the desired venotomy site. Using real time ultrasound guidance, I was able to cannulate the RIJ and advance the guidewire easily into the central circulation. The wire was then manipulated and advanced into the abdominal IVC. I then blunt dissected the tissue around the 5 Fr stylet until it was freely mobile. Then, after injecting local anesthesia into the desired track and exit site I made a small incision at the exit site and tunneled a 19cm cuff to tip Palindrome dialysis catheter, pulling it out at the venotomy site. Sequential dilation with 12, 14, and finally the peelaway sheath were done with real time fluoroscopic guidance ensuring that we were straight always lined with the wire.   Then, I inserted the catheter over the wire and through the sheath. After removing the peelaway sheath, I adjusted the catheter until the tip of the catheter was positioned in the right atrium of the heart. The cuff of the catheter was positioned in the subcutaneous tunnel with the cuff approximately 2 cm from the exit site.   Both limbs of the catheter were aspirated and flushed with excellent flow noted. Both limbs of the catheter were locked with heparin  and sterile caps placed. The hub of the catheter was sutured to the chest wall with 2-0 nylon suture.   The neck incision was closed with a vertical  mattress 3-0 plain gut sutures and a purse-string 3-0 plain gut was placed at the tunnel exit site. 2-0 loose nylon sutures secured the hub to the chest wall.                         Sterile dressings were placed, and the patient returned to recovery in stable condition.  Sedation: 1mg  Versed , 50mcg Fentanyl .  Contrast. 0 mL  Monitoring: Because of the patient's comorbid conditions and sedation during the procedure, continuous EKG monitoring and O2 saturation monitoring was performed throughout the procedure by the RN. There were no abnormal arrhythmias encountered.  Complications: None.  Diagnoses:   N18.6 End stage renal disease Z99.2 Dependence on renal dialysis  Procedures Coding:  36558 Tunneled catheter insertion [id number] Ultrasound guidance 22998  Fluoroscopy guidance for catheter insertion. V0037 Contrast  Recommendations: Remove the suture in 3 weeks. 2.   Report any blood flow problems to [facility].  Discharge: The patient was discharged home in stable condition. The patient was given education regarding the care of the catheter and specific instructions in case of any problems.

## 2023-12-20 NOTE — Discharge Instructions (Addendum)
 OK to DC home at 11am  General care instructions: - Do not drive or operate heavy machinery for 24hrs - Avoid making any important decisions for the remainder of the day. - You should be able to eat, drink, and resume your normal medications. - Avoid any strenuous activity for the remainder of the day. Potential complications: - You are bleeding at the exit or venotomy site and it will not stop with direct pressure; if there is a slow ooze apply pressure over the venotomy site neck region where the catheter can be felt for 5 minutes.  - You have a fever, swelling, see redness or feel heat over the tunnel or exit site. 3.  Medication instructions: - Continue routine medications unless otherwise instructed. 4. Please have your sutures removed at the hub site in 3 weeks at dialysis.

## 2023-12-21 ENCOUNTER — Encounter (HOSPITAL_COMMUNITY): Payer: Self-pay | Admitting: Nephrology

## 2023-12-21 MED FILL — Lidocaine HCl Local Preservative Free (PF) Inj 1%: INTRAMUSCULAR | Qty: 30 | Status: AC

## 2023-12-21 MED FILL — Lidocaine HCl Local Preservative Free (PF) Inj 1%: INTRAMUSCULAR | Qty: 30 | Status: CN

## 2023-12-27 ENCOUNTER — Inpatient Hospital Stay (HOSPITAL_COMMUNITY): Admission: RE | Admit: 2023-12-27 | Payer: 59 | Source: Ambulatory Visit

## 2024-01-10 ENCOUNTER — Encounter (HOSPITAL_COMMUNITY): Payer: 59

## 2024-02-21 ENCOUNTER — Ambulatory Visit: Payer: 59 | Admitting: Family Medicine

## 2024-02-21 VITALS — BP 152/76 | HR 69 | Ht 65.0 in | Wt 211.0 lb

## 2024-02-21 DIAGNOSIS — E1140 Type 2 diabetes mellitus with diabetic neuropathy, unspecified: Secondary | ICD-10-CM | POA: Diagnosis not present

## 2024-02-21 DIAGNOSIS — I10 Essential (primary) hypertension: Secondary | ICD-10-CM

## 2024-02-21 DIAGNOSIS — R232 Flushing: Secondary | ICD-10-CM

## 2024-02-21 MED ORDER — PAROXETINE HCL 10 MG PO TABS: 10.0000 mg | ORAL_TABLET | Freq: Every day | ORAL | 4 refills | Status: DC

## 2024-02-21 NOTE — Progress Notes (Signed)
   Subjective:    Patient ID: Veronica Simmons, female    DOB: 08/06/1951, 73 y.o.   MRN: [id number]  HPI Patient seen for hot flashes. Has been going on since she went through menopause.  She has night sweats every night and several hot flashes daily.  She currently takes black cohosh and primrose oil when she is mild to moderately helpful. She does have kidney failure and had a catheter placed for dialysis. She also has type 2 diabetes and high blood pressure.   Review of Systems     Objective:   Physical Exam Vitals reviewed.  Constitutional:      Appearance: Normal appearance.  Neurological:     Mental Status: She is alert.  Psychiatric:        Mood and Affect: Mood normal.        Thought Content: Thought content normal.        Judgment: Judgment normal.       Assessment & Plan:  1. Hot flashes (Primary) Due to her comorbidities, not eligible for HRT. Discussed nonhormonal treatments - will start paxil. Continue herbal remedies. Discussed side effects of medication. F/u in 3 months  2. Type 2 diabetes, controlled, with neuropathy (HCC)  3. HYPERTENSION, BENIGN SYSTEMIC

## 2024-02-25 ENCOUNTER — Other Ambulatory Visit: Payer: Self-pay | Admitting: *Deleted

## 2024-02-25 DIAGNOSIS — N186 End stage renal disease: Secondary | ICD-10-CM

## 2024-02-27 NOTE — Progress Notes (Deleted)
 Office Note     CC:  ESRD Requesting Provider:  Anthony Sar, MD  HPI: Veronica Simmons is a {Handed:22697} handed 73 y.o. (03-10-1951) female with kidney disease who presents at the request of Anthony Sar, MD for permanent HD access. The patient has had *** prior access procedures. Per pt, previous tunneled lines have been placed in ***. Current access is ***. Dialysis days are ***.   On exam, ***  The pt is *** on a statin for cholesterol management.  The pt is *** on a daily aspirin.   Other AC:  *** The pt is *** on medications for hypertension.   The pt is *** diabetic. Tobacco hx:  ***  Past Medical History:  Diagnosis Date   ALLERGIC RHINITIS 02/19/2007   Qualifier: Diagnosis of  By: Janeece Fitting MD, VALENICA     Allergy    seasonal   Anemia 1970s   on iron    Arthritis    Cubital tunnel syndrome on right 2020   Secondary to old fracture. Evaluated by ortho. [facility] offered surgery but patient refused. Numbness and burning pain 4th and 5th digit.     Daytime sleepiness    Diabetes mellitus 2000   Never on insulin    High cholesterol    Hypertension 2000   Leg cramps     Past Surgical History:  Procedure Laterality Date   DG HAND RIGHT COMPLETE ([facility] HX) Right 2020   right hand/wrist/elbow surgery to fix carpel tunnel/pinched nerve   DIALYSIS/PERMA CATHETER INSERTION N/A 12/20/2023   Procedure: DIALYSIS/PERMA CATHETER INSERTION;  Surgeon: Dagoberto Ligas, MD;  Location: [facility] INVASIVE CV LAB;  Service: Cardiovascular;  Laterality: N/A;    Social History   Socioeconomic History   Marital status: Divorced    Spouse name: Not on file   Number of children: 4   Years of education: Masters Cu   Highest education level: Not on file  Occupational History   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  Divorced 4 kids. Numerous grandkids, and great grankids.        Social Drivers of Health   Financial Resource Strain: Medium Risk (11/05/2020)   Received from [facility], [facility]   Overall Financial Resource Strain (CARDIA)    Difficulty of Paying Living Expenses: Somewhat hard  Food Insecurity: No Food Insecurity (10/12/2022)   Received from [facility], [facility]   Hunger Vital Sign    Worried About Running Out of Food in the Last Year: Never true    Ran Out of Food in the Last Year: Never true  Transportation Needs: No Transportation Needs (11/05/2020)   Received from [facility], [facility]   PRAPARE - Transportation    Lack of Transportation (Medical): No    Lack of Transportation (Non-Medical): No  Physical Activity: Sufficiently Active (11/05/2020)   Received from [facility], [facility]   Exercise Vital Sign    Days of Exercise per Week: 7 days  Minutes of Exercise per Session: 30 min  Stress: No Stress Concern Present (11/05/2020)   Received from [facility], [facility] - Occupational Stress Questionnaire    Feeling of Stress : Not at all  Social Connections: Unknown (05/01/2022)   Received from [facility], [facility]   Social Network    Social Network: Not on file  Intimate Partner Violence: Unknown (03/23/2022)   Received from [facility], [facility]   HITS    Physically Hurt: Not on file    Insult or Talk Down To: Not on file    Threaten Physical Harm: Not on file    Scream or Curse: Not on file   *** Family History  Problem Relation Age of Onset   Early death  Mother 30       shot    Heart disease Father 87   Diabetes Daughter    Kidney disease Daughter        on dialysis    Colon polyps Daughter    Diabetes Maternal Aunt    Cancer Paternal Uncle        Breast Cancer    Breast cancer Maternal Grandmother    Colon cancer Neg Hx    Esophageal cancer Neg Hx    Stomach cancer Neg Hx    Rectal cancer Neg Hx     Current Outpatient Medications  Medication Sig Dispense Refill   amLODipine (NORVASC) 10 MG tablet Take 10 mg by mouth every morning.     furosemide (LASIX) 80 MG tablet Take 80 mg by mouth daily.     glucose blood (ACCU-CHEK AVIVA PLUS) test strip Use as instructed (Patient taking differently: 1 each by Other route as directed. Use as instructed) 100 each 12   glucose blood (ACCU-CHEK SMARTVIEW) test strip Test blood sugar once daily (Patient taking differently: 1 each by Other route See admin instructions. Test blood sugar once daily) 100 each 2   glucose blood test strip Use as instructed (Patient taking differently: 1 each by Other route as directed. Use as instructed) 100 each 12   hydrALAZINE (APRESOLINE) 25 MG tablet Take 25 mg by mouth in the morning and at bedtime.     insulin glargine, 2 Unit Dial, (TOUJEO MAX SOLOSTAR) 300 UNIT/ML Solostar Pen Inject 10 Units into the skin daily as needed (high blood sugar).     insulin lispro (HUMALOG) 100 UNIT/ML KwikPen Inject 0.01-0.09 mLs (1-9 Units total) into the skin 3 (three) times daily before meals. Blood sugars 70-120: no Insulin 121-150: 2 units 151-200: 3 units 201-250: 4 units 251-300: 5 units 301-350: 6 units 351-400: 9 units More than 400 use 9 units and call your doctor 15 mL 11   Iron-Vitamin C 100-250 MG TABS Take 1 tablet by mouth in the morning and at bedtime.     Lancets (ONETOUCH ULTRASOFT) lancets Use as instructed (Patient taking differently: 1 each by Other route as directed. Use as instructed) 100 each 12   lidocaine (LIDODERM) 5 % Place 1 patch onto the skin daily.  Remove & Discard patch within 12 hours or as directed by MD 30 patch 0   losartan (COZAAR) 25 MG tablet Take 25 mg by mouth daily.     methocarbamol (ROBAXIN) 500 MG tablet Take 1 tablet (500 mg total) by mouth daily. (Patient not taking: Reported on 02/21/2024) 7 tablet 0   OVER THE COUNTER MEDICATION Take 1 tablet by mouth 2 (two) times daily. For  Leg Cramps     OZEMPIC, 2 MG/DOSE, 8 MG/3ML SOPN Inject 2 mg into the skin once a week.     PARoxetine (PAXIL) 10 MG tablet Take 1 tablet (10 mg total) by mouth at bedtime. 30 tablet 4   PRESCRIPTION MEDICATION IRON INFUSIONS as needed. [facility]).     propranolol (INDERAL) 40 MG tablet Take 40 mg by mouth 2 (two) times daily. (Patient not taking: Reported on 02/21/2024)     traMADol (ULTRAM) 50 MG tablet Take 1 tablet (50 mg total) by mouth every 6 (six) hours as needed. 20 tablet 0   traZODone (DESYREL) 100 MG tablet Take 100 mg by mouth at bedtime.     No current facility-administered medications for this visit.    Allergies  Allergen Reactions   Levofloxacin Hives     REVIEW OF SYSTEMS:  *** [X]  denotes positive finding, [ ]  denotes negative finding Cardiac  Comments:  Chest pain or chest pressure:    Shortness of breath upon exertion:    Short of breath when lying flat:    Irregular heart rhythm:        Vascular    Pain in calf, thigh, or hip brought on by ambulation:    Pain in feet at night that wakes you up from your sleep:     Blood clot in your veins:    Leg swelling:         Pulmonary    Oxygen at home:    Productive cough:     Wheezing:         Neurologic    Sudden weakness in arms or legs:     Sudden numbness in arms or legs:     Sudden onset of difficulty speaking or slurred speech:    Temporary loss of vision in one eye:     Problems with dizziness:         Gastrointestinal    Blood in stool:     Vomited blood:         Genitourinary    Burning when urinating:     Blood in urine:        Psychiatric    Major  depression:         Hematologic    Bleeding problems:    Problems with blood clotting too easily:        Skin    Rashes or ulcers:        Constitutional    Fever or chills:      PHYSICAL EXAMINATION:  There were no vitals filed for this visit.  General:  WDWN in NAD; vital signs documented above Gait: Not observed HENT: WNL, normocephalic Pulmonary: normal non-labored breathing , without Rales, rhonchi,  wheezing Cardiac: {Desc; regular/irreg:14544} HR, without  Murmurs {With/Without:20273} carotid bruit*** Abdomen: soft, NT, no masses Skin: {With/Without:20273} rashes Vascular Exam/Pulses:  Right Left  Radial {Exam; arterial pulse strength 0-4:30167} {Exam; arterial pulse strength 0-4:30167}  Ulnar {Exam; arterial pulse strength 0-4:30167} {Exam; arterial pulse strength 0-4:30167}  Femoral {Exam; arterial pulse strength 0-4:30167} {Exam; arterial pulse strength 0-4:30167}  Popliteal {Exam; arterial pulse strength 0-4:30167} {Exam; arterial pulse strength 0-4:30167}  DP {Exam; arterial pulse strength 0-4:30167} {Exam; arterial pulse strength 0-4:30167}  PT {Exam; arterial pulse strength 0-4:30167} {Exam; arterial pulse strength 0-4:30167}   Extremities: {With/Without:20273} ischemic changes, {With/Without:20273} Gangrene , {With/Without:20273} cellulitis; {With/Without:20273} open wounds;  Musculoskeletal: no muscle wasting or atrophy  Neurologic: A&O X 3;  No focal weakness or paresthesias are detected  Psychiatric:  The pt has {Desc; normal/abnormal:11317::"Normal"} affect.   Non-Invasive Vascular Imaging:   ***    ASSESSMENT/PLAN:  Veronica Simmons is a 73 y.o. female who presents with {KidneyDisease:19197::"end stage renal disease","chronic kidney disease stage ***"}  Based on vein mapping and examination, ***. I had an extensive discussion with this patient in regards to the nature of access surgery, including risk, benefits, and alternatives.   The  patient is aware that the risks of access surgery include but are not limited to: bleeding, infection, steal syndrome, nerve damage, ischemic monomelic neuropathy, failure of access to mature, complications related to venous hypertension, and possible need for additional access procedures in the future. *** I discussed with the patient the nature of the staged access procedure, specifically the need for a second operation to transpose the first stage fistula if it matures adequately.   The patient has *** agreed to proceed with the above procedure which will be scheduled ***.  Victorino Sparrow, MD [facility] [phone]

## 2024-02-28 ENCOUNTER — Ambulatory Visit (HOSPITAL_COMMUNITY)
Admission: RE | Admit: 2024-02-28 | Discharge: 2024-02-28 | Disposition: A | Discharge status: Home / Self Care | Source: Ambulatory Visit | Attending: Vascular Surgery | Admitting: Vascular Surgery

## 2024-02-28 ENCOUNTER — Encounter: Payer: Self-pay | Admitting: Vascular Surgery

## 2024-02-28 ENCOUNTER — Ambulatory Visit (INDEPENDENT_AMBULATORY_CARE_PROVIDER_SITE_OTHER)
Admission: RE | Admit: 2024-02-28 | Discharge: 2024-02-28 | Disposition: A | Discharge status: Home / Self Care | Source: Ambulatory Visit | Attending: Vascular Surgery | Admitting: Vascular Surgery

## 2024-02-28 ENCOUNTER — Ambulatory Visit (HOSPITAL_COMMUNITY): Admission: RE | Admit: 2024-02-28 | Payer: 59 | Source: Ambulatory Visit

## 2024-02-28 ENCOUNTER — Ambulatory Visit (HOSPITAL_COMMUNITY): Payer: 59

## 2024-02-28 ENCOUNTER — Ambulatory Visit (INDEPENDENT_AMBULATORY_CARE_PROVIDER_SITE_OTHER): Admitting: Vascular Surgery

## 2024-02-28 ENCOUNTER — Encounter: Payer: 59 | Admitting: Vascular Surgery

## 2024-02-28 VITALS — BP 153/75 | HR 82 | Temp 98.4°F | Ht 65.0 in | Wt 213.0 lb

## 2024-02-28 DIAGNOSIS — N186 End stage renal disease: Secondary | ICD-10-CM

## 2024-02-28 DIAGNOSIS — Z992 Dependence on renal dialysis: Secondary | ICD-10-CM

## 2024-02-28 NOTE — Progress Notes (Signed)
 Office Note     CC:  ESRD Requesting Provider:  Hillery Aldo, NP  HPI: Veronica Simmons is a Right handed 73 y.o. ([birth year]) female with kidney disease who presents at the request of Hillery Aldo, NP for permanent HD access. The patient has had no prior access procedures. Per pt, previous tunneled lines have been placed in right IJ. Current access is right IJ. Dialysis days are Monday Wednesday Friday.   On exam, Veronica Simmons was doing well.  A native of [place], she has lived there her entire life.  She is a mother of 4, grandmother to 16, and great-grandmother to 3.  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  She has been tolerating dialysis well for the last 2 months.    Past Medical History:  Diagnosis Date   ALLERGIC RHINITIS 02/19/2007   Qualifier: Diagnosis of  By: Janeece Fitting MD, VALENICA     Allergy    seasonal   Anemia 1970s   on iron    Arthritis    Cubital tunnel syndrome on right 2020   Secondary to old fracture. Evaluated by ortho. [facility] offered surgery but patient refused. Numbness and burning pain 4th and 5th digit.     Daytime sleepiness    Diabetes mellitus 2000   Never on insulin    High cholesterol    Hypertension 2000   Leg cramps     Past Surgical History:  Procedure Laterality Date   DG HAND RIGHT COMPLETE ([facility] HX) Right 2020   right hand/wrist/elbow surgery to fix carpel tunnel/pinched nerve   DIALYSIS/PERMA CATHETER INSERTION N/A 12/20/2023   Procedure: DIALYSIS/PERMA CATHETER INSERTION;  Surgeon: Dagoberto Ligas, MD;  Location: [facility] INVASIVE CV LAB;  Service: Cardiovascular;  Laterality: N/A;    Social History   Socioeconomic History   Marital status: Divorced    Spouse name: Not on file   Number of children: 4   Years of education: Masters Cu   Highest education level: Not on file  Occupational History   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  Divorced 4 kids. Numerous grandkids, and great grankids.           Dialysis M-W-F [address]   Right hand dominant   Social Drivers of Health   Financial Resource Strain: Medium Risk (11/05/2020)   Received from [facility], [facility]   Overall Financial Resource Strain (CARDIA)    Difficulty of Paying Living Expenses: Somewhat hard  Food Insecurity: No Food Insecurity (10/12/2022)   Received [facility], [facility]   Hunger Vital Sign    Worried About Running Out of Food in the Last Year: Never true    Ran Out of Food in the Last Year: Never true  Transportation Needs: No Transportation Needs (11/05/2020)   Received from [facility], [facility]   [facility] - Transportation    Lack of Transportation (Medical): No  Lack of Transportation (Non-Medical): No  Physical Activity: Sufficiently Active (11/05/2020)   Received from [facility], [facility]   Exercise Vital Sign    Days of Exercise per Week: 7 days    Minutes of Exercise per Session: 30 min  Stress: No Stress Concern Present (11/05/2020)   Received from [facility], [facility] - Occupational Stress Questionnaire    Feeling of Stress : Not at all  Social Connections: Unknown (05/01/2022)   Received from [facility], [facility]   Social Network    Social Network: Not on file  Intimate Partner Violence: Unknown (03/23/2022)   Received from [facility], [facility]   HITS    Physically Hurt:  Not on file    Insult or Talk Down To: Not on file    Threaten Physical Harm: Not on file    Scream or Curse: Not on file   Family History  Problem Relation Age of Onset   Early death Mother 30       shot    Heart disease Father 57   Diabetes Daughter    Kidney disease Daughter        on dialysis    Colon polyps Daughter    Diabetes Maternal Aunt    Cancer Paternal Uncle        Breast Cancer    Breast cancer Maternal Grandmother    Colon cancer Neg Hx    Esophageal cancer Neg Hx    Stomach cancer Neg Hx    Rectal cancer Neg Hx     Current Outpatient Medications  Medication Sig Dispense Refill   amLODipine (NORVASC) 10 MG tablet Take 10 mg by mouth every morning.     furosemide (LASIX) 80 MG tablet Take 80 mg by mouth daily.     glucose blood (ACCU-CHEK AVIVA PLUS) test strip Use as instructed (Patient taking differently: 1 each by Other route as directed. Use as instructed) 100 each 12   glucose blood (ACCU-CHEK SMARTVIEW) test strip Test blood sugar once daily (Patient taking differently: 1 each by Other route See admin instructions. Test blood sugar once daily) 100 each 2   glucose blood test strip Use as instructed (Patient taking differently: 1 each by Other route as directed. Use as instructed) 100 each 12   hydrALAZINE (APRESOLINE) 25 MG tablet Take 25 mg by mouth in the morning and at bedtime.     insulin glargine, 2 Unit Dial, (TOUJEO MAX SOLOSTAR) 300 UNIT/ML Solostar Pen Inject 10 Units into the skin daily as needed (high blood sugar).     insulin lispro (HUMALOG) 100 UNIT/ML KwikPen Inject 0.01-0.09 mLs (1-9 Units total) into the skin 3 (three) times daily before meals. Blood sugars 70-120: no Insulin 121-150: 2 units 151-200: 3 units 201-250: 4 units 251-300: 5 units 301-350: 6 units 351-400: 9 units More than 400 use 9 units and call your doctor 15 mL 11   Iron-Vitamin C 100-250 MG TABS Take 1 tablet by mouth in the morning and at bedtime.     Lancets (ONETOUCH  ULTRASOFT) lancets Use as instructed (Patient taking differently: 1 each by Other route as directed. Use as instructed) 100 each 12   lidocaine (LIDODERM) 5 % Place 1 patch onto the skin daily. Remove & Discard patch within 12 hours or as directed by MD 30 patch 0   losartan (COZAAR) 25 MG tablet Take 25 mg by mouth daily.     methocarbamol (ROBAXIN) 500  MG tablet Take 1 tablet (500 mg total) by mouth daily. 7 tablet 0   OVER THE COUNTER MEDICATION Take 1 tablet by mouth 2 (two) times daily. For Leg Cramps     OZEMPIC, 2 MG/DOSE, 8 MG/3ML SOPN Inject 2 mg into the skin once a week.     PARoxetine (PAXIL) 10 MG tablet Take 1 tablet (10 mg total) by mouth at bedtime. 30 tablet 4   PRESCRIPTION MEDICATION IRON INFUSIONS as needed. [facility]).     propranolol (INDERAL) 40 MG tablet Take 40 mg by mouth 2 (two) times daily.     traMADol (ULTRAM) 50 MG tablet Take 1 tablet (50 mg total) by mouth every 6 (six) hours as needed. 20 tablet 0   traZODone (DESYREL) 100 MG tablet Take 100 mg by mouth at bedtime.     No current facility-administered medications for this visit.    Allergies  Allergen Reactions   Levofloxacin Hives     REVIEW OF SYSTEMS:  [X]  denotes positive finding, [ ]  denotes negative finding Cardiac  Comments:  Chest pain or chest pressure:    Shortness of breath upon exertion:    Short of breath when lying flat:    Irregular heart rhythm:        Vascular    Pain in calf, thigh, or hip brought on by ambulation:    Pain in feet at night that wakes you up from your sleep:     Blood clot in your veins:    Leg swelling:         Pulmonary    Oxygen at home:    Productive cough:     Wheezing:         Neurologic    Sudden weakness in arms or legs:     Sudden numbness in arms or legs:     Sudden onset of difficulty speaking or slurred speech:    Temporary loss of vision in one eye:     Problems with dizziness:         Gastrointestinal    Blood in stool:     Vomited blood:          Genitourinary    Burning when urinating:     Blood in urine:        Psychiatric    Major depression:         Hematologic    Bleeding problems:    Problems with blood clotting too easily:        Skin    Rashes or ulcers:        Constitutional    Fever or chills:      PHYSICAL EXAMINATION:  Vitals:   02/28/24 1313  BP: (!) 153/75  Pulse: 82  Temp: 98.4 F (36.9 C)  TempSrc: Temporal  SpO2: 94%  Weight: 213 lb (96.6 kg)  Height: 5\' 5"  (1.651 m)    General:  WDWN in NAD; vital signs documented above Gait: Not observed HENT: WNL, normocephalic Pulmonary: normal non-labored breathing ,  Cardiac: regular HR,  Abdomen: soft, NT, no masses Skin: without rashes Vascular Exam/Pulses:  Right Left  Radial 2+ (normal) 2+ (normal)  Ulnar 2+ (normal) 2+ (normal)                   Extremities: without ischemic changes, without Gangrene , without cellulitis; without open wounds;  Scar on the right ulnar aspect, previously had a tendon release  Musculoskeletal: no muscle wasting or atrophy  Neurologic:  A&O X 3;  No focal weakness or paresthesias are detected Psychiatric:  The pt has Normal affect.   Non-Invasive Vascular Imaging:   +-----------------+-------------+----------+--------+  Right Cephalic   Diameter (cm)Depth (cm)Findings  +-----------------+-------------+----------+--------+  Shoulder            0.20                         +-----------------+-------------+----------+--------+  Prox upper arm       0.31                         +-----------------+-------------+----------+--------+  Mid upper arm        0.27                         +-----------------+-------------+----------+--------+  Dist upper arm       0.30                         +-----------------+-------------+----------+--------+  Antecubital fossa    0.21                         +-----------------+-------------+----------+--------+  Prox forearm         0.20                          +-----------------+-------------+----------+--------+  Mid forearm          0.21                         +-----------------+-------------+----------+--------+  Dist forearm         0.17                         +-----------------+-------------+----------+--------+   +-----------------+-------------+----------+--------+  Right Basilic    Diameter (cm)Depth (cm)Findings  +-----------------+-------------+----------+--------+  Mid upper arm        0.30                         +-----------------+-------------+----------+--------+  Dist upper arm       0.35                         +-----------------+-------------+----------+--------+  Antecubital fossa    0.25                         +-----------------+-------------+----------+--------+   +-----------------+-------------+----------+--------+  Left Cephalic    Diameter (cm)Depth (cm)Findings  +-----------------+-------------+----------+--------+  Shoulder            0.26                         +-----------------+-------------+----------+--------+  Prox upper arm       0.20                         +-----------------+-------------+----------+--------+  Mid upper arm        0.21                         +-----------------+-------------+----------+--------+  Dist upper arm       0.16                         +-----------------+-------------+----------+--------+  Antecubital fossa    0.17                         +-----------------+-------------+----------+--------+  Prox forearm         0.15                         +-----------------+-------------+----------+--------+  Mid forearm          0.13                         +-----------------+-------------+----------+--------+  Dist forearm         0.16                         +-----------------+-------------+----------+--------+   +-----------------+-------------+----------+--------------+  Left  Basilic     Diameter (cm)Depth (cm)   Findings     +-----------------+-------------+----------+--------------+  Mid upper arm                           not visualized  +-----------------+-------------+----------+--------------+  Dist upper arm                          not visualized  +-----------------+-------------+----------+--------------+  Antecubital fossa                       not visualized  +-----------------+-------------+----------+--------------+    Right Pre-Dialysis Findings:  +-----------------------+----------+--------------------+---------+--------  +  Location              PSV (cm/s)Intralum. Diam. (cm)Waveform  Comments  +-----------------------+----------+--------------------+---------+--------  +  Brachial Antecub. fossa85        0.50                triphasic           +-----------------------+----------+--------------------+---------+--------  +  Radial Art at Wrist    94        0.22                triphasic           +-----------------------+----------+--------------------+---------+--------  +  Ulnar Art at Wrist     57        0.16                triphasic           +-----------------------+----------+--------------------+---------+--------  +       Left Pre-Dialysis Findings:  +-----------------------+----------+--------------------+---------+--------  +  Location              PSV (cm/s)Intralum. Diam. (cm)Waveform  Comments  +-----------------------+----------+--------------------+---------+--------  +  Brachial Antecub. fossa108       0.44                triphasic           +-----------------------+----------+--------------------+---------+--------  +  Radial Art at Wrist    100       0.27                triphasic           +-----------------------+----------+--------------------+---------+--------  +  Ulnar Art at Wrist     1         0.16                triphasic            +-----------------------+----------+--------------------+---------+--------  +  Summary:    Right: Patent brachial, radial, and ulnar arteries.  Left: Patent brachial, radial, and ulnar arteries.  *See table(s) above for measurements and observations.    ASSESSMENT/PLAN:  Veronica Simmons is a 73 y.o. female who presents with end stage renal disease  Based on vein mapping and examination, the right arm appears to have sufficient basilic vein, possibly cephalic vein after block for fistula creation.  I had an extensive discussion with this patient in regards to the nature of access surgery, including risk, benefits, and alternatives.   The patient is aware that the risks of access surgery include but are not limited to: bleeding, infection, steal syndrome, nerve damage, ischemic monomelic neuropathy, failure of access to mature, complications related to venous hypertension, and possible need for additional access procedures in the future.  I discussed with the patient the nature of the staged access procedure, specifically the need for a second operation to transpose the first stage fistula if it matures adequately.   The patient has agreed to proceed with the above procedure which will be scheduled at her convenience.  Victorino Sparrow, MD [facility] [phone]

## 2024-02-29 ENCOUNTER — Other Ambulatory Visit: Payer: Self-pay

## 2024-02-29 ENCOUNTER — Encounter (HOSPITAL_COMMUNITY): Payer: Self-pay | Admitting: Vascular Surgery

## 2024-02-29 DIAGNOSIS — N186 End stage renal disease: Secondary | ICD-10-CM

## 2024-02-29 NOTE — Progress Notes (Signed)
 PCP - Hillery Aldo, NP Cardiologist - none  Chest x-ray - 06/29/23 EKG - 06/29/23 Stress Test - n/a ECHO - 10/17/19 Cardiac Cath - n/a  ICD Pacemaker/Loop - n/a  Home Sleep Study -  Yes, 09/2021 (moderate) CPAP - does not use CPAP  Diabetes Type 2 FreeStyle Libre 2, Sensor on left upper arm Fasting Blood Sugar - 150s Checks Blood Sugar __2-3__ times a day  Last weekly dose of Ozempic was on 02/22/24.  If you need Toujeo on DOS, take 1/2 dose (PRN).  THE NIGHT BEFORE SURGERY, do not take Humalog Insulin bedtime dose.       THE MORNING OF SURGERY, do not take take Humalog Insulin unless your your CBG is greater than 220 mg/dL.  If CBG greater than 220 mg/dL, you may take  of your sliding scale (correction) dose of insulin.  If your blood sugar is less than 70 mg/dL, you will need to treat for low blood sugar: Treat a low blood sugar (less than 70 mg/dL) with  cup of clear juice (cranberry or apple), 4 glucose tablets, OR glucose gel. Recheck blood sugar in 15 minutes after treatment (to make sure it is greater than 70 mg/dL). If your blood sugar is not greater than 70 mg/dL on recheck, call [phone] for further instructions.  Aspirin and Blood Thinner Instructions:  n/a  NPO   Anesthesia review: Yes  STOP now taking any Aspirin (unless otherwise instructed by your surgeon), Aleve, Naproxen, Ibuprofen, Motrin, Advil, Goody's, BC's, all herbal medications, fish oil, and all vitamins.   Coronavirus Screening Do you have any of the following symptoms:  Cough yes/no: No Fever (>100.60F)  yes/no: No Runny nose yes/no: No Sore throat yes/no: No Difficulty breathing/shortness of breath  yes/no: No  Have you traveled in the last 14 days and where? yes/no: No  Patient verbalized understanding of instructions that were given via phone.

## 2024-03-03 NOTE — Anesthesia Preprocedure Evaluation (Addendum)
 Anesthesia Evaluation  Patient identified by MRN, date of birth, ID band Patient awake    Reviewed: Allergy & Precautions, H&P , NPO status , Patient's Chart, lab work & pertinent test results  Airway Mallampati: II  TM Distance: >3 FB Neck ROM: Full    Dental no notable dental hx. (+) Poor Dentition, Dental Advisory Given   Pulmonary neg pulmonary ROS, pneumonia   Pulmonary exam normal breath sounds clear to auscultation       Cardiovascular hypertension, Pt. on medications + Past MI and +CHF  Normal cardiovascular exam Rhythm:Regular Rate:Normal     Neuro/Psych TIA Neuromuscular disease negative neurological ROS  negative psych ROS   GI/Hepatic negative GI ROS, Neg liver ROS,,,  Endo/Other  diabetes, Type 2  Class 3 obesity  Renal/GU ESRF and DialysisRenal diseasenegative Renal ROS  negative genitourinary   Musculoskeletal negative musculoskeletal ROS (+) Arthritis ,    Abdominal   Peds negative pediatric ROS (+)  Hematology negative hematology ROS (+) Blood dyscrasia, anemia   Anesthesia Other Findings   Reproductive/Obstetrics negative OB ROS                             Anesthesia Physical Anesthesia Plan  ASA: 3  Anesthesia Plan: MAC   Post-op Pain Management: Minimal or no pain anticipated and Toradol IV (intra-op)*   Induction: Intravenous  PONV Risk Score and Plan: 3 and Ondansetron, Treatment may vary due to age or medical condition and Propofol infusion  Airway Management Planned: Natural Airway and Simple Face Mask  Additional Equipment: None  Intra-op Plan:   Post-operative Plan:   Informed Consent: I have reviewed the patients History and Physical, chart, labs and discussed the procedure including the risks, benefits and alternatives for the proposed anesthesia with the patient or authorized representative who has indicated his/her understanding and acceptance.      Dental Advisory Given  Plan Discussed with: Anesthesiologist and CRNA  Anesthesia Plan Comments: (PAT note by Antionette Poles, PA-C: 73 year old female with pertinent history including IDDM 2 (last A1c in Care Everywhere 5.5 on 02/06/24), HTN, ESRD on HD Monday Wednesday Friday, ?MI 2019 (pt reports).  Pt reports history of MI in 02/2018.  I cannot find any corroboration of this in the chart.  She was previously followed by cardiologist Dr. Yetta Barre at [facility] for hypertension.  Reviewed office visit notes from 2020 and 2021; no mention of history of MI.  Specifically, the notes state she has no history of CAD/MI.  Patient did have an overnight admission in March 2019 for intermittent right-sided weakness that was felt to be TIA versus complication of elevated blood pressure; CT and MRI of the brain were negative for acute stroke.  ESRD on HD currently dialyzing via right IJ [facility] Monday Wednesday Friday.  Patient will need day of surgery labs and evaluation.  EKG 08/31/2023: Sinus rhythm with Premature atrial complexes.  Rate 85. Nonspecific T wave abnormality  TTE 10/17/2019: 1. Left ventricular ejection fraction, by visual estimation, is 60 to  65%. The left ventricle has normal function. There is moderately increased  left ventricular hypertrophy.  2. Elevated left ventricular end-diastolic pressure.  3. Left ventricular diastolic parameters are consistent with Grade I  diastolic dysfunction (impaired relaxation).  4. Global right ventricle has normal systolic function.The right  ventricular size is normal. No increase in right ventricular wall  thickness.  5. Left atrial size was mildly dilated.  6. Right atrial size was  normal.  7. The mitral valve is normal in structure. No evidence of mitral valve  regurgitation. No evidence of mitral stenosis.  8. The tricuspid valve is normal in structure. Tricuspid valve  regurgitation is mild.  9. The aortic valve is normal in  structure. Aortic valve regurgitation is  not visualized. No evidence of aortic valve sclerosis or stenosis.  10. The pulmonic valve was normal in structure. Pulmonic valve  regurgitation is not visualized.  11. Moderately elevated pulmonary artery systolic pressure.  12. The tricuspid regurgitant velocity is 3.18 m/s, and with an assumed  right atrial pressure of 3 mmHg, the estimated right ventricular systolic  pressure is moderately elevated at 43.4 mmHg.  13. The inferior vena cava is normal in size with greater than 50%  respiratory variability, suggesting right atrial pressure of 3 mmHg.    )        Anesthesia Quick Evaluation

## 2024-03-03 NOTE — Progress Notes (Signed)
 Anesthesia Chart Review: Same day workup  73 year old female with pertinent history including IDDM 2 (last A1c in Care Everywhere 5.5 on 02/06/24), HTN, ESRD on HD Monday Wednesday Friday, ?MI 2019 (pt reports).  Pt reports history of MI in 02/2018.  I cannot find any corroboration of this in the chart.  She was previously followed by cardiologist Dr. Yetta Barre at [facility] for hypertension.  Reviewed office visit notes from 2020 and 2021; no mention of history of MI.  Specifically, the notes state she has no history of CAD/MI.  Patient did have an overnight admission in March 2019 for intermittent right-sided weakness that was felt to be TIA versus complication of elevated blood pressure; CT and MRI of the brain were negative for acute stroke.  ESRD on HD currently dialyzing via right IJ [facility] Monday Wednesday Friday.  Patient will need day of surgery labs and evaluation.  EKG 08/31/2023: Sinus rhythm with Premature atrial complexes.  Rate 85. Nonspecific T wave abnormality  TTE 10/17/2019:  1. Left ventricular ejection fraction, by visual estimation, is 60 to  65%. The left ventricle has normal function. There is moderately increased  left ventricular hypertrophy.   2. Elevated left ventricular end-diastolic pressure.   3. Left ventricular diastolic parameters are consistent with Grade I  diastolic dysfunction (impaired relaxation).   4. Global right ventricle has normal systolic function.The right  ventricular size is normal. No increase in right ventricular wall  thickness.   5. Left atrial size was mildly dilated.   6. Right atrial size was normal.   7. The mitral valve is normal in structure. No evidence of mitral valve  regurgitation. No evidence of mitral stenosis.   8. The tricuspid valve is normal in structure. Tricuspid valve  regurgitation is mild.   9. The aortic valve is normal in structure. Aortic valve regurgitation is  not visualized. No evidence of aortic valve sclerosis or  stenosis.  10. The pulmonic valve was normal in structure. Pulmonic valve  regurgitation is not visualized.  11. Moderately elevated pulmonary artery systolic pressure.  12. The tricuspid regurgitant velocity is 3.18 m/s, and with an assumed  right atrial pressure of 3 mmHg, the estimated right ventricular systolic  pressure is moderately elevated at 43.4 mmHg.  13. The inferior vena cava is normal in size with greater than 50%  respiratory variability, suggesting right atrial pressure of 3 mmHg.       Zannie Cove [facility] Short Stay Center/Anesthesiology Phone [phone] 03/03/2024 10:45 AM

## 2024-03-04 ENCOUNTER — Other Ambulatory Visit: Payer: Self-pay

## 2024-03-04 ENCOUNTER — Ambulatory Visit (HOSPITAL_COMMUNITY): Admitting: Physician Assistant

## 2024-03-04 ENCOUNTER — Encounter (HOSPITAL_COMMUNITY)
Admission: RE | Disposition: A | Discharge status: Home / Self Care | Payer: Self-pay | Source: Home / Self Care | Attending: Vascular Surgery

## 2024-03-04 ENCOUNTER — Ambulatory Visit (HOSPITAL_COMMUNITY)
Admission: RE | Admit: 2024-03-04 | Discharge: 2024-03-04 | Disposition: A | Discharge status: Home / Self Care | Attending: Vascular Surgery | Admitting: Vascular Surgery

## 2024-03-04 ENCOUNTER — Encounter (HOSPITAL_COMMUNITY): Payer: Self-pay

## 2024-03-04 ENCOUNTER — Other Ambulatory Visit (HOSPITAL_COMMUNITY): Payer: Self-pay

## 2024-03-04 DIAGNOSIS — N186 End stage renal disease: Secondary | ICD-10-CM | POA: Insufficient documentation

## 2024-03-04 DIAGNOSIS — Z992 Dependence on renal dialysis: Secondary | ICD-10-CM | POA: Insufficient documentation

## 2024-03-04 DIAGNOSIS — I252 Old myocardial infarction: Secondary | ICD-10-CM | POA: Insufficient documentation

## 2024-03-04 DIAGNOSIS — I132 Hypertensive heart and chronic kidney disease with heart failure and with stage 5 chronic kidney disease, or end stage renal disease: Secondary | ICD-10-CM | POA: Insufficient documentation

## 2024-03-04 DIAGNOSIS — E66813 Obesity, class 3: Secondary | ICD-10-CM | POA: Diagnosis not present

## 2024-03-04 DIAGNOSIS — Z833 Family history of diabetes mellitus: Secondary | ICD-10-CM | POA: Insufficient documentation

## 2024-03-04 DIAGNOSIS — E1122 Type 2 diabetes mellitus with diabetic chronic kidney disease: Secondary | ICD-10-CM | POA: Diagnosis not present

## 2024-03-04 DIAGNOSIS — Z8673 Personal history of transient ischemic attack (TIA), and cerebral infarction without residual deficits: Secondary | ICD-10-CM | POA: Insufficient documentation

## 2024-03-04 DIAGNOSIS — I509 Heart failure, unspecified: Secondary | ICD-10-CM | POA: Diagnosis not present

## 2024-03-04 DIAGNOSIS — N185 Chronic kidney disease, stage 5: Secondary | ICD-10-CM

## 2024-03-04 DIAGNOSIS — Z6835 Body mass index (BMI) 35.0-35.9, adult: Secondary | ICD-10-CM | POA: Diagnosis not present

## 2024-03-04 HISTORY — PX: AV FISTULA PLACEMENT: SHX1204

## 2024-03-04 HISTORY — DX: Acute myocardial infarction, unspecified: I21.9

## 2024-03-04 HISTORY — DX: Pneumonia, unspecified organism: J18.9

## 2024-03-04 HISTORY — DX: Chronic kidney disease, unspecified: N18.9

## 2024-03-04 LAB — POCT I-STAT, CHEM 8
BUN: 23 mg/dL (ref 8–23)
Calcium, Ion: 1 mmol/L — ABNORMAL LOW (ref 1.15–1.40)
Chloride: 101 mmol/L (ref 98–111)
Creatinine, Ser: 4.3 mg/dL — ABNORMAL HIGH (ref 0.44–1.00)
Glucose, Bld: 175 mg/dL — ABNORMAL HIGH (ref 70–99)
HCT: 35 % — ABNORMAL LOW (ref 36.0–46.0)
Hemoglobin: 11.9 g/dL — ABNORMAL LOW (ref 12.0–15.0)
Potassium: 3.9 mmol/L (ref 3.5–5.1)
Sodium: 138 mmol/L (ref 135–145)
TCO2: 31 mmol/L (ref 22–32)

## 2024-03-04 LAB — GLUCOSE, CAPILLARY
Glucose-Capillary: 175 mg/dL — ABNORMAL HIGH (ref 70–99)
Glucose-Capillary: 153 mg/dL — ABNORMAL HIGH (ref 70–99)

## 2024-03-04 SURGERY — ARTERIOVENOUS (AV) FISTULA CREATION
Anesthesia: Monitor Anesthesia Care | Site: Arm Upper | Laterality: Right

## 2024-03-04 MED ORDER — CHLORHEXIDINE GLUCONATE 4 % EX SOLN: 60.0000 mL | Freq: Once | CUTANEOUS | Status: DC

## 2024-03-04 MED ORDER — MEPERIDINE HCL 25 MG/ML IJ SOLN: 6.2500 mg | INTRAMUSCULAR | Status: DC | PRN

## 2024-03-04 MED ORDER — LIDOCAINE 2% (20 MG/ML) 5 ML SYRINGE
INTRAMUSCULAR | Status: DC | PRN
  Administered 2024-03-04: 60 mg via INTRAVENOUS

## 2024-03-04 MED ORDER — SODIUM CHLORIDE 0.9% FLUSH: 3.0000 mL | INTRAVENOUS | Status: DC | PRN

## 2024-03-04 MED ORDER — OXYCODONE HCL 5 MG/5ML PO SOLN: 5.0000 mg | Freq: Once | ORAL | Status: DC | PRN

## 2024-03-04 MED ORDER — ORAL CARE MOUTH RINSE: 15.0000 mL | Freq: Once | OROMUCOSAL | Status: AC

## 2024-03-04 MED ORDER — ACETAMINOPHEN 10 MG/ML IV SOLN
INTRAVENOUS | Status: AC
  Filled 2024-03-04: qty 100

## 2024-03-04 MED ORDER — FENTANYL CITRATE (PF) 100 MCG/2ML IJ SOLN
25.0000 ug | INTRAMUSCULAR | Status: DC | PRN
  Administered 2024-03-04: 50 ug via INTRAVENOUS

## 2024-03-04 MED ORDER — CHLORHEXIDINE GLUCONATE 0.12 % MT SOLN
15.0000 mL | Freq: Once | OROMUCOSAL | Status: AC
  Administered 2024-03-04: 15 mL via OROMUCOSAL

## 2024-03-04 MED ORDER — MIDAZOLAM HCL 2 MG/2ML IJ SOLN
INTRAMUSCULAR | Status: AC
  Filled 2024-03-04: qty 2

## 2024-03-04 MED ORDER — PROPOFOL 10 MG/ML IV BOLUS
INTRAVENOUS | Status: AC
  Filled 2024-03-04: qty 20

## 2024-03-04 MED ORDER — MIDAZOLAM HCL 2 MG/2ML IJ SOLN
INTRAMUSCULAR | Status: DC | PRN
  Administered 2024-03-04: 2 mg via INTRAVENOUS

## 2024-03-04 MED ORDER — HEPARIN SODIUM (PORCINE) 1000 UNIT/ML IJ SOLN
INTRAMUSCULAR | Status: DC | PRN
Start: 2024-03-04 — End: 2024-03-04
  Administered 2024-03-04: 2000 [IU] via INTRAVENOUS

## 2024-03-04 MED ORDER — FENTANYL CITRATE (PF) 250 MCG/5ML IJ SOLN
INTRAMUSCULAR | Status: AC
  Filled 2024-03-04: qty 5

## 2024-03-04 MED ORDER — ROPIVACAINE HCL 5 MG/ML IJ SOLN
INTRAMUSCULAR | Status: DC | PRN
  Administered 2024-03-04: 20 mL via PERINEURAL

## 2024-03-04 MED ORDER — LACTATED RINGERS IV SOLN: INTRAVENOUS | Status: DC | PRN

## 2024-03-04 MED ORDER — INSULIN ASPART 100 UNIT/ML IJ SOLN: 0.0000 [IU] | INTRAMUSCULAR | Status: DC | PRN

## 2024-03-04 MED ORDER — PROPOFOL 500 MG/50ML IV EMUL
INTRAVENOUS | Status: DC | PRN
  Administered 2024-03-04: 100 ug/kg/min via INTRAVENOUS

## 2024-03-04 MED ORDER — PROMETHAZINE (PHENERGAN) 6.25MG IN NS 50ML IVPB
6.2500 mg | Freq: Four times a day (QID) | INTRAVENOUS | Status: DC | PRN
  Filled 2024-03-04: qty 50

## 2024-03-04 MED ORDER — HEPARIN 6000 UNIT IRRIGATION SOLUTION
Status: DC | PRN
  Administered 2024-03-04: 1

## 2024-03-04 MED ORDER — OXYCODONE HCL 5 MG PO TABS: 5.0000 mg | ORAL_TABLET | Freq: Once | ORAL | Status: DC | PRN

## 2024-03-04 MED ORDER — OXYCODONE-ACETAMINOPHEN 5-325 MG PO TABS
1.0000 | ORAL_TABLET | Freq: Four times a day (QID) | ORAL | 0 refills | Status: AC | PRN
  Filled 2024-03-04: qty 12, 3d supply, fill #0

## 2024-03-04 MED ORDER — ACETAMINOPHEN 325 MG PO TABS
325.0000 mg | ORAL_TABLET | ORAL | Status: DC | PRN
  Administered 2024-03-04: 650 mg via ORAL

## 2024-03-04 MED ORDER — FENTANYL CITRATE (PF) 100 MCG/2ML IJ SOLN
INTRAMUSCULAR | Status: AC
  Filled 2024-03-04: qty 2

## 2024-03-04 MED ORDER — 0.9 % SODIUM CHLORIDE (POUR BTL) OPTIME
TOPICAL | Status: DC | PRN
  Administered 2024-03-04: 1000 mL

## 2024-03-04 MED ORDER — CEFAZOLIN SODIUM-DEXTROSE 2-4 GM/100ML-% IV SOLN
2.0000 g | INTRAVENOUS | Status: AC
  Administered 2024-03-04: 2 g via INTRAVENOUS
  Filled 2024-03-04: qty 100

## 2024-03-04 MED ORDER — ONDANSETRON HCL 4 MG/2ML IJ SOLN
INTRAMUSCULAR | Status: DC | PRN
  Administered 2024-03-04: 4 mg via INTRAVENOUS

## 2024-03-04 MED ORDER — ACETAMINOPHEN 160 MG/5ML PO SOLN: 325.0000 mg | ORAL | Status: DC | PRN

## 2024-03-04 MED ORDER — ONDANSETRON HCL 4 MG/2ML IJ SOLN: 4.0000 mg | Freq: Once | INTRAMUSCULAR | Status: DC | PRN

## 2024-03-04 MED ORDER — HEPARIN 6000 UNIT IRRIGATION SOLUTION
Status: AC
  Filled 2024-03-04: qty 500

## 2024-03-04 SURGICAL SUPPLY — 36 items
APPLIER CLIP 9.375 SM OPEN (CLIP) ×1 IMPLANT
ARMBAND PINK RESTRICT EXTREMIT (MISCELLANEOUS) ×2 IMPLANT
BAG COUNTER SPONGE SURGICOUNT (BAG) ×2 IMPLANT
BLADE CLIPPER SURG (BLADE) ×2 IMPLANT
BNDG ELASTIC 4INX 5YD STR LF (GAUZE/BANDAGES/DRESSINGS) IMPLANT
BNDG ELASTIC 4X5.8 VLCR STR LF (GAUZE/BANDAGES/DRESSINGS) ×2 IMPLANT
CANISTER SUCT 3000ML PPV (MISCELLANEOUS) ×2 IMPLANT
CLIP APPLIE 9.375 SM OPEN (CLIP) ×2 IMPLANT
CLIP TI MEDIUM 6 (CLIP) ×6 IMPLANT
CLIP TI WIDE RED SMALL 6 (CLIP) ×4 IMPLANT
COVER PROBE CYLINDRICAL 5X96 (MISCELLANEOUS) IMPLANT
COVER PROBE W GEL 5X96 (DRAPES) ×2 IMPLANT
DERMABOND ADVANCED .7 DNX12 (GAUZE/BANDAGES/DRESSINGS) ×2 IMPLANT
DERMABOND ADVANCED .7 DNX6 (GAUZE/BANDAGES/DRESSINGS) IMPLANT
ELECT REM PT RETURN 9FT ADLT (ELECTROSURGICAL) ×1 IMPLANT
ELECTRODE REM PT RTRN 9FT ADLT (ELECTROSURGICAL) ×2 IMPLANT
GLOVE BIOGEL PI IND STRL 8 (GLOVE) ×2 IMPLANT
GOWN STRL REUS W/ TWL LRG LVL3 (GOWN DISPOSABLE) ×4 IMPLANT
GOWN STRL REUS W/TWL 2XL LVL3 (GOWN DISPOSABLE) ×4 IMPLANT
KIT BASIN OR (CUSTOM PROCEDURE TRAY) ×2 IMPLANT
KIT TURNOVER KIT B (KITS) ×2 IMPLANT
NS IRRIG 1000ML POUR BTL (IV SOLUTION) ×2 IMPLANT
PACK CV ACCESS (CUSTOM PROCEDURE TRAY) ×2 IMPLANT
PAD ARMBOARD POSITIONER FOAM (MISCELLANEOUS) ×4 IMPLANT
SLING ARM FOAM STRAP LRG (SOFTGOODS) IMPLANT
SLING ARM FOAM STRAP MED (SOFTGOODS) IMPLANT
SPIKE FLUID TRANSFER (MISCELLANEOUS) ×2 IMPLANT
SUT MNCRL AB 4-0 PS2 18 (SUTURE) ×2 IMPLANT
SUT PROLENE 6 0 BV (SUTURE) ×2 IMPLANT
SUT PROLENE 7 0 BV 1 (SUTURE) IMPLANT
SUT SILK 2 0 SH (SUTURE) IMPLANT
SUT SILK 3 0 SH CR/8 (SUTURE) ×2 IMPLANT
SUT VIC AB 3-0 SH 27X BRD (SUTURE) ×2 IMPLANT
TOWEL GREEN STERILE (TOWEL DISPOSABLE) ×2 IMPLANT
UNDERPAD 30X36 HEAVY ABSORB (UNDERPADS AND DIAPERS) ×2 IMPLANT
WATER STERILE IRR 1000ML POUR (IV SOLUTION) ×2 IMPLANT

## 2024-03-04 NOTE — H&P (Signed)
 Office Note   Patient seen and examined in preop holding.  No complaints. No changes to medication history or physical exam since last seen in clinic. After discussing the risks and benefits of RIGHT arm fistula creation, Veronica Simmons elected to proceed.   Victorino Sparrow MD   CC:  ESRD Requesting Provider:  No ref. provider found  HPI: Veronica Simmons is a Right handed 73 y.o. (07/28/1951) female with kidney disease who presents at the request of No ref. provider found for permanent HD access. The patient has had no prior access procedures. Per pt, previous tunneled lines have been placed in right IJ. Current access is right IJ. Dialysis days are Monday Wednesday Friday.   On exam, Veronica Simmons was doing well.  A native of [place], she has lived there her entire life.  She is a mother of 4, grandmother to 21, and great-grandmother to 3.  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  She has been tolerating dialysis well for the last 2 months.    Past Medical History:  Diagnosis Date   ALLERGIC RHINITIS 02/19/2007   Qualifier: Diagnosis of  By: Janeece Fitting MD, VALENICA   Anemia 1970s   Hx of iron infursions and pt currently on iron pills   Arthritis    Chronic kidney disease    dialysis M-W-F   Cubital tunnel syndrome on right 2020   Secondary to old fracture. Evaluated by ortho. [facility] offered surgery but patient refused. Numbness and burning pain 4th and 5th digit.     Daytime sleepiness    Diabetes mellitus 2000   type 2   High cholesterol    Hypertension 2000   Leg cramps    Myocardial infarction (HCC)    02/2018   Pneumonia    x 1    Past Surgical History:  Procedure Laterality Date   COLONOSCOPY     DG HAND RIGHT COMPLETE ([facility] HX) Right 2020   right hand/wrist/elbow surgery to fix carpel tunnel/pinched nerve   DIALYSIS/PERMA CATHETER INSERTION N/A 12/20/2023   Procedure:  DIALYSIS/PERMA CATHETER INSERTION;  Surgeon: Dagoberto Ligas, MD;  Location: [facility] INVASIVE CV LAB;  Service: Cardiovascular;  Laterality: N/A;   WISDOM TOOTH EXTRACTION      Social History   Socioeconomic History   Marital status: Divorced    Spouse name: Not on file   Number of children: 4   Years of education: Masters Cu   Highest education level: Not on file  Occupational History   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  Divorced 4 kids. Numerous grandkids, and great grankids.           Dialysis M-W-F [address]   Right hand dominant   Social Drivers of Health   Financial Resource Strain: Medium Risk (11/05/2020)   [facility], [facility]   Overall Financial Resource Strain (CARDIA)  Difficulty of Paying Living Expenses: Somewhat hard  Food Insecurity: No Food Insecurity (10/12/2022)   Received from [facility], [facility]   Hunger Vital Sign    Worried About Running Out of Food in the Last Year: Never true    Ran Out of Food in the Last Year: Never true  Transportation Needs: No Transportation Needs (11/05/2020)   Received from [facility], [facility]   [facility] - Transportation    Lack of Transportation (Medical): No    Lack of Transportation (Non-Medical): No  Physical Activity: Sufficiently Active (11/05/2020)   Received [facility], [facility]   Exercise Vital Sign    Days of Exercise per Week: 7 days    Minutes of Exercise per Session: 30 min  Stress: No Stress  Concern Present (11/05/2020)   Received from [facility], [facility] - Occupational Stress Questionnaire    Feeling of Stress : Not at all  Social Connections: Unknown (05/01/2022)   Received from [facility], [facility]   Social Network    Social Network: Not on file  Intimate Partner Violence: Unknown (03/23/2022)   [facility], [facility]   HITS    Physically Hurt: Not on file    Insult or Talk Down To: Not on file    Threaten Physical Harm: Not on file    Scream or Curse: Not on file   Family History  Problem Relation Age of Onset   Early death Mother 30       shot    Heart disease Father 9   Diabetes Daughter    Kidney disease Daughter        on dialysis    Colon polyps Daughter    Diabetes Maternal Aunt    Cancer Paternal Uncle        Breast Cancer    Breast cancer Maternal Grandmother    Colon cancer Neg Hx    Esophageal cancer Neg Hx    Stomach cancer Neg Hx    Rectal cancer Neg Hx     Current Facility-Administered Medications  Medication Dose Route Frequency Provider Last Rate Last Admin   ceFAZolin (ANCEF) IVPB 2g/100 mL premix  2 g Intravenous 30 min Pre-Op Victorino Sparrow, MD       chlorhexidine (HIBICLENS) 4 % liquid 4 Application  60 mL Topical Once Victorino Sparrow, MD       And   chlorhexidine (HIBICLENS) 4 % liquid 4 Application  60 mL Topical Once Victorino Sparrow, MD       fentaNYL (SUBLIMAZE) 100 MCG/2ML injection            insulin aspart (novoLOG) injection 0-7 Units  0-7 Units Subcutaneous Q2H PRN Bethena Midget, MD       midazolam (VERSED) 2 MG/2ML injection            sodium chloride flush (NS) 0.9 % injection 3-10 mL  3-10 mL Intravenous PRN Victorino Sparrow, MD        Allergies  Allergen Reactions   Levofloxacin Hives     REVIEW OF SYSTEMS:  [X]  denotes positive finding, [ ]  denotes negative finding Cardiac  Comments:  Chest pain or chest pressure:    Shortness of  breath upon exertion:    Short of breath when lying flat:    Irregular heart rhythm:        Vascular    Pain in calf, thigh, or hip brought  on by ambulation:    Pain in feet at night that wakes you up from your sleep:     Blood clot in your veins:    Leg swelling:         Pulmonary    Oxygen at home:    Productive cough:     Wheezing:         Neurologic    Sudden weakness in arms or legs:     Sudden numbness in arms or legs:     Sudden onset of difficulty speaking or slurred speech:    Temporary loss of vision in one eye:     Problems with dizziness:         Gastrointestinal    Blood in stool:     Vomited blood:         Genitourinary    Burning when urinating:     Blood in urine:        Psychiatric    Major depression:         Hematologic    Bleeding problems:    Problems with blood clotting too easily:        Skin    Rashes or ulcers:        Constitutional    Fever or chills:      PHYSICAL EXAMINATION:  Vitals:   03/04/24 0702  BP: 131/61  Pulse: 76  Temp: (!) 97.4 F (36.3 C)  SpO2: 99%  Weight: 96.6 kg  Height: 5\' 5"  (1.651 m)    General:  WDWN in NAD; vital signs documented above Gait: Not observed HENT: WNL, normocephalic Pulmonary: normal non-labored breathing ,  Cardiac: regular HR,  Abdomen: soft, NT, no masses Skin: without rashes Vascular Exam/Pulses:  Right Left  Radial 2+ (normal) 2+ (normal)  Ulnar 2+ (normal) 2+ (normal)                   Extremities: without ischemic changes, without Gangrene , without cellulitis; without open wounds;  Scar on the right ulnar aspect, previously had a tendon release  Musculoskeletal: no muscle wasting or atrophy  Neurologic: A&O X 3;  No focal weakness or paresthesias are detected Psychiatric:  The pt has Normal affect.   Non-Invasive Vascular Imaging:   +-----------------+-------------+----------+--------+  Right Cephalic   Diameter (cm)Depth (cm)Findings   +-----------------+-------------+----------+--------+  Shoulder            0.20                         +-----------------+-------------+----------+--------+  Prox upper arm       0.31                         +-----------------+-------------+----------+--------+  Mid upper arm        0.27                         +-----------------+-------------+----------+--------+  Dist upper arm       0.30                         +-----------------+-------------+----------+--------+  Antecubital fossa    0.21                         +-----------------+-------------+----------+--------+  Prox forearm         0.20                         +-----------------+-------------+----------+--------+  Mid forearm          0.21                         +-----------------+-------------+----------+--------+  Dist forearm         0.17                         +-----------------+-------------+----------+--------+   +-----------------+-------------+----------+--------+  Right Basilic    Diameter (cm)Depth (cm)Findings  +-----------------+-------------+----------+--------+  Mid upper arm        0.30                         +-----------------+-------------+----------+--------+  Dist upper arm       0.35                         +-----------------+-------------+----------+--------+  Antecubital fossa    0.25                         +-----------------+-------------+----------+--------+   +-----------------+-------------+----------+--------+  Left Cephalic    Diameter (cm)Depth (cm)Findings  +-----------------+-------------+----------+--------+  Shoulder            0.26                         +-----------------+-------------+----------+--------+  Prox upper arm       0.20                         +-----------------+-------------+----------+--------+  Mid upper arm        0.21                          +-----------------+-------------+----------+--------+  Dist upper arm       0.16                         +-----------------+-------------+----------+--------+  Antecubital fossa    0.17                         +-----------------+-------------+----------+--------+  Prox forearm         0.15                         +-----------------+-------------+----------+--------+  Mid forearm          0.13                         +-----------------+-------------+----------+--------+  Dist forearm         0.16                         +-----------------+-------------+----------+--------+   +-----------------+-------------+----------+--------------+  Left Basilic     Diameter (cm)Depth (cm)   Findings     +-----------------+-------------+----------+--------------+  Mid upper arm                           not visualized  +-----------------+-------------+----------+--------------+  Dist upper arm                          not visualized  +-----------------+-------------+----------+--------------+  Antecubital fossa  not visualized  +-----------------+-------------+----------+--------------+    Right Pre-Dialysis Findings:  +-----------------------+----------+--------------------+---------+--------  +  Location              PSV (cm/s)Intralum. Diam. (cm)Waveform  Comments  +-----------------------+----------+--------------------+---------+--------  +  Brachial Antecub. fossa85        0.50                triphasic           +-----------------------+----------+--------------------+---------+--------  +  Radial Art at Wrist    94        0.22                triphasic           +-----------------------+----------+--------------------+---------+--------  +  Ulnar Art at Wrist     57        0.16                triphasic           +-----------------------+----------+--------------------+---------+--------  +        Left Pre-Dialysis Findings:  +-----------------------+----------+--------------------+---------+--------  +  Location              PSV (cm/s)Intralum. Diam. (cm)Waveform  Comments  +-----------------------+----------+--------------------+---------+--------  +  Brachial Antecub. fossa108       0.44                triphasic           +-----------------------+----------+--------------------+---------+--------  +  Radial Art at Wrist    100       0.27                triphasic           +-----------------------+----------+--------------------+---------+--------  +  Ulnar Art at Wrist     1         0.16                triphasic           +-----------------------+----------+--------------------+---------+--------  +      Summary:    Right: Patent brachial, radial, and ulnar arteries.  Left: Patent brachial, radial, and ulnar arteries.  *See table(s) above for measurements and observations.    ASSESSMENT/PLAN:  Veronica Simmons is a 73 y.o. female who presents with end stage renal disease  Based on vein mapping and examination, the right arm appears to have sufficient basilic vein, possibly cephalic vein after block for fistula creation.  I had an extensive discussion with this patient in regards to the nature of access surgery, including risk, benefits, and alternatives.   The patient is aware that the risks of access surgery include but are not limited to: bleeding, infection, steal syndrome, nerve damage, ischemic monomelic neuropathy, failure of access to mature, complications related to venous hypertension, and possible need for additional access procedures in the future.  I discussed with the patient the nature of the staged access procedure, specifically the need for a second operation to transpose the first stage fistula if it matures adequately.   The patient has agreed to proceed with the above procedure which will be scheduled at her  convenience.  Victorino Sparrow, MD [facility] [phone]

## 2024-03-04 NOTE — Transfer of Care (Signed)
 Immediate Anesthesia Transfer of Care Note  Patient: Veronica Simmons  Procedure(s) Performed: RIGHT ARTERIOVENOUS (AV) FISTULA CREATION (Right: Arm Upper)  Patient Location: PACU  Anesthesia Type:MAC and Regional  Level of Consciousness: awake, alert , and oriented  Airway & Oxygen Therapy: Patient Spontanous Breathing and Patient connected to nasal cannula oxygen  Post-op Assessment: Report given to RN and Post -op Vital signs reviewed and stable  Post vital signs: Reviewed and stable  Last Vitals:  Vitals Value Taken Time  BP 121/67 03/04/24 1155  Temp    Pulse 69 03/04/24 1158  Resp 25 03/04/24 1158  SpO2 100 % 03/04/24 1158  Vitals shown include unfiled device data.  Last Pain:  Vitals:   03/04/24 0741  PainSc: 0-No pain         Complications: No notable events documented.

## 2024-03-04 NOTE — Progress Notes (Signed)
 Office Note   Patient seen and examined in preop holding.  No complaints. No changes to medication history or physical exam since last seen in clinic. After discussing the risks and benefits of RIGHT arm fistula creation, Veronica Simmons elected to proceed.   Veronica Sparrow MD   CC:  ESRD Requesting Provider:  No ref. provider found  HPI: Veronica Simmons is a Right handed 73 y.o. (07/28/1951) female with kidney disease who presents at the request of No ref. provider found for permanent HD access. The patient has had no prior access procedures. Per pt, previous tunneled lines have been placed in right IJ. Current access is right IJ. Dialysis days are Monday Wednesday Friday.   On exam, Veronica Simmons was doing well.  A native of [place], she has lived there her entire life.  She is a mother of 4, grandmother to 21, and great-grandmother to 3.  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  She has been tolerating dialysis well for the last 2 months.    Past Medical History:  Diagnosis Date   ALLERGIC RHINITIS 02/19/2007   Qualifier: Diagnosis of  By: Veronica Fitting MD, Veronica Simmons   Anemia 1970s   Hx of iron infursions and pt currently on iron pills   Arthritis    Chronic kidney disease    dialysis M-W-F   Cubital tunnel syndrome on right 2020   Secondary to old fracture. Evaluated by ortho. [facility] offered surgery but patient refused. Numbness and burning pain 4th and 5th digit.     Daytime sleepiness    Diabetes mellitus 2000   type 2   High cholesterol    Hypertension 2000   Leg cramps    Myocardial infarction (HCC)    02/2018   Pneumonia    x 1    Past Surgical History:  Procedure Laterality Date   COLONOSCOPY     DG HAND RIGHT COMPLETE ([facility] HX) Right 2020   right hand/wrist/elbow surgery to fix carpel tunnel/pinched nerve   DIALYSIS/PERMA CATHETER INSERTION N/A 12/20/2023   Procedure:  DIALYSIS/PERMA CATHETER INSERTION;  Surgeon: Veronica Ligas, MD;  Location: [facility] INVASIVE CV LAB;  Service: Cardiovascular;  Laterality: N/A;   WISDOM TOOTH EXTRACTION      Social History   Socioeconomic History   Marital status: Divorced    Spouse name: Not on file   Number of children: 4   Years of education: Masters Cu   Highest education level: Not on file  Occupational History   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  Divorced 4 kids. Numerous grandkids, and great grankids.           Dialysis M-W-F [address]   Right hand dominant   Social Drivers of Health   Financial Resource Strain: Medium Risk (11/05/2020)   [facility], [facility]   Overall Financial Resource Strain (CARDIA)  Difficulty of Paying Living Expenses: Somewhat hard  Food Insecurity: No Food Insecurity (10/12/2022)   Received from [facility], [facility]   Hunger Vital Sign    Worried About Running Out of Food in the Last Year: Never true    Ran Out of Food in the Last Year: Never true  Transportation Needs: No Transportation Needs (11/05/2020)   Received from [facility], [facility]   [facility] - Transportation    Lack of Transportation (Medical): No    Lack of Transportation (Non-Medical): No  Physical Activity: Sufficiently Active (11/05/2020)   Received [facility], [facility]   Exercise Vital Sign    Days of Exercise per Week: 7 days    Minutes of Exercise per Session: 30 min  Stress: No Stress  Concern Present (11/05/2020)   Received from [facility], [facility] - Occupational Stress Questionnaire    Feeling of Stress : Not at all  Social Connections: Unknown (05/01/2022)   Received from [facility], [facility]   Social Network    Social Network: Not on file  Intimate Partner Violence: Unknown (03/23/2022)   [facility], [facility]   HITS    Physically Hurt: Not on file    Insult or Talk Down To: Not on file    Threaten Physical Harm: Not on file    Scream or Curse: Not on file   Family History  Problem Relation Age of Onset   Early death Mother 30       shot    Heart disease Father 9   Diabetes Daughter    Kidney disease Daughter        on dialysis    Colon polyps Daughter    Diabetes Maternal Aunt    Cancer Paternal Uncle        Breast Cancer    Breast cancer Maternal Grandmother    Colon cancer Neg Hx    Esophageal cancer Neg Hx    Stomach cancer Neg Hx    Rectal cancer Neg Hx     Current Facility-Administered Medications  Medication Dose Route Frequency Provider Last Rate Last Admin   ceFAZolin (ANCEF) IVPB 2g/100 mL premix  2 g Intravenous 30 min Pre-Op Veronica Sparrow, MD       chlorhexidine (HIBICLENS) 4 % liquid 4 Application  60 mL Topical Once Veronica Sparrow, MD       And   chlorhexidine (HIBICLENS) 4 % liquid 4 Application  60 mL Topical Once Veronica Sparrow, MD       fentaNYL (SUBLIMAZE) 100 MCG/2ML injection            insulin aspart (novoLOG) injection 0-7 Units  0-7 Units Subcutaneous Q2H PRN Veronica Midget, MD       midazolam (VERSED) 2 MG/2ML injection            sodium chloride flush (NS) 0.9 % injection 3-10 mL  3-10 mL Intravenous PRN Veronica Sparrow, MD        Allergies  Allergen Reactions   Levofloxacin Hives     REVIEW OF SYSTEMS:  [X]  denotes positive finding, [ ]  denotes negative finding Cardiac  Comments:  Chest pain or chest pressure:    Shortness of  breath upon exertion:    Short of breath when lying flat:    Irregular heart rhythm:        Vascular    Pain in calf, thigh, or hip brought  on by ambulation:    Pain in feet at night that wakes you up from your sleep:     Blood clot in your veins:    Leg swelling:         Pulmonary    Oxygen at home:    Productive cough:     Wheezing:         Neurologic    Sudden weakness in arms or legs:     Sudden numbness in arms or legs:     Sudden onset of difficulty speaking or slurred speech:    Temporary loss of vision in one eye:     Problems with dizziness:         Gastrointestinal    Blood in stool:     Vomited blood:         Genitourinary    Burning when urinating:     Blood in urine:        Psychiatric    Major depression:         Hematologic    Bleeding problems:    Problems with blood clotting too easily:        Skin    Rashes or ulcers:        Constitutional    Fever or chills:      PHYSICAL EXAMINATION:  Vitals:   03/04/24 0702  BP: 131/61  Pulse: 76  Temp: (!) 97.4 F (36.3 C)  SpO2: 99%  Weight: 96.6 kg  Height: 5\' 5"  (1.651 m)    General:  WDWN in NAD; vital signs documented above Gait: Not observed HENT: WNL, normocephalic Pulmonary: normal non-labored breathing ,  Cardiac: regular HR,  Abdomen: soft, NT, no masses Skin: without rashes Vascular Exam/Pulses:  Right Left  Radial 2+ (normal) 2+ (normal)  Ulnar 2+ (normal) 2+ (normal)                   Extremities: without ischemic changes, without Gangrene , without cellulitis; without open wounds;  Scar on the right ulnar aspect, previously had a tendon release  Musculoskeletal: no muscle wasting or atrophy  Neurologic: A&O X 3;  No focal weakness or paresthesias are detected Psychiatric:  The pt has Normal affect.   Non-Invasive Vascular Imaging:   +-----------------+-------------+----------+--------+  Right Cephalic   Diameter (cm)Depth (cm)Findings   +-----------------+-------------+----------+--------+  Shoulder            0.20                         +-----------------+-------------+----------+--------+  Prox upper arm       0.31                         +-----------------+-------------+----------+--------+  Mid upper arm        0.27                         +-----------------+-------------+----------+--------+  Dist upper arm       0.30                         +-----------------+-------------+----------+--------+  Antecubital fossa    0.21                         +-----------------+-------------+----------+--------+  Prox forearm         0.20                         +-----------------+-------------+----------+--------+  Mid forearm          0.21                         +-----------------+-------------+----------+--------+  Dist forearm         0.17                         +-----------------+-------------+----------+--------+   +-----------------+-------------+----------+--------+  Right Basilic    Diameter (cm)Depth (cm)Findings  +-----------------+-------------+----------+--------+  Mid upper arm        0.30                         +-----------------+-------------+----------+--------+  Dist upper arm       0.35                         +-----------------+-------------+----------+--------+  Antecubital fossa    0.25                         +-----------------+-------------+----------+--------+   +-----------------+-------------+----------+--------+  Left Cephalic    Diameter (cm)Depth (cm)Findings  +-----------------+-------------+----------+--------+  Shoulder            0.26                         +-----------------+-------------+----------+--------+  Prox upper arm       0.20                         +-----------------+-------------+----------+--------+  Mid upper arm        0.21                          +-----------------+-------------+----------+--------+  Dist upper arm       0.16                         +-----------------+-------------+----------+--------+  Antecubital fossa    0.17                         +-----------------+-------------+----------+--------+  Prox forearm         0.15                         +-----------------+-------------+----------+--------+  Mid forearm          0.13                         +-----------------+-------------+----------+--------+  Dist forearm         0.16                         +-----------------+-------------+----------+--------+   +-----------------+-------------+----------+--------------+  Left Basilic     Diameter (cm)Depth (cm)   Findings     +-----------------+-------------+----------+--------------+  Mid upper arm                           not visualized  +-----------------+-------------+----------+--------------+  Dist upper arm                          not visualized  +-----------------+-------------+----------+--------------+  Antecubital fossa  not visualized  +-----------------+-------------+----------+--------------+    Right Pre-Dialysis Findings:  +-----------------------+----------+--------------------+---------+--------  +  Location              PSV (cm/s)Intralum. Diam. (cm)Waveform  Comments  +-----------------------+----------+--------------------+---------+--------  +  Brachial Antecub. fossa85        0.50                triphasic           +-----------------------+----------+--------------------+---------+--------  +  Radial Art at Wrist    94        0.22                triphasic           +-----------------------+----------+--------------------+---------+--------  +  Ulnar Art at Wrist     57        0.16                triphasic           +-----------------------+----------+--------------------+---------+--------  +        Left Pre-Dialysis Findings:  +-----------------------+----------+--------------------+---------+--------  +  Location              PSV (cm/s)Intralum. Diam. (cm)Waveform  Comments  +-----------------------+----------+--------------------+---------+--------  +  Brachial Antecub. fossa108       0.44                triphasic           +-----------------------+----------+--------------------+---------+--------  +  Radial Art at Wrist    100       0.27                triphasic           +-----------------------+----------+--------------------+---------+--------  +  Ulnar Art at Wrist     1         0.16                triphasic           +-----------------------+----------+--------------------+---------+--------  +      Summary:    Right: Patent brachial, radial, and ulnar arteries.  Left: Patent brachial, radial, and ulnar arteries.  *See table(s) above for measurements and observations.    ASSESSMENT/PLAN:  Jenet Durio is a 73 y.o. female who presents with end stage renal disease  Based on vein mapping and examination, the right arm appears to have sufficient basilic vein, possibly cephalic vein after block for fistula creation.  I had an extensive discussion with this patient in regards to the nature of access surgery, including risk, benefits, and alternatives.   The patient is aware that the risks of access surgery include but are not limited to: bleeding, infection, steal syndrome, nerve damage, ischemic monomelic neuropathy, failure of access to mature, complications related to venous hypertension, and possible need for additional access procedures in the future.  I discussed with the patient the nature of the staged access procedure, specifically the need for a second operation to transpose the first stage fistula if it matures adequately.   The patient has agreed to proceed with the above procedure which will be scheduled at her  convenience.  Veronica Sparrow, MD [facility] [phone]

## 2024-03-04 NOTE — Discharge Instructions (Signed)

## 2024-03-04 NOTE — Op Note (Signed)
    NAME: Veronica Simmons    MRN: [id number] DOB: 1951/07/19    DATE OF OPERATION: 03/04/2024  PREOP DIAGNOSIS:    End-stage renal disease  POSTOP DIAGNOSIS:    Same  PROCEDURE:    Right arm brachiocephalic fistula  SURGEON: Victorino Sparrow  ASSIST: Staff  ANESTHESIA: Moderate, block  EBL: 20 mL  INDICATIONS:    Taraneh Metheney is a 73 y.o. female with history of incisional disease in need of long-term HD access.  She is currently being dialyzed through a right-sided internal jugular catheter.  I have discussed the risks and benefits of right arm fistula, likely brachiobasilic, Berdine elected to proceed.  FINDINGS:   Sizable cephalic vein measuring 3 mm on the lateral aspect of the antecubital fossa. This required a significant swing to the right brachial artery with multiple branch ligations and subsequent counterincision to release tension.  TECHNIQUE:   The patient was brought to the operating room and placed in supine position. The right arm was prepped and draped in standard fashion. IV antibiotics were administered. A timeout was performed.   The case began with ultrasound insonation of the brachial artery and cephalic vein, which demonstrated sufficient size at the antecubital fossa for arteriovenous fistula.   A transverse incision was made at the elbow creese in the antecubital fossa. The  cephalic vein was lateral, therefore was isolated for 10 cm in length. Next the aponeurosis was partially released and the brachial artery secured with a vessel loop. The patient was heparinized. The cephalic vein was transected and ligated distally with a 2-0 silk stick-tie. The vein was dilated with coronary dilators and flushed with heparin saline. Vascular clamps were placed proximally and distally on the brachial artery and a 5 mm arteriotomy was created on the brachial artery. This was flushed with heparin saline.  An anastomosis was created in end to side  fashion on the brachial artery using running 6-0 Prolene suture.  Prior to completing the anastomosis, the vessels were flushed and the suture line was tied down. There was an excellent thrill in the cephalic vein from the anastomosis to the mid upper bicipital region. The patient had a multiphasic radial and ulnar signal. He had an excellent doppler signal in the fistula.  The fistula was under tension, therefore a counterincision was made on the mid bicep and the cephalic vein released from overlying tissue.  There was a thrill in the fistula with a palpable pulse at the wrist at this point.  The incisions was irrigated and hemostasis achieved with cautery and suture. The deeper tissue was closed with 3-0 Vicryl and the skin closed with 4-0 Monocryl.  Dermabond was applied to the incisions. The patient was transferred to PACU in stable condition.    Victorino Sparrow, MD [facility] DATE OF DICTATION:   03/04/2024

## 2024-03-04 NOTE — Anesthesia Procedure Notes (Signed)
 Anesthesia Regional Block: Supraclavicular block   Pre-Anesthetic Checklist: , timeout performed,  Correct Patient, Correct Site, Correct Laterality,  Correct Procedure, Correct Position, site marked,  Risks and benefits discussed,  Surgical consent,  Pre-op evaluation,  At surgeon's request and post-op pain management  Laterality: Right  Prep: chloraprep       Needles:  Injection technique: Single-shot  Needle Type: Echogenic Stimulator Needle     Needle Length: 5cm  Needle Gauge: 22     Additional Needles:   Procedures:, nerve stimulator,,, ultrasound used (permanent image in chart),,     Nerve Stimulator or Paresthesia:  Response: hand, 0.45 mA  Additional Responses:   Narrative:  Start time: 03/04/2024 9:42 AM End time: 03/04/2024 9:48 AM Injection made incrementally with aspirations every 5 mL.  Performed by: Personally  Anesthesiologist: Mal Amabile, MD  Additional Notes: Functioning IV was confirmed and monitors were applied.  A 50mm 22ga Arrow echogenic stimulator needle was used. Sterile prep and drape,hand hygiene and sterile gloves were used. Ultrasound guidance: relevant anatomy identified, needle position confirmed, local anesthetic spread visualized around nerve(s)., vascular puncture avoided.  Image printed for medical record. Negative aspiration and negative test dose prior to incremental administration of local anesthetic. The patient tolerated the procedure well.

## 2024-03-05 ENCOUNTER — Encounter (HOSPITAL_COMMUNITY): Payer: Self-pay | Admitting: Vascular Surgery

## 2024-03-05 NOTE — Anesthesia Postprocedure Evaluation (Signed)
 Anesthesia Post Note  Patient: Veronica Simmons  Procedure(s) Performed: RIGHT ARTERIOVENOUS (AV) FISTULA CREATION (Right: Arm Upper)     Patient location during evaluation: PACU Anesthesia Type: Regional and MAC Level of consciousness: awake and alert Pain management: pain level controlled Vital Signs Assessment: post-procedure vital signs reviewed and stable Respiratory status: spontaneous breathing, nonlabored ventilation, respiratory function stable and patient connected to nasal cannula oxygen Cardiovascular status: stable and blood pressure returned to baseline Postop Assessment: no apparent nausea or vomiting Anesthetic complications: no   There were no known notable events for this encounter.  Last Vitals:  Vitals:   03/04/24 1230 03/04/24 1245  BP: (!) 121/58 (!) 136/58  Pulse: 75 (!) 59  Resp: 16 13  Temp:  36.9 C  SpO2: 96% 95%    Last Pain:  Vitals:   03/04/24 1245  PainSc: 0-No pain                 [name]

## 2024-03-24 ENCOUNTER — Encounter (HOSPITAL_BASED_OUTPATIENT_CLINIC_OR_DEPARTMENT_OTHER): Payer: Self-pay | Admitting: Emergency Medicine

## 2024-03-24 ENCOUNTER — Other Ambulatory Visit: Payer: Self-pay

## 2024-03-24 ENCOUNTER — Ambulatory Visit (HOSPITAL_BASED_OUTPATIENT_CLINIC_OR_DEPARTMENT_OTHER): Payer: 59

## 2024-03-24 ENCOUNTER — Emergency Department (HOSPITAL_BASED_OUTPATIENT_CLINIC_OR_DEPARTMENT_OTHER)

## 2024-03-24 ENCOUNTER — Emergency Department (HOSPITAL_BASED_OUTPATIENT_CLINIC_OR_DEPARTMENT_OTHER)
Admission: EM | Admit: 2024-03-24 | Discharge: 2024-03-24 | Disposition: A | Discharge status: Home / Self Care | Attending: Emergency Medicine | Admitting: Emergency Medicine

## 2024-03-24 DIAGNOSIS — K449 Diaphragmatic hernia without obstruction or gangrene: Secondary | ICD-10-CM | POA: Insufficient documentation

## 2024-03-24 DIAGNOSIS — I509 Heart failure, unspecified: Secondary | ICD-10-CM | POA: Diagnosis not present

## 2024-03-24 DIAGNOSIS — I132 Hypertensive heart and chronic kidney disease with heart failure and with stage 5 chronic kidney disease, or end stage renal disease: Secondary | ICD-10-CM | POA: Insufficient documentation

## 2024-03-24 DIAGNOSIS — M47814 Spondylosis without myelopathy or radiculopathy, thoracic region: Secondary | ICD-10-CM | POA: Diagnosis not present

## 2024-03-24 DIAGNOSIS — N186 End stage renal disease: Secondary | ICD-10-CM | POA: Insufficient documentation

## 2024-03-24 DIAGNOSIS — R0602 Shortness of breath: Secondary | ICD-10-CM | POA: Diagnosis not present

## 2024-03-24 DIAGNOSIS — R9431 Abnormal electrocardiogram [ECG] [EKG]: Secondary | ICD-10-CM | POA: Diagnosis not present

## 2024-03-24 DIAGNOSIS — Z79899 Other long term (current) drug therapy: Secondary | ICD-10-CM | POA: Insufficient documentation

## 2024-03-24 DIAGNOSIS — Z794 Long term (current) use of insulin: Secondary | ICD-10-CM | POA: Diagnosis not present

## 2024-03-24 DIAGNOSIS — I2510 Atherosclerotic heart disease of native coronary artery without angina pectoris: Secondary | ICD-10-CM | POA: Diagnosis not present

## 2024-03-24 DIAGNOSIS — R059 Cough, unspecified: Secondary | ICD-10-CM | POA: Diagnosis present

## 2024-03-24 DIAGNOSIS — J40 Bronchitis, not specified as acute or chronic: Secondary | ICD-10-CM | POA: Diagnosis not present

## 2024-03-24 DIAGNOSIS — I700 Atherosclerosis of aorta: Secondary | ICD-10-CM | POA: Insufficient documentation

## 2024-03-24 DIAGNOSIS — E1122 Type 2 diabetes mellitus with diabetic chronic kidney disease: Secondary | ICD-10-CM | POA: Insufficient documentation

## 2024-03-24 LAB — CBC WITH DIFFERENTIAL/PLATELET
Abs Immature Granulocytes: 0.03 10*3/uL (ref 0.00–0.07)
Basophils Absolute: 0 10*3/uL (ref 0.0–0.1)
Basophils Relative: 0 %
Eosinophils Absolute: 0.2 10*3/uL (ref 0.0–0.5)
Eosinophils Relative: 3 %
HCT: 30.2 % — ABNORMAL LOW (ref 36.0–46.0)
Hemoglobin: 9.6 g/dL — ABNORMAL LOW (ref 12.0–15.0)
Immature Granulocytes: 0 %
Lymphocytes Relative: 31 %
Lymphs Abs: 2.3 10*3/uL (ref 0.7–4.0)
MCH: 30.2 pg (ref 26.0–34.0)
MCHC: 31.8 g/dL (ref 30.0–36.0)
MCV: 95 fL (ref 80.0–100.0)
Monocytes Absolute: 0.6 10*3/uL (ref 0.1–1.0)
Monocytes Relative: 7 %
Neutro Abs: 4.5 10*3/uL (ref 1.7–7.7)
Neutrophils Relative %: 59 %
Platelets: 294 10*3/uL (ref 150–400)
RBC: 3.18 MIL/uL — ABNORMAL LOW (ref 3.87–5.11)
RDW: 17.8 % — ABNORMAL HIGH (ref 11.5–15.5)
WBC: 7.6 10*3/uL (ref 4.0–10.5)
nRBC: 0.3 % — ABNORMAL HIGH (ref 0.0–0.2)

## 2024-03-24 LAB — RESP PANEL BY RT-PCR (RSV, FLU A&B, COVID)  RVPGX2
Influenza A by PCR: NEGATIVE
Influenza B by PCR: NEGATIVE
Resp Syncytial Virus by PCR: NEGATIVE
SARS Coronavirus 2 by RT PCR: NEGATIVE

## 2024-03-24 LAB — BASIC METABOLIC PANEL WITH GFR
Anion gap: 11 (ref 5–15)
BUN: 17 mg/dL (ref 8–23)
CO2: 30 mmol/L (ref 22–32)
Calcium: 8.1 mg/dL — ABNORMAL LOW (ref 8.9–10.3)
Chloride: 96 mmol/L — ABNORMAL LOW (ref 98–111)
Creatinine, Ser: 3.67 mg/dL — ABNORMAL HIGH (ref 0.44–1.00)
GFR, Estimated: 12 mL/min — ABNORMAL LOW (ref >60)
Glucose, Bld: 108 mg/dL — ABNORMAL HIGH (ref 70–99)
Potassium: 3.5 mmol/L (ref 3.5–5.1)
Sodium: 137 mmol/L (ref 135–145)

## 2024-03-24 LAB — BRAIN NATRIURETIC PEPTIDE: B Natriuretic Peptide: 118.6 pg/mL — ABNORMAL HIGH (ref 0.0–100.0)

## 2024-03-24 MED ORDER — PREDNISONE 20 MG PO TABS: 20.0000 mg | ORAL_TABLET | Freq: Every day | ORAL | 0 refills | Status: AC

## 2024-03-24 MED ORDER — DOXYCYCLINE HYCLATE 100 MG PO CAPS: 100.0000 mg | ORAL_CAPSULE | Freq: Two times a day (BID) | ORAL | 0 refills | Status: DC

## 2024-03-24 MED ORDER — CEFDINIR 300 MG PO CAPS: 300.0000 mg | ORAL_CAPSULE | Freq: Two times a day (BID) | ORAL | 0 refills | Status: AC

## 2024-03-24 MED ORDER — ALBUTEROL SULFATE HFA 108 (90 BASE) MCG/ACT IN AERS: 1.0000 | INHALATION_SPRAY | Freq: Four times a day (QID) | RESPIRATORY_TRACT | 0 refills | Status: AC | PRN

## 2024-03-24 NOTE — ED Triage Notes (Signed)
 Cough x 1 week , shortness of breath with coughing . Denies chest pain . Nasal congestion , no fever

## 2024-03-24 NOTE — Discharge Instructions (Addendum)
 It was a pleasure taking care of you today  I have started you on a few medications  Take as prescribed  Make sure to check your blood sugars frequently at home. If greater than 200 stop taking the prednisone  Return for new or worsening symptoms

## 2024-03-24 NOTE — ED Provider Notes (Signed)
 [place] [facility] Provider Note   CSN: [id number] Arrival date & time: 03/24/24  1625     History  Chief Complaint  Patient presents with   Cough    Veronica Simmons is a 73 y.o. female ESRD, hypertension, diabetes, CHF for evaluation of cough.  States she has had a cough productive of yellow sputum over the last week or so.  She has fatigue, subjective fevers, nasal congestion and rhinorrhea as well.  Dialysis access placed about 3 weeks ago.  She denies any chest pain or shortness of breath.  No history of PE or DVT.  No pain or swelling lower extremities.  She was last dialyzed today and did a full session.  She still makes urine.  She denies any PND orthopnea however states her coughing does worsen when she lays flat at night.  States sleeps with 2 pillows.  No abdominal pain, diarrhea.  unknown sick contacts.  HPI     Home Medications Prior to Admission medications   Medication Sig Start Date End Date Taking? Authorizing Provider  albuterol (VENTOLIN HFA) 108 (90 Base) MCG/ACT inhaler Inhale 1-2 puffs into the lungs every 6 (six) hours as needed for wheezing or shortness of breath. 03/24/24  Yes Oluwakemi Salsberry A, PA-C  cefdinir (OMNICEF) 300 MG capsule Take 1 capsule (300 mg total) by mouth 2 (two) times daily for 5 days. 03/24/24 03/29/24 Yes Denajah Farias A, PA-C  doxycycline (VIBRAMYCIN) 100 MG capsule Take 1 capsule (100 mg total) by mouth 2 (two) times daily. 03/24/24  Yes Sirr Kabel A, PA-C  predniSONE (DELTASONE) 20 MG tablet Take 1 tablet (20 mg total) by mouth daily for 5 days. 03/24/24 03/29/24 Yes Anala Whisenant A, PA-C  amLODipine (NORVASC) 10 MG tablet Take 10 mg by mouth every morning.    [provider]  furosemide (LASIX) 80 MG tablet Take 80 mg by mouth daily.    [provider]  glucose blood (ACCU-CHEK AVIVA PLUS) test strip Use as instructed Patient taking differently: 1 each by Other route as  directed. Use as instructed 01/06/16   Haney, Arlyss Repress A, MD  glucose blood (ACCU-CHEK SMARTVIEW) test strip Test blood sugar once daily Patient taking differently: 1 each by Other route See admin instructions. Test blood sugar once daily 10/12/16   Haney, Alyssa A, MD  glucose blood test strip Use as instructed Patient taking differently: 1 each by Other route as directed. Use as instructed 12/29/15   Velora Heckler A, MD  hydrALAZINE (APRESOLINE) 25 MG tablet Take 25 mg by mouth in the morning and at bedtime. 08/09/20   [provider]  insulin glargine, 2 Unit Dial, (TOUJEO MAX SOLOSTAR) 300 UNIT/ML Solostar Pen Inject 10 Units into the skin daily as needed (high blood sugar). 06/02/21   [provider]  insulin lispro (HUMALOG) 100 UNIT/ML KwikPen Inject 0.01-0.09 mLs (1-9 Units total) into the skin 3 (three) times daily before meals. Blood sugars 70-120: no Insulin 121-150: 2 units 151-200: 3 units 201-250: 4 units 251-300: 5 units 301-350: 6 units 351-400: 9 units More than 400 use 9 units and call your doctor 08/09/20   Dorcas Carrow, MD  Iron-Vitamin C 100-250 MG TABS Take 1 tablet by mouth in the morning and at bedtime.    [provider]  Lancets [facility] ULTRASOFT) lancets Use as instructed Patient taking differently: 1 each by Other route as directed. Use as instructed 08/27/15   Bonney Aid, MD  lidocaine (  LIDODERM) 5 % Place 1 patch onto the skin daily. Remove & Discard patch within 12 hours or as directed by MD 09/27/23   Ryhanna Dunsmore A, PA-C  losartan (COZAAR) 25 MG tablet Take 25 mg by mouth daily. 10/05/20   [provider]  methocarbamol (ROBAXIN) 500 MG tablet Take 1 tablet (500 mg total) by mouth daily. Patient taking differently: Take 500 mg by mouth daily as needed. 09/27/23   Annissa Andreoni A, PA-C  OVER THE COUNTER MEDICATION Take 1 tablet by mouth 2 (two) times daily. For Leg Cramps  Patient is no longer taking this medication     [provider]  oxyCODONE-acetaminophen (PERCOCET/ROXICET) 5-325 MG tablet Take 1 tablet by mouth every 6 (six) hours as needed. 03/04/24   Lars Mage, PA-C  OZEMPIC, 2 MG/DOSE, 8 MG/3ML SOPN Inject 2 mg into the skin once a week. Takes on friday    [provider]  PARoxetine (PAXIL) 10 MG tablet Take 1 tablet (10 mg total) by mouth at bedtime. Patient taking differently: Take 10 mg by mouth at bedtime. Patient is not taking this med 02/21/24   Levie Heritage, DO  PRESCRIPTION MEDICATION IRON INFUSIONS as needed. [facility]).    [provider]  propranolol (INDERAL) 40 MG tablet Take 40 mg by mouth 2 (two) times daily.    [provider]  traMADol (ULTRAM) 50 MG tablet Take 1 tablet (50 mg total) by mouth every 6 (six) hours as needed. 12/20/23 12/19/24  Dagoberto Ligas, MD  traZODone (DESYREL) 100 MG tablet Take 100 mg by mouth at bedtime. 03/18/20   [provider]      Allergies    Levofloxacin    Review of Systems   Review of Systems  Constitutional:  Positive for fatigue and fever (subjective).  HENT:  Positive for congestion and rhinorrhea.   Respiratory:  Positive for cough.   Cardiovascular: Negative.   Gastrointestinal: Negative.   Genitourinary: Negative.   Musculoskeletal: Negative.   Skin: Negative.   Neurological:  Positive for weakness (generalized). Negative for dizziness, syncope, speech difficulty, light-headedness and numbness.  All other systems reviewed and are negative.   Physical Exam Updated Vital Signs BP (!) 156/68 (BP Location: Right Wrist)   Pulse 78   Temp 98.4 F (36.9 C) (Oral)   Resp 20   SpO2 98%  Physical Exam Vitals and nursing note reviewed.  Constitutional:      General: She is not in acute distress.    Appearance: She is well-developed. She is obese. She is not ill-appearing, toxic-appearing or diaphoretic.  HENT:     Head: Atraumatic.     Nose: Nose normal.     Mouth/Throat:     Mouth: Mucous  membranes are moist.  Eyes:     Pupils: Pupils are equal, round, and reactive to light.  Cardiovascular:     Rate and Rhythm: Normal rate.     Pulses: Normal pulses.          Radial pulses are 2+ on the right side and 2+ on the left side.       Dorsalis pedis pulses are 2+ on the right side and 2+ on the left side.     Heart sounds: Normal heart sounds.  Pulmonary:     Effort: No respiratory distress.     Breath sounds: Rhonchi present.     Comments: Mild rhonchi at bases, clears with cough.  Speaks without difficulty Chest:  Comments: Tunneled dialysis access right upper chest wall without erythema, warmth or drainage Abdominal:     General: There is no distension.  Musculoskeletal:        General: Normal range of motion.     Cervical back: Normal range of motion.     Comments: No bony tenderness, compartments soft.  No lower extremity edema, full range of motion Dialysis access right upper arm with palpable thrill no overlying skin changes  Skin:    General: Skin is warm and dry.     Capillary Refill: Capillary refill takes less than 2 seconds.     Comments: No rashes or lesions to exposed skin  Neurological:     General: No focal deficit present.     Mental Status: She is alert.  Psychiatric:        Mood and Affect: Mood normal.     ED Results / Procedures / Treatments   Labs (all labs ordered are listed, but only abnormal results are displayed) Labs Reviewed  CBC WITH DIFFERENTIAL/PLATELET - Abnormal; Notable for the following components:      Result Value   RBC 3.18 (*)    Hemoglobin 9.6 (*)    HCT 30.2 (*)    RDW 17.8 (*)    nRBC 0.3 (*)    All other components within normal limits  BASIC METABOLIC PANEL WITH GFR - Abnormal; Notable for the following components:   Chloride 96 (*)    Glucose, Bld 108 (*)    Creatinine, Ser 3.67 (*)    Calcium 8.1 (*)    GFR, Estimated 12 (*)    All other components within normal limits  BRAIN NATRIURETIC PEPTIDE -  Abnormal; Notable for the following components:   B Natriuretic Peptide 118.6 (*)    All other components within normal limits  RESP PANEL BY RT-PCR (RSV, FLU A&B, COVID)  RVPGX2    EKG None  Radiology CT Chest Wo Contrast Result Date: 03/24/2024 CLINICAL DATA:  Respiratory illness, nondiagnostic xray Cough and shortness of breath. EXAM: CT CHEST WITHOUT CONTRAST TECHNIQUE: Multidetector CT imaging of the chest was performed following the standard protocol without IV contrast. RADIATION DOSE REDUCTION: This exam was performed according to the departmental dose-optimization program which includes automated exposure control, adjustment of the mA and/or kV according to patient size and/or use of iterative reconstruction technique. COMPARISON:  Radiograph earlier today.  CT 10/16/2019 FINDINGS: Cardiovascular: Right-sided dialysis catheter tip in the lower SVC. The heart is normal in size. Dilated main pulmonary artery, 3.3 cm. No pericardial effusion. Aortic atherosclerosis and coronary artery calcifications. Mediastinum/Nodes: Scattered small mediastinal lymph nodes are not enlarged by size criteria. Tiny hiatal hernia, otherwise unremarkable esophagus. Lungs/Pleura: Subsegmental atelectasis in the right lower lobe dependently. Mild central and lower lobe bronchial thickening. No confluent airspace disease. Pleural based 5 mm left lower lobe nodule series 4, image 74 is retrospectively unchanged from prior exam, considered definitively benign. This needs no further imaging follow-up. No new pulmonary nodules. No pleural effusion. Upper Abdomen: No acute findings. Musculoskeletal: Thoracic spondylosis with anterior spurring. Left greater than right glenohumeral degenerative change. There are no acute or suspicious osseous abnormalities. IMPRESSION: 1. Mild central and lower lobe bronchial thickening, query bronchitis or reactive airways disease. 2. Dilated main pulmonary artery, can be seen with pulmonary  arterial hypertension. 3. Coronary artery calcifications. Aortic Atherosclerosis (ICD10-I70.0). Electronically Signed   By: Narda Rutherford M.D.   On: 03/24/2024 23:04   DG Chest 2 View Result Date: 03/24/2024 CLINICAL  DATA:  Cough for a week. EXAM: CHEST - 2 VIEW COMPARISON:  X-ray 06/29/2023. FINDINGS: No consolidation, pneumothorax or effusion. No edema. Normal cardiopericardial silhouette. Calcified aorta. Right IJ double-lumen catheter in place with tip along the central SVC. Stable fullness of the hila bilaterally which could represent enlarged pulmonary arteries. IMPRESSION: Right IJ double-lumen catheter.  Prominent central vasculature. Electronically Signed   By: Karen Kays M.D.   On: 03/24/2024 17:49    Procedures Procedures    Medications Ordered in ED Medications - No data to display  ED Course/ Medical Decision Making/ A&P   73 year old here for evaluation of cough, nasal congestion, subjective fever and generalized weakness over the last week or so.  She denies any chest pain or shortness of breath.  No PND orthopnea.  Does state her cough gets worse when she lays flat though she does not appear grossly fluid overloaded.  She went to dialysis today and received a full session.  Unknown sick contacts.  She has a wet cough in the room.  She has some rhonchi which clears with her coughing.  Has been on dialysis for 2 months  Labs and imaging personally viewed and interpreted:  COVID flu, RSV negative Chest x-ray without cardiomegaly, infiltrates does show some vascular congestion CBC without leukocytosis, hemoglobin 9.6, similar to prior BMP stable BNP 118 EKG without ischemic changes CT chest shows reactive airway disease versus bronchitis.  Also likely pulmonary hypertension.  Patient reassessed.  Discussed results with patient.  No hypoxia.  Does not appear grossly fluid overloaded.  Given ESRD will give the antibiotics, will also cover atypical and MRSA.  Will also start  short course steroids and albuterol.  We had long discussion to check blood sugars at home.  If greater than 200 she will stop taking the steroids.  Given dialysis  Considered ACS, PE, dissection, abscess, pulm edema, fluid overload, pneumothorax, sepsis  The patient has been appropriately medically screened and/or stabilized in the ED. I have low suspicion for any other emergent medical condition which would require further screening, evaluation or treatment in the ED or require inpatient management.  Patient is hemodynamically stable and in no acute distress.  Patient able to ambulate in department prior to ED.  Evaluation does not show acute pathology that would require ongoing or additional emergent interventions while in the emergency department or further inpatient treatment.  I have discussed the diagnosis with the patient and answered all questions.  Pain is been managed while in the emergency department and patient has no further complaints prior to discharge.  Patient is comfortable with plan discussed in room and is stable for discharge at this time.  I have discussed strict return precautions for returning to the emergency department.  Patient was encouraged to follow-up with PCP/specialist refer to at discharge.                                Medical Decision Making Amount and/or Complexity of Data Reviewed Independent Historian: friend External Data Reviewed: labs, radiology, ECG and notes. Labs: ordered. Decision-making details documented in ED Course. Radiology: ordered and independent interpretation performed. Decision-making details documented in ED Course. ECG/medicine tests: ordered and independent interpretation performed. Decision-making details documented in ED Course.  Risk OTC drugs. Prescription drug management. Decision regarding hospitalization. Diagnosis or treatment significantly limited by social determinants of health.          Final Clinical  Impression(s) /  ED Diagnoses Final diagnoses:  ESRD (end stage renal disease) (HCC)  Bronchitis    Rx / DC Orders ED Discharge Orders          Ordered    predniSONE (DELTASONE) 20 MG tablet  Daily        03/24/24 2325    albuterol (VENTOLIN HFA) 108 (90 Base) MCG/ACT inhaler  Every 6 hours PRN        03/24/24 2325    doxycycline (VIBRAMYCIN) 100 MG capsule  2 times daily        03/24/24 2325    cefdinir (OMNICEF) 300 MG capsule  2 times daily        03/24/24 2325              Miles Borkowski A, PA-C 03/24/24 2330    Anders Simmonds T, DO 03/25/24 2347

## 2024-03-24 NOTE — ED Notes (Signed)
 Patient transported to CT

## 2024-04-03 ENCOUNTER — Encounter (HOSPITAL_COMMUNITY): Payer: Self-pay

## 2024-04-18 ENCOUNTER — Other Ambulatory Visit: Payer: Self-pay | Admitting: *Deleted

## 2024-04-18 DIAGNOSIS — N186 End stage renal disease: Secondary | ICD-10-CM

## 2024-04-24 ENCOUNTER — Encounter: Payer: Self-pay | Admitting: Physician Assistant

## 2024-04-24 ENCOUNTER — Ambulatory Visit: Attending: Vascular Surgery | Admitting: Physician Assistant

## 2024-04-24 ENCOUNTER — Ambulatory Visit (HOSPITAL_COMMUNITY)
Admission: RE | Admit: 2024-04-24 | Discharge: 2024-04-24 | Disposition: A | Discharge status: Home / Self Care | Source: Ambulatory Visit | Attending: Vascular Surgery | Admitting: Vascular Surgery

## 2024-04-24 VITALS — BP 133/63 | HR 85 | Temp 98.2°F | Wt 216.5 lb

## 2024-04-24 DIAGNOSIS — Z992 Dependence on renal dialysis: Secondary | ICD-10-CM | POA: Diagnosis not present

## 2024-04-24 DIAGNOSIS — N186 End stage renal disease: Secondary | ICD-10-CM

## 2024-04-24 NOTE — Progress Notes (Signed)
 POST OPERATIVE OFFICE NOTE    CC:  F/u for surgery  HPI:  Veronica Simmons is a 73 y.o. female who is here for follow-up.  She recently underwent creation of a right upper arm brachiocephalic fistula on 03/04/2024 by Dr. Rosalva Comber.  This was done for permanent dialysis access.  She returns today for follow-up. She is doing well without any complaints. She denies any issues with her incisions such as redness, tenderness, or drainage. She also denies any symptoms of steal in the right hand such as weakness, numbness, excessive coldness or pain.   She currently gets dialysis on MWF via right IJ TDC.   Allergies  Allergen Reactions   Levofloxacin Hives    Current Outpatient Medications  Medication Sig Dispense Refill   albuterol  (VENTOLIN  HFA) 108 (90 Base) MCG/ACT inhaler Inhale 1-2 puffs into the lungs every 6 (six) hours as needed for wheezing or shortness of breath. 8 g 0   amLODipine  (NORVASC ) 10 MG tablet Take 10 mg by mouth every morning.     doxycycline  (VIBRAMYCIN ) 100 MG capsule Take 1 capsule (100 mg total) by mouth 2 (two) times daily. 20 capsule 0   furosemide  (LASIX ) 80 MG tablet Take 80 mg by mouth daily.     glucose blood (ACCU-CHEK AVIVA PLUS) test strip Use as instructed (Patient taking differently: 1 each by Other route as directed. Use as instructed) 100 each 12   glucose blood (ACCU-CHEK SMARTVIEW) test strip Test blood sugar once daily (Patient taking differently: 1 each by Other route See admin instructions. Test blood sugar once daily) 100 each 2   glucose blood test strip Use as instructed (Patient taking differently: 1 each by Other route as directed. Use as instructed) 100 each 12   hydrALAZINE  (APRESOLINE ) 25 MG tablet Take 25 mg by mouth in the morning and at bedtime.     insulin  glargine, 2 Unit Dial , (TOUJEO MAX SOLOSTAR) 300 UNIT/ML Solostar Pen Inject 10 Units into the skin daily as needed (high blood sugar).     insulin  lispro (HUMALOG ) 100 UNIT/ML KwikPen  Inject 0.01-0.09 mLs (1-9 Units total) into the skin 3 (three) times daily before meals. Blood sugars 70-120: no Insulin  121-150: 2 units 151-200: 3 units 201-250: 4 units 251-300: 5 units 301-350: 6 units 351-400: 9 units More than 400 use 9 units and call your doctor 15 mL 11   Iron-Vitamin C 100-250 MG TABS Take 1 tablet by mouth in the morning and at bedtime.     Lancets (ONETOUCH ULTRASOFT) lancets Use as instructed (Patient taking differently: 1 each by Other route as directed. Use as instructed) 100 each 12   lidocaine  (LIDODERM ) 5 % Place 1 patch onto the skin daily. Remove & Discard patch within 12 hours or as directed by MD 30 patch 0   losartan  (COZAAR ) 25 MG tablet Take 25 mg by mouth daily.     methocarbamol  (ROBAXIN ) 500 MG tablet Take 1 tablet (500 mg total) by mouth daily. (Patient taking differently: Take 500 mg by mouth daily as needed.) 7 tablet 0   OVER THE COUNTER MEDICATION Take 1 tablet by mouth 2 (two) times daily. For Leg Cramps  Patient is no longer taking this medication     oxyCODONE -acetaminophen  (PERCOCET/ROXICET) 5-325 MG tablet Take 1 tablet by mouth every 6 (six) hours as needed. 12 tablet 0   OZEMPIC, 2 MG/DOSE, 8 MG/3ML SOPN Inject 2 mg into the skin once a week. Takes on friday     PARoxetine  (PAXIL )  10 MG tablet Take 1 tablet (10 mg total) by mouth at bedtime. (Patient taking differently: Take 10 mg by mouth at bedtime. Patient is not taking this med) 30 tablet 4   PRESCRIPTION MEDICATION IRON INFUSIONS as needed. [facility]).     propranolol (INDERAL) 40 MG tablet Take 40 mg by mouth 2 (two) times daily.     traMADol  (ULTRAM ) 50 MG tablet Take 1 tablet (50 mg total) by mouth every 6 (six) hours as needed. 20 tablet 0   traZODone  (DESYREL ) 100 MG tablet Take 100 mg by mouth at bedtime.     No current facility-administered medications for this visit.     ROS:  See HPI  Physical Exam:  Incision: Well-healed right upper arm incisions without signs of infection or  dehiscence Extremities: 2+ right radial pulse.  Palpable thrill in right brachiocephalic fistula throughout the upper arm Neuro: Intact motor and sensation of right hand  Studies: Dialysis duplex (04/24/2024) +--------------------+----------+-----------------+--------+  AVF                PSV (cm/s)Flow Vol (mL/min)Comments  +--------------------+----------+-----------------+--------+  Native artery inflow   296          1486                 +--------------------+----------+-----------------+--------+  AVF Anastomosis        501                               +--------------------+----------+-----------------+--------+     +------------+----------+-------------+----------+----------------+  OUTFLOW VEINPSV (cm/s)Diameter (cm)Depth (cm)    Describe      +------------+----------+-------------+----------+----------------+  Shoulder      151        0.61        1.00                     +------------+----------+-------------+----------+----------------+  Prox UA        173        0.62        1.03                     +------------+----------+-------------+----------+----------------+  Mid UA         880        0.34        0.65   competing branch  +------------+----------+-------------+----------+----------------+  Dist UA        233        0.50        0.62                     +------------+----------+-------------+----------+----------------+  AC Fossa       268        0.54        0.30                     +------------+----------+-------------+----------+----------------+    Assessment/Plan:  This is a 73 y.o. female who is here for postop visit  - The patient recently underwent creation of a right brachiocephalic fistula for permanent dialysis access.  Her right arm incisions are well-healed -Duplex demonstrates a slow to mature fistula with diameters less than 6mm throughout most of the upper arm.  The fistula does have good flow volume of  1486 mL/min.  It also appears to be fairly deep in the upper arm outside of the [facility] fossa -She has a palpable right  radial pulse and denies any symptoms of steal in the right hand -On exam her fistula has a good thrill, however it is small in diameter and difficult to palpate given its depth -I have explained to the patient that her fistula is not mature yet. I have encouraged her to exercise the right arm in hopes to increase the size of the fistula. We will bring her back to the office in 5-6 wks for repeat duplex. She is aware that she will also likely require superficialization prior to fistula use as well   Deneise Finlay, PA-C [facility] [phone]   Call MD: Edgardo Goodwill

## 2024-04-28 ENCOUNTER — Other Ambulatory Visit: Payer: Self-pay

## 2024-04-28 DIAGNOSIS — N186 End stage renal disease: Secondary | ICD-10-CM

## 2024-06-26 ENCOUNTER — Ambulatory Visit: Attending: Vascular Surgery | Admitting: Physician Assistant

## 2024-06-26 ENCOUNTER — Ambulatory Visit (HOSPITAL_COMMUNITY)
Admission: RE | Admit: 2024-06-26 | Discharge: 2024-06-26 | Disposition: A | Discharge status: Home / Self Care | Source: Ambulatory Visit | Attending: Vascular Surgery | Admitting: Vascular Surgery

## 2024-06-26 VITALS — BP 146/72 | HR 78 | Temp 97.9°F | Ht 65.0 in | Wt 208.9 lb

## 2024-06-26 DIAGNOSIS — Z992 Dependence on renal dialysis: Secondary | ICD-10-CM

## 2024-06-26 DIAGNOSIS — N186 End stage renal disease: Secondary | ICD-10-CM | POA: Diagnosis present

## 2024-06-26 NOTE — Progress Notes (Signed)
    Postoperative Access Visit   History of Present Illness   Veronica Simmons is a 73 y.o. year old female who presents for postoperative follow-up for: right brachiocephalic arteriovenous fistula (Date: 03/04/24).  The patient's wounds are healed.  The patient denies steal symptoms.  The patient is able to complete their activities of daily living.  She is dialyzing via right IJ [facility] on a Monday Wednesday Friday schedule at the kidney center on [address].   Physical Examination   Vitals:   06/26/24 1240  BP: (!) 146/72  Pulse: 78  Temp: 97.9 F (36.6 C)  TempSrc: Temporal  SpO2: 94%  Weight: 208 lb 14.4 oz (94.8 kg)  Height: 5' 5 (1.651 m)   Body mass index is 34.76 kg/m.  right arm Incisions are healed, palpable radial pulse, hand grip is 5/5, sensation in digits is intact, palpable thrill, bruit can be auscultated     Medical Decision Making   Veronica Simmons is a 73 y.o. year old female who presents s/p right brachiocephalic arteriovenous fistula  Patent brachiocephalic without signs or symptoms of steal syndrome On exam fistula feels much better and more superficial than the duplex indicates.  The patient's access will be ready for use Monday 06/30/24 The patient's tunneled dialysis catheter can be removed when Nephrology is comfortable with the performance of the fistula The patient may follow up on a prn basis   Donnice Sender PA-C [facility] [place] Office: [phone]  Clinic MD: [place]

## 2024-07-13 ENCOUNTER — Other Ambulatory Visit: Payer: Self-pay | Admitting: Family Medicine

## 2024-08-22 ENCOUNTER — Ambulatory Visit (HOSPITAL_COMMUNITY)
Admission: RE | Admit: 2024-08-22 | Discharge: 2024-08-22 | Disposition: A | Discharge status: Home / Self Care | Attending: Vascular Surgery | Admitting: Vascular Surgery

## 2024-08-22 ENCOUNTER — Encounter (HOSPITAL_COMMUNITY)
Admission: RE | Disposition: A | Discharge status: Home / Self Care | Payer: Self-pay | Source: Home / Self Care | Attending: Vascular Surgery

## 2024-08-22 ENCOUNTER — Other Ambulatory Visit: Payer: Self-pay

## 2024-08-22 DIAGNOSIS — Z794 Long term (current) use of insulin: Secondary | ICD-10-CM | POA: Diagnosis not present

## 2024-08-22 DIAGNOSIS — N186 End stage renal disease: Secondary | ICD-10-CM | POA: Diagnosis not present

## 2024-08-22 DIAGNOSIS — T82858A Stenosis of vascular prosthetic devices, implants and grafts, initial encounter: Secondary | ICD-10-CM | POA: Insufficient documentation

## 2024-08-22 DIAGNOSIS — Z992 Dependence on renal dialysis: Secondary | ICD-10-CM | POA: Insufficient documentation

## 2024-08-22 DIAGNOSIS — Y832 Surgical operation with anastomosis, bypass or graft as the cause of abnormal reaction of the patient, or of later complication, without mention of misadventure at the time of the procedure: Secondary | ICD-10-CM | POA: Insufficient documentation

## 2024-08-22 DIAGNOSIS — E1122 Type 2 diabetes mellitus with diabetic chronic kidney disease: Secondary | ICD-10-CM | POA: Diagnosis not present

## 2024-08-22 DIAGNOSIS — Z7985 Long-term (current) use of injectable non-insulin antidiabetic drugs: Secondary | ICD-10-CM | POA: Insufficient documentation

## 2024-08-22 DIAGNOSIS — T82510A Breakdown (mechanical) of surgically created arteriovenous fistula, initial encounter: Secondary | ICD-10-CM | POA: Diagnosis present

## 2024-08-22 DIAGNOSIS — Z5986 Financial insecurity: Secondary | ICD-10-CM | POA: Insufficient documentation

## 2024-08-22 DIAGNOSIS — I120 Hypertensive chronic kidney disease with stage 5 chronic kidney disease or end stage renal disease: Secondary | ICD-10-CM | POA: Diagnosis not present

## 2024-08-22 HISTORY — PX: A/V SHUNT INTERVENTION: CATH118220

## 2024-08-22 HISTORY — PX: VENOUS ANGIOPLASTY: CATH118376

## 2024-08-22 SURGERY — A/V SHUNT INTERVENTION
Anesthesia: LOCAL | Site: Arm Upper | Laterality: Right

## 2024-08-22 MED ORDER — HEPARIN (PORCINE) IN NACL 1000-0.9 UT/500ML-% IV SOLN
INTRAVENOUS | Status: DC | PRN
  Administered 2024-08-22: 500 mL

## 2024-08-22 MED ORDER — LIDOCAINE HCL (PF) 1 % IJ SOLN
INTRAMUSCULAR | Status: AC
Start: 2024-08-22 — End: 2024-08-22
  Filled 2024-08-22: qty 30

## 2024-08-22 MED ORDER — IODIXANOL 320 MG/ML IV SOLN
INTRAVENOUS | Status: DC | PRN
  Administered 2024-08-22: 35 mL

## 2024-08-22 MED ORDER — LIDOCAINE HCL (PF) 1 % IJ SOLN
INTRAMUSCULAR | Status: DC | PRN
  Administered 2024-08-22: 2 mL via INTRADERMAL

## 2024-08-22 SURGICAL SUPPLY — 10 items
BALLOON MUSTANG 6.0X20 75 (BALLOONS) IMPLANT
BALLOON MUSTANG 7X60X75 (BALLOONS) IMPLANT
KIT ENCORE 26 ADVANTAGE (KITS) IMPLANT
KIT MICROPUNCTURE NIT STIFF (SHEATH) IMPLANT
KIT PV (KITS) ×3 IMPLANT
SHEATH PINNACLE R/O II 6F 4CM (SHEATH) IMPLANT
SHEATH PROBE COVER 6X72 (BAG) IMPLANT
TRAY PV CATH (CUSTOM PROCEDURE TRAY) ×3 IMPLANT
TUBING CIL FLEX 10 FLL-RA (TUBING) IMPLANT
WIRE BENTSON .035X145CM (WIRE) IMPLANT

## 2024-08-22 NOTE — H&P (Signed)
 H&P     History of Present Illness: This is a 73 y.o. female with end-stage renal disease that presents for fistulogram due to malfunction of right arm AV fistula.  Currently using a right arm brachiocephalic AV fistula placed on 6/81/7974 by Dr. Lanis.  Past Medical History:  Diagnosis Date   ALLERGIC RHINITIS 02/19/2007   Qualifier: Diagnosis of  By: FRANCO GASKINS MD, VALENICA   Anemia 1970s   Hx of iron infursions and pt currently on iron pills   Arthritis    Chronic kidney disease    dialysis M-W-F   Cubital tunnel syndrome on right 2020   Secondary to old fracture. Evaluated by ortho. [facility] offered surgery but patient refused. Numbness and burning pain 4th and 5th digit.     Daytime sleepiness    Diabetes mellitus 2000   type 2   High cholesterol    Hypertension 2000   Leg cramps    Myocardial infarction (HCC)    02/2018   Pneumonia    x 1    Past Surgical History:  Procedure Laterality Date   AV FISTULA PLACEMENT Right 03/04/2024   Procedure: RIGHT ARTERIOVENOUS (AV) FISTULA CREATION;  Surgeon: Lanis Fonda BRAVO, MD;  Location: [facility] OR;  Service: Vascular;  Laterality: Right;  Peripheral Nerve Block   COLONOSCOPY     DG HAND RIGHT COMPLETE ([facility] HX) Right 2020   right hand/wrist/elbow surgery to fix carpel tunnel/pinched nerve   DIALYSIS/PERMA CATHETER INSERTION N/A 12/20/2023   Procedure: DIALYSIS/PERMA CATHETER INSERTION;  Surgeon: Tobie Gordy POUR, MD;  Location: [facility] INVASIVE CV LAB;  Service: Cardiovascular;  Laterality: N/A;   WISDOM TOOTH EXTRACTION      Allergies  Allergen Reactions   Levofloxacin Hives    Prior to Admission medications   Medication Sig Start Date End Date Taking? Authorizing Provider  albuterol  (VENTOLIN  HFA) 108 (90 Base) MCG/ACT inhaler Inhale 1-2 puffs into the lungs every 6 (six) hours as needed for wheezing or shortness of breath. 03/24/24   Henderly, Britni A, PA-C  amLODipine  (NORVASC ) 10 MG tablet Take 10 mg by mouth every  morning.    [provider]  doxycycline  (VIBRAMYCIN ) 100 MG capsule Take 1 capsule (100 mg total) by mouth 2 (two) times daily. 03/24/24   Henderly, Britni A, PA-C  furosemide  (LASIX ) 80 MG tablet Take 80 mg by mouth daily.    [provider]  glucose blood (ACCU-CHEK AVIVA PLUS) test strip Use as instructed Patient taking differently: 1 each by Other route as directed. Use as instructed 01/06/16   Haney, Mardy A, MD  glucose blood (ACCU-CHEK SMARTVIEW) test strip Test blood sugar once daily Patient taking differently: 1 each by Other route See admin instructions. Test blood sugar once daily 10/12/16   Haney, Alyssa A, MD  glucose blood test strip Use as instructed Patient taking differently: 1 each by Other route as directed. Use as instructed 12/29/15   Jamey Mardy A, MD  hydrALAZINE  (APRESOLINE ) 25 MG tablet Take 25 mg by mouth in the morning and at bedtime. 08/09/20   [provider]  insulin  glargine, 2 Unit Dial , (TOUJEO MAX SOLOSTAR) 300 UNIT/ML Solostar Pen Inject 10 Units into the skin daily as needed (high blood sugar). 06/02/21   [provider]  insulin  lispro (HUMALOG ) 100 UNIT/ML KwikPen Inject 0.01-0.09 mLs (1-9 Units total) into the skin 3 (three) times daily before meals. Blood sugars 70-120: no Insulin  121-150: 2 units 151-200: 3 units 201-250: 4 units 251-300: 5 units 301-350:  6 units 351-400: 9 units More than 400 use 9 units and call your doctor 08/09/20   Raenelle Coria, MD  Iron-Vitamin C 100-250 MG TABS Take 1 tablet by mouth in the morning and at bedtime.    [provider]  Lancets [facility] ULTRASOFT) lancets Use as instructed Patient taking differently: 1 each by Other route as directed. Use as instructed 08/27/15   Jamey Fish A, MD  lidocaine  (LIDODERM ) 5 % Place 1 patch onto the skin daily. Remove & Discard patch within 12 hours or as directed by MD 09/27/23   Henderly, Britni A, PA-C  losartan  (COZAAR ) 25 MG tablet Take 25 mg  by mouth daily. 10/05/20   [provider]  methocarbamol  (ROBAXIN ) 500 MG tablet Take 1 tablet (500 mg total) by mouth daily. Patient taking differently: Take 500 mg by mouth daily as needed. 09/27/23   Henderly, Britni A, PA-C  OVER THE COUNTER MEDICATION Take 1 tablet by mouth 2 (two) times daily. For Leg Cramps  Patient is no longer taking this medication    [provider]  oxyCODONE -acetaminophen  (PERCOCET/ROXICET) 5-325 MG tablet Take 1 tablet by mouth every 6 (six) hours as needed. 03/04/24   Gerome Maurilio HERO, PA-C  OZEMPIC, 2 MG/DOSE, 8 MG/3ML SOPN Inject 2 mg into the skin once a week. Takes on friday    [provider]  PARoxetine  (PAXIL ) 10 MG tablet TAKE 1 TABLET BY MOUTH AT BEDTIME 07/14/24   Stinson, Jacob J, DO  PRESCRIPTION MEDICATION IRON INFUSIONS as needed. [facility]).    [provider]  propranolol (INDERAL) 40 MG tablet Take 40 mg by mouth 2 (two) times daily.    [provider]  traMADol  (ULTRAM ) 50 MG tablet Take 1 tablet (50 mg total) by mouth every 6 (six) hours as needed. 12/20/23 12/19/24  Tobie Gordy POUR, MD  traZODone  (DESYREL ) 100 MG tablet Take 100 mg by mouth at bedtime. 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  Divorced  4 kids. Numerous grandkids, and great grankids.           Dialysis M-W-F [address]   Right hand dominant   Social Drivers of Health   Financial Resource Strain: Medium Risk (11/05/2020)   Received from [facility]   Overall Financial Resource Strain (CARDIA)    Difficulty of Paying Living Expenses: Somewhat hard  Food Insecurity: No Food Insecurity (10/12/2022)   Received from [facility]   Hunger Vital Sign    Within the past 12 months, you worried that your food would run out before you got the money to buy more.: Never true    Within the past 12 months, the  food you bought just didn't last and you didn't have money to get more.: Never true  Transportation Needs: No Transportation Needs (11/05/2020)   Received from [facility]   PRAPARE - Transportation    Lack of Transportation (Medical): No    Lack of Transportation (Non-Medical): No  Physical Activity: Sufficiently Active (11/05/2020)   Received from [facility]   Exercise Vital Sign    On average, how many days per week do you engage in moderate to strenuous exercise (like a brisk walk)?: 7 days    On average, how many minutes do you engage in exercise at this level?: 30 min  Stress: No Stress Concern Present (11/05/2020)   Received from [facility] - Occupational Stress Questionnaire    Feeling of Stress : Not at all  Social Connections: Unknown (05/01/2022)   Received from [facility]   Social Network    Social Network: Not on file  Intimate Partner Violence: Unknown (03/23/2022)   Received from [facility]   HITS    Physically Hurt: Not on file    Insult or Talk Down To: Not on file    Threaten Physical Harm: Not on file    Scream or Curse: Not on file     Family History  Problem Relation Age of Onset   Early death Mother 30       shot    Heart disease Father 79   Diabetes Daughter    Kidney disease Daughter        on dialysis    Colon polyps  Daughter    Diabetes Maternal Aunt    Cancer Paternal Uncle        Breast Cancer    Breast cancer Maternal Grandmother    Colon cancer Neg Hx    Esophageal cancer Neg Hx    Stomach cancer Neg Hx    Rectal cancer Neg Hx     ROS: [x]  Positive   [ ]  Negative   [ ]  All sytems reviewed and are negative  Cardiovascular: []  chest pain/pressure []  palpitations []  SOB lying flat []  DOE []  pain in legs while walking []  pain in legs at rest []  pain in legs at night []  non-healing ulcers []  hx of DVT []  swelling in legs  Pulmonary: []  productive cough []  asthma/wheezing []  home O2  Neurologic: []  weakness in []  arms []  legs []  numbness in []  arms []  legs []  hx of CVA []  mini stroke [] difficulty speaking or slurred speech []  temporary loss of vision in one eye []  dizziness  Hematologic: []  hx of cancer []  bleeding problems []  problems with blood clotting easily  Endocrine:   []  diabetes []  thyroid  disease  GI []  vomiting blood []  blood in stool  GU: []  CKD/renal failure []  HD--[]  M/W/F or []  T/T/S []  burning with urination []  blood in urine  Psychiatric: []  anxiety []  depression  Musculoskeletal: []  arthritis []  joint pain  Integumentary: []  rashes []  ulcers  Constitutional: []  fever []  chills   Physical Examination  Vitals:   08/22/24 0728  BP: (!) 125/53  Pulse: 80  Resp: 14  SpO2: 99%   There is no height or weight on file to calculate BMI.  General:  WDWN Gait: Not observed HENT: WNL, normocephalic Pulmonary: normal non-labored breathing Cardiac: regular, without  Murmurs, rubs or gallops Abdomen:  soft, NT/ND, no masses Vascular Exam/Pulses: Right arm brachiocephalic fistula pulsatile Musculoskeletal: no muscle wasting or atrophy  Neurologic:  A&O X 3; Appropriate Affect ; SENSATION: normal; MOTOR FUNCTION:  moving all extremities equally. Speech is fluent/normal   CBC    Component Value Date/Time   WBC 7.6 03/24/2024 2101   RBC  3.18 (L) 03/24/2024 2101   HGB 9.6 (L) 03/24/2024 2101   HCT 30.2 (L) 03/24/2024 2101   HCT 32.9 (L) 03/07/2018 1049   PLT 294 03/24/2024 2101   MCV 95.0 03/24/2024 2101   MCH 30.2 03/24/2024 2101   MCHC 31.8 03/24/2024 2101   RDW 17.8 (H) 03/24/2024 2101   LYMPHSABS 2.3 03/24/2024 2101   MONOABS 0.6 03/24/2024 2101   EOSABS 0.2 03/24/2024 2101   BASOSABS 0.0 03/24/2024 2101    BMET    Component Value Date/Time   NA 137 03/24/2024 2101   K 3.5 03/24/2024 2101   CL 96 (L) 03/24/2024 2101   CO2 30 03/24/2024 2101   GLUCOSE 108 (H) 03/24/2024 2101   BUN 17 03/24/2024 2101   CREATININE 3.67 (H) 03/24/2024 2101   CREATININE 0.70 12/21/2014 1609   CALCIUM 8.1 (L) 03/24/2024 2101   CALCIUM 9.5 05/29/2022 1033   GFRNONAA 12 (L) 03/24/2024 2101   GFRAA 8 (L) 08/14/2020 0743    COAGS: Lab Results  Component Value Date   INR 0.86 03/06/2018     Non-Invasive Vascular Imaging:      ASSESSMENT/PLAN: This is a 73 y.o. female  with end-stage renal disease that presents for fistulogram due to malfunction of right arm AV fistula.  Currently using a right arm brachiocephalic AV fistula placed on 6/81/7974 by Dr. Lanis.  Discussed plan for right arm fistulogram with possible intervention.  All questions answered.  Lonni DOROTHA Gaskins, MD [facility] Office: [phone]  Lonni JINNY Gaskins

## 2024-08-22 NOTE — Op Note (Signed)
    Patient name: Veronica Simmons MRN: [id number] DOB: 05/26/1951 Sex: female  08/22/2024 Pre-operative Diagnosis: Malfunctioning right brachiocephalic AV fistula Post-operative diagnosis:  Same Surgeon:  Lonni DOROTHA Gaskins, MD Procedure Performed: 1.  Ultrasound-guided access right brachiocephalic AV fistula 2.  Right upper extremity fistulogram with central venogram 3.  Peripheral angioplasty right cephalic vein fistula in the mid upper arm (6 mm and 7 mm Mustang)  Indications: Patient is a 73 year old female with end-stage renal disease using a malfunctioning right arm AV fistula.  She presents for right arm fistulogram after risk-benefits discussed.  Findings:   Ultrasound-guided access right arm brachiocephalic AV fistula.  No evidence of central venous stenosis.  In the mid upper arm cephalic vein there was about a 70% stenosis that was focal like a napkin ring.  I initially tried to use a 7 mm Mustang to nominal pressure and could not get this to expand.  I then used a 6 mm x 20 mm Mustang for more radial force and was able to get this stenosis opened.  There was a small perforation.  We did a prolonged low inflation and this perforation resolved.  There was no hematoma in the arm.  Excellent thrill. I elected not to stent given this was the cannulation zone.   Procedure:  The patient was identified in the holding area and taken to [facility] lab.  Placed on the table in supine position.  The right arm was prepped draped standard sterile fashion.  Timeout was performed.  I used ultrasound to evaluate the right arm fistula.  This was accessed with micro access needle and placed a microwire and a micro sheath.  We then got a right upper extremity fistulogram including central venogram.  I elected for intervention on the mid upper arm cephalic vein stenosis.  Used a Holiday representative and exchanged for a short 6 [place] sheath.  I then treated the mid upper arm cephalic vein stenosis with a 7 mm x  60 mm Mustang to nominal pressure.  This napkin ring stenosis did not expand.  I elected to use a smaller balloon using a 6 mm x 20 mm Mustang and I was able to get this stenosis to open appropriately.  There was a small perforation on repeat injection.  I did a prolonged balloon inflation with a 7 mm balloon for 2 minutes.  Perforation sealed.  There was no arm hematoma.  Elected not to stent given the cannulation zone.  Wires and catheters removed.  Sheath was removed while tying down a pursestring.  Taken to holding in stable condition with excellent thrill.     Lonni DOROTHA Gaskins, MD [facility] [place] Office: [phone]

## 2024-08-24 ENCOUNTER — Encounter (HOSPITAL_COMMUNITY): Payer: Self-pay | Admitting: Vascular Surgery

## 2024-10-22 ENCOUNTER — Other Ambulatory Visit (HOSPITAL_COMMUNITY): Payer: Self-pay | Admitting: Nephrology

## 2024-10-22 DIAGNOSIS — N186 End stage renal disease: Secondary | ICD-10-CM

## 2024-10-28 ENCOUNTER — Ambulatory Visit (HOSPITAL_COMMUNITY)
Admission: RE | Admit: 2024-10-28 | Discharge: 2024-10-28 | Disposition: A | Discharge status: Home / Self Care | Source: Ambulatory Visit | Attending: Nephrology | Admitting: Nephrology

## 2024-10-28 DIAGNOSIS — Z4901 Encounter for fitting and adjustment of extracorporeal dialysis catheter: Secondary | ICD-10-CM | POA: Diagnosis present

## 2024-10-28 DIAGNOSIS — N186 End stage renal disease: Secondary | ICD-10-CM | POA: Diagnosis not present

## 2024-10-28 HISTORY — PX: IR REMOVAL TUN CV CATH W/O FL: IMG2289

## 2024-10-28 MED ORDER — LIDOCAINE-EPINEPHRINE 1 %-1:100000 IJ SOLN
INTRAMUSCULAR | Status: AC
  Filled 2024-10-28: qty 1

## 2024-12-21 ENCOUNTER — Ambulatory Visit
Admission: EM | Admit: 2024-12-21 | Discharge: 2024-12-21 | Disposition: A | Discharge status: Home / Self Care | Attending: Student | Admitting: Student

## 2024-12-21 DIAGNOSIS — J209 Acute bronchitis, unspecified: Secondary | ICD-10-CM | POA: Diagnosis not present

## 2024-12-21 LAB — POCT INFLUENZA A/B
Influenza A, POC: NEGATIVE
Influenza B, POC: NEGATIVE

## 2024-12-21 LAB — POC SOFIA SARS ANTIGEN FIA: SARS Coronavirus 2 Ag: NEGATIVE

## 2024-12-21 MED ORDER — DOXYCYCLINE HYCLATE 100 MG PO CAPS: 100.0000 mg | ORAL_CAPSULE | Freq: Two times a day (BID) | ORAL | 0 refills | Status: AC

## 2024-12-21 MED ORDER — PREDNISONE 20 MG PO TABS: 40.0000 mg | ORAL_TABLET | Freq: Every day | ORAL | 0 refills | Status: AC

## 2024-12-21 NOTE — ED Provider Notes (Signed)
 " [facility]    CSN: [id number] Arrival date & time: 12/21/24  1248      History   Chief Complaint Chief Complaint  Patient presents with   Cough    HPI Veronica Simmons is a 74 y.o. female presenting on day 3 of viral syndrome.  ESRD on dialysis. -Ms. Pillard presents with cough and chills that started on Friday.  -Cough is productive of yellow sputum -Denies SOB -Treated with Nyquil with minimal relief.   -Sugars running low 100s at home  The patient denies a history of pulmonary disease  Accompanied by family member today.  HPI  Past Medical History:  Diagnosis Date   ALLERGIC RHINITIS 02/19/2007   Qualifier: Diagnosis of  By: FRANCO GASKINS MD, VALENICA   Anemia 1970s   Hx of iron infursions and pt currently on iron pills   Arthritis    Chronic kidney disease    dialysis M-W-F   Cubital tunnel syndrome on right 2020   Secondary to old fracture. Evaluated by ortho. [facility] offered surgery but patient refused. Numbness and burning pain 4th and 5th digit.     Daytime sleepiness    Diabetes mellitus 2000   type 2   High cholesterol    Hypertension 2000   Leg cramps    Myocardial infarction (HCC)    02/2018   Pneumonia    x 1    Patient Active Problem List   Diagnosis Date Noted   Symptomatic anemia 08/13/2020   Generalized weakness 08/13/2020   Pneumonia due to COVID-19 virus 08/03/2020   Acute respiratory failure with hypoxemia (HCC) 08/03/2020   AKI (acute kidney injury) 08/03/2020   Hyponatremia 08/03/2020   New onset of congestive heart failure (HCC) 10/17/2019   Hypertensive urgency 10/17/2019   Right sided weakness 03/07/2018   HTN (hypertension) 03/07/2018   Obesity (BMI 30-39.9) 03/07/2018   Skin papules, generalized 08/04/2016   Skin lesion of right lower limb 06/19/2016   Leg swelling 05/12/2016   Subconjunctival hemorrhage of right eye 04/27/2016   Diarrhea 01/09/2016   Health care maintenance 06/28/2015    Anemia 12/21/2014   Palpitations 01/07/2014   Recurrent yeast vaginitis 11/05/2013   Recurrent genital herpes 07/17/2013   Hot flashes not due to menopause 11/27/2012   Cubital tunnel syndrome on right 10/31/2012   Type 2 diabetes, controlled, 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   Hyperlipidemia 02/14/2007   OBESITY, NOS 02/14/2007   HYPERTENSION, BENIGN SYSTEMIC 02/14/2007   Arthropathy 02/14/2007   Insomnia 02/14/2007    Past Surgical History:  Procedure Laterality Date   A/V SHUNT INTERVENTION Right 08/22/2024   Procedure: A/V SHUNT INTERVENTION;  Surgeon: Gaskins Lonni PARAS, MD;  Location: [facility];  Service: Cardiovascular;  Laterality: Right;   AV FISTULA PLACEMENT Right 03/04/2024   Procedure: RIGHT ARTERIOVENOUS (AV) FISTULA CREATION;  Surgeon: Lanis Fonda BRAVO, MD;  Location: [facility];  Service: Vascular;  Laterality: Right;  Peripheral Nerve Block   COLONOSCOPY     DG HAND RIGHT COMPLETE ([facility] HX) Right 2020   right hand/wrist/elbow surgery to fix carpel tunnel/pinched nerve   DIALYSIS/PERMA CATHETER INSERTION N/A 12/20/2023   Procedure: DIALYSIS/PERMA CATHETER INSERTION;  Surgeon: Tobie Gordy POUR, MD;  Location: [facility] INVASIVE CV LAB;  Service: Cardiovascular;  Laterality: N/A;   IR REMOVAL TUN CV CATH W/O FL  10/28/2024   VENOUS ANGIOPLASTY  08/22/2024   Procedure: VENOUS ANGIOPLASTY;  Surgeon: Gaskins Lonni PARAS, MD;  Location: [facility];  Service: Cardiovascular;;  cephalic   WISDOM TOOTH EXTRACTION      OB History     Gravida  4   Para  4   Term  4   Preterm      AB      Living  4      SAB      IAB      Ectopic      Multiple      Live Births  4            Home Medications    Prior to Admission medications  Medication Sig Start Date End Date Taking? Authorizing Provider  doxycycline  (VIBRAMYCIN ) 100 MG capsule Take 1 capsule (100 mg total) by mouth 2 (two) times daily for 7 days. 12/21/24 12/28/24 Yes [place] Souter E, PA-C  predniSONE  (DELTASONE )  20 MG tablet Take 2 tablets (40 mg total) by mouth daily for 5 days. Take with breakfast or lunch. Avoid NSAIDs (ibuprofen , etc) while taking this medication. 12/21/24 12/26/24 Yes Gabriella Guile E, PA-C  albuterol  (VENTOLIN  HFA) 108 (90 Base) MCG/ACT inhaler Inhale 1-2 puffs into the lungs every 6 (six) hours as needed for wheezing or shortness of breath. 03/24/24   Henderly, Britni A, PA-C  amLODipine  (NORVASC ) 10 MG tablet Take 10 mg by mouth every morning.    [provider]  furosemide  (LASIX ) 80 MG tablet Take 80 mg by mouth daily.    [provider]  hydrALAZINE  (APRESOLINE ) 25 MG tablet Take 25 mg by mouth in the morning and at bedtime. 08/09/20   [provider]  insulin  glargine, 2 Unit Dial , (TOUJEO MAX SOLOSTAR) 300 UNIT/ML Solostar Pen Inject 10 Units into the skin daily as needed (high blood sugar). 06/02/21   [provider]  insulin  lispro (HUMALOG ) 100 UNIT/ML KwikPen Inject 0.01-0.09 mLs (1-9 Units total) into the skin 3 (three) times daily before meals. Blood sugars 70-120: no Insulin  121-150: 2 units 151-200: 3 units 201-250: 4 units 251-300: 5 units 301-350: 6 units 351-400: 9 units More than 400 use 9 units and call your doctor 08/09/20   Ghimire, Kuber, MD  Iron-Vitamin C 100-250 MG TABS Take 1 tablet by mouth in the morning and at bedtime.    [provider]  lidocaine  (LIDODERM ) 5 % Place 1 patch onto the skin daily. Remove & Discard patch within 12 hours or as directed by MD 09/27/23   Henderly, Britni A, PA-C  losartan  (COZAAR ) 25 MG tablet Take 25 mg by mouth daily. 10/05/20   [provider]  OVER THE COUNTER MEDICATION Take 1 tablet by mouth 2 (two) times daily. For Leg Cramps  Patient is no longer taking this medication    [provider]  oxyCODONE -acetaminophen  (PERCOCET/ROXICET) 5-325 MG tablet Take 1 tablet by mouth every 6 (six) hours as needed. 03/04/24   Gerome Maurilio HERO, PA-C  OZEMPIC, 2 MG/DOSE, 8 MG/3ML SOPN  Inject 2 mg into the skin once a week. Takes on friday    [provider]  PARoxetine  (PAXIL ) 10 MG tablet TAKE 1 TABLET BY MOUTH AT BEDTIME 07/14/24   Stinson, Jacob J, DO  PRESCRIPTION MEDICATION IRON INFUSIONS as needed. [facility]).    [provider]  propranolol (INDERAL) 40 MG tablet Take 40 mg by mouth 2 (two) times daily.    [provider]  traZODone  (DESYREL ) 100 MG tablet Take 100 mg by mouth at bedtime. 03/18/20   [provider]    Family History Family History  Problem  Relation Age of Onset   Early death Mother 41       shot    Heart disease Father 33   Diabetes Daughter    Kidney disease Daughter        on dialysis    Colon polyps Daughter    Diabetes Maternal Aunt    Cancer Paternal Uncle        Breast Cancer    Breast cancer Maternal Grandmother    Colon cancer Neg Hx    Esophageal cancer Neg Hx    Stomach cancer Neg Hx    Rectal cancer Neg Hx     Social History Social History[1]   Allergies   Levofloxacin   Review of Systems Review of Systems  Constitutional:  Positive for chills. Negative for appetite change and fever.  HENT:  Positive for congestion. Negative for ear pain, rhinorrhea, sinus pressure, sinus pain and sore throat.   Eyes:  Negative for redness and visual disturbance.  Respiratory:  Positive for cough. Negative for chest tightness, shortness of breath and wheezing.   Cardiovascular:  Negative for chest pain and palpitations.  Gastrointestinal:  Negative for abdominal pain, constipation, diarrhea, nausea and vomiting.  Genitourinary:  Negative for dysuria, frequency and urgency.  Musculoskeletal:  Negative for myalgias.  Neurological:  Negative for dizziness, weakness and headaches.  Psychiatric/Behavioral:  Negative for confusion.   All other systems reviewed and are negative.    Physical Exam Triage Vital Signs ED Triage Vitals  Encounter Vitals Group     BP --      Girls Systolic BP Percentile --       Girls Diastolic BP Percentile --      Boys Systolic BP Percentile --      Boys Diastolic BP Percentile --      Pulse --      Resp --      Temp --      Temp src --      SpO2 --      Weight 12/21/24 1446 205 lb (93 kg)     Height 12/21/24 1446 5' 5.5 (1.664 m)     Head Circumference --      Peak Flow --      Pain Score 12/21/24 1445 6     Pain Loc --      Pain Education --      Exclude from Growth Chart --    No data found.  Updated Vital Signs BP (!) 166/76 (BP Location: Right Arm) Comment: did not take bp meds today  Pulse 87   Temp 100 F (37.8 C) (Oral)   Ht 5' 5.5 (1.664 m)   Wt 205 lb (93 kg)   SpO2 97%   BMI 33.59 kg/m   Visual Acuity Right Eye Distance:   Left Eye Distance:   Bilateral Distance:    Right Eye Near:   Left Eye Near:    Bilateral Near:     Physical Exam Vitals reviewed.  Constitutional:      General: She is not in acute distress.    Appearance: Normal appearance. She is ill-appearing.  HENT:     Head: Normocephalic and atraumatic.     Right Ear: Tympanic membrane, ear canal and external ear normal. No tenderness. No middle ear effusion. There is no impacted cerumen. Tympanic membrane is not perforated, erythematous, retracted or bulging.     Left Ear: Tympanic membrane, ear canal and external ear normal. No tenderness.  No middle ear effusion.  There is no impacted cerumen. Tympanic membrane is not perforated, erythematous, retracted or bulging.     Nose: Nose normal. No congestion.     Mouth/Throat:     Mouth: Mucous membranes are moist.     Pharynx: Uvula midline. No oropharyngeal exudate or posterior oropharyngeal erythema.     Tonsils: No tonsillar exudate.  Eyes:     Extraocular Movements: Extraocular movements intact.     Pupils: Pupils are equal, round, and reactive to light.  Cardiovascular:     Rate and Rhythm: Normal rate and regular rhythm.     Heart sounds: Normal heart sounds.  Pulmonary:     Effort: Pulmonary effort  is normal.     Breath sounds: Normal breath sounds. No decreased breath sounds, wheezing, rhonchi or rales.  Abdominal:     Palpations: Abdomen is soft.     Tenderness: There is no abdominal tenderness. There is no guarding or rebound.  Lymphadenopathy:     Cervical: No cervical adenopathy.     Right cervical: No superficial, deep or posterior cervical adenopathy.    Left cervical: No superficial, deep or posterior cervical adenopathy.  Skin:    Comments: No rash   Neurological:     General: No focal deficit present.     Mental Status: She is alert and oriented to person, place, and time.  Psychiatric:        Mood and Affect: Mood normal.        Behavior: Behavior normal.        Thought Content: Thought content normal.        Judgment: Judgment normal.      UC Treatments / Results  Labs (all labs ordered are listed, but only abnormal results are displayed) Labs Reviewed  POCT INFLUENZA A/B  POC SOFIA SARS ANTIGEN FIA    EKG   Radiology No results found.  Procedures Procedures (including critical care time)  Medications Ordered in UC Medications - No data to display  Initial Impression / Assessment and Plan / UC Course  I have reviewed the triage vital signs and the nursing notes.  Pertinent labs & imaging results that were available during my care of the patient were reviewed by me and considered in my medical decision making (see chart for details).     Patient is a pleasant 74 y.o. female presenting with viral syndrome x3-4 days. The patient is afebrile and nontachycardic.  Antipyretic has not been administered today. ESRD on dialysis.  On exam, lungs are clear to auscultation.  -Covid negative -Influenza negative  Following discussion, will cover for bronchitis and pneumonia with doxycycline  and prednisone .  The patient will let dialysis center know that she is taking these medications tomorrow, at her dialysis appointment.  We discussed the option of a cough  syrup, but unfortunately given ESRD, it would not be safe to prescribe codeine-based cough syrup or Promethazine  DM.  The patient verbalizes understanding and agreement.  Return precautions as below.  Final Clinical Impressions(s) / UC Diagnoses   Final diagnoses:  Acute bronchitis, unspecified organism     Discharge Instructions      -We're treating your lung inflammation with an antibiotic and a steroid -Doxycycline  twice daily for 7 days.  Make sure to wear sunscreen while spending time outside while on this medication as it can increase your chance of sunburn. You can take this medication with food if you have a sensitive stomach. At dialysis tomorrow, let them know that you are taking this antibiotic,  before they give the iron supplement.  -Prednisone , 2 pills taken at the same time for 5 days in a row.  Try taking this earlier in the day as it can give you energy. Avoid NSAIDs like ibuprofen  and alleve while taking this medication as they can increase your risk of stomach upset and even GI bleeding when in combination with a steroid. You can continue tylenol  (acetaminophen ) up to 1000mg  3x daily. -Your cough should slowly get better instead of worse. If you develop a cough productive of dark or red sputum, new shortness of breath, new chest tightness, new fevers, etc - seek additional care.      ED Prescriptions     Medication Sig Dispense Auth. Provider   predniSONE  (DELTASONE ) 20 MG tablet Take 2 tablets (40 mg total) by mouth daily for 5 days. Take with breakfast or lunch. Avoid NSAIDs (ibuprofen , etc) while taking this medication. 10 tablet Ernst Cumpston E, PA-C   doxycycline  (VIBRAMYCIN ) 100 MG capsule Take 1 capsule (100 mg total) by mouth 2 (two) times daily for 7 days. 14 capsule Zahra Peffley E, PA-C      PDMP not reviewed this encounter.     [1]  Social History Tobacco Use   Smoking status: Never   Smokeless tobacco: Never  Vaping Use   Vaping status: Never Used   Substance Use Topics   Alcohol use: No   Drug use: No     Arlyss Leita BRAVO, PA-C 12/21/24 1538  "

## 2024-12-21 NOTE — ED Triage Notes (Signed)
 Veronica Simmons presents with cough and chills that started on Friday. Treated with Nyquil with minimal relief.

## 2024-12-21 NOTE — Discharge Instructions (Addendum)
-  We're treating your lung inflammation with an antibiotic and a steroid -Doxycycline  twice daily for 7 days.  Make sure to wear sunscreen while spending time outside while on this medication as it can increase your chance of sunburn. You can take this medication with food if you have a sensitive stomach. At dialysis tomorrow, let them know that you are taking this antibiotic, before they give the iron supplement.  -Prednisone , 2 pills taken at the same time for 5 days in a row.  Try taking this earlier in the day as it can give you energy. Avoid NSAIDs like ibuprofen  and alleve while taking this medication as they can increase your risk of stomach upset and even GI bleeding when in combination with a steroid. You can continue tylenol  (acetaminophen ) up to 1000mg  3x daily. -Your cough should slowly get better instead of worse. If you develop a cough productive of dark or red sputum, new shortness of breath, new chest tightness, new fevers, etc - seek additional care.

## 2025-01-07 ENCOUNTER — Encounter (HOSPITAL_COMMUNITY): Payer: Self-pay

## 2025-01-29 ENCOUNTER — Ambulatory Visit: Admitting: Podiatry
